# Patient Record
Sex: Female | Born: 1950 | Race: White | Hispanic: No | Smoking: Never smoker
Health system: Southern US, Community
[De-identification: ages and names within clinical notes are randomized; demographics above are authoritative.]

## PROBLEM LIST (undated history)

## (undated) DIAGNOSIS — E119 Type 2 diabetes mellitus without complications: Secondary | ICD-10-CM

## (undated) DIAGNOSIS — Z8669 Personal history of other diseases of the nervous system and sense organs: Secondary | ICD-10-CM

## (undated) DIAGNOSIS — K219 Gastro-esophageal reflux disease without esophagitis: Secondary | ICD-10-CM

## (undated) DIAGNOSIS — I10 Essential (primary) hypertension: Secondary | ICD-10-CM

## (undated) DIAGNOSIS — Z8709 Personal history of other diseases of the respiratory system: Secondary | ICD-10-CM

## (undated) DIAGNOSIS — F419 Anxiety disorder, unspecified: Secondary | ICD-10-CM

## (undated) DIAGNOSIS — E039 Hypothyroidism, unspecified: Secondary | ICD-10-CM

## (undated) DIAGNOSIS — G4730 Sleep apnea, unspecified: Secondary | ICD-10-CM

## (undated) DIAGNOSIS — M2540 Effusion, unspecified joint: Secondary | ICD-10-CM

## (undated) DIAGNOSIS — G629 Polyneuropathy, unspecified: Secondary | ICD-10-CM

## (undated) DIAGNOSIS — J302 Other seasonal allergic rhinitis: Secondary | ICD-10-CM

## (undated) DIAGNOSIS — R251 Tremor, unspecified: Secondary | ICD-10-CM

## (undated) DIAGNOSIS — R0600 Dyspnea, unspecified: Secondary | ICD-10-CM

## (undated) DIAGNOSIS — Z8719 Personal history of other diseases of the digestive system: Secondary | ICD-10-CM

## (undated) DIAGNOSIS — R32 Unspecified urinary incontinence: Secondary | ICD-10-CM

## (undated) DIAGNOSIS — R112 Nausea with vomiting, unspecified: Secondary | ICD-10-CM

## (undated) DIAGNOSIS — Z950 Presence of cardiac pacemaker: Secondary | ICD-10-CM

## (undated) DIAGNOSIS — G2581 Restless legs syndrome: Secondary | ICD-10-CM

## (undated) DIAGNOSIS — E785 Hyperlipidemia, unspecified: Secondary | ICD-10-CM

## (undated) DIAGNOSIS — C4491 Basal cell carcinoma of skin, unspecified: Secondary | ICD-10-CM

## (undated) DIAGNOSIS — Z9889 Other specified postprocedural states: Secondary | ICD-10-CM

## (undated) DIAGNOSIS — Z87442 Personal history of urinary calculi: Secondary | ICD-10-CM

## (undated) DIAGNOSIS — M1990 Unspecified osteoarthritis, unspecified site: Secondary | ICD-10-CM

## (undated) DIAGNOSIS — I5033 Acute on chronic diastolic (congestive) heart failure: Secondary | ICD-10-CM

## (undated) DIAGNOSIS — J45909 Unspecified asthma, uncomplicated: Secondary | ICD-10-CM

## (undated) HISTORY — DX: Basal cell carcinoma of skin, unspecified: C44.91

## (undated) HISTORY — DX: Polyneuropathy, unspecified: G62.9

## (undated) HISTORY — PX: BASAL CELL CARCINOMA EXCISION: SHX1214

## (undated) HISTORY — PX: CARPAL TUNNEL RELEASE: SHX101

## (undated) HISTORY — DX: Restless legs syndrome: G25.81

## (undated) HISTORY — DX: Hyperlipidemia, unspecified: E78.5

## (undated) HISTORY — DX: Anxiety disorder, unspecified: F41.9

## (undated) HISTORY — PX: ACHILLES TENDON REPAIR: SUR1153

## (undated) HISTORY — DX: Hypothyroidism, unspecified: E03.9

## (undated) HISTORY — DX: Type 2 diabetes mellitus without complications: E11.9

## (undated) HISTORY — PX: KNEE ARTHROSCOPY: SUR90

## (undated) HISTORY — DX: Tremor, unspecified: R25.1

## (undated) HISTORY — PX: DILATION AND CURETTAGE OF UTERUS: SHX78

## (undated) HISTORY — DX: Sleep apnea, unspecified: G47.30

## (undated) HISTORY — PX: CARDIAC CATHETERIZATION: SHX172

## (undated) HISTORY — DX: Unspecified urinary incontinence: R32

## (undated) HISTORY — PX: APPENDECTOMY: SHX54

## (undated) HISTORY — PX: BREAST LUMPECTOMY: SHX2

## (undated) HISTORY — DX: Essential (primary) hypertension: I10

## (undated) HISTORY — PX: EYE SURGERY: SHX253

## (undated) HISTORY — PX: TOTAL ABDOMINAL HYSTERECTOMY: SHX209

---

## 1998-02-14 ENCOUNTER — Ambulatory Visit (HOSPITAL_COMMUNITY): Admission: RE | Admit: 1998-02-14 | Discharge: 1998-02-14 | Payer: Self-pay | Admitting: Cardiology

## 1999-03-21 ENCOUNTER — Other Ambulatory Visit: Admission: RE | Admit: 1999-03-21 | Discharge: 1999-03-21 | Payer: Self-pay | Admitting: Obstetrics and Gynecology

## 1999-03-24 ENCOUNTER — Other Ambulatory Visit: Admission: RE | Admit: 1999-03-24 | Discharge: 1999-03-24 | Payer: Self-pay | Admitting: Obstetrics and Gynecology

## 1999-05-14 ENCOUNTER — Inpatient Hospital Stay (HOSPITAL_COMMUNITY): Admission: RE | Admit: 1999-05-14 | Discharge: 1999-05-17 | Payer: Self-pay | Admitting: Obstetrics and Gynecology

## 1999-09-25 ENCOUNTER — Encounter: Payer: Self-pay | Admitting: *Deleted

## 1999-09-25 ENCOUNTER — Ambulatory Visit (HOSPITAL_COMMUNITY): Admission: RE | Admit: 1999-09-25 | Discharge: 1999-09-25 | Payer: Self-pay | Admitting: *Deleted

## 2001-10-03 ENCOUNTER — Ambulatory Visit: Admission: RE | Admit: 2001-10-03 | Discharge: 2001-10-03 | Payer: Self-pay | Admitting: Orthopedic Surgery

## 2011-10-20 HISTORY — PX: LITHOTRIPSY: SUR834

## 2015-01-16 ENCOUNTER — Encounter: Payer: Self-pay | Admitting: Diagnostic Neuroimaging

## 2015-01-16 ENCOUNTER — Ambulatory Visit (INDEPENDENT_AMBULATORY_CARE_PROVIDER_SITE_OTHER): Payer: PPO | Admitting: Diagnostic Neuroimaging

## 2015-01-16 VITALS — BP 120/61 | HR 69 | Temp 98.0°F | Resp 18

## 2015-01-16 DIAGNOSIS — G250 Essential tremor: Secondary | ICD-10-CM | POA: Diagnosis not present

## 2015-01-16 MED ORDER — GABAPENTIN 400 MG PO CAPS: 400.0000 mg | ORAL_CAPSULE | Freq: Three times a day (TID) | ORAL | Status: DC

## 2015-01-16 NOTE — Patient Instructions (Signed)
Increase gabapentin tup to 800mg  three times per day.

## 2015-01-16 NOTE — Progress Notes (Signed)
[facility]  PATIENT: Linda Washington DOB: 1950/11/26  REFERRING CLINICIAN: Wilhemena Durie, PA HISTORY FROM: patient and roommate REASON FOR VISIT: new consult    HISTORICAL  CHIEF COMPLAINT:  Chief Complaint  Patient presents with  . Tremors    Rm 6 New patient     HISTORY OF PRESENT ILLNESS:   64 year old right-handed female here for valuation of tremor. He has history of hypertension, diabetes, hypercholesterolemia, anxiety. Patient referred for consultation by Wilhemena Durie, PA.  Patient reports onset of gradually progressive hand tremor, symmetric, since 2009. Symptoms mainly affect her when she is holding her arms out or doing fine movements with her fingers. No resting tremor. No trouble with cups, spoons or other utensils. Her handwriting has deteriorated. No speech or swallowing difficulties, balance or gait difficulties, constipation or REM behavior sleep problems. She has history of sleep apnea diagnosed several years ago, is not able to tolerate CPAP.   No family history of tremor. No specific triggering or aggravating factors. Wine may slightly improve tremor.   REVIEW OF SYSTEMS: Full 14 system review of systems performed and notable only foranxiety not asleep decreased energy tremor snoring easy bruising urination problems moles cramps joint pain.  ALLERGIES: Allergies  Allergen Reactions  . Latex Rash    HOME MEDICATIONS: Outpatient Prescriptions Prior to Visit  Medication Sig Dispense Refill  . aspirin EC 81 MG tablet Take 81 mg by mouth daily.    . citalopram (CELEXA) 10 MG tablet Take 10 mg by mouth daily.    . enalapril (VASOTEC) 10 MG tablet Take 10 mg by mouth daily.    Marland Kitchen glimepiride (AMARYL) 1 MG tablet Take 1 mg by mouth daily with breakfast.    . loratadine (CLARITIN) 10 MG tablet Take 10 mg by mouth daily.    . metformin (FORTAMET) 1000 MG (OSM) 24 hr tablet Take 1,000 mg by mouth 2 (two) times daily.    . naproxen sodium (ANAPROX) 220  MG tablet Take 220 mg by mouth 2 (two) times daily as needed.    Marland Kitchen omeprazole (PRILOSEC) 20 MG capsule Take 20 mg by mouth daily.    Marland Kitchen oxybutynin (DITROPAN-XL) 10 MG 24 hr tablet Take 10 mg by mouth at bedtime.    . simvastatin (ZOCOR) 40 MG tablet Take 40 mg by mouth daily.    Marland Kitchen gabapentin (NEURONTIN) 400 MG capsule Take 400 mg by mouth 3 (three) times daily.     No facility-administered medications prior to visit.    PAST MEDICAL HISTORY: Past Medical History  Diagnosis Date  . Tremor   . Incontinence   . Type 2 diabetes mellitus   . Hypertension   . Anxiety   . Neuropathy   . RLS (restless legs syndrome)   . Sleep apnea   . Basal cell carcinoma   . Hypothyroidism   . Hyperlipemia     PAST SURGICAL HISTORY: Past Surgical History  Procedure Laterality Date  . Basal cell carcinoma excision      Nose  . Carpal tunnel release    . Breast lumpectomy    . Knee arthroscopy      Right  . Total abdominal hysterectomy      FAMILY HISTORY: Family History  Problem Relation Age of Onset  . Heart disease Mother   . Heart disease Father     SOCIAL HISTORY:  History   Social History  . Marital Status: Unknown    Spouse Name: N/A  . Number of Children:  N/A  . Years of Education: Some colle   Occupational History  . Retired    Social History Main Topics  . Smoking status: Never Smoker   . Smokeless tobacco: Not on file  . Alcohol Use: 0.0 oz/week    0 Standard drinks or equivalent per week     Comment: Rare-wine  . Drug Use: No  . Sexual Activity: Not on file   Other Topics Concern  . Not on file   Social History Narrative   Coffee, tea, soda use     PHYSICAL EXAM  Filed Vitals:   01/16/15 1432  BP: 120/61  Pulse: 69  Temp: 98 F (36.7 C)  TempSrc: Oral  Resp: 18    There is no height or weight on file to calculate BMI.  No exam data present  No flowsheet data found.   GENERAL EXAM: Patient is in no distress; well developed, nourished and  groomed; neck is supple  CARDIOVASCULAR: Regular rate and rhythm, no murmurs, no carotid bruits  NEUROLOGIC: MENTAL STATUS: awake, alert, oriented to person, place and time, recent and remote memory intact, normal attention and concentration, language fluent, comprehension intact, naming intact, fund of knowledge appropriate CRANIAL NERVE: no papilledema on fundoscopic exam, pupils equal and reactive to light, visual fields full to confrontation, extraocular muscles intact, no nystagmus, facial sensation and strength symmetric, hearing intact, palate elevates symmetrically, uvula midline, shoulder shrug symmetric, tongue midline. MOTOR: normal bulk and tone, MILD POSTURAL AND ACTION TREMOR IN BUE; full strength in the BUE, BLE SENSORY: normal and symmetric to light touch; DECR PP AND VIB IN FEET COORDINATION: finger-nose-finger, fine finger movements normal REFLEXES: deep tendon reflexes present and symmetric; ABSENT AT ANKLES GAIT/STATION: narrow based gait; able to walk tandem; romberg is negative    DIAGNOSTIC DATA (LABS, IMAGING, TESTING) - I reviewed patient records, labs, notes, testing and imaging myself where available.  No results found for: WBC, HGB, HCT, MCV, PLT No results found for: NA, K, CL, CO2, GLUCOSE, BUN, CREATININE, CALCIUM, PROT, ALBUMIN, AST, ALT, ALKPHOS, BILITOT, GFRNONAA, GFRAA  No results found for: CHOL, HDL, LDLCALC, LDLDIRECT, TRIG, CHOLHDL No results found for: HGBA1C No results found for: VITAMINB12 No results found for: TSH  Patient reports normal TSH testing in March 2016.     ASSESSMENT AND PLAN  64 y.o. year old female here with gradual onset progressive tremor, postural and action, since 2009. Most likely represents essential tremor.  PLAN: - Increase gabapentin up to 800 mg 3 times per day. If this does not work, we may consider primidone or propranolol  Meds ordered this encounter  Medications  . gabapentin (NEURONTIN) 400 MG capsule     Sig: Take 1-2 capsules (400-800 mg total) by mouth 3 (three) times daily.    Dispense:  180 capsule    Refill:  6    Return in about 2 months (around 03/18/2015).    Penni Bombard, MD 4/66/5993, 5:70 PM Certified in Neurology, Neurophysiology and Neuroimaging  [facility] [address] [place], [place] [address] [phone]

## 2015-02-26 ENCOUNTER — Encounter: Payer: Self-pay | Admitting: Diagnostic Neuroimaging

## 2015-02-26 ENCOUNTER — Ambulatory Visit (INDEPENDENT_AMBULATORY_CARE_PROVIDER_SITE_OTHER): Payer: PPO | Admitting: Diagnostic Neuroimaging

## 2015-02-26 VITALS — BP 143/70 | HR 67 | Ht 68.0 in | Wt 267.2 lb

## 2015-02-26 DIAGNOSIS — G250 Essential tremor: Secondary | ICD-10-CM

## 2015-02-26 NOTE — Progress Notes (Signed)
[facility]  PATIENT: Linda Washington DOB: [birth year]  REFERRING CLINICIAN: Wilhemena Durie, PA HISTORY FROM: patient   REASON FOR VISIT: follow up   HISTORICAL  CHIEF COMPLAINT:  Chief Complaint  Patient presents with  . Follow-up    essential tremor    HISTORY OF PRESENT ILLNESS:   UPDATE 02/26/15: Since last visit, doing much better. Tremor resolved with slightly higher gabapentin (400/400/800). Separately, will need left knee replacement surgery soon due to pain and arthritis.  PRIOR HPI (01/16/15, VRP): 64 year old right-handed female here for valuation of tremor. He has history of hypertension, diabetes, hypercholesterolemia, anxiety. Patient referred for consultation by Wilhemena Durie, PA. Patient reports onset of gradually progressive hand tremor, symmetric, since 2009. Symptoms mainly affect her when she is holding her arms out or doing fine movements with her fingers. No resting tremor. No trouble with cups, spoons or other utensils. Her handwriting has deteriorated. No speech or swallowing difficulties, balance or gait difficulties, constipation or REM behavior sleep problems. She has history of sleep apnea diagnosed several years ago, is not able to tolerate CPAP. No family history of tremor. No specific triggering or aggravating factors. Wine may slightly improve tremor.   REVIEW OF SYSTEMS: Full 14 system review of systems performed and notable only for activity change exces sweating joint pain leg swelling apnea restless legs snoring.    ALLERGIES: Allergies  Allergen Reactions  . Latex Rash    HOME MEDICATIONS: Outpatient Prescriptions Prior to Visit  Medication Sig Dispense Refill  . aspirin EC 81 MG tablet Take 81 mg by mouth daily.    . citalopram (CELEXA) 10 MG tablet Take 10 mg by mouth daily.    . enalapril (VASOTEC) 10 MG tablet Take 10 mg by mouth daily.    Marland Kitchen gabapentin (NEURONTIN) 400 MG capsule Take 1-2 capsules (400-800 mg total) by mouth 3  (three) times daily. 180 capsule 6  . glimepiride (AMARYL) 1 MG tablet Take 1 mg by mouth daily with breakfast.    . loratadine (CLARITIN) 10 MG tablet Take 10 mg by mouth daily.    . metformin (FORTAMET) 1000 MG (OSM) 24 hr tablet Take 1,000 mg by mouth 2 (two) times daily.    . naproxen sodium (ANAPROX) 220 MG tablet Take 220 mg by mouth 2 (two) times daily as needed.    Marland Kitchen omeprazole (PRILOSEC) 20 MG capsule Take 20 mg by mouth daily.    Marland Kitchen oxybutynin (DITROPAN-XL) 10 MG 24 hr tablet Take 10 mg by mouth at bedtime.    . simvastatin (ZOCOR) 40 MG tablet Take 40 mg by mouth daily.     No facility-administered medications prior to visit.    PAST MEDICAL HISTORY: Past Medical History  Diagnosis Date  . Tremor   . Incontinence   . Type 2 diabetes mellitus   . Hypertension   . Anxiety   . Neuropathy   . RLS (restless legs syndrome)   . Sleep apnea   . Basal cell carcinoma   . Hypothyroidism   . Hyperlipemia     PAST SURGICAL HISTORY: Past Surgical History  Procedure Laterality Date  . Basal cell carcinoma excision      Nose  . Carpal tunnel release    . Breast lumpectomy    . Knee arthroscopy      Right  . Total abdominal hysterectomy      FAMILY HISTORY: Family History  Problem Relation Age of Onset  . Heart disease Mother   . Heart  disease Father     SOCIAL HISTORY:  History   Social History  . Marital Status: Unknown    Spouse Name: N/A  . Number of Children: N/A  . Years of Education: Some colle   Occupational History  . Retired    Social History Main Topics  . Smoking status: Never Smoker   . Smokeless tobacco: Not on file  . Alcohol Use: 0.0 oz/week    0 Standard drinks or equivalent per week     Comment: Rare-wine  . Drug Use: No  . Sexual Activity: Not on file   Other Topics Concern  . Not on file   Social History Narrative   Lives at home with Lonn Georgia, roommate   Caffeine use: 1 Coffee day, 1-2 a day of tea, 1-2 sodas a day       PHYSICAL EXAM  Filed Vitals:   02/26/15 1459  BP: 143/70  Pulse: 67  Height: 5\' 8"  (1.727 m)  Weight: 267 lb 3.2 oz (121.201 kg)    Body mass index is 40.64 kg/(m^2).  No exam data present  No flowsheet data found.   GENERAL EXAM: Patient is in no distress; well developed, nourished and groomed; neck is supple  CARDIOVASCULAR: Regular rate and rhythm, no murmurs, no carotid bruits  NEUROLOGIC: MENTAL STATUS: awake, alert, language fluent, comprehension intact, naming intact, fund of knowledge appropriate CRANIAL NERVE: pupils equal and reactive to light, visual fields full to confrontation, extraocular muscles intact, no nystagmus, facial sensation and strength symmetric, hearing intact, palate elevates symmetrically, uvula midline, shoulder shrug symmetric, tongue midline. MOTOR: normal bulk and tone, NO TREMOR; full strength in the BUE, BLE SENSORY: normal and symmetric to light touch; DECR PP AND VIB IN FEET COORDINATION: finger-nose-finger, fine finger movements normal REFLEXES: deep tendon reflexes present and symmetric; ABSENT AT ANKLES GAIT/STATION: narrow based gait; LIMPING IN LEFT KNEE    DIAGNOSTIC DATA (LABS, IMAGING, TESTING) - I reviewed patient records, labs, notes, testing and imaging myself where available.  No results found for: WBC, HGB, HCT, MCV, PLT No results found for: NA, K, CL, CO2, GLUCOSE, BUN, CREATININE, CALCIUM, PROT, ALBUMIN, AST, ALT, ALKPHOS, BILITOT, GFRNONAA, GFRAA  No results found for: CHOL, HDL, LDLCALC, LDLDIRECT, TRIG, CHOLHDL No results found for: HGBA1C No results found for: VITAMINB12 No results found for: TSH  Patient reports normal TSH testing in March 2016.     ASSESSMENT AND PLAN  64 y.o. year old female here with gradual onset progressive tremor, postural and action, since 2009. Most likely represents essential tremor. Doing well on slightly higher gabapentin.   PLAN: - continue gabapentin 400-800 mg 3 times  per day. If this does not work, we may consider primidone or propranolol - follow up as needed   Return if symptoms worsen or fail to improve, for return to PCP.    Penni Bombard, MD 0/53/9767, 3:41 PM Certified in Neurology, Neurophysiology and Neuroimaging  [facility] [address] [place], [place] [address] [phone]

## 2015-02-26 NOTE — Patient Instructions (Signed)
Continue gabapentin.

## 2015-03-11 ENCOUNTER — Other Ambulatory Visit: Payer: Self-pay | Admitting: Orthopedic Surgery

## 2015-03-28 ENCOUNTER — Ambulatory Visit (HOSPITAL_COMMUNITY)
Admission: RE | Admit: 2015-03-28 | Discharge: 2015-03-28 | Disposition: A | Discharge status: Home / Self Care | Payer: PPO | Source: Ambulatory Visit | Attending: Orthopedic Surgery | Admitting: Orthopedic Surgery

## 2015-03-28 ENCOUNTER — Encounter (HOSPITAL_COMMUNITY)
Admission: RE | Admit: 2015-03-28 | Discharge: 2015-03-28 | Disposition: A | Discharge status: Home / Self Care | Payer: PPO | Source: Ambulatory Visit | Attending: Orthopedic Surgery | Admitting: Orthopedic Surgery

## 2015-03-28 ENCOUNTER — Encounter (HOSPITAL_COMMUNITY): Payer: Self-pay

## 2015-03-28 DIAGNOSIS — E785 Hyperlipidemia, unspecified: Secondary | ICD-10-CM | POA: Diagnosis not present

## 2015-03-28 DIAGNOSIS — I4892 Unspecified atrial flutter: Secondary | ICD-10-CM | POA: Diagnosis not present

## 2015-03-28 DIAGNOSIS — M179 Osteoarthritis of knee, unspecified: Secondary | ICD-10-CM | POA: Diagnosis not present

## 2015-03-28 DIAGNOSIS — E039 Hypothyroidism, unspecified: Secondary | ICD-10-CM | POA: Diagnosis not present

## 2015-03-28 DIAGNOSIS — Z01818 Encounter for other preprocedural examination: Secondary | ICD-10-CM | POA: Insufficient documentation

## 2015-03-28 DIAGNOSIS — I4430 Unspecified atrioventricular block: Secondary | ICD-10-CM | POA: Insufficient documentation

## 2015-03-28 DIAGNOSIS — K219 Gastro-esophageal reflux disease without esophagitis: Secondary | ICD-10-CM | POA: Diagnosis not present

## 2015-03-28 DIAGNOSIS — Z0183 Encounter for blood typing: Secondary | ICD-10-CM | POA: Diagnosis not present

## 2015-03-28 DIAGNOSIS — G4733 Obstructive sleep apnea (adult) (pediatric): Secondary | ICD-10-CM | POA: Diagnosis not present

## 2015-03-28 DIAGNOSIS — I10 Essential (primary) hypertension: Secondary | ICD-10-CM | POA: Insufficient documentation

## 2015-03-28 DIAGNOSIS — E119 Type 2 diabetes mellitus without complications: Secondary | ICD-10-CM | POA: Insufficient documentation

## 2015-03-28 DIAGNOSIS — Z01812 Encounter for preprocedural laboratory examination: Secondary | ICD-10-CM | POA: Diagnosis not present

## 2015-03-28 HISTORY — DX: Nausea with vomiting, unspecified: Z98.890

## 2015-03-28 HISTORY — DX: Effusion, unspecified joint: M25.40

## 2015-03-28 HISTORY — DX: Personal history of other diseases of the digestive system: Z87.19

## 2015-03-28 HISTORY — DX: Gastro-esophageal reflux disease without esophagitis: K21.9

## 2015-03-28 HISTORY — DX: Unspecified osteoarthritis, unspecified site: M19.90

## 2015-03-28 HISTORY — DX: Personal history of other diseases of the nervous system and sense organs: Z86.69

## 2015-03-28 HISTORY — DX: Nausea with vomiting, unspecified: R11.2

## 2015-03-28 HISTORY — DX: Personal history of urinary calculi: Z87.442

## 2015-03-28 HISTORY — DX: Other specified postprocedural states: Z98.890

## 2015-03-28 HISTORY — DX: Other seasonal allergic rhinitis: J30.2

## 2015-03-28 HISTORY — DX: Personal history of other diseases of the respiratory system: Z87.09

## 2015-03-28 LAB — URINE MICROSCOPIC-ADD ON

## 2015-03-28 LAB — CBC WITH DIFFERENTIAL/PLATELET
BASOS ABS: 0 10*3/uL (ref 0.0–0.1)
Basophils Relative: 0 % (ref 0–1)
Eosinophils Absolute: 0.3 10*3/uL (ref 0.0–0.7)
Eosinophils Relative: 3 % (ref 0–5)
HEMATOCRIT: 43.8 % (ref 36.0–46.0)
Hemoglobin: 14.2 g/dL (ref 12.0–15.0)
LYMPHS ABS: 2.8 10*3/uL (ref 0.7–4.0)
Lymphocytes Relative: 27 % (ref 12–46)
MCH: 29.5 pg (ref 26.0–34.0)
MCHC: 32.4 g/dL (ref 30.0–36.0)
MCV: 91.1 fL (ref 78.0–100.0)
MONO ABS: 0.7 10*3/uL (ref 0.1–1.0)
MONOS PCT: 7 % (ref 3–12)
NEUTROS PCT: 63 % (ref 43–77)
Neutro Abs: 6.6 10*3/uL (ref 1.7–7.7)
Platelets: 423 10*3/uL — ABNORMAL HIGH (ref 150–400)
RBC: 4.81 MIL/uL (ref 3.87–5.11)
RDW: 13.7 % (ref 11.5–15.5)
WBC: 10.4 10*3/uL (ref 4.0–10.5)

## 2015-03-28 LAB — BASIC METABOLIC PANEL
Anion gap: 11 (ref 5–15)
BUN: 19 mg/dL (ref 6–20)
CALCIUM: 9.3 mg/dL (ref 8.9–10.3)
CHLORIDE: 104 mmol/L (ref 101–111)
CO2: 23 mmol/L (ref 22–32)
Creatinine, Ser: 0.9 mg/dL (ref 0.44–1.00)
GFR calc Af Amer: >60 mL/min (ref >60)
GFR calc non Af Amer: >60 mL/min (ref >60)
Glucose, Bld: 87 mg/dL (ref 65–99)
Potassium: 4.3 mmol/L (ref 3.5–5.1)
Sodium: 138 mmol/L (ref 135–145)

## 2015-03-28 LAB — SURGICAL PCR SCREEN
MRSA, PCR: NEGATIVE
Staphylococcus aureus: NEGATIVE

## 2015-03-28 LAB — URINALYSIS, ROUTINE W REFLEX MICROSCOPIC
BILIRUBIN URINE: NEGATIVE
Glucose, UA: NEGATIVE mg/dL
Ketones, ur: NEGATIVE mg/dL
Leukocytes, UA: NEGATIVE
Nitrite: NEGATIVE
PH: 5 (ref 5.0–8.0)
PROTEIN: NEGATIVE mg/dL
Specific Gravity, Urine: 1.024 (ref 1.005–1.030)
UROBILINOGEN UA: 0.2 mg/dL (ref 0.0–1.0)

## 2015-03-28 LAB — TYPE AND SCREEN
ABO/RH(D): A POS
ANTIBODY SCREEN: NEGATIVE

## 2015-03-28 LAB — GLUCOSE, CAPILLARY: GLUCOSE-CAPILLARY: 80 mg/dL (ref 65–99)

## 2015-03-28 LAB — PROTIME-INR
INR: 1.03 (ref 0.00–1.49)
Prothrombin Time: 13.7 seconds (ref 11.6–15.2)

## 2015-03-28 LAB — APTT: APTT: 31 s (ref 24–37)

## 2015-03-28 NOTE — Pre-Procedure Instructions (Signed)
Linda Washington  03/28/2015      [facility] [address], [place] - [address] [place] [address] Phone: [phone] Fax: [phone]    Your procedure is scheduled on Mon, June 20 @ 9:30 AM  Report to [facility] Admitting at 7:30 AM  Call this number if you have problems the morning of surgery:  [phone]   Remember:  Do not eat food or drink liquids after midnight.  Take these medicines the morning of surgery with A SIP OF WATER:Citalopram(Celexa),Gabapentin(Neurontin),Claritin(Loratadine),Omeprazole(Prilosec),Ditropan(Oxybutynin),and Tramadol(Ultram)               Stop taking your Aleve and Aspirin a week before surgery. No Goody's,BC's,Ibuprofen,Fish Oil,or any Herbal Medications.    Do not wear jewelry, make-up or nail polish.  Do not wear lotions, powders, or perfumes.  You may wear deodorant.  Do not shave 48 hours prior to surgery.    Do not bring valuables to the hospital.  [facility] is not responsible for any belongings or valuables.  Contacts, dentures or bridgework may not be worn into surgery.  Leave your suitcase in the car.  After surgery it may be brought to your room.  For patients admitted to the hospital, discharge time will be determined by your treatment team.  Patients discharged the day of surgery will not be allowed to drive home.    Special instructions:  [facility] - Preparing for Surgery  Before surgery, you can play an important role.  Because skin is not sterile, your skin needs to be as free of germs as possible.  You can reduce the number of germs on you skin by washing with CHG (chlorahexidine gluconate) soap before surgery.  CHG is an antiseptic cleaner which kills germs and bonds with the skin to continue killing germs even after washing.  Please DO NOT use if you have an allergy to CHG or antibacterial soaps.  If your skin becomes reddened/irritated stop using the CHG and inform  your nurse when you arrive at Short Stay.  Do not shave (including legs and underarms) for at least 48 hours prior to the first CHG shower.  You may shave your face.  Please follow these instructions carefully:   1.  Shower with CHG Soap the night before surgery and the                                morning of Surgery.  2.  If you choose to wash your hair, wash your hair first as usual with your       normal shampoo.  3.  After you shampoo, rinse your hair and body thoroughly to remove the                      Shampoo.  4.  Use CHG as you would any other liquid soap.  You can apply chg directly       to the skin and wash gently with scrungie or a clean washcloth.  5.  Apply the CHG Soap to your body ONLY FROM THE NECK DOWN.        Do not use on open wounds or open sores.  Avoid contact with your eyes,       ears, mouth and genitals (private parts).  Wash genitals (private parts)       with your normal soap.  6.  Wash thoroughly, paying special attention to the area where your surgery        will be performed.  7.  Thoroughly rinse your body with warm water from the neck down.  8.  DO NOT shower/wash with your normal soap after using and rinsing off       the CHG Soap.  9.  Pat yourself dry with a clean towel.            10.  Wear clean pajamas.            11.  Place clean sheets on your bed the night of your first shower and do not        sleep with pets.  Day of Surgery  Do not apply any lotions/deoderants the morning of surgery.  Please wear clean clothes to the hospital/surgery center.    Please read over the following fact sheets that you were given. Pain Booklet, Coughing and Deep Breathing, Blood Transfusion Information, MRSA Information and Surgical Site Infection Prevention

## 2015-03-28 NOTE — Progress Notes (Addendum)
PCP- [facility] [address]  Cardiologist- Pt. Denies having a cardiologist in the past 5 years  Echo/Stress Test/Card cath- denies within the past 5 years  EKG- denies EKG in past year; abnormal EKG on 03/28/2015 - sent chart to [facility] for review.   CXR- denies CXR in past year  A1C and previous EKG- requested from [facility] on [address]

## 2015-03-28 NOTE — Progress Notes (Addendum)
Anesthesia Chart Review:  Pt is 64 year old female scheduled for L total knee arthroplasty on 04/08/2015 with Dr. Mayer Camel.   PMH includes: HTN, DM, OSA (does not use CPAP), hyperlipidemia, hypothyroidism, GERD, hx Bell's palsy. Never smoker. BMI 41.   Medications include: ASA, enalapril, glimepriride, metformin, prilosec, zocor  Preoperative labs reviewed.    Chest x-ray 03/28/2015 reviewed. No acute cardiopulmonary process.   EKG 03/28/2015: Atrial flutter with variable A-V block.  Notified Kathy in Dr. Damita Dunnings office that pt will need cardiac evaluation for new onset atrial flutter.   Willeen Cass, FNP-BC [facility]/Anesthesiology Phone: [phone] 03/28/2015 4:24 PM  Addendum: I received a phone call from Roderic Palau, NP with [facility].  She has seen patient twice. Patient is back in SR. Echo was done showing a normal LVEF.  A PFO could not be excluded.  She discussed with cardiologist Dr. Rayann Heman who felt this could be evaluated at a later date if felt necessary. Patient was cleared with low risk. If she has aflutter on the day of surgery, cardiology did not think this should cause concern to cancel surgery as long as patient was rate controlled. Cardiology is recommending post-operative anticoagulation due to a CHADVASC score of at least 4.  Patient will be seen in the afib clinic next month with plans to get her established with primary cardiology in 05/2015 (Dr. Marlou Porch).  04/02/15 Echo: - Left ventricle: The cavity size was normal. Systolic function was normal. The estimated ejection fraction was in the range of 55% to 60%. Wall motion was normal; there were no regional wall motion abnormalities. - Aortic valve: There was mild regurgitation. - Mitral valve: There was trivial regurgitation. - Left atrium: The atrium was moderately dilated. - Atrial septum: A patent foramen ovale cannot be excluded. - Tricuspid valve: There was trivial  regurgitation. - Recommendations: Consider agitated saline contrast study to rule out PFO.  [facility]/Anesthesiology Phone [phone] 04/04/2015 3:22 PM

## 2015-03-29 NOTE — H&P (Signed)
TOTAL KNEE ADMISSION H&P  Patient is being admitted for left total knee arthroplasty.  Subjective:  Chief Complaint:left knee pain.  HPI: Linda Washington, 64 y.o. female, has a history of pain and functional disability in the left knee due to arthritis and has failed non-surgical conservative treatments for greater than 12 weeks to includeNSAID's and/or analgesics, corticosteriod injections, viscosupplementation injections, flexibility and strengthening excercises, use of assistive devices, weight reduction as appropriate and activity modification.  Onset of symptoms was gradual, starting 2 years ago with gradually worsening course since that time. The patient noted no past surgery on the left knee(s).  Patient currently rates pain in the left knee(s) at 10 out of 10 with activity. Patient has night pain, worsening of pain with activity and weight bearing, pain that interferes with activities of daily living, pain with passive range of motion, crepitus and joint swelling.  Patient has evidence of subchondral sclerosis, periarticular osteophytes and joint space narrowing by imaging studies.  There is no active infection.  There are no active problems to display for this patient.  Past Medical History  Diagnosis Date  . Tremor   . Incontinence     takes Ditropan daily  . Neuropathy     takes Gabapentin daily  . RLS (restless legs syndrome)   . Basal cell carcinoma   . Hypothyroidism   . Hyperlipemia     takes Simvastatin daily  . Anxiety     takes Citalopram daily  . GERD (gastroesophageal reflux disease)     takes Omeprazole daily  . Hypertension     takes Enalapril daily  . Type 2 diabetes mellitus     takes Metformin and Amaryl daily  . Seasonal allergies     takes Claritin daioy as needed  . PONV (postoperative nausea and vomiting)   . History of Bell's palsy     left side  . History of bronchitis   . History of hiatal hernia   . Arthritis     generalized  . Joint swelling     left knee  . History of kidney stones   . Sleep apnea     pt. states that she does not have a CPAP    Past Surgical History  Procedure Laterality Date  . Basal cell carcinoma excision      Nose  . Carpal tunnel release Bilateral   . Breast lumpectomy    . Knee arthroscopy      Right  . Total abdominal hysterectomy    . Cardiac catheterization      pt. states approximately 15 years ago  . Eye surgery Bilateral     cataract surgery  . Appendectomy    . Dilation and curettage of uterus    . Achilles tendon repair Right   . Lithotripsy  2013    No prescriptions prior to admission   Allergies  Allergen Reactions  . Hydrocodone     Weird sensation  . Oxycodone     Weird sensation  . Latex Rash    History  Substance Use Topics  . Smoking status: Never Smoker   . Smokeless tobacco: Not on file  . Alcohol Use: 0.0 oz/week    0 Standard drinks or equivalent per week     Comment: Rare-wine    Family History  Problem Relation Age of Onset  . Heart disease Mother   . Heart disease Father      Review of Systems  Constitutional: Positive for malaise/fatigue and diaphoresis.  HENT: Positive for congestion.   Eyes: Negative.   Cardiovascular: Negative.   Gastrointestinal: Positive for heartburn.  Genitourinary: Positive for frequency.  Musculoskeletal: Positive for myalgias and joint pain.  Skin: Negative.   Neurological: Positive for weakness.  Endo/Heme/Allergies: Bruises/bleeds easily.  Psychiatric/Behavioral: Positive for memory loss.    Objective:  Physical Exam  Constitutional: She is oriented to person, place, and time. She appears well-developed and well-nourished.  HENT:  Head: Normocephalic and atraumatic.  Eyes: Pupils are equal, round, and reactive to light.  Neck: Normal range of motion. Neck supple.  Cardiovascular: Intact distal pulses.   Respiratory: Effort normal.  Musculoskeletal: She exhibits tenderness.  Left knee lacks 10 of full extension  quite tender along the medial joint line, 1+ effusion, crepitus as you take her through a range of motion.    Neurological: She is alert and oriented to person, place, and time.  Skin: Skin is warm and dry.  Psychiatric: She has a normal mood and affect. Her behavior is normal. Judgment and thought content normal.    Vital signs in last 24 hours: Temp:  [97.7 F (36.5 C)] 97.7 F (36.5 C) (06/09 1318) Pulse Rate:  [75] 75 (06/09 1318) Resp:  [18] 18 (06/09 1318) BP: (112)/(52) 112/52 mmHg (06/09 1318) SpO2:  [97 %] 97 % (06/09 1318)  Labs:   Estimated body mass index is 40.64 kg/(m^2) as calculated from the following:   Height as of 02/26/15: 5\' 8"  (1.727 m).   Weight as of 02/26/15: 121.201 kg (267 lb 3.2 oz).   Imaging Review Plain radiographs demonstrate bilateral AP weightbearing, bilateral Rosenberg, lateral and sunrise views of bilateral knees are taken and reviewed in office today.  Patient does have near compartment arthritis of bilateral knees.  Patient's left knee has near bone-on-bone arthritis.  Moderate patellofemoral arthritis bilaterally.  Assessment/Plan:  End stage arthritis, left knee   The patient history, physical examination, clinical judgment of the provider and imaging studies are consistent with end stage degenerative joint disease of the left knee(s) and total knee arthroplasty is deemed medically necessary. The treatment options including medical management, injection therapy arthroscopy and arthroplasty were discussed at length. The risks and benefits of total knee arthroplasty were presented and reviewed. The risks due to aseptic loosening, infection, stiffness, patella tracking problems, thromboembolic complications and other imponderables were discussed. The patient acknowledged the explanation, agreed to proceed with the plan and consent was signed. Patient is being admitted for inpatient treatment for surgery, pain control, PT, OT, prophylactic antibiotics,  VTE prophylaxis, progressive ambulation and ADL's and discharge planning. The patient is planning to be discharged home with home health services

## 2015-04-01 ENCOUNTER — Encounter (HOSPITAL_COMMUNITY): Payer: Self-pay | Admitting: Nurse Practitioner

## 2015-04-01 ENCOUNTER — Ambulatory Visit (HOSPITAL_COMMUNITY)
Admission: RE | Admit: 2015-04-01 | Discharge: 2015-04-01 | Disposition: A | Discharge status: Home / Self Care | Payer: PPO | Source: Ambulatory Visit | Attending: Nurse Practitioner | Admitting: Nurse Practitioner

## 2015-04-01 VITALS — BP 150/78 | HR 74 | Ht 68.0 in | Wt 262.6 lb

## 2015-04-01 DIAGNOSIS — R32 Unspecified urinary incontinence: Secondary | ICD-10-CM | POA: Insufficient documentation

## 2015-04-01 DIAGNOSIS — E039 Hypothyroidism, unspecified: Secondary | ICD-10-CM | POA: Insufficient documentation

## 2015-04-01 DIAGNOSIS — G2581 Restless legs syndrome: Secondary | ICD-10-CM | POA: Diagnosis not present

## 2015-04-01 DIAGNOSIS — E669 Obesity, unspecified: Secondary | ICD-10-CM | POA: Insufficient documentation

## 2015-04-01 DIAGNOSIS — Z7982 Long term (current) use of aspirin: Secondary | ICD-10-CM | POA: Insufficient documentation

## 2015-04-01 DIAGNOSIS — E1140 Type 2 diabetes mellitus with diabetic neuropathy, unspecified: Secondary | ICD-10-CM | POA: Insufficient documentation

## 2015-04-01 DIAGNOSIS — I4892 Unspecified atrial flutter: Secondary | ICD-10-CM | POA: Insufficient documentation

## 2015-04-01 DIAGNOSIS — E785 Hyperlipidemia, unspecified: Secondary | ICD-10-CM | POA: Insufficient documentation

## 2015-04-01 DIAGNOSIS — Z79899 Other long term (current) drug therapy: Secondary | ICD-10-CM | POA: Insufficient documentation

## 2015-04-01 DIAGNOSIS — G4730 Sleep apnea, unspecified: Secondary | ICD-10-CM | POA: Diagnosis not present

## 2015-04-01 DIAGNOSIS — Z8249 Family history of ischemic heart disease and other diseases of the circulatory system: Secondary | ICD-10-CM | POA: Diagnosis not present

## 2015-04-01 DIAGNOSIS — Z85828 Personal history of other malignant neoplasm of skin: Secondary | ICD-10-CM | POA: Diagnosis not present

## 2015-04-01 DIAGNOSIS — K219 Gastro-esophageal reflux disease without esophagitis: Secondary | ICD-10-CM | POA: Diagnosis not present

## 2015-04-01 DIAGNOSIS — I480 Paroxysmal atrial fibrillation: Secondary | ICD-10-CM

## 2015-04-01 DIAGNOSIS — I10 Essential (primary) hypertension: Secondary | ICD-10-CM | POA: Insufficient documentation

## 2015-04-01 NOTE — Progress Notes (Signed)
Patient ID: Linda Washington, female   DOB: 05/19/1951, 64 y.o.   MRN: [id number]      Primary Care Physician: Marda Stalker, PA-C Referring Physician: [facility] pre op   Linda Washington is a 64 y.o. female with a h/o  DM , HTN, sleep apnea untreated, obesity that presented to [facility] for preop for pending left knee replacement and was found to be in typical aflutter with variable av block,at 68 bpm. Pt was asymptomatic and referred to afib clinic for further evaluation. She was told 15 years ago that she had mitral valve prolapse and was on BB/antibiotics  for years. She had a cath several years ago and was told she had one small lesion (20%) and the mitral valve was normal. She also has sleep apnea but is not treated. She couldn't tolerate the mask and used nasal prongs at night but her equipment eventually got old and was not replaced.She has not been reevaluated with a new sleep study. Even though she can not exercise due to knee issues, she has been modifying her diet and has lost 40 lbs. She does not use alcohol nor smoke. She has a chads vasc score of 4(female, HTN, DM, age) and has stopped her asa for surgery that is pending next Monday. No recent echo. Today, she denies symptoms of palpitations, chest pain, shortness of breath, orthopnea, PND, lower extremity edema, dizziness, presyncope, syncope, or neurologic sequela. The patient is tolerating medications without difficulties and is otherwise without complaint today.   Past Medical History  Diagnosis Date  . Tremor   . Incontinence     takes Ditropan daily  . Neuropathy     takes Gabapentin daily  . RLS (restless legs syndrome)   . Basal cell carcinoma   . Hypothyroidism   . Hyperlipemia     takes Simvastatin daily  . Anxiety     takes Citalopram daily  . GERD (gastroesophageal reflux disease)     takes Omeprazole daily  . Hypertension     takes Enalapril daily  . Type 2 diabetes mellitus     takes Metformin and Amaryl daily  . Seasonal  allergies     takes Claritin daioy as needed  . PONV (postoperative nausea and vomiting)   . History of Bell's palsy     left side  . History of bronchitis   . History of hiatal hernia   . Arthritis     generalized  . Joint swelling     left knee  . History of kidney stones   . Sleep apnea     pt. states that she does not have a CPAP   Past Surgical History  Procedure Laterality Date  . Basal cell carcinoma excision      Nose  . Carpal tunnel release Bilateral   . Breast lumpectomy    . Knee arthroscopy      Right  . Total abdominal hysterectomy    . Cardiac catheterization      pt. states approximately 15 years ago  . Eye surgery Bilateral     cataract surgery  . Appendectomy    . Dilation and curettage of uterus    . Achilles tendon repair Right   . Lithotripsy  2013    Current Outpatient Prescriptions  Medication Sig Dispense Refill  . aspirin EC 81 MG tablet Take 81 mg by mouth daily.    . citalopram (CELEXA) 20 MG tablet Take 20 mg by mouth daily.    Marland Kitchen  enalapril (VASOTEC) 10 MG tablet Take 10 mg by mouth at bedtime.     . gabapentin (NEURONTIN) 400 MG capsule Take 1-2 capsules (400-800 mg total) by mouth 3 (three) times daily. (Patient taking differently: Take 400-800 mg by mouth 3 (three) times daily. Take 400 mg in the morning and afternoon and take 800 mg in the evening) 180 capsule 6  . glimepiride (AMARYL) 1 MG tablet Take 1 mg by mouth daily with breakfast.    . loratadine (CLARITIN) 10 MG tablet Take 10 mg by mouth daily as needed for allergies or rhinitis.     . metFORMIN (GLUCOPHAGE) 1000 MG tablet Take 1,000 mg by mouth 2 (two) times daily with a meal.    . naproxen sodium (ANAPROX) 220 MG tablet Take 220 mg by mouth 2 (two) times daily as needed (pain).     Marland Kitchen omeprazole (PRILOSEC) 40 MG capsule Take 40 mg by mouth daily.    Marland Kitchen oxybutynin (DITROPAN-XL) 10 MG 24 hr tablet Take 10 mg by mouth daily.     . simvastatin (ZOCOR) 20 MG tablet Take 20 mg by mouth  daily at 6 PM.    . Tetrahydrozoline-PEG (EYE MOISTURIZING RELIEF OP) Apply 1-2 drops to eye daily as needed (eye pain, dry eyes).    . traMADol (ULTRAM) 50 MG tablet Take 50 mg by mouth every 6 (six) hours as needed for moderate pain or severe pain.    . Vitamin D, Ergocalciferol, (DRISDOL) 50000 UNITS CAPS capsule Take 50,000 Units by mouth every 7 (seven) days. Takes on Mondays     No current facility-administered medications for this encounter.    Allergies  Allergen Reactions  . Hydrocodone     Weird sensation  . Oxycodone     Weird sensation  . Latex Rash    History   Social History  . Marital Status: Divorced    Spouse Name: N/A  . Number of Children: N/A  . Years of Education: Some colle   Occupational History  . Retired    Social History Main Topics  . Smoking status: Never Smoker   . Smokeless tobacco: Not on file  . Alcohol Use: 0.0 oz/week    0 Standard drinks or equivalent per week     Comment: Rare-wine  . Drug Use: No  . Sexual Activity: Not on file   Other Topics Concern  . Not on file   Social History Narrative   Lives at home with Lonn Georgia, roommate   Caffeine use: 1 Coffee day, 1-2 a day of tea, 1-2 sodas a day     Family History  Problem Relation Age of Onset  . Heart disease Mother   . Heart disease Father     ROS- All systems are reviewed and negative except as per the HPI above  Physical Exam: Filed Vitals:   04/01/15 1423  BP: 150/78  Pulse: 74  Height: 5\' 8"  (1.727 m)  Weight: 262 lb 9.6 oz (119.115 kg)    GEN- The patient is well appearing, alert and oriented x 3 today.   Head- normocephalic, atraumatic Eyes-  Sclera clear, conjunctiva pink Ears- hearing intact Oropharynx- clear Neck- supple, no JVP Lymph- no cervical lymphadenopathy Lungs- Clear to ausculation bilaterally, normal work of breathing Heart- Regular rate and rhythm, no murmurs, rubs or gallops, PMI not laterally displaced GI- soft, NT, ND, +  BS Extremities- no clubbing, cyanosis, or edema MS- no significant deformity or atrophy Skin- no rash or lesion Psych- euthymic mood,  full affect Neuro- strength and sensation are intact  EKG- Sinus rhythm at 74 bpm with aberrant conduction NS ST/T wave abnormality. Pr int 158 ms, QRS 74 ms, Qtc 472 ms. Epic records reviewed.   Assessment and Plan:  1. Asymptomatic aflutter found on pre op EKG SR by EKG today Echo pending tomorrow  2. Chadsvasc score of at least 4 Pt would benefit from a [place] but is pending surgery and is currently off asa.  Would anticipate starting blood thinner right after surgery when bleeding risk is low. Denies a bleeding history.  3. Sleep apnea Currently untreated Asked pt to speak to PCP re new sleep study due to the contribution of sleep apnea to atrial arrhythmias.  4.Obesity Encouraged to continue with weight loss.  5. HTN Mildly elevated today, pt states not usually the case. Avoid salt  F/u Thursday for review of echo and ability to proceed with surgery as planned.

## 2015-04-02 ENCOUNTER — Ambulatory Visit (HOSPITAL_COMMUNITY)
Admission: RE | Admit: 2015-04-02 | Discharge: 2015-04-02 | Disposition: A | Discharge status: Home / Self Care | Payer: PPO | Source: Ambulatory Visit | Attending: Nurse Practitioner | Admitting: Nurse Practitioner

## 2015-04-02 DIAGNOSIS — I517 Cardiomegaly: Secondary | ICD-10-CM | POA: Insufficient documentation

## 2015-04-02 DIAGNOSIS — I071 Rheumatic tricuspid insufficiency: Secondary | ICD-10-CM | POA: Diagnosis not present

## 2015-04-02 DIAGNOSIS — I480 Paroxysmal atrial fibrillation: Secondary | ICD-10-CM

## 2015-04-02 DIAGNOSIS — I340 Nonrheumatic mitral (valve) insufficiency: Secondary | ICD-10-CM | POA: Insufficient documentation

## 2015-04-02 DIAGNOSIS — I351 Nonrheumatic aortic (valve) insufficiency: Secondary | ICD-10-CM | POA: Insufficient documentation

## 2015-04-02 DIAGNOSIS — I4891 Unspecified atrial fibrillation: Secondary | ICD-10-CM | POA: Diagnosis present

## 2015-04-02 NOTE — Progress Notes (Signed)
  Echocardiogram 2D Echocardiogram has been performed.  Johny Chess 04/02/2015, 3:58 PM

## 2015-04-04 ENCOUNTER — Other Ambulatory Visit (HOSPITAL_COMMUNITY): Payer: Self-pay | Admitting: *Deleted

## 2015-04-04 ENCOUNTER — Encounter (HOSPITAL_COMMUNITY): Payer: Self-pay | Admitting: Nurse Practitioner

## 2015-04-04 ENCOUNTER — Ambulatory Visit (HOSPITAL_COMMUNITY)
Admission: RE | Admit: 2015-04-04 | Discharge: 2015-04-04 | Disposition: A | Discharge status: Home / Self Care | Payer: PPO | Source: Ambulatory Visit | Attending: Nurse Practitioner | Admitting: Nurse Practitioner

## 2015-04-04 VITALS — BP 132/78 | HR 63 | Wt 262.0 lb

## 2015-04-04 DIAGNOSIS — R9431 Abnormal electrocardiogram [ECG] [EKG]: Secondary | ICD-10-CM | POA: Diagnosis not present

## 2015-04-04 DIAGNOSIS — I4892 Unspecified atrial flutter: Secondary | ICD-10-CM | POA: Insufficient documentation

## 2015-04-04 MED ORDER — RIVAROXABAN 20 MG PO TABS: 20.0000 mg | ORAL_TABLET | Freq: Every day | ORAL | Status: DC

## 2015-04-04 NOTE — Patient Instructions (Signed)
Your physician has recommended you make the following change in your medication:  1)Stop aspirin and naproxen 2)Start Xarelto 20mg  once a day at supper  -- once Dr. Mayer Camel deems appropriate after surgery.  Parking code for July is [id number]

## 2015-04-04 NOTE — Progress Notes (Addendum)
Patient ID: Linda Washington, female   DOB: [birth year], 64 y.o.   MRN: [id number]            Primary Care Physician: Marda Stalker, PA-C Referring Physician: [facility] pre op Orthopedist:Dr. Patton Salles   Linda Washington is a 64 y.o. female with a h/o  DM , HTN, sleep apnea untreated, obesity that presented to [facility] for preop for pending left knee replacement and was found to be in typical aflutter with variable av block,at 68 bpm. Pt was asymptomatic and referred to afib clinic for further evaluation. She was told 15 years ago that she had mitral valve prolapse and was on BB/antibiotics  for years. She had a cath several years ago and was told she had one small lesion (20%) and the mitral valve was normal. She also has sleep apnea but is not treated. She couldn't tolerate the mask and used nasal prongs at night but her equipment eventually got old and was not replaced.She has not been reevaluated with a new sleep study. Even though she can not exercise due to knee issues, she has been modifying her diet and has lost 40 lbs. She does not use alcohol nor smoke. She has a chads vasc score of 4(female, HTN, DM, age) and has stopped her asa for surgery that is pending next Monday. No recent echo.  She returns today, 6/16, to the afib clinic having had an echo with normal EF, no significant valvular disease and left atrial moderate enlargement. Cannot r/o PFO.She is in SR today and feels well. She does have a chadsvasc score of 4 and should be on anticoagulation for prevention of stroke. She does not have a bleeding history, has normal kidney function and will start on Xarelto when surgeon says it is safe from a bleeding standpoint after surgery.   Today, she denies symptoms of palpitations, chest pain, shortness of breath, orthopnea, PND, lower extremity edema, dizziness, presyncope, syncope, or neurologic sequela. The patient is tolerating medications without difficulties and is otherwise without complaint today.    Past Medical History  Diagnosis Date  . Tremor   . Incontinence     takes Ditropan daily  . Neuropathy     takes Gabapentin daily  . RLS (restless legs syndrome)   . Basal cell carcinoma   . Hypothyroidism   . Hyperlipemia     takes Simvastatin daily  . Anxiety     takes Citalopram daily  . GERD (gastroesophageal reflux disease)     takes Omeprazole daily  . Hypertension     takes Enalapril daily  . Type 2 diabetes mellitus     takes Metformin and Amaryl daily  . Seasonal allergies     takes Claritin daioy as needed  . PONV (postoperative nausea and vomiting)   . History of Bell's palsy     left side  . History of bronchitis   . History of hiatal hernia   . Arthritis     generalized  . Joint swelling     left knee  . History of kidney stones   . Sleep apnea     pt. states that she does not have a CPAP   Past Surgical History  Procedure Laterality Date  . Basal cell carcinoma excision      Nose  . Carpal tunnel release Bilateral   . Breast lumpectomy    . Knee arthroscopy      Right  . Total abdominal hysterectomy    . Cardiac catheterization  pt. states approximately 15 years ago  . Eye surgery Bilateral     cataract surgery  . Appendectomy    . Dilation and curettage of uterus    . Achilles tendon repair Right   . Lithotripsy  2013    Current Outpatient Prescriptions  Medication Sig Dispense Refill  . citalopram (CELEXA) 20 MG tablet Take 20 mg by mouth daily.    . enalapril (VASOTEC) 10 MG tablet Take 10 mg by mouth at bedtime.     . gabapentin (NEURONTIN) 400 MG capsule Take 1-2 capsules (400-800 mg total) by mouth 3 (three) times daily. (Patient taking differently: Take 400-800 mg by mouth 3 (three) times daily. Take 400 mg in the morning and afternoon and take 800 mg in the evening) 180 capsule 6  . glimepiride (AMARYL) 1 MG tablet Take 1 mg by mouth daily with breakfast.    . loratadine (CLARITIN) 10 MG tablet Take 10 mg by mouth daily as  needed for allergies or rhinitis.     . metFORMIN (GLUCOPHAGE) 1000 MG tablet Take 1,000 mg by mouth 2 (two) times daily with a meal.    . omeprazole (PRILOSEC) 40 MG capsule Take 40 mg by mouth daily.    Marland Kitchen oxybutynin (DITROPAN-XL) 10 MG 24 hr tablet Take 10 mg by mouth daily.     . simvastatin (ZOCOR) 20 MG tablet Take 20 mg by mouth daily at 6 PM.    . Tetrahydrozoline-PEG (EYE MOISTURIZING RELIEF OP) Apply 1-2 drops to eye daily as needed (eye pain, dry eyes).    . traMADol (ULTRAM) 50 MG tablet Take 50 mg by mouth every 6 (six) hours as needed for moderate pain or severe pain.    . Vitamin D, Ergocalciferol, (DRISDOL) 50000 UNITS CAPS capsule Take 50,000 Units by mouth every 7 (seven) days. Takes on Mondays    . rivaroxaban (XARELTO) 20 MG TABS tablet Take 1 tablet (20 mg total) by mouth daily with supper. 30 tablet 0   No current facility-administered medications for this encounter.    Allergies  Allergen Reactions  . Hydrocodone     Weird sensation  . Oxycodone     Weird sensation  . Latex Rash    History   Social History  . Marital Status: Divorced    Spouse Name: N/A  . Number of Children: N/A  . Years of Education: Some colle   Occupational History  . Retired    Social History Main Topics  . Smoking status: Never Smoker   . Smokeless tobacco: Not on file  . Alcohol Use: 0.0 oz/week    0 Standard drinks or equivalent per week     Comment: Rare-wine  . Drug Use: No  . Sexual Activity: Not on file   Other Topics Concern  . Not on file   Social History Narrative   Lives at home with Lonn Georgia, roommate   Caffeine use: 1 Coffee day, 1-2 a day of tea, 1-2 sodas a day     Family History  Problem Relation Age of Onset  . Heart disease Mother   . Heart disease Father     ROS- All systems are reviewed and negative except as per the HPI above  Physical Exam: Filed Vitals:   04/04/15 1405  BP: 132/78  Pulse: 63  Weight: 262 lb (118.842 kg)    GEN-  The patient is well appearing, alert and oriented x 3 today.   Head- normocephalic, atraumatic Eyes-  Sclera clear, conjunctiva pink  Ears- hearing intact Oropharynx- clear Neck- supple, no JVP Lymph- no cervical lymphadenopathy Lungs- Clear to ausculation bilaterally, normal work of breathing Heart- Regular rate and rhythm, no murmurs, rubs or gallops, PMI not laterally displaced GI- soft, NT, ND, + BS Extremities- no clubbing, cyanosis, or edema MS- no significant deformity or atrophy Skin- no rash or lesion Psych- euthymic mood, full affect Neuro- strength and sensation are intact  EKG- Sinus rhythm at 63 bpm with  NS ST/T wave abnormality. Pr int 158 ms, QRS 82 ms, Qtc 427 ms. Epic records reviewed.   Assessment and Plan:  1. Asymptomatic aflutter found on pre op EKG SR by EKG today Aflutter was with slow v rate so would not recommend daily rate control especially with pt being asymptomatic. If she has aflutter day of surgery as long as v rate is controlled should not cause concern for  cancellation of surgery. Echo was without any significant abnormality. There was a mention of possibility of  PFO but this should not cause any issues with pending surgery and can be worked up later if deemed necessary. Proceed to surgery at low risk  2. Chadsvasc score of at least 4 Pt would benefit from a [employer] but is pending surgery and is currently off asa.  She has been given 30 day free sample card for xarelto 20 mg to take po at supper daily. She will start this when surgeon decides it is safe from a bleeding standpoint post surgery. Denies a bleeding history. She will stop daily 81 mg asa. Advised not to take with antiinflammatories.  3. Sleep apnea Currently untreated Asked pt to speak to PCP re new sleep study due to the contribution of sleep apnea to atrial arrhythmias.  4.Obesity Encouraged to continue with weight loss.  5. HTN Stable Avoid salt  F/u in one month and I will  set up for f/u with cardiology in 3 months.  The patient /echo/EKG was discussed with Dr. Thompson Grayer who feels that pt is at low risk to proceed to surgery.

## 2015-04-07 DIAGNOSIS — M1712 Unilateral primary osteoarthritis, left knee: Secondary | ICD-10-CM | POA: Diagnosis present

## 2015-04-07 MED ORDER — DEXTROSE 5 % IV SOLN
3.0000 g | INTRAVENOUS | Status: AC
  Administered 2015-04-08: 3 g via INTRAVENOUS
  Filled 2015-04-07: qty 3000

## 2015-04-08 ENCOUNTER — Inpatient Hospital Stay (HOSPITAL_COMMUNITY): Payer: PPO | Admitting: Anesthesiology

## 2015-04-08 ENCOUNTER — Encounter (HOSPITAL_COMMUNITY): Payer: Self-pay | Admitting: *Deleted

## 2015-04-08 ENCOUNTER — Inpatient Hospital Stay (HOSPITAL_COMMUNITY): Payer: PPO | Admitting: Emergency Medicine

## 2015-04-08 ENCOUNTER — Inpatient Hospital Stay (HOSPITAL_COMMUNITY)
Admission: RE | Admit: 2015-04-08 | Discharge: 2015-04-10 | DRG: 470 | Disposition: A | Discharge status: Skilled Nursing Facility | Payer: PPO | Source: Ambulatory Visit | Attending: Orthopedic Surgery | Admitting: Orthopedic Surgery

## 2015-04-08 ENCOUNTER — Encounter (HOSPITAL_COMMUNITY)
Admission: RE | Disposition: A | Discharge status: Skilled Nursing Facility | Payer: Self-pay | Source: Ambulatory Visit | Attending: Orthopedic Surgery

## 2015-04-08 DIAGNOSIS — K219 Gastro-esophageal reflux disease without esophagitis: Secondary | ICD-10-CM | POA: Diagnosis present

## 2015-04-08 DIAGNOSIS — E785 Hyperlipidemia, unspecified: Secondary | ICD-10-CM | POA: Diagnosis present

## 2015-04-08 DIAGNOSIS — M1710 Unilateral primary osteoarthritis, unspecified knee: Secondary | ICD-10-CM | POA: Diagnosis present

## 2015-04-08 DIAGNOSIS — R32 Unspecified urinary incontinence: Secondary | ICD-10-CM | POA: Diagnosis present

## 2015-04-08 DIAGNOSIS — J302 Other seasonal allergic rhinitis: Secondary | ICD-10-CM | POA: Diagnosis present

## 2015-04-08 DIAGNOSIS — E119 Type 2 diabetes mellitus without complications: Secondary | ICD-10-CM | POA: Diagnosis present

## 2015-04-08 DIAGNOSIS — G629 Polyneuropathy, unspecified: Secondary | ICD-10-CM | POA: Diagnosis present

## 2015-04-08 DIAGNOSIS — Z85828 Personal history of other malignant neoplasm of skin: Secondary | ICD-10-CM | POA: Diagnosis not present

## 2015-04-08 DIAGNOSIS — F419 Anxiety disorder, unspecified: Secondary | ICD-10-CM | POA: Diagnosis present

## 2015-04-08 DIAGNOSIS — I4892 Unspecified atrial flutter: Secondary | ICD-10-CM | POA: Diagnosis not present

## 2015-04-08 DIAGNOSIS — Z6841 Body Mass Index (BMI) 40.0 and over, adult: Secondary | ICD-10-CM | POA: Diagnosis not present

## 2015-04-08 DIAGNOSIS — M1712 Unilateral primary osteoarthritis, left knee: Principal | ICD-10-CM | POA: Diagnosis present

## 2015-04-08 DIAGNOSIS — E039 Hypothyroidism, unspecified: Secondary | ICD-10-CM | POA: Diagnosis present

## 2015-04-08 DIAGNOSIS — I10 Essential (primary) hypertension: Secondary | ICD-10-CM | POA: Diagnosis present

## 2015-04-08 DIAGNOSIS — G2581 Restless legs syndrome: Secondary | ICD-10-CM | POA: Diagnosis present

## 2015-04-08 DIAGNOSIS — M25562 Pain in left knee: Secondary | ICD-10-CM | POA: Diagnosis present

## 2015-04-08 DIAGNOSIS — Z885 Allergy status to narcotic agent status: Secondary | ICD-10-CM | POA: Diagnosis not present

## 2015-04-08 DIAGNOSIS — Z91040 Latex allergy status: Secondary | ICD-10-CM

## 2015-04-08 HISTORY — PX: TOTAL KNEE ARTHROPLASTY: SHX125

## 2015-04-08 LAB — GLUCOSE, CAPILLARY
GLUCOSE-CAPILLARY: 96 mg/dL (ref 65–99)
Glucose-Capillary: 117 mg/dL — ABNORMAL HIGH (ref 65–99)
Glucose-Capillary: 174 mg/dL — ABNORMAL HIGH (ref 65–99)
Glucose-Capillary: 180 mg/dL — ABNORMAL HIGH (ref 65–99)

## 2015-04-08 SURGERY — ARTHROPLASTY, KNEE, TOTAL
Anesthesia: Spinal | Site: Knee | Laterality: Left

## 2015-04-08 MED ORDER — TRANEXAMIC ACID 1000 MG/10ML IV SOLN
2000.0000 mg | Freq: Once | INTRAVENOUS | Status: AC
  Administered 2015-04-08: 2000 mg via TOPICAL
  Filled 2015-04-08: qty 20

## 2015-04-08 MED ORDER — SUCCINYLCHOLINE CHLORIDE 20 MG/ML IJ SOLN
INTRAMUSCULAR | Status: AC
  Filled 2015-04-08: qty 1

## 2015-04-08 MED ORDER — METFORMIN HCL 500 MG PO TABS
1000.0000 mg | ORAL_TABLET | Freq: Two times a day (BID) | ORAL | Status: DC
  Administered 2015-04-08 – 2015-04-10 (×4): 1000 mg via ORAL
  Filled 2015-04-08 (×4): qty 2

## 2015-04-08 MED ORDER — SIMVASTATIN 20 MG PO TABS
20.0000 mg | ORAL_TABLET | Freq: Every day | ORAL | Status: DC
  Administered 2015-04-08 – 2015-04-09 (×2): 20 mg via ORAL
  Filled 2015-04-08 (×2): qty 1

## 2015-04-08 MED ORDER — METHOCARBAMOL 500 MG PO TABS
ORAL_TABLET | ORAL | Status: AC
  Administered 2015-04-08: 500 mg via ORAL
  Filled 2015-04-08: qty 1

## 2015-04-08 MED ORDER — GABAPENTIN 400 MG PO CAPS
400.0000 mg | ORAL_CAPSULE | Freq: Two times a day (BID) | ORAL | Status: DC
  Administered 2015-04-08 – 2015-04-10 (×4): 400 mg via ORAL
  Filled 2015-04-08 (×4): qty 1

## 2015-04-08 MED ORDER — GABAPENTIN 400 MG PO CAPS
800.0000 mg | ORAL_CAPSULE | Freq: Every day | ORAL | Status: DC
  Administered 2015-04-08 – 2015-04-09 (×2): 800 mg via ORAL
  Filled 2015-04-08 (×2): qty 2

## 2015-04-08 MED ORDER — LIDOCAINE HCL (CARDIAC) 20 MG/ML IV SOLN
INTRAVENOUS | Status: AC
  Filled 2015-04-08: qty 5

## 2015-04-08 MED ORDER — EPHEDRINE SULFATE 50 MG/ML IJ SOLN
INTRAMUSCULAR | Status: AC
  Filled 2015-04-08: qty 1

## 2015-04-08 MED ORDER — FLEET ENEMA 7-19 GM/118ML RE ENEM: 1.0000 | ENEMA | Freq: Once | RECTAL | Status: AC | PRN

## 2015-04-08 MED ORDER — METHOCARBAMOL 1000 MG/10ML IJ SOLN
500.0000 mg | Freq: Four times a day (QID) | INTRAVENOUS | Status: DC | PRN
  Filled 2015-04-08: qty 5

## 2015-04-08 MED ORDER — HYDROMORPHONE HCL 1 MG/ML IJ SOLN
INTRAMUSCULAR | Status: AC
  Administered 2015-04-08: 1 mg via INTRAVENOUS
  Filled 2015-04-08: qty 1

## 2015-04-08 MED ORDER — BUPIVACAINE LIPOSOME 1.3 % IJ SUSP
20.0000 mL | Freq: Once | INTRAMUSCULAR | Status: AC
  Administered 2015-04-08: 20 mL
  Filled 2015-04-08: qty 20

## 2015-04-08 MED ORDER — METOCLOPRAMIDE HCL 5 MG PO TABS: 5.0000 mg | ORAL_TABLET | Freq: Three times a day (TID) | ORAL | Status: DC | PRN

## 2015-04-08 MED ORDER — ONDANSETRON HCL 4 MG/2ML IJ SOLN
INTRAMUSCULAR | Status: AC
  Filled 2015-04-08: qty 2

## 2015-04-08 MED ORDER — SENNOSIDES-DOCUSATE SODIUM 8.6-50 MG PO TABS: 1.0000 | ORAL_TABLET | Freq: Every evening | ORAL | Status: DC | PRN

## 2015-04-08 MED ORDER — HYDROMORPHONE HCL 1 MG/ML IJ SOLN
0.2500 mg | INTRAMUSCULAR | Status: DC | PRN
  Administered 2015-04-08 (×4): 0.5 mg via INTRAVENOUS

## 2015-04-08 MED ORDER — SODIUM CHLORIDE 0.9 % IJ SOLN
INTRAMUSCULAR | Status: DC | PRN
  Administered 2015-04-08: 40 mL

## 2015-04-08 MED ORDER — HYDROMORPHONE HCL 1 MG/ML IJ SOLN
INTRAMUSCULAR | Status: AC
  Administered 2015-04-08: 0.5 mg via INTRAVENOUS
  Filled 2015-04-08: qty 1

## 2015-04-08 MED ORDER — ALUM & MAG HYDROXIDE-SIMETH 200-200-20 MG/5ML PO SUSP
30.0000 mL | ORAL | Status: DC | PRN
Start: 2015-04-08 — End: 2015-04-10

## 2015-04-08 MED ORDER — HYDROMORPHONE HCL 1 MG/ML IJ SOLN
0.5000 mg | INTRAMUSCULAR | Status: DC | PRN
  Administered 2015-04-08 – 2015-04-09 (×5): 1 mg via INTRAVENOUS
  Administered 2015-04-09: 0.5 mg via INTRAVENOUS
  Administered 2015-04-09: 1 mg via INTRAVENOUS
  Filled 2015-04-08 (×6): qty 1

## 2015-04-08 MED ORDER — METOCLOPRAMIDE HCL 5 MG/ML IJ SOLN: 5.0000 mg | Freq: Three times a day (TID) | INTRAMUSCULAR | Status: DC | PRN

## 2015-04-08 MED ORDER — INSULIN ASPART 100 UNIT/ML ~~LOC~~ SOLN
0.0000 [IU] | Freq: Three times a day (TID) | SUBCUTANEOUS | Status: DC
  Administered 2015-04-08 – 2015-04-09 (×2): 3 [IU] via SUBCUTANEOUS
  Administered 2015-04-09: 2 [IU] via SUBCUTANEOUS
  Administered 2015-04-09: 3 [IU] via SUBCUTANEOUS
  Administered 2015-04-10: 2 [IU] via SUBCUTANEOUS

## 2015-04-08 MED ORDER — PHENOL 1.4 % MT LIQD: 1.0000 | OROMUCOSAL | Status: DC | PRN

## 2015-04-08 MED ORDER — PROPOFOL 10 MG/ML IV BOLUS
INTRAVENOUS | Status: AC
  Filled 2015-04-08: qty 20

## 2015-04-08 MED ORDER — LORATADINE 10 MG PO TABS: 10.0000 mg | ORAL_TABLET | Freq: Every day | ORAL | Status: DC | PRN

## 2015-04-08 MED ORDER — ACETAMINOPHEN 650 MG RE SUPP: 650.0000 mg | Freq: Four times a day (QID) | RECTAL | Status: DC | PRN

## 2015-04-08 MED ORDER — ROCURONIUM BROMIDE 50 MG/5ML IV SOLN
INTRAVENOUS | Status: AC
  Filled 2015-04-08: qty 1

## 2015-04-08 MED ORDER — CEFUROXIME SODIUM 1.5 G IJ SOLR
INTRAMUSCULAR | Status: DC | PRN
  Administered 2015-04-08: 1.5 g

## 2015-04-08 MED ORDER — ONDANSETRON HCL 4 MG PO TABS: 4.0000 mg | ORAL_TABLET | Freq: Four times a day (QID) | ORAL | Status: DC | PRN

## 2015-04-08 MED ORDER — CITALOPRAM HYDROBROMIDE 20 MG PO TABS
20.0000 mg | ORAL_TABLET | Freq: Every day | ORAL | Status: DC
  Administered 2015-04-09 – 2015-04-10 (×2): 20 mg via ORAL
  Filled 2015-04-08 (×2): qty 1

## 2015-04-08 MED ORDER — PROPOFOL INFUSION 10 MG/ML OPTIME
INTRAVENOUS | Status: DC | PRN
  Administered 2015-04-08: 75 ug/kg/min via INTRAVENOUS

## 2015-04-08 MED ORDER — DIPHENHYDRAMINE HCL 12.5 MG/5ML PO ELIX: 12.5000 mg | ORAL_SOLUTION | ORAL | Status: DC | PRN

## 2015-04-08 MED ORDER — FENTANYL CITRATE (PF) 250 MCG/5ML IJ SOLN
INTRAMUSCULAR | Status: AC
  Filled 2015-04-08: qty 5

## 2015-04-08 MED ORDER — ONDANSETRON HCL 4 MG/2ML IJ SOLN: 4.0000 mg | Freq: Once | INTRAMUSCULAR | Status: DC | PRN

## 2015-04-08 MED ORDER — GLIMEPIRIDE 1 MG PO TABS
1.0000 mg | ORAL_TABLET | Freq: Every day | ORAL | Status: DC
  Administered 2015-04-09 – 2015-04-10 (×2): 1 mg via ORAL
  Filled 2015-04-08 (×4): qty 1

## 2015-04-08 MED ORDER — ONDANSETRON HCL 4 MG/2ML IJ SOLN
4.0000 mg | Freq: Four times a day (QID) | INTRAMUSCULAR | Status: DC | PRN
  Administered 2015-04-09: 4 mg via INTRAVENOUS
  Filled 2015-04-08: qty 2

## 2015-04-08 MED ORDER — LIDOCAINE HCL (CARDIAC) 20 MG/ML IV SOLN
INTRAVENOUS | Status: DC | PRN
  Administered 2015-04-08: 40 mg via INTRAVENOUS

## 2015-04-08 MED ORDER — ENALAPRIL MALEATE 10 MG PO TABS
10.0000 mg | ORAL_TABLET | Freq: Every day | ORAL | Status: DC
  Administered 2015-04-08 – 2015-04-09 (×2): 10 mg via ORAL
  Filled 2015-04-08 (×4): qty 1

## 2015-04-08 MED ORDER — DEXTROSE-NACL 5-0.45 % IV SOLN: INTRAVENOUS | Status: DC

## 2015-04-08 MED ORDER — DOCUSATE SODIUM 100 MG PO CAPS
100.0000 mg | ORAL_CAPSULE | Freq: Two times a day (BID) | ORAL | Status: DC
  Administered 2015-04-08 – 2015-04-10 (×4): 100 mg via ORAL
  Filled 2015-04-08 (×4): qty 1

## 2015-04-08 MED ORDER — LACTATED RINGERS IV SOLN: INTRAVENOUS | Status: DC

## 2015-04-08 MED ORDER — BISACODYL 5 MG PO TBEC: 5.0000 mg | DELAYED_RELEASE_TABLET | Freq: Every day | ORAL | Status: DC | PRN

## 2015-04-08 MED ORDER — MENTHOL 3 MG MT LOZG: 1.0000 | LOZENGE | OROMUCOSAL | Status: DC | PRN

## 2015-04-08 MED ORDER — RIVAROXABAN 10 MG PO TABS
10.0000 mg | ORAL_TABLET | Freq: Every day | ORAL | Status: DC
  Administered 2015-04-09 – 2015-04-10 (×2): 10 mg via ORAL
  Filled 2015-04-08 (×2): qty 1

## 2015-04-08 MED ORDER — OXYBUTYNIN CHLORIDE ER 10 MG PO TB24
10.0000 mg | ORAL_TABLET | Freq: Every day | ORAL | Status: DC
  Administered 2015-04-09 – 2015-04-10 (×2): 10 mg via ORAL
  Filled 2015-04-08 (×2): qty 1

## 2015-04-08 MED ORDER — SODIUM CHLORIDE 0.9 % IR SOLN
Status: DC | PRN
  Administered 2015-04-08: 1000 mL

## 2015-04-08 MED ORDER — FENTANYL CITRATE (PF) 100 MCG/2ML IJ SOLN
INTRAMUSCULAR | Status: DC | PRN
  Administered 2015-04-08 (×3): 50 ug via INTRAVENOUS

## 2015-04-08 MED ORDER — CEFUROXIME SODIUM 1.5 G IJ SOLR
INTRAMUSCULAR | Status: AC
  Filled 2015-04-08: qty 1.5

## 2015-04-08 MED ORDER — METHOCARBAMOL 500 MG PO TABS
500.0000 mg | ORAL_TABLET | Freq: Four times a day (QID) | ORAL | Status: DC | PRN
  Administered 2015-04-08 – 2015-04-09 (×3): 500 mg via ORAL
  Filled 2015-04-08 (×2): qty 1

## 2015-04-08 MED ORDER — LACTATED RINGERS IV SOLN
INTRAVENOUS | Status: DC | PRN
  Administered 2015-04-08 (×2): via INTRAVENOUS

## 2015-04-08 MED ORDER — HYDROMORPHONE HCL 1 MG/ML IJ SOLN
INTRAMUSCULAR | Status: AC
  Filled 2015-04-08: qty 1

## 2015-04-08 MED ORDER — KCL IN DEXTROSE-NACL 20-5-0.45 MEQ/L-%-% IV SOLN
INTRAVENOUS | Status: DC
  Administered 2015-04-08: 22:00:00 via INTRAVENOUS
  Filled 2015-04-08: qty 1000

## 2015-04-08 MED ORDER — ACETAMINOPHEN 325 MG PO TABS: 650.0000 mg | ORAL_TABLET | Freq: Four times a day (QID) | ORAL | Status: DC | PRN

## 2015-04-08 MED ORDER — ONDANSETRON HCL 4 MG/2ML IJ SOLN
INTRAMUSCULAR | Status: DC | PRN
  Administered 2015-04-08 (×2): 4 mg via INTRAVENOUS

## 2015-04-08 MED ORDER — PANTOPRAZOLE SODIUM 40 MG PO TBEC
40.0000 mg | DELAYED_RELEASE_TABLET | Freq: Every day | ORAL | Status: DC
  Administered 2015-04-09 – 2015-04-10 (×2): 40 mg via ORAL
  Filled 2015-04-08 (×2): qty 1

## 2015-04-08 MED ORDER — HYDROMORPHONE HCL 2 MG PO TABS
4.0000 mg | ORAL_TABLET | ORAL | Status: DC | PRN
  Administered 2015-04-08 – 2015-04-10 (×10): 4 mg via ORAL
  Filled 2015-04-08 (×11): qty 2

## 2015-04-08 MED ORDER — SODIUM CHLORIDE 0.9 % IJ SOLN
INTRAMUSCULAR | Status: AC
  Filled 2015-04-08: qty 10

## 2015-04-08 MED ORDER — CHLORHEXIDINE GLUCONATE 4 % EX LIQD: 60.0000 mL | Freq: Once | CUTANEOUS | Status: DC

## 2015-04-08 SURGICAL SUPPLY — 58 items
BANDAGE ESMARK 6X9 LF (GAUZE/BANDAGES/DRESSINGS) ×1 IMPLANT
BLADE SAG 18X100X1.27 (BLADE) ×2 IMPLANT
BLADE SAW SGTL 13X75X1.27 (BLADE) ×2 IMPLANT
BLADE SURG ROTATE 9660 (MISCELLANEOUS) IMPLANT
BNDG ELASTIC 6X10 VLCR STRL LF (GAUZE/BANDAGES/DRESSINGS) ×2 IMPLANT
BNDG ESMARK 6X9 LF (GAUZE/BANDAGES/DRESSINGS) ×2
BOWL SMART MIX CTS (DISPOSABLE) ×2 IMPLANT
CAPT KNEE TOTAL 3 ATTUNE ×2 IMPLANT
CEMENT HV SMART SET (Cement) ×4 IMPLANT
COVER SURGICAL LIGHT HANDLE (MISCELLANEOUS) ×2 IMPLANT
CUFF TOURNIQUET SINGLE 34IN LL (TOURNIQUET CUFF) ×2 IMPLANT
DRAPE EXTREMITY T 121X128X90 (DRAPE) ×2 IMPLANT
DRAPE IMP U-DRAPE 54X76 (DRAPES) ×2 IMPLANT
DRAPE U-SHAPE 47X51 STRL (DRAPES) ×2 IMPLANT
DURAPREP 26ML APPLICATOR (WOUND CARE) ×4 IMPLANT
ELECT REM PT RETURN 9FT ADLT (ELECTROSURGICAL) ×2
ELECTRODE REM PT RTRN 9FT ADLT (ELECTROSURGICAL) ×1 IMPLANT
EVACUATOR 1/8 PVC DRAIN (DRAIN) IMPLANT
GAUZE SPONGE 4X4 12PLY STRL (GAUZE/BANDAGES/DRESSINGS) ×2 IMPLANT
GAUZE XEROFORM 1X8 LF (GAUZE/BANDAGES/DRESSINGS) ×2 IMPLANT
GLOVE BIOGEL PI IND STRL 8 (GLOVE) ×1 IMPLANT
GLOVE BIOGEL PI IND STRL 9 (GLOVE) ×1 IMPLANT
GLOVE BIOGEL PI INDICATOR 8 (GLOVE) ×1
GLOVE BIOGEL PI INDICATOR 9 (GLOVE) ×1
GOWN STRL REUS W/ TWL LRG LVL3 (GOWN DISPOSABLE) ×1 IMPLANT
GOWN STRL REUS W/ TWL XL LVL3 (GOWN DISPOSABLE) ×2 IMPLANT
GOWN STRL REUS W/TWL LRG LVL3 (GOWN DISPOSABLE) ×1
GOWN STRL REUS W/TWL XL LVL3 (GOWN DISPOSABLE) ×2
HANDPIECE INTERPULSE COAX TIP (DISPOSABLE) ×1
HOOD PEEL AWAY FACE SHEILD DIS (HOOD) ×4 IMPLANT
KIT BASIN OR (CUSTOM PROCEDURE TRAY) ×2 IMPLANT
KIT ROOM TURNOVER OR (KITS) ×2 IMPLANT
MANIFOLD NEPTUNE II (INSTRUMENTS) ×2 IMPLANT
NDL SAFETY ECLIPSE 18X1.5 (NEEDLE) IMPLANT
NEEDLE 22X1 1/2 (OR ONLY) (NEEDLE) ×2 IMPLANT
NEEDLE HYPO 18GX1.5 SHARP (NEEDLE)
NEEDLE SPNL 18GX3.5 QUINCKE PK (NEEDLE) ×2 IMPLANT
NS IRRIG 1000ML POUR BTL (IV SOLUTION) ×2 IMPLANT
PACK TOTAL JOINT (CUSTOM PROCEDURE TRAY) ×2 IMPLANT
PACK UNIVERSAL I (CUSTOM PROCEDURE TRAY) ×2 IMPLANT
PAD ARMBOARD 7.5X6 YLW CONV (MISCELLANEOUS) ×4 IMPLANT
PADDING CAST COTTON 6X4 STRL (CAST SUPPLIES) ×2 IMPLANT
SET HNDPC FAN SPRY TIP SCT (DISPOSABLE) ×1 IMPLANT
SUCTION FRAZIER TIP 10 FR DISP (SUCTIONS) ×2 IMPLANT
SUT VIC AB 0 CT1 27 (SUTURE) ×1
SUT VIC AB 0 CT1 27XBRD ANBCTR (SUTURE) ×1 IMPLANT
SUT VIC AB 1 CTX 36 (SUTURE) ×1
SUT VIC AB 1 CTX36XBRD ANBCTR (SUTURE) ×1 IMPLANT
SUT VIC AB 2-0 CT1 27 (SUTURE) ×1
SUT VIC AB 2-0 CT1 TAPERPNT 27 (SUTURE) ×1 IMPLANT
SUT VIC AB 3-0 CT1 27 (SUTURE) ×1
SUT VIC AB 3-0 CT1 TAPERPNT 27 (SUTURE) ×1 IMPLANT
SUT VIC AB 3-0 FS2 27 (SUTURE) ×2 IMPLANT
SYR 30ML LL (SYRINGE) ×2 IMPLANT
SYR 50ML LL SCALE MARK (SYRINGE) ×2 IMPLANT
TOWEL OR 17X24 6PK STRL BLUE (TOWEL DISPOSABLE) ×2 IMPLANT
TOWEL OR 17X26 10 PK STRL BLUE (TOWEL DISPOSABLE) ×2 IMPLANT
WATER STERILE IRR 1000ML POUR (IV SOLUTION) ×6 IMPLANT

## 2015-04-08 NOTE — Anesthesia Procedure Notes (Signed)
Spinal Patient location during procedure: OR Start time: 04/08/2015 8:40 AM End time: 04/08/2015 8:42 AM Staffing Anesthesiologist: Kate Sable Preanesthetic Checklist Completed: patient identified, site marked, surgical consent, pre-op evaluation, timeout performed, IV checked, risks and benefits discussed and monitors and equipment checked Spinal Block Patient position: sitting Prep: ChloraPrep Patient monitoring: heart rate, cardiac monitor, continuous pulse ox and blood pressure Approach: midline Location: L3-4 Injection technique: single-shot Needle Needle type: Quincke  Needle gauge: 22 G Needle length: 9 cm Needle insertion depth: 6 cm Assessment Sensory level: T10 Additional Notes Pt accepts procedure w/ risks. 22ga w/o difficulty. CSF clear free flow w/o difficulty. GES

## 2015-04-08 NOTE — Discharge Instructions (Addendum)

## 2015-04-08 NOTE — Interval H&P Note (Signed)
History and Physical Interval Note:  04/08/2015 7:19 AM  Linda Washington  has presented today for surgery, with the diagnosis of OSTEOARTHRITIS LEFT KNEE  The various methods of treatment have been discussed with the patient and family. After consideration of risks, benefits and other options for treatment, the patient has consented to  Procedure(s): TOTAL KNEE ARTHROPLASTY (Left) as a surgical intervention .  The patient's history has been reviewed, patient examined, no change in status, stable for surgery.  I have reviewed the patient's chart and labs.  Questions were answered to the patient's satisfaction.     Kerin Salen

## 2015-04-08 NOTE — Plan of Care (Signed)
Problem: Consults Goal: Diagnosis- Total Joint Replacement Primary Total Knee left     

## 2015-04-08 NOTE — Anesthesia Preprocedure Evaluation (Addendum)
Anesthesia Evaluation  Patient identified by MRN, date of birth, ID band Patient awake    Reviewed: Allergy & Precautions, NPO status , Patient's Chart, lab work & pertinent test results  History of Anesthesia Complications (+) PONV  Airway Mallampati: II       Dental  (+) Missing   Pulmonary sleep apnea ,  Patient intolerant to CPAP   Pulmonary exam normal       Cardiovascular hypertension, Pt. on medications Rhythm:Regular     Neuro/Psych Anxiety Rest.ess leg syndrome    GI/Hepatic hiatal hernia, GERD-  Medicated,  Endo/Other  diabetes, Type 2, Oral Hypoglycemic AgentsHypothyroidism Glucose 96  Renal/GU      Musculoskeletal  (+) Arthritis -,   Abdominal (+)  Abdomen: soft.    Peds  Hematology   Anesthesia Other Findings   Reproductive/Obstetrics                            Anesthesia Physical Anesthesia Plan  ASA: III  Anesthesia Plan: Spinal   Post-op Pain Management:    Induction: Intravenous  Airway Management Planned: Mask  Additional Equipment:   Intra-op Plan:   Post-operative Plan:   Informed Consent: I have reviewed the patients History and Physical, chart, labs and discussed the procedure including the risks, benefits and alternatives for the proposed anesthesia with the patient or authorized representative who has indicated his/her understanding and acceptance.   Dental advisory given  Plan Discussed with: CRNA, Anesthesiologist and Surgeon  Anesthesia Plan Comments:        Anesthesia Quick Evaluation

## 2015-04-08 NOTE — Care Management (Signed)
Utilization review completed by Julyanna N. Seniyah Esker, RN BSN 

## 2015-04-08 NOTE — Transfer of Care (Signed)
Immediate Anesthesia Transfer of Care Note  Patient: Linda Washington  Procedure(s) Performed: Procedure(s): LEFT TOTAL KNEE ARTHROPLASTY (Left)  Patient Location: PACU  Anesthesia Type:Spinal  Level of Consciousness: awake, alert , oriented and patient cooperative  Airway & Oxygen Therapy: Patient Spontanous Breathing and Patient connected to face mask oxygen  Post-op Assessment: Report given to RN, Post -op Vital signs reviewed and stable, Patient moving all extremities and Patient moving all extremities X 4  Post vital signs: Reviewed and stable  Last Vitals:  Filed Vitals:   04/08/15 0655  BP: 139/52  Pulse: 67  Temp: 36.6 C  Resp: 18    Complications: No apparent anesthesia complications

## 2015-04-08 NOTE — Evaluation (Signed)
Physical Therapy Evaluation Patient Details Name: Linda Washington MRN: [id number] DOB: [birth year] Today's Date: 04/08/2015   History of Present Illness  Pt is a 64 y/o F s/p L TKA.  Pt's PMH includes tremor, incontinence, neuropathy, restless leg syndrome, anxiety, HTN, B carpal tunnel release.  Clinical Impression  Pt is s/p L TKA resulting in the deficits listed below (see PT Problem List). Pt ambulated 35 ft in room and completed therapeutic exercises supine and sitting EOB. Pt will benefit from skilled PT to increase their independence and safety with mobility to allow discharge to the venue listed below.      Follow Up Recommendations SNF    Equipment Recommendations  Other (comment) (TBD by next venue of care)    Recommendations for Other Services       Precautions / Restrictions Precautions Precautions: Fall;Knee Precaution Booklet Issued: Yes (comment) Precaution Comments: Reviewed no pillow under knee Restrictions Weight Bearing Restrictions: Yes LLE Weight Bearing: Weight bearing as tolerated      Mobility  Bed Mobility Overal bed mobility: Modified Independent             General bed mobility comments: Heavy use of bed rails, VCs for technique.  Transfers Overall transfer level: Needs assistance Equipment used: Rolling walker (2 wheeled) Transfers: Sit to/from Stand Sit to Stand: Min guard         General transfer comment: Cues for proper hand placement  Ambulation/Gait Ambulation/Gait assistance: Min guard Ambulation Distance (Feet): 35 Feet Assistive device: Rolling walker (2 wheeled) Gait Pattern/deviations: Step-to pattern;Decreased weight shift to left;Decreased stride length;Antalgic;Trunk flexed   Gait velocity interpretation: Below normal speed for age/gender General Gait Details: Min trunk flexion, cues to stand upright.  Dec weight shift to [employer]LE[employer] WB through [place] to offload LLE.  Stairs            Wheelchair Mobility     Modified Rankin (Stroke Patients Only)       Balance Overall balance assessment: Needs assistance Sitting-balance support: Bilateral upper extremity supported;Feet supported Sitting balance-Leahy Scale: Good     Standing balance support: Bilateral upper extremity supported;During functional activity Standing balance-Leahy Scale: Fair                               Pertinent Vitals/Pain Pain Assessment: 0-10 Pain Score: 4  Pain Location: L knee Pain Descriptors / Indicators: Aching Pain Intervention(s): Limited activity within patient's tolerance;Monitored during session;Repositioned;Premedicated before session    Home Living Family/patient expects to be discharged to:: Skilled nursing facility Living Arrangements: Non-relatives/Friends (has rommate who just had shoulder surgery)                    Prior Function Level of Independence: Independent with assistive device(s)         Comments: Cane when L knee was hurting     Hand Dominance        Extremity/Trunk Assessment   Upper Extremity Assessment: Defer to OT evaluation           Lower Extremity Assessment: LLE deficits/detail   LLE Deficits / Details: weakness and limited ROM as expected s/p L TKA     Communication   Communication: No difficulties  Cognition Arousal/Alertness: Awake/alert Behavior During Therapy: WFL for tasks assessed/performed Overall Cognitive Status: Within Functional Limits for tasks assessed  General Comments      Exercises Total Joint Exercises Ankle Circles/Pumps: AROM;Both;15 reps;Supine Quad Sets: AROM;Both;5 reps;Supine Heel Slides: AROM;Left;5 reps;Supine Knee Flexion: AROM;Left;5 reps;Seated Goniometric ROM: 9-95      Assessment/Plan    PT Assessment Patient needs continued PT services  PT Diagnosis Difficulty walking;Abnormality of gait;Generalized weakness;Acute pain   PT Problem List Decreased  strength;Decreased range of motion;Decreased activity tolerance;Decreased balance;Decreased mobility;Decreased coordination;Decreased knowledge of use of DME;Decreased knowledge of precautions;Decreased safety awareness;Decreased skin integrity;Pain  PT Treatment Interventions DME instruction;Gait training;Stair training;Functional mobility training;Therapeutic activities;Therapeutic exercise;Balance training;Neuromuscular re-education;Patient/family education;Modalities   PT Goals (Current goals can be found in the Care Plan section) Acute Rehab PT Goals Patient Stated Goal: to go to rehab then home PT Goal Formulation: With patient Time For Goal Achievement: 04/15/15 Potential to Achieve Goals: Good    Frequency 7X/week   Barriers to discharge Decreased caregiver support no assist at home    Co-evaluation               End of Session Equipment Utilized During Treatment: Gait belt Activity Tolerance: Patient tolerated treatment well Patient left: in chair;with call bell/phone within reach;with family/visitor present Nurse Communication: Mobility status;Precautions;Weight bearing status         Time: 8182-9937 PT Time Calculation (min) (ACUTE ONLY): 17 min   Charges:   PT Evaluation $Initial PT Evaluation Tier I: 1 Procedure     PT G CodesJoslyn Hy PT, DPT [phone] Pager: [phone]  04/08/2015, 4:02 PM

## 2015-04-08 NOTE — Anesthesia Postprocedure Evaluation (Signed)
  Anesthesia Post-op Note  Patient: Linda Washington  Procedure(s) Performed: Procedure(s): LEFT TOTAL KNEE ARTHROPLASTY (Left)  Patient Location: PACU  Anesthesia Type:Spinal  Level of Consciousness: awake, alert , oriented and patient cooperative  Airway and Oxygen Therapy: Patient Spontanous Breathing  Post-op Pain: none  Post-op Assessment: Post-op Vital signs reviewed, Patient's Cardiovascular Status Stable, Respiratory Function Stable, Patent Airway, No signs of Nausea or vomiting and Pain level controlled LLE Motor Response: Purposeful movement, Responds to commands LLE Sensation: Full sensation, Pain, No numbness, No tingling RLE Motor Response: Purposeful movement, Responds to commands RLE Sensation: Full sensation, No numbness, No pain, No tingling      Post-op Vital Signs: stable  Last Vitals:  Filed Vitals:   04/08/15 1217  BP: 140/44  Pulse: 64  Temp: 36.7 C  Resp: 18    Complications: No apparent anesthesia complications

## 2015-04-08 NOTE — Op Note (Signed)
PATIENT ID:      Linda Washington  MRN:     [id number] DOB/AGE:    Dec 16, 1950 / 64 y.o.       OPERATIVE REPORT    DATE OF PROCEDURE:  04/08/2015       PREOPERATIVE DIAGNOSIS:   OSTEOARTHRITIS LEFT KNEE      Estimated body mass index is 39.85 kg/(m^2) as calculated from the following:   Height as of 04/01/15: 5\' 8"  (1.727 m).   Weight as of this encounter: 118.842 kg (262 lb).                                                        POSTOPERATIVE DIAGNOSIS:   OSTEOARTHRITIS LEFT KNEE                                                                      PROCEDURE:  Procedure(s): LEFT TOTAL KNEE ARTHROPLASTY Using DepuyAttune RP implants #6L Femur, #7Tibia, 5 mm Attune RP bearing, 41 Patella     SURGEON: Krystyne Tewksbury J    ASSISTANT:   [employer]   (Present and scrubbed throughout the case, critical for assistance with exposure, retraction, instrumentation, and closure.)         ANESTHESIA: Spinal, Exparel  EBL: 400  FLUID REPLACEMENT: 1600 crystalloid  TOURNIQUET TIME: 31min  Drains: None  Tranexamic Acid: 2gm topical   COMPLICATIONS:  None         INDICATIONS FOR PROCEDURE: The patient has  OSTEOARTHRITIS LEFT KNEE, varus deformities, XR shows bone on bone arthritis. Patient has failed all conservative measures including anti-inflammatory medicines, narcotics, attempts at  exercise and weight loss, cortisone injections and viscosupplementation.  Risks and benefits of surgery have been discussed, questions answered.   DESCRIPTION OF PROCEDURE: The patient identified by armband, received  IV antibiotics, in the holding area at [facility]. Patient taken to the operating room, appropriate anesthetic  monitors were attached, and general endotracheal anesthesia induced with  the patient in supine position. Tourniquet  applied high to the operative thigh. Lateral post and foot positioner  applied to the table, the lower extremity was then prepped and draped  in usual sterile  fashion from the ankle to the tourniquet. Time-out procedure was performed. We began the operation, with the knee flexed 100 degrees, by making the anterior midline incision starting at handbreadth above the patella going over the patella 1 cm medial to and 4 cm distal to the tibial tubercle. Small bleeders in the skin and the  subcutaneous tissue identified and cauterized. Transverse retinaculum was incised and reflected medially and a medial parapatellar arthrotomy was accomplished. the patella was everted and theprepatellar fat pad resected. The superficial medial collateral  ligament was then elevated from anterior to posterior along the proximal  flare of the tibia and anterior half of the menisci resected. The knee was hyperflexed exposing bone on bone arthritis. Peripheral and notch osteophytes as well as the cruciate ligaments were then resected. We continued to  work our way around posteriorly along the proximal tibia, and externally  rotated the tibia subluxing it out from underneath the femur. A McHale  retractor was placed through the notch and a lateral Hohmann retractor  placed, and we then drilled through the proximal tibia in line with the  axis of the tibia followed by an intramedullary guide rod and 2-degree  posterior slope cutting guide. The tibial cutting guide, 3 degree posterior sloped, was pinned into place allowing resection of 2 mm of bone medially and about 10 mm of bone laterally. Satisfied with the tibial resection, we then  entered the distal femur 2 mm anterior to the PCL origin with the  intramedullary guide rod and applied the distal femoral cutting guide  set at 8mm, with 5 degrees of valgus. This was pinned along the  epicondylar axis. At this point, the distal femoral cut was accomplished without difficulty. We then sized for a #6L femoral component and pinned the guide in 3 degrees of external rotation.The chamfer cutting guide was pinned into place. The anterior,  posterior, and chamfer cuts were accomplished without difficulty followed by  the Attune RP box cutting guide and the box cut. We also removed posterior osteophytes from the posterior femoral condyles. At this  time, the knee was brought into full extension. We checked our  extension and flexion gaps and found them symmetric for a 6 mm bearing. Distracting in extension with a lamina spreader, the posterior horns of the menisci were removed, and Exparel, diluted to 60 cc, was injected into the capsule of the knee. The  posterior patella cut was accomplished with the 9.5 mm Attune cutting guide, sized at 48mm dome, and the fixation pegs drilled.The knee  was then once again hyperflexed exposing the proximal tibia. We sized for a #7 tibial base plate, applied the smokestack and the conical reamer followed by the the Delta fin keel punch. We then hammered into place the Attune RP trial femoral component, inserted a  6 mm trial bearing, trial patellar button, and took the knee through range of motion from 0-130 degrees. No thumb pressure was required for patellar  Tracking. At this point, the limb was wrapped with an Esmarch bandage and the tourniquet inflated to 350 mmHg. All trial components were removed, mating surfaces irrigated with pulse lavage, and dried with suction and sponges. A double batch of DePuy HV cement with 1500 mg of Zinacef was mixed and applied to all bony metallic mating surfaces except for the posterior condyles of the femur itself. In order, we  hammered into place the tibial tray and removed excess cement, the femoral component and removed excess cement,  The 44mm  Attune RP bearing  was inserted, and the knee brought to full extension with compression.  The patellar button was clamped into place, and excess cement  removed. While the cement cured the wound was irrigated out with normal saline solution pulse lavage. Ligament stability and patellar tracking were checked and found to be  excellent. The parapatellar arthrotomy was closed with  running #1 Vicryl suture. The subcutaneous tissue with 0 and 2-0 undyed  Vicryl suture, and the skin with running 3-0 SQ vicryl. A dressing of Xeroform,  4 x 4, dressing sponges, Webril, and Ace wrap applied. The patient  awakened, extubated, and taken to recovery room without difficulty.   Arron Mcnaught J 04/08/2015, 10:31 AM

## 2015-04-08 NOTE — Progress Notes (Signed)
Orthopedic Tech Progress Note Patient Details:  Linda Washington 10-06-1951 [id number]  CPM Left Knee CPM Left Knee: On Left Knee Flexion (Degrees): 40 Left Knee Extension (Degrees): 0 Additional Comments: trapeze bar patient helper Viewed order from doctor's order list  Hildred Priest 04/08/2015, 11:33 AM

## 2015-04-08 NOTE — Progress Notes (Signed)
CBG 180 

## 2015-04-08 NOTE — Plan of Care (Signed)
Problem: Consults Goal: Diagnosis- Total Joint Replacement Outcome: Completed/Met Date Met:  04/08/15 Primary Total Knee left     

## 2015-04-09 ENCOUNTER — Encounter (HOSPITAL_COMMUNITY): Payer: Self-pay | Admitting: Orthopedic Surgery

## 2015-04-09 LAB — CBC
HCT: 34.9 % — ABNORMAL LOW (ref 36.0–46.0)
Hemoglobin: 11.1 g/dL — ABNORMAL LOW (ref 12.0–15.0)
MCH: 29.3 pg (ref 26.0–34.0)
MCHC: 31.8 g/dL (ref 30.0–36.0)
MCV: 92.1 fL (ref 78.0–100.0)
Platelets: 306 10*3/uL (ref 150–400)
RBC: 3.79 MIL/uL — ABNORMAL LOW (ref 3.87–5.11)
RDW: 13.9 % (ref 11.5–15.5)
WBC: 12.5 10*3/uL — AB (ref 4.0–10.5)

## 2015-04-09 LAB — HEMOGLOBIN A1C
Hgb A1c MFr Bld: 6.4 % — ABNORMAL HIGH (ref 4.8–5.6)
Mean Plasma Glucose: 137 mg/dL

## 2015-04-09 LAB — BASIC METABOLIC PANEL
Anion gap: 7 (ref 5–15)
BUN: 12 mg/dL (ref 6–20)
CALCIUM: 8.6 mg/dL — AB (ref 8.9–10.3)
CO2: 27 mmol/L (ref 22–32)
Chloride: 101 mmol/L (ref 101–111)
Creatinine, Ser: 0.72 mg/dL (ref 0.44–1.00)
GFR calc Af Amer: >60 mL/min (ref >60)
Glucose, Bld: 175 mg/dL — ABNORMAL HIGH (ref 65–99)
Potassium: 4.7 mmol/L (ref 3.5–5.1)
SODIUM: 135 mmol/L (ref 135–145)

## 2015-04-09 LAB — GLUCOSE, CAPILLARY
GLUCOSE-CAPILLARY: 177 mg/dL — AB (ref 65–99)
GLUCOSE-CAPILLARY: 165 mg/dL — AB (ref 65–99)
Glucose-Capillary: 172 mg/dL — ABNORMAL HIGH (ref 65–99)
Glucose-Capillary: 125 mg/dL — ABNORMAL HIGH (ref 65–99)

## 2015-04-09 NOTE — Progress Notes (Signed)
OT Cancellation Note  Patient Details Name: Linda Washington MRN: [id number] DOB: [birth year]   Cancelled Treatment:    Reason Eval/Treat Not Completed: Pain limiting ability to participate. Pt reports she completed PT session and UB/LB bathing this morning and had just returned to bed. Requests OT to reattempt evaluation. OT to reattempt as schedule permits.   Hortencia Pilar 04/09/2015, 11:44 AM

## 2015-04-09 NOTE — Progress Notes (Signed)
Physical Therapy Treatment Patient Details Name: Linda Washington MRN: [id number] DOB: [birth year] Today's Date: 04/09/2015    History of Present Illness Pt is a 64 y/o F s/p L TKA.  Pt's PMH includes tremor, incontinence, neuropathy, restless leg syndrome, anxiety, HTN, B carpal tunnel release.    PT Comments    Patient is making good progress with PT.  Ambulated to bathroom and back to recliner and performed therapeutic exercises.  Pt will benefit from continued skilled PT services to increase functional independence and safety.     Follow Up Recommendations  SNF     Equipment Recommendations  Other (comment) (TBD by next venue of care)    Recommendations for Other Services       Precautions / Restrictions Precautions Precautions: Fall;Knee Precaution Comments: reviewed Restrictions Weight Bearing Restrictions: Yes LLE Weight Bearing: Weight bearing as tolerated    Mobility  Bed Mobility Overal bed mobility: Needs Assistance Bed Mobility: Supine to Sit     Supine to sit: Min assist     General bed mobility comments: Min assist managing LLE OOB  Transfers Overall transfer level: Needs assistance Equipment used: Rolling walker (2 wheeled) Transfers: Sit to/from Stand Sit to Stand: Min assist         General transfer comment: Min assist to maintain balance upon standing.  Ambulation/Gait Ambulation/Gait assistance: Min guard Ambulation Distance (Feet): 35 Feet Assistive device: Rolling walker (2 wheeled) Gait Pattern/deviations: Step-to pattern;Antalgic;Decreased stride length;Decreased stance time - left   Gait velocity interpretation: Below normal speed for age/gender General Gait Details: Good carryover from previous session.  Inc WB through [place] to offload LLE   Stairs            Wheelchair Mobility    Modified Rankin (Stroke Patients Only)       Balance Overall balance assessment: Needs assistance Sitting-balance support: No upper  extremity supported;Feet supported Sitting balance-Leahy Scale: Good     Standing balance support: Bilateral upper extremity supported;During functional activity Standing balance-Leahy Scale: Poor Standing balance comment: rw/external support for balance                    Cognition Arousal/Alertness: Awake/alert Behavior During Therapy: WFL for tasks assessed/performed Overall Cognitive Status: Within Functional Limits for tasks assessed                      Exercises Total Joint Exercises Ankle Circles/Pumps: AROM;Both;15 reps;Supine Quad Sets: AROM;Both;5 reps;Supine Long Arc Quad: AROM;Left;5 reps;Seated    General Comments        Pertinent Vitals/Pain Pain Assessment: 0-10 Pain Score: 6  Pain Location: L knee Pain Descriptors / Indicators: Aching Pain Intervention(s): Limited activity within patient's tolerance;Monitored during session;Repositioned    Home Living Family/patient expects to be discharged to:: Skilled nursing facility Living Arrangements: Non-relatives/Friends                  Prior Function Level of Independence: Independent with assistive device(s)      Comments: Cane when L knee was hurting   PT Goals (current goals can now be found in the care plan section) Acute Rehab PT Goals Patient Stated Goal: to go to rehab then home PT Goal Formulation: With patient Time For Goal Achievement: 04/15/15 Potential to Achieve Goals: Good Progress towards PT goals: Progressing toward goals    Frequency  7X/week    PT Plan Current plan remains appropriate    Co-evaluation  End of Session Equipment Utilized During Treatment: Gait belt Activity Tolerance: Patient tolerated treatment well Patient left: in chair;with call bell/phone within reach;with nursing/sitter in room     Time: 2341-4436 PT Time Calculation (min) (ACUTE ONLY): 13 min  Charges:  $Gait Training: 8-22 mins                    G Codes:       Joslyn Hy PT, [place] [phone] Pager: [phone] 04/09/2015, 4:50 PM

## 2015-04-09 NOTE — Progress Notes (Signed)
CBG: 177. RN notified.

## 2015-04-09 NOTE — Progress Notes (Signed)
Occupational Therapy Evaluation Patient Details Name: Linda Washington MRN: [id number] DOB: 10/03/1951 Today's Date: 04/09/2015    History of Present Illness Pt is a 64 y/o F s/p L TKA.  Pt's PMH includes tremor, incontinence, neuropathy, restless leg syndrome, anxiety, HTN, B carpal tunnel release.   Clinical Impression   Pt admitted with the above diagnoses and presents with below problem list. Pt will benefit from continued OT to address the below listed deficits and maximize independence with BADLs prior to d/c to venue below. PTA pt was mod I with ADLs. Pt is currently min A with LB ADLs and transfers. ADLs completed and education provided as detailed below.      Follow Up Recommendations  SNF    Equipment Recommendations  Other (comment) (defer to next venue)    Recommendations for Other Services       Precautions / Restrictions Precautions Precautions: Fall;Knee Precaution Comments: reviewed Restrictions Weight Bearing Restrictions: Yes LLE Weight Bearing: Weight bearing as tolerated      Mobility Bed Mobility Overal bed mobility: Needs Assistance Bed Mobility: Supine to Sit     Supine to sit: Min assist     General bed mobility comments: not assessed  Transfers Overall transfer level: Needs assistance Equipment used: Rolling walker (2 wheeled) Transfers: Sit to/from Stand Sit to Stand: Min assist         General transfer comment: From EOB and 3n1. Cues for technique with rw.    Balance Overall balance assessment: Needs assistance Sitting-balance support: No upper extremity supported;Feet supported Sitting balance-Leahy Scale: Good     Standing balance support: Bilateral upper extremity supported;During functional activity Standing balance-Leahy Scale: Poor Standing balance comment: rw/external support for balance                            ADL Overall ADL's : Needs assistance/impaired Eating/Feeding: Set up;Sitting   Grooming: Min  guard;Standing;Wash/dry hands   Upper Body Bathing: Set up;Sitting   Lower Body Bathing: Minimal assistance;Sit to/from stand   Upper Body Dressing : Set up;Sitting   Lower Body Dressing: Minimal assistance;Sit to/from stand   Toilet Transfer: Minimal assistance;Ambulation;RW (3n1 over toilet) Toilet Transfer Details (indicate cue type and reason): Min A for balance Toileting- Clothing Manipulation and Hygiene: Minimal assistance;Sit to/from stand Toileting - Clothing Manipulation Details (indicate cue type and reason): min A for balance Tub/ Shower Transfer: Minimal assistance;Ambulation;Rolling walker;3 in 1   Functional mobility during ADLs: Minimal assistance;Rolling walker General ADL Comments: Pt ambulated to bathroom and completed toilet ransfer as detailed above. Pt then completed hand washing standing at sink min guard. Min A for LB ADLs and transfers due to balance. ADL edcucation including AE and compensatory techniques provided.      Vision     Perception     Praxis      Pertinent Vitals/Pain Pain Assessment: 0-10 Pain Score: 4  Pain Location: L knee Pain Descriptors / Indicators: Aching Pain Intervention(s): Limited activity within patient's tolerance;Monitored during session;Premedicated before session;Repositioned     Hand Dominance     Extremity/Trunk Assessment Upper Extremity Assessment Upper Extremity Assessment: Overall WFL for tasks assessed   Lower Extremity Assessment Lower Extremity Assessment: Defer to PT evaluation       Communication Communication Communication: No difficulties   Cognition Arousal/Alertness: Awake/alert Behavior During Therapy: WFL for tasks assessed/performed Overall Cognitive Status: Within Functional Limits for tasks assessed  General Comments          Shoulder Instructions      Home Living Family/patient expects to be discharged to:: Skilled nursing facility Living Arrangements:  Non-relatives/Friends                                      Prior Functioning/Environment Level of Independence: Independent with assistive device(s)        Comments: Cane when L knee was hurting    OT Diagnosis: Acute pain   OT Problem List: Impaired balance (sitting and/or standing);Decreased knowledge of use of DME or AE;Decreased knowledge of precautions;Pain   OT Treatment/Interventions: Self-care/ADL training;DME and/or AE instruction;Therapeutic activities;Patient/family education;Balance training    OT Goals(Current goals can be found in the care plan section) Acute Rehab OT Goals Patient Stated Goal: to go to rehab then home OT Goal Formulation: With patient Time For Goal Achievement: 04/16/15 Potential to Achieve Goals: Good ADL Goals Pt Will Perform Lower Body Dressing: with min guard assist;with adaptive equipment;sit to/from stand Pt Will Transfer to Toilet: ambulating;with supervision (3n1 over toilet) Pt Will Perform Toileting - Clothing Manipulation and hygiene: with supervision;sit to/from stand Pt Will Perform Tub/Shower Transfer: Shower transfer;3 in 1;ambulating;with modified independence  OT Frequency: Min 2X/week   Barriers to D/C:            Co-evaluation              End of Session Equipment Utilized During Treatment: Gait belt;Rolling walker CPM Left Knee Additional Comments: yellow foam under L heel  Activity Tolerance: Patient tolerated treatment well Patient left: in chair;with call bell/phone within reach   Time: 1257-1310 OT Time Calculation (min): 13 min Charges:  OT General Charges $OT Visit: 1 Procedure OT Evaluation $Initial OT Evaluation Tier I: 1 Procedure G-Codes:    Linda Washington 05-01-2015, 1:28 PM

## 2015-04-09 NOTE — Progress Notes (Signed)
Physical Therapy Treatment Patient Details Name: Linda Washington MRN: [id number] DOB: 1950-10-23 Today's Date: 04/09/2015    History of Present Illness Pt is a 64 y/o F s/p L TKA.  Pt's PMH includes tremor, incontinence, neuropathy, restless leg syndrome, anxiety, HTN, B carpal tunnel release.    PT Comments    Pt made good progress w/ PT this session; however, demonstrated L knee buckle upon standing EOB and x3 walking to bathroom.  Pt able to stabilize each time using RW and w/ min assist for support.  VCs were provided to activate L quad prior to stance phase and no further knee buckling was noted.  Pt will benefit from continued skilled PT services to increase functional independence and safety.   Follow Up Recommendations  SNF     Equipment Recommendations  Other (comment) (TBD by next venue of care)    Recommendations for Other Services       Precautions / Restrictions Precautions Precautions: Fall;Knee Precaution Comments: Reviewed no pillow under knee Restrictions Weight Bearing Restrictions: Yes LLE Weight Bearing: Weight bearing as tolerated    Mobility  Bed Mobility Overal bed mobility: Needs Assistance Bed Mobility: Supine to Sit     Supine to sit: Min assist     General bed mobility comments: Min assist managing LLE OOB.  Pt performs supine>sit quickly w/ use of bed rail  Transfers Overall transfer level: Needs assistance Equipment used: Rolling walker (2 wheeled) Transfers: Sit to/from Stand Sit to Stand: Min assist         General transfer comment: Pt is not impulsive but stands very quickly causing her L knee to buckle, pt able to maintain balance w/ min assist and use of RW.    Ambulation/Gait Ambulation/Gait assistance: Min assist Ambulation Distance (Feet): 60 Feet Assistive device: Rolling walker (2 wheeled) Gait Pattern/deviations: Step-to pattern;Antalgic;Trunk flexed;Decreased weight shift to left   Gait velocity interpretation: Below  normal speed for age/gender General Gait Details: Pt w/ L knee buckle x3 walking to bathroom but able to stabilize using RW and min assist.  VCs to relax shoulder, pt relying heavily on RW to offload LLE.  Cues to activate quad and straighten LLE prior to stance phase which prevented further L knee buckle.    Stairs            Wheelchair Mobility    Modified Rankin (Stroke Patients Only)       Balance Overall balance assessment: Needs assistance Sitting-balance support: No upper extremity supported;Feet supported Sitting balance-Leahy Scale: Good     Standing balance support: Bilateral upper extremity supported;During functional activity Standing balance-Leahy Scale: Poor                      Cognition Arousal/Alertness: Awake/alert Behavior During Therapy: WFL for tasks assessed/performed Overall Cognitive Status: Within Functional Limits for tasks assessed                      Exercises Total Joint Exercises Ankle Circles/Pumps: AROM;Both;15 reps;Supine Quad Sets: AROM;Both;5 reps;Supine Long Arc Quad: Left;AAROM;5 reps;Seated Knee Flexion: AROM;Left;5 reps;Seated Goniometric ROM: 92 deg L knee F    General Comments        Pertinent Vitals/Pain Pain Assessment: 0-10 Pain Score: 4  Pain Location: L Knee Pain Descriptors / Indicators: Aching Pain Intervention(s): Limited activity within patient's tolerance;Monitored during session;Repositioned    Home Living  Prior Function            PT Goals (current goals can now be found in the care plan section) Acute Rehab PT Goals Patient Stated Goal: to go to rehab then home PT Goal Formulation: With patient Time For Goal Achievement: 04/15/15 Potential to Achieve Goals: Good Progress towards PT goals: Progressing toward goals    Frequency  7X/week    PT Plan Current plan remains appropriate    Co-evaluation             End of Session Equipment  Utilized During Treatment: Gait belt Activity Tolerance: Patient tolerated treatment well;Patient limited by fatigue Patient left: in chair;with call bell/phone within reach     Time: 1000-1023 PT Time Calculation (min) (ACUTE ONLY): 23 min  Charges:  $Gait Training: 8-22 mins $Therapeutic Exercise: 8-22 mins                    G Codes:      Joslyn Hy PT, [place] [phone] Pager: [phone] 04/09/2015, 11:55 AM

## 2015-04-09 NOTE — Clinical Social Work Note (Signed)
Clinical Social Work Assessment  Patient Details  Name: Linda Washington MRN: [id number] Date of Birth: 01/10/1951  Date of referral:  04/09/15               Reason for consult:  Facility Placement, Discharge Planning                Permission sought to share information with:  Chartered certified accountant granted to share information::  Yes, Verbal Permission Granted  Name::     n/a  Agency::  [facility]  Relationship::  n/a  Contact Information:  n/a  Housing/Transportation Living arrangements for the past 2 months:  Single Family Home Source of Information:  Patient Patient Interpreter Needed:  None Criminal Activity/Legal Involvement Pertinent to Current Situation/Hospitalization:  No - Comment as needed Significant Relationships:  Siblings Lives with:  Self Do you feel safe going back to the place where you live?  No (High fall risk.) Need for family participation in patient care:  No (Coment) (Patient able to make own decisions.)  Care giving concerns:  Patient expressed no concerns at this time.   Social Worker assessment / plan:  CSW received referral for possible SNF placement at time of discharge. CSW met with patient at bedside to discuss discharge disposition. Patient confirmed patient to discharge to [facility] once medically stable for discharge.   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  Patient/Family's Understanding of and Emotional Response to Diagnosis, Current Treatment, and Prognosis:  Patient understanding and agreeable to CSW plan of care.  Emotional Assessment Appearance:  Appears stated age Attitude/Demeanor/Rapport:  Other (Pleasant.) Affect (typically observed):   Pleasant, Appropriate, Accepting Orientation:  Oriented to Self, Oriented to Place, Oriented to  Time, Oriented to Situation Alcohol / Substance use:  Not Applicable Psych involvement (Current and /or in the community):  No (Comment) (Not appropriate on this admission.)  Discharge Needs  Concerns to be addressed:  No discharge needs identified Readmission within the last 30 days:  No Current discharge risk:  None Barriers to Discharge:  No Barriers Identified   Caroline Sauger, LCSW 04/09/2015, 1:23 PM [phone]

## 2015-04-09 NOTE — Progress Notes (Signed)
Patient ID: Linda Washington, female   DOB: Jan 22, 1951, 64 y.o.   MRN: [id number] PATIENT ID: Linda Washington  MRN: [id number]  DOB/AGE:  [birth year] / 64 y.o.  1 Day Post-Op Procedure(s) (LRB): LEFT TOTAL KNEE ARTHROPLASTY (Left)    PROGRESS NOTE Subjective: Patient is alert, oriented, x2 Nausea, x1 Vomiting, yes passing gas, no Bowel Movement. Taking PO sips. Denies SOB, Chest or Calf Pain. Using Incentive Spirometer, PAS in place. Ambulate 35 ft, CPM 0-40 Patient reports pain as 4 on 0-10 scale  .    Objective: Vital signs in last 24 hours: Filed Vitals:   04/08/15 1150 04/08/15 1217 04/08/15 1953 04/09/15 0555  BP: 122/82 140/44 149/63 122/46  Pulse: 75 64 106 90  Temp: 98 F (36.7 C) 98 F (36.7 C) 98.2 F (36.8 C) 99.3 F (37.4 C)  TempSrc:  Oral Oral Oral  Resp: 16 18    Weight:      SpO2: 99% 100% 96% 97%      Intake/Output from previous day: I/O last 3 completed shifts: In: 1240 [P.O.:240; I.V.:1000] Out: 200 [Blood:200]   Intake/Output this shift: Total I/O In: 920.8 [I.V.:920.8] Out: 300 [Urine:300]   LABORATORY DATA:  Recent Labs  04/08/15 1650 04/08/15 2133 04/09/15 0601  GLUCAP 174* 180* 177*    Examination: Neurologically intact ABD soft Neurovascular intact Sensation intact distally Intact pulses distally Dorsiflexion/Plantar flexion intact Incision: dressing C/D/I No cellulitis present Compartment soft}  Assessment:   1 Day Post-Op Procedure(s) (LRB): LEFT TOTAL KNEE ARTHROPLASTY (Left) ADDITIONAL DIAGNOSIS: Expected Acute Blood Loss Anemia, A Flutter, Diabetes, morbid obesity  Plan: PT/OT WBAT, CPM 5/hrs day until ROM 0-90 degrees, then D/C CPM DVT Prophylaxis:  SCDx72hrs, ASA 325 mg BID x 2 weeks DISCHARGE PLAN: Skilled Nursing Facility/Rehab, [facility] DISCHARGE NEEDS: HHPT, CPM, Walker and 3-in-1 comode seat     Jahmal Dunavant J 04/09/2015, 6:56 AM

## 2015-04-10 LAB — CBC
HCT: 32.6 % — ABNORMAL LOW (ref 36.0–46.0)
Hemoglobin: 10.3 g/dL — ABNORMAL LOW (ref 12.0–15.0)
MCH: 28.9 pg (ref 26.0–34.0)
MCHC: 31.6 g/dL (ref 30.0–36.0)
MCV: 91.3 fL (ref 78.0–100.0)
Platelets: 278 10*3/uL (ref 150–400)
RBC: 3.57 MIL/uL — ABNORMAL LOW (ref 3.87–5.11)
RDW: 13.9 % (ref 11.5–15.5)
WBC: 12.5 10*3/uL — ABNORMAL HIGH (ref 4.0–10.5)

## 2015-04-10 LAB — GLUCOSE, CAPILLARY
GLUCOSE-CAPILLARY: 132 mg/dL — AB (ref 65–99)
Glucose-Capillary: 119 mg/dL — ABNORMAL HIGH (ref 65–99)

## 2015-04-10 MED ORDER — METHOCARBAMOL 500 MG PO TABS: 500.0000 mg | ORAL_TABLET | Freq: Two times a day (BID) | ORAL | Status: DC

## 2015-04-10 MED ORDER — ASPIRIN EC 325 MG PO TBEC: 325.0000 mg | DELAYED_RELEASE_TABLET | Freq: Two times a day (BID) | ORAL | Status: DC

## 2015-04-10 MED ORDER — RIVAROXABAN 10 MG PO TABS: 10.0000 mg | ORAL_TABLET | Freq: Every day | ORAL | Status: DC

## 2015-04-10 MED ORDER — HYDROMORPHONE HCL 4 MG PO TABS: 4.0000 mg | ORAL_TABLET | ORAL | Status: DC | PRN

## 2015-04-10 NOTE — Clinical Social Work Placement (Signed)
   CLINICAL SOCIAL WORK PLACEMENT  NOTE  Date:  04/10/2015  Patient Details  Name: Linda Washington MRN: [id number] Date of Birth: 1950-10-25  Clinical Social Work is seeking post-discharge placement for this patient at the [place] level of care (*CSW will initial, date and re-position this form in  chart as items are completed):  Yes   Patient/family provided with [place] Work Department's list of facilities offering this level of care within the geographic area requested by the patient (or if unable, by the patient's family).  Yes   Patient/family informed of their freedom to choose among providers that offer the needed level of care, that participate in Medicare, Medicaid or managed care program needed by the patient, have an available bed and are willing to accept the patient.  Yes   Patient/family informed of [facility]'s ownership interest in [facility] and [facility], as well as of the fact that they are under no obligation to receive care at these facilities.  PASRR submitted to EDS on 04/09/15     PASRR number received on 04/09/15     Existing PASRR number confirmed on  (n/a)     FL2 transmitted to all facilities in geographic area requested by pt/family on 04/09/15     FL2 transmitted to all facilities within larger geographic area on  (n/a)     Patient informed that his/her managed care company has contracts with or will negotiate with certain facilities, including the following:   (yes, [facility])     Yes   Patient/family informed of bed offers received.  Patient chooses bed at [facility]     Physician recommends and patient chooses bed at  (n/a)    Patient to be transferred to [facility] on 04/10/15.  Patient to be transferred to facility by [facility]     Patient family notified on 04/10/15 of transfer.  Name of family member notified:  Patient updated at bedside.     PHYSICIAN       Additional Comment:     _______________________________________________ Caroline Sauger, LCSW 04/10/2015, 11:37 AM [phone]

## 2015-04-10 NOTE — Discharge Planning (Signed)
Patient to be discharged to [facility]. Patient updated at bedside.  [employer] auth: [id number]  Facility: [facility] RN report number: [id number] Transportation: EMS ([address]  Lubertha Sayres, [place] [phone]) and Surgical [phone])

## 2015-04-10 NOTE — Progress Notes (Signed)
Physical Therapy Treatment Patient Details Name: Linda Washington MRN: [id number] DOB: 14-Sep-1951 Today's Date: 04/10/2015    History of Present Illness Pt is a 64 y/o F s/p L TKA.  Pt's PMH includes tremor, incontinence, neuropathy, restless leg syndrome, anxiety, HTN, B carpal tunnel release.    PT Comments    Patient is making good progress with PT.  Pt completed therapeutic exercises and ambulated 40 ft in room w/ min guard assist.  Pt will benefit from continued skilled PT services to increase functional independence and safety.   Follow Up Recommendations  SNF     Equipment Recommendations  Other (comment) (TBD by next venue of care)    Recommendations for Other Services       Precautions / Restrictions Precautions Precautions: Fall;Knee Precaution Comments: reviewed Restrictions Weight Bearing Restrictions: Yes LLE Weight Bearing: Weight bearing as tolerated    Mobility  Bed Mobility Overal bed mobility: Needs Assistance Bed Mobility: Supine to Sit     Supine to sit: Min assist     General bed mobility comments: in recliner  Transfers Overall transfer level: Needs assistance Equipment used: Rolling walker (2 wheeled) Transfers: Sit to/from Stand Sit to Stand: Supervision         General transfer comment: Supervision for safety.  Good carryover from previous sessions.  Ambulation/Gait Ambulation/Gait assistance: Min guard Ambulation Distance (Feet): 40 Feet Assistive device: Rolling walker (2 wheeled) Gait Pattern/deviations: Step-through pattern;Antalgic;Decreased stance time - left;Decreased weight shift to left   Gait velocity interpretation: Below normal speed for age/gender General Gait Details: Good carryover w/ improved upright posture and step through gait pattern.   Stairs            Wheelchair Mobility    Modified Rankin (Stroke Patients Only)       Balance Overall balance assessment: Needs assistance Sitting-balance support:  Feet supported;No upper extremity supported Sitting balance-Leahy Scale: Good     Standing balance support: During functional activity;No upper extremity supported Standing balance-Leahy Scale: Fair Standing balance comment: Pt able to adjust gown in standing w/o either UE supported                    Cognition Arousal/Alertness: Awake/alert Behavior During Therapy: WFL for tasks assessed/performed Overall Cognitive Status: Within Functional Limits for tasks assessed                      Exercises Total Joint Exercises Ankle Circles/Pumps: AROM;Both;15 reps;Supine Quad Sets: AROM;Both;5 reps;Supine Heel Slides: AROM;Left;5 reps;Seated Hip ABduction/ADduction: AROM;Left;5 reps;Seated Long Arc Quad: AROM;Left;5 reps;Seated Knee Flexion: AROM;Left;5 reps;Seated Goniometric ROM: 92 deg L knee F    General Comments        Pertinent Vitals/Pain Pain Assessment: 0-10 Pain Score: 5  Pain Location: L knee Pain Descriptors / Indicators: Discomfort Pain Intervention(s): Limited activity within patient's tolerance;Monitored during session;Repositioned    Home Living                      Prior Function            PT Goals (current goals can now be found in the care plan section) Acute Rehab PT Goals Patient Stated Goal: to go to rehab then home PT Goal Formulation: With patient Time For Goal Achievement: 04/15/15 Potential to Achieve Goals: Good Progress towards PT goals: Progressing toward goals    Frequency  7X/week    PT Plan Current plan remains appropriate    Co-evaluation  End of Session Equipment Utilized During Treatment: Gait belt Activity Tolerance: Patient tolerated treatment well Patient left: in chair;with call bell/phone within reach;with family/visitor present     Time: 0301-4996 PT Time Calculation (min) (ACUTE ONLY): 12 min  Charges:  $Gait Training: 8-22 mins $Therapeutic Exercise: 8-22 mins                     G Codes:      Joslyn Hy PT, [place] [phone] Pager: [phone] 04/10/2015, 12:52 PM

## 2015-04-10 NOTE — Progress Notes (Signed)
PATIENT ID: Linda Washington  MRN: [id number]  DOB/AGE:  01-01-1951 / 64 y.o.  2 Days Post-Op Procedure(s) (LRB): LEFT TOTAL KNEE ARTHROPLASTY (Left)    PROGRESS NOTE Subjective: Patient is alert, oriented, no Nausea, no Vomiting, yes passing gas, no Bowel Movement. Taking PO well with pt eating breakfast. Denies SOB, Chest or Calf Pain. Using Incentive Spirometer, PAS in place. Ambulate WBAT, CPM 0-60 Patient reports pain as 5 on 0-10 scale  .    Objective: Vital signs in last 24 hours: Filed Vitals:   04/09/15 0555 04/09/15 1402 04/09/15 2142 04/10/15 0449  BP: 122/46 111/48 100/52 123/62  Pulse: 90 73 94 93  Temp: 99.3 F (37.4 C) 98.8 F (37.1 C) 98.2 F (36.8 C) 97.9 F (36.6 C)  TempSrc: Oral Oral Oral Oral  Resp:  18 18 18   Weight:      SpO2: 97% 94% 94% 97%      Intake/Output from previous day: I/O last 3 completed shifts: In: 1640.8 [P.O.:720; I.V.:920.8] Out: 300 [Urine:300]   Intake/Output this shift:     LABORATORY DATA:  Recent Labs  04/09/15 0705  04/09/15 1640 04/09/15 2141 04/10/15 0600 04/10/15 0700  WBC 12.5*  --   --   --  12.5*  --   HGB 11.1*  --   --   --  10.3*  --   HCT 34.9*  --   --   --  32.6*  --   PLT 306  --   --   --  278  --   NA 135  --   --   --   --   --   K 4.7  --   --   --   --   --   CL 101  --   --   --   --   --   CO2 27  --   --   --   --   --   BUN 12  --   --   --   --   --   CREATININE 0.72  --   --   --   --   --   GLUCOSE 175*  --   --   --   --   --   GLUCAP  --   < > 125* 165*  --  132*  CALCIUM 8.6*  --   --   --   --   --   < > = values in this interval not displayed.  Examination: Neurologically intact Neurovascular intact Sensation intact distally Intact pulses distally Dorsiflexion/Plantar flexion intact Incision: dressing C/D/I No cellulitis present Compartment soft}  Assessment:   2 Days Post-Op Procedure(s) (LRB): LEFT TOTAL KNEE ARTHROPLASTY (Left) ADDITIONAL DIAGNOSIS: Expected Acute Blood  Loss Anemia, A Flutter, Diabetes, morbid obesity  Plan: PT/OT WBAT, CPM 5/hrs day until ROM 0-90 degrees, then D/C CPM DVT Prophylaxis:  SCDx72hrs, ASA 325 mg BID x 2 weeks DISCHARGE PLAN: Skilled Nursing Facility/Rehab, [facility] when bed available DISCHARGE NEEDS: HHPT, HHRN, CPM, Walker and 3-in-1 comode seat     Azyriah Nevins R 04/10/2015, 7:49 AM

## 2015-04-10 NOTE — Discharge Summary (Signed)
Patient ID: Linda Washington MRN: [id number] DOB/AGE: 1951-01-21 64 y.o.  Admit date: 04/08/2015 Discharge date: 04/10/2015  Admission Diagnoses:  Principal Problem:   Primary osteoarthritis of left knee Active Problems:   Arthritis of knee   Discharge Diagnoses:  Same  Past Medical History  Diagnosis Date  . Tremor   . Incontinence     takes Ditropan daily  . Neuropathy     takes Gabapentin daily  . RLS (restless legs syndrome)   . Basal cell carcinoma   . Hypothyroidism   . Hyperlipemia     takes Simvastatin daily  . Anxiety     takes Citalopram daily  . GERD (gastroesophageal reflux disease)     takes Omeprazole daily  . Hypertension     takes Enalapril daily  . Type 2 diabetes mellitus     takes Metformin and Amaryl daily  . Seasonal allergies     takes Claritin daioy as needed  . PONV (postoperative nausea and vomiting)   . History of Bell's palsy     left side  . History of bronchitis   . History of hiatal hernia   . Arthritis     generalized  . Joint swelling     left knee  . History of kidney stones   . Sleep apnea     pt. states that she does not have a CPAP    Surgeries: Procedure(s): LEFT TOTAL KNEE ARTHROPLASTY on 04/08/2015   Consultants:    Discharged Condition: Improved  Hospital Course: Linda Washington is an 65 y.o. female who was admitted 04/08/2015 for operative treatment ofPrimary osteoarthritis of left knee. Patient has severe unremitting pain that affects sleep, daily activities, and work/hobbies. After pre-op clearance the patient was taken to the operating room on 04/08/2015 and underwent  Procedure(s): LEFT TOTAL KNEE ARTHROPLASTY.    Patient was given perioperative antibiotics: Anti-infectives    Start     Dose/Rate Route Frequency Ordered Stop   04/08/15 0900  cefUROXime (ZINACEF) injection  Status:  Discontinued       As needed 04/08/15 0932 04/08/15 1101   04/08/15 0845  ceFAZolin (ANCEF) 3 g in dextrose 5 % 50 mL IVPB     3 g 160  mL/hr over 30 Minutes Intravenous To Short Stay 04/07/15 1428 04/08/15 0845       Patient was given sequential compression devices, early ambulation, and chemoprophylaxis to prevent DVT.  Patient benefited maximally from hospital stay and there were no complications.    Recent vital signs: Patient Vitals for the past 24 hrs:  BP Temp Temp src Pulse Resp SpO2  04/10/15 0449 123/62 mmHg 97.9 F (36.6 C) Oral 93 18 97 %  04/09/15 2142 (!) 100/52 mmHg 98.2 F (36.8 C) Oral 94 18 94 %  04/09/15 1402 (!) 111/48 mmHg 98.8 F (37.1 C) Oral 73 18 94 %     Recent laboratory studies:  Recent Labs  04/09/15 0705 04/10/15 0600  WBC 12.5* 12.5*  HGB 11.1* 10.3*  HCT 34.9* 32.6*  PLT 306 278  NA 135  --   K 4.7  --   CL 101  --   CO2 27  --   BUN 12  --   CREATININE 0.72  --   GLUCOSE 175*  --   CALCIUM 8.6*  --      Discharge Medications:     Medication List    TAKE these medications        citalopram 20 MG tablet  Commonly known as:  CELEXA  Take 20 mg by mouth daily.     enalapril 10 MG tablet  Commonly known as:  VASOTEC  Take 10 mg by mouth at bedtime.     EYE MOISTURIZING RELIEF OP  Apply 1-2 drops to eye daily as needed (eye pain, dry eyes).     gabapentin 400 MG capsule  Commonly known as:  NEURONTIN  Take 1-2 capsules (400-800 mg total) by mouth 3 (three) times daily.     glimepiride 1 MG tablet  Commonly known as:  AMARYL  Take 1 mg by mouth daily with breakfast.     HYDROmorphone 4 MG tablet  Commonly known as:  DILAUDID  Take 1 tablet (4 mg total) by mouth every 3 (three) hours as needed for severe pain.     loratadine 10 MG tablet  Commonly known as:  CLARITIN  Take 10 mg by mouth daily as needed for allergies or rhinitis.     metFORMIN 1000 MG tablet  Commonly known as:  GLUCOPHAGE  Take 1,000 mg by mouth 2 (two) times daily with a meal.     methocarbamol 500 MG tablet  Commonly known as:  ROBAXIN  Take 1 tablet (500 mg total) by mouth 2  (two) times daily with a meal.     omeprazole 40 MG capsule  Commonly known as:  PRILOSEC  Take 40 mg by mouth daily.     oxybutynin 10 MG 24 hr tablet  Commonly known as:  DITROPAN-XL  Take 10 mg by mouth daily.     rivaroxaban 10 MG Tabs tablet  Commonly known as:  XARELTO  Take 1 tablet (10 mg total) by mouth daily with breakfast.     simvastatin 20 MG tablet  Commonly known as:  ZOCOR  Take 20 mg by mouth daily at 6 PM.     traMADol 50 MG tablet  Commonly known as:  ULTRAM  Take 50 mg by mouth every 6 (six) hours as needed for moderate pain or severe pain.     Vitamin D (Ergocalciferol) 50000 UNITS Caps capsule  Commonly known as:  DRISDOL  Take 50,000 Units by mouth every 7 (seven) days. Takes on Mondays        Diagnostic Studies: Dg Chest 2 View  03/28/2015   CLINICAL DATA:  Preop for left total knee arthroplasty.  EXAM: CHEST  2 VIEW  COMPARISON:  None.  FINDINGS: Normal mediastinum and cardiac silhouette. Normal pulmonary vasculature. No evidence of effusion, infiltrate, or pneumothorax. No acute bony abnormality. Degenerative osteophytosis of the thoracic spine.  IMPRESSION: No acute cardiopulmonary process.   Electronically Signed   By: Suzy Bouchard M.D.   On: 03/28/2015 15:54    Disposition:       Discharge Instructions    CPM    Complete by:  As directed   Continuous passive motion machine (CPM):      Use the CPM from 0 to 60  for 5 hours per day.      You may increase by 10 degrees per day.  You may break it up into 2 or 3 sessions per day.      Use CPM for 2 weeks or until you are told to stop.     Call MD / Call 911    Complete by:  As directed   If you experience chest pain or shortness of breath, CALL 911 and be transported to the hospital emergency room.  If you develope a fever  above 101 F, pus (white drainage) or increased drainage or redness at the wound, or calf pain, call your surgeon's office.     Change dressing    Complete by:  As directed    Change dressing on day 5, then change the dressing daily with sterile 4 x 4 inch gauze dressing and apply TED hose.  You may clean the incision with alcohol prior to redressing.     Constipation Prevention    Complete by:  As directed   Drink plenty of fluids.  Prune juice may be helpful.  You may use a stool softener, such as Colace (over the counter) 100 mg twice a day.  Use MiraLax (over the counter) for constipation as needed.     Diet - low sodium heart healthy    Complete by:  As directed      Discharge instructions    Complete by:  As directed   Follow up in office with Dr. Mayer Camel in 2 weeks.     Driving restrictions    Complete by:  As directed   No driving for 2 weeks     Increase activity slowly as tolerated    Complete by:  As directed      Patient may shower    Complete by:  As directed   You may shower without a dressing once there is no drainage.  Do not wash over the wound.  If drainage remains, cover wound with plastic wrap and then shower.           Follow-up Information    Follow up with Kerin Salen, MD In 2 weeks.   Specialty:  Orthopedic Surgery   Contact information:   [place] [address] [phone]        Signed: Theodosia Quay 04/10/2015, 7:58 AM

## 2015-04-10 NOTE — Progress Notes (Signed)
Physical Therapy Treatment Patient Details Name: Linda Washington MRN: [id number] DOB: 06/22/1951 Today's Date: 04/10/2015    History of Present Illness Pt is a 64 y/o F s/p L TKA.  Pt's PMH includes tremor, incontinence, neuropathy, restless leg syndrome, anxiety, HTN, B carpal tunnel release.    PT Comments    Pt making good progress w/ PT and ambulated 60 ft this session w/ improved mechanics (see notes below).  Pt will benefit from continued skilled PT services to increase functional independence and safety.  Follow Up Recommendations  SNF     Equipment Recommendations  Other (comment) (TBD by next venue of care)    Recommendations for Other Services       Precautions / Restrictions Precautions Precautions: Fall;Knee Precaution Comments: reviewed Restrictions Weight Bearing Restrictions: Yes LLE Weight Bearing: Weight bearing as tolerated    Mobility  Bed Mobility Overal bed mobility: Needs Assistance Bed Mobility: Supine to Sit     Supine to sit: Min assist     General bed mobility comments: Min assist managing LLE OOB  Transfers Overall transfer level: Needs assistance Equipment used: Rolling walker (2 wheeled) Transfers: Sit to/from Stand Sit to Stand: Supervision         General transfer comment: Supervision for safety.  Good carryover from previous sessions.  Ambulation/Gait Ambulation/Gait assistance: Min guard Ambulation Distance (Feet): 60 Feet Assistive device: Rolling walker (2 wheeled) Gait Pattern/deviations: Step-to pattern;Step-through pattern;Antalgic;Trunk flexed;Decreased stride length;Decreased stance time - left   Gait velocity interpretation: Below normal speed for age/gender General Gait Details: VCs to perform step through gait pattern which pt demonstrated successfully w/ a resultant dec in BUE WB and upright posture.   Stairs            Wheelchair Mobility    Modified Rankin (Stroke Patients Only)       Balance  Overall balance assessment: Needs assistance Sitting-balance support: No upper extremity supported;Feet supported Sitting balance-Leahy Scale: Good     Standing balance support: Bilateral upper extremity supported;During functional activity Standing balance-Leahy Scale: Fair                      Cognition Arousal/Alertness: Awake/alert Behavior During Therapy: WFL for tasks assessed/performed Overall Cognitive Status: Within Functional Limits for tasks assessed                      Exercises Total Joint Exercises Ankle Circles/Pumps: AROM;Both;15 reps;Supine Quad Sets: AROM;Both;5 reps;Supine Long Arc Quad: AROM;Left;5 reps;Seated Knee Flexion: AROM;Left;5 reps;Seated Goniometric ROM: 92 deg L knee F    General Comments        Pertinent Vitals/Pain Pain Assessment: 0-10 Pain Score: 4  Pain Location: L knee Pain Descriptors / Indicators: Aching;Discomfort Pain Intervention(s): Limited activity within patient's tolerance;Monitored during session;Repositioned    Home Living                      Prior Function            PT Goals (current goals can now be found in the care plan section) Acute Rehab PT Goals Patient Stated Goal: to go to rehab then home PT Goal Formulation: With patient Time For Goal Achievement: 04/15/15 Potential to Achieve Goals: Good Progress towards PT goals: Progressing toward goals    Frequency  7X/week    PT Plan Current plan remains appropriate    Co-evaluation             End of  Session Equipment Utilized During Treatment: Gait belt Activity Tolerance: Patient tolerated treatment well Patient left: in chair;with call bell/phone within reach     Time: 0854-0919 PT Time Calculation (min) (ACUTE ONLY): 25 min  Charges:  $Gait Training: 8-22 mins $Therapeutic Exercise: 8-22 mins                    G Codes:      Joslyn Hy PT, [place] [phone] Pager: [phone] 04/10/2015, 11:00 AM

## 2015-04-11 ENCOUNTER — Non-Acute Institutional Stay: Payer: PPO | Admitting: Adult Health

## 2015-04-11 ENCOUNTER — Encounter: Payer: Self-pay | Admitting: Adult Health

## 2015-04-11 DIAGNOSIS — J309 Allergic rhinitis, unspecified: Secondary | ICD-10-CM | POA: Diagnosis not present

## 2015-04-11 DIAGNOSIS — N3281 Overactive bladder: Secondary | ICD-10-CM

## 2015-04-11 DIAGNOSIS — M1712 Unilateral primary osteoarthritis, left knee: Secondary | ICD-10-CM

## 2015-04-11 DIAGNOSIS — F32A Depression, unspecified: Secondary | ICD-10-CM

## 2015-04-11 DIAGNOSIS — E785 Hyperlipidemia, unspecified: Secondary | ICD-10-CM

## 2015-04-11 DIAGNOSIS — D62 Acute posthemorrhagic anemia: Secondary | ICD-10-CM | POA: Diagnosis not present

## 2015-04-11 DIAGNOSIS — K219 Gastro-esophageal reflux disease without esophagitis: Secondary | ICD-10-CM

## 2015-04-11 DIAGNOSIS — E119 Type 2 diabetes mellitus without complications: Secondary | ICD-10-CM

## 2015-04-11 DIAGNOSIS — I10 Essential (primary) hypertension: Secondary | ICD-10-CM

## 2015-04-11 DIAGNOSIS — G629 Polyneuropathy, unspecified: Secondary | ICD-10-CM

## 2015-04-11 DIAGNOSIS — D72829 Elevated white blood cell count, unspecified: Secondary | ICD-10-CM | POA: Diagnosis not present

## 2015-04-11 DIAGNOSIS — K5900 Constipation, unspecified: Secondary | ICD-10-CM | POA: Diagnosis not present

## 2015-04-11 DIAGNOSIS — F329 Major depressive disorder, single episode, unspecified: Secondary | ICD-10-CM | POA: Diagnosis not present

## 2015-04-11 NOTE — Progress Notes (Signed)
Patient ID: Linda Washington, female   DOB: Jan 24, 1951, 64 y.o.   MRN: [id number]   04/11/2015  Facility:  Nursing Home Location:  [place] Room Number: 908-P LEVEL OF CARE:  SNF (31)   Chief Complaint  Patient presents with  . Hospitalization Follow-up    Osteoarthritis S/P Left total knee arthroplasty, hypertension, depression, neuropathy, GERD, hyperlipidemia, overactive bladder, diabetes mellitus type 2, constipation, allergic rhinitis, anemia and leukocytosis    HISTORY OF PRESENT ILLNESS:  This is a 64 year old female was being admitted to [facility] on 04/10/15 from [facility] with osteoarthritis S/P left total knee arthroplasty. She has PMH of tremor or, incontinence, neuropathy, basal cell at [facility], hypothyroidism, hyperlipidemia, anxiety, GERD, diabetes mellitus type 2 and hypertension.  She has been admitted for a short-term rehabilitation.  PAST MEDICAL HISTORY:  Past Medical History  Diagnosis Date  . Tremor   . Incontinence     takes Ditropan daily  . Neuropathy     takes Gabapentin daily  . RLS (restless legs syndrome)   . Basal cell carcinoma   . Hypothyroidism   . Hyperlipemia     takes Simvastatin daily  . Anxiety     takes Citalopram daily  . GERD (gastroesophageal reflux disease)     takes Omeprazole daily  . Hypertension     takes Enalapril daily  . Type 2 diabetes mellitus     takes Metformin and Amaryl daily  . Seasonal allergies     takes Claritin daioy as needed  . PONV (postoperative nausea and vomiting)   . History of Bell's palsy     left side  . History of bronchitis   . History of hiatal hernia   . Arthritis     generalized  . Joint swelling     left knee  . History of kidney stones   . Sleep apnea     pt. states that she does not have a CPAP    CURRENT MEDICATIONS: Reviewed per MAR/see medication list  Allergies  Allergen Reactions  . Hydrocodone     Weird sensation  . Oxycodone    Weird sensation  . Latex Rash     REVIEW OF SYSTEMS:  GENERAL: no change in appetite, no fatigue, no weight changes, no fever, chills or weakness RESPIRATORY: no cough, SOB, DOE, wheezing, hemoptysis CARDIAC: no chest pain, edema or palpitations GI: no abdominal pain, diarrhea, heart burn, nausea or vomiting, + constipation  PHYSICAL EXAMINATION  GENERAL: no acute distress, obese SKIN:  Left knee surgical incision is dry, no redness, covered with dry dressing and ACE wrap EYES: conjunctivae normal, sclerae normal, normal eye lids NECK: supple, trachea midline, no neck masses, no thyroid tenderness, no thyromegaly LYMPHATICS: no LAN in the neck, no supraclavicular LAN RESPIRATORY: breathing is even & unlabored, BS CTAB CARDIAC: RRR, no murmur,no extra heart sounds, no edema GI: abdomen soft, normal BS, no masses, no tenderness, no hepatomegaly, no splenomegaly EXTREMITIES: Able to move 4 extremities PSYCHIATRIC: the patient is alert & oriented to person, affect & behavior appropriate  LABS/RADIOLOGY: Labs reviewed: Basic Metabolic Panel:  Recent Labs  03/28/15 1404 04/09/15 0705  NA 138 135  K 4.3 4.7  CL 104 101  CO2 23 27  GLUCOSE 87 175*  BUN 19 12  CREATININE 0.90 0.72  CALCIUM 9.3 8.6*   CBC:  Recent Labs  03/28/15 1404 04/09/15 0705 04/10/15 0600  WBC 10.4 12.5* 12.5*  NEUTROABS 6.6  --   --  HGB 14.2 11.1* 10.3*  HCT 43.8 34.9* 32.6*  MCV 91.1 92.1 91.3  PLT 423* 306 278   CBG:  Recent Labs  04/09/15 2141 04/10/15 0700 04/10/15 1201  GLUCAP 165* 132* 119*    Dg Chest 2 View  03/28/2015   CLINICAL DATA:  Preop for left total knee arthroplasty.  EXAM: CHEST  2 VIEW  COMPARISON:  None.  FINDINGS: Normal mediastinum and cardiac silhouette. Normal pulmonary vasculature. No evidence of effusion, infiltrate, or pneumothorax. No acute bony abnormality. Degenerative osteophytosis of the thoracic spine.  IMPRESSION: No acute cardiopulmonary process.    Electronically Signed   By: Suzy Bouchard M.D.   On: 03/28/2015 15:54    ASSESSMENT/PLAN:  Osteoarthritis S/P left total knee arthroplasty - for rehabilitation; follow-up with Dr. Mayer Camel, orthopedic surgeon, in 2 weeks; continue Robaxin 500 mg 1 tab by mouth twice a day for muscle spasm; Xarelto 10 mg 1 tab by mouth daily for DVT prophylaxis; and Dilaudid 4 mg 1 tab by mouth every 3 hours when necessary and tramadol 50 mg 1 tab by mouth every 6 hours when necessary for pain  Hypertension - well controlled; continue Vasotec 10 mg by mouth daily at bedtime  Depression - mood is stable; continue Celexa 20 mg 1 tab by mouth daily  Neuropathy - continue gabapentin 400 mg by mouth 3 times a day  GERD - continue omeprazole 40 mg by mouth daily  Hyperlipidemia - continue simvastatin 20 mg 1 tab by mouth daily  Overactive bladder - continue oxybutynin 24 hour 10 mg 1 tab by mouth daily  Diabetes mellitus type 2 - hemoglobin A1c 6.4; continue Amaryl 1 mg by mouth daily and metformin 1000 mg by mouth twice a day  Constipation - discontinue Colace and MiraLAX when necessary; start senna S2 tabs by mouth twice a day and MiraLAX 17 g +4-6 ounces liquid by mouth twice a day  Allergic rhinitis - continue loratadine 10 mg 1 tab by mouth daily when necessary  Anemia, acute blood loss - hemoglobin 10.3; will monitor  Leukocytosis - wbc 12.5; will monitor   Goals of care:  Short-term rehabilitation   Labs/test ordered:  CBC, CMP and TSH   Spent 50 minutes in patient care.     [facility], NP [employer] [phone]

## 2015-04-12 ENCOUNTER — Encounter: Payer: Self-pay | Admitting: Internal Medicine

## 2015-04-12 ENCOUNTER — Non-Acute Institutional Stay (SKILLED_NURSING_FACILITY): Payer: PPO | Admitting: Internal Medicine

## 2015-04-12 DIAGNOSIS — F322 Major depressive disorder, single episode, severe without psychotic features: Secondary | ICD-10-CM | POA: Diagnosis not present

## 2015-04-12 DIAGNOSIS — K5900 Constipation, unspecified: Secondary | ICD-10-CM

## 2015-04-12 DIAGNOSIS — M1712 Unilateral primary osteoarthritis, left knee: Secondary | ICD-10-CM

## 2015-04-12 DIAGNOSIS — E1142 Type 2 diabetes mellitus with diabetic polyneuropathy: Secondary | ICD-10-CM | POA: Diagnosis not present

## 2015-04-12 DIAGNOSIS — I10 Essential (primary) hypertension: Secondary | ICD-10-CM

## 2015-04-12 DIAGNOSIS — D62 Acute posthemorrhagic anemia: Secondary | ICD-10-CM | POA: Diagnosis not present

## 2015-04-12 DIAGNOSIS — D72829 Elevated white blood cell count, unspecified: Secondary | ICD-10-CM

## 2015-04-12 DIAGNOSIS — K219 Gastro-esophageal reflux disease without esophagitis: Secondary | ICD-10-CM | POA: Diagnosis not present

## 2015-04-12 DIAGNOSIS — N3281 Overactive bladder: Secondary | ICD-10-CM

## 2015-04-12 DIAGNOSIS — F329 Major depressive disorder, single episode, unspecified: Secondary | ICD-10-CM

## 2015-04-12 NOTE — Progress Notes (Signed)
Patient ID: Linda Washington, female   DOB: 15-Jul-1951, 65 y.o.   MRN: [id number]     [facility]   PCP: Marda Stalker, PA-C  Code Status: full code  Allergies  Allergen Reactions  . Hydrocodone     Weird sensation  . Oxycodone     Weird sensation  . Latex Rash    Chief Complaint  Patient presents with  . New Admit To SNF     HPI:  64 year old patient is here for short term rehabilitation post hospital admission from 04/08/15-04/10/15 with primary OA of left knee. She underwent left total knee arthroplasty. She is seen in her room today. She denies any concerns. No concerns from staff. She has PMH of hypothyroidism, HLD, RLS, neuropathy, type 2 dm, gerd among others.  Review of Systems:  Constitutional: Negative for fever, chills, diaphoresis.  HENT: Negative for headache, congestion, nasal discharge Eyes: Negative for eye pain, blurred vision, double vision and discharge.  Respiratory: Negative for cough, shortness of breath and wheezing.   Cardiovascular: Negative for chest pain, palpitations Gastrointestinal: Negative for heartburn, nausea, vomiting, abdominal pain. Had bowel movement this am Genitourinary: Negative for dysuria.  Musculoskeletal: Negative for back pain, falls Skin: Negative for itching, rash.  Neurological: Negative for dizziness, tingling, focal weakness Psychiatric/Behavioral: Negative for depression   Past Medical History  Diagnosis Date  . Tremor   . Incontinence     takes Ditropan daily  . Neuropathy     takes Gabapentin daily  . RLS (restless legs syndrome)   . Basal cell carcinoma   . Hypothyroidism   . Hyperlipemia     takes Simvastatin daily  . Anxiety     takes Citalopram daily  . GERD (gastroesophageal reflux disease)     takes Omeprazole daily  . Hypertension     takes Enalapril daily  . Type 2 diabetes mellitus     takes Metformin and Amaryl daily  . Seasonal allergies     takes Claritin  daioy as needed  . PONV (postoperative nausea and vomiting)   . History of Bell's palsy     left side  . History of bronchitis   . History of hiatal hernia   . Arthritis     generalized  . Joint swelling     left knee  . History of kidney stones   . Sleep apnea     pt. states that she does not have a CPAP   Past Surgical History  Procedure Laterality Date  . Basal cell carcinoma excision      Nose  . Carpal tunnel release Bilateral   . Breast lumpectomy    . Knee arthroscopy      Right  . Total abdominal hysterectomy    . Cardiac catheterization      pt. states approximately 15 years ago  . Eye surgery Bilateral     cataract surgery  . Appendectomy    . Dilation and curettage of uterus    . Achilles tendon repair Right   . Lithotripsy  2013  . Total knee arthroplasty Left 04/08/2015    Procedure: LEFT TOTAL KNEE ARTHROPLASTY;  Surgeon: Frederik Pear, MD;  Location: [place];  Service: Orthopedics;  Laterality: Left;   Social History:   reports that she has never smoked. She does not have any smokeless tobacco history on file. She reports that she drinks alcohol. She reports that she does not use illicit drugs.  Family History  Problem Relation Age of Onset  . Heart disease Mother   . Heart disease Father     Medications: Patient's Medications  New Prescriptions   No medications on file  Previous Medications   CITALOPRAM (CELEXA) 20 MG TABLET    Take 20 mg by mouth daily.   ENALAPRIL (VASOTEC) 10 MG TABLET    Take 10 mg by mouth at bedtime.    GABAPENTIN (NEURONTIN) 400 MG CAPSULE    Take 1-2 capsules (400-800 mg total) by mouth 3 (three) times daily.   GLIMEPIRIDE (AMARYL) 1 MG TABLET    Take 1 mg by mouth daily with breakfast.   HYDROMORPHONE (DILAUDID) 4 MG TABLET    Take 1 tablet (4 mg total) by mouth every 3 (three) hours as needed for severe pain.   LORATADINE (CLARITIN) 10 MG TABLET    Take 10 mg by mouth daily as needed for allergies or rhinitis.    METFORMIN  (GLUCOPHAGE) 1000 MG TABLET    Take 1,000 mg by mouth 2 (two) times daily with a meal.   METHOCARBAMOL (ROBAXIN) 500 MG TABLET    Take 1 tablet (500 mg total) by mouth 2 (two) times daily with a meal.   OMEPRAZOLE (PRILOSEC) 40 MG CAPSULE    Take 40 mg by mouth daily.   OXYBUTYNIN (DITROPAN-XL) 10 MG 24 HR TABLET    Take 10 mg by mouth daily.    RIVAROXABAN (XARELTO) 10 MG TABS TABLET    Take 1 tablet (10 mg total) by mouth daily with breakfast.   SIMVASTATIN (ZOCOR) 20 MG TABLET    Take 20 mg by mouth daily at 6 PM.   TETRAHYDROZOLINE-PEG (EYE MOISTURIZING RELIEF OP)    Apply 1-2 drops to eye daily as needed (eye pain, dry eyes).   TRAMADOL (ULTRAM) 50 MG TABLET    Take 50 mg by mouth every 6 (six) hours as needed for moderate pain or severe pain.   VITAMIN D, ERGOCALCIFEROL, (DRISDOL) 50000 UNITS CAPS CAPSULE    Take 50,000 Units by mouth every 7 (seven) days. Takes on Mondays  Modified Medications   No medications on file  Discontinued Medications   No medications on file     Physical Exam: Filed Vitals:   04/12/15 1316  BP: 137/89  Pulse: 86  Temp: 98.3 F (36.8 C)  Resp: 19  Height: 5\' 8"  (1.727 m)  Weight: 265 lb 12.8 oz (120.566 kg)  SpO2: 97%   Body mass index is 40.42 kg/(m^2).   General- elderly female, obese, in no acute distress Head- normocephalic, atraumatic Throat- moist mucus membrane, normal oropharynx Eyes- PERRLA, EOMI, no pallor, no icterus, no discharge, normal conjunctiva, normal sclera Neck- no cervical lymphadenopathy, no jugular vein distension, no carotid bruit Cardiovascular- normal s1,s2, no murmurs, palpable dorsalis pedis, trace left leg edema Respiratory- bilateral clear to auscultation, no wheeze, no rhonchi, no crackles, no use of accessory muscles Abdomen- bowel sounds present, soft, non tender Musculoskeletal- able to move all 4 extremities, left knee limited range of motion  Neurological- no focal deficit Skin- warm and dry, left knee  surgical dressing clean and dry, internal sutures with mild erythema around the incision area, no drainage Psychiatry- alert and oriented to person, place and time, normal mood and affect    Labs reviewed: Basic Metabolic Panel:  Recent Labs  03/28/15 1404 04/09/15 0705  NA 138 135  K 4.3 4.7  CL 104 101  CO2 23 27  GLUCOSE 87 175*  BUN 19 12  CREATININE 0.90 0.72  CALCIUM 9.3 8.6*   CBC:  Recent Labs  03/28/15 1404 04/09/15 0705 04/10/15 0600  WBC 10.4 12.5* 12.5*  NEUTROABS 6.6  --   --   HGB 14.2 11.1* 10.3*  HCT 43.8 34.9* 32.6*  MCV 91.1 92.1 91.3  PLT 423* 306 278   CBG:  Recent Labs  04/09/15 2141 04/10/15 0700 04/10/15 1201  GLUCAP 165* 132* 119*     Assessment/Plan  Left knee Osteoarthritis  S/P left total knee arthroplasty. Will have her work with physical therapy and occupational therapy team to help with gait training and muscle strengthening exercises.fall precautions. Skin care. Encourage to be out of bed. Has follow up with Dr. Mayer Camel in 2 weeks. Continue dilaudid 4 mg every 3 h as needed for pain with tramadol 50 mg q6h prn. Continue robaxin 500 mg bid for muscle spasm and xarelto 10 mg daily for DVT prophylaxis.  Blood loss anemia Post op, acute, monitor h&h  Leukocytosis Incision site healing well, no signs of infection clinically. Afebrile. Monitor cbc  Hypertension  Stable. continue Vasotec 10 mg daily  Dm type 2 with neuropathy a1c 6.4. Monitor cbg. continue Amaryl 1 mg daily and metformin 1000 mg bid. Continue gabapentin 400 mg tid, denies worsening of neurological symptom. Continue simvastatin and enalapril  Depression  continue Celexa 20 mg daily. Mood stable  GERD Stable symptom. continue omeprazole 40 mg daily  Overactive bladder Stable, continue oxybutynin 10 mg daily  Constipation Continue senna S 2 tabs bid and miralax, hydration encouraged   Goals of care: short term rehabilitation   Labs/tests ordered: cbc,  bmp  Family/ staff Communication: reviewed care plan with patient and nursing supervisor    Blanchie Serve, MD  [facility] [phone] (Monday-Friday 8 am - 5 pm) [phone] (afterhours)

## 2015-04-17 ENCOUNTER — Non-Acute Institutional Stay: Payer: PPO | Admitting: Adult Health

## 2015-04-17 ENCOUNTER — Encounter: Payer: Self-pay | Admitting: Adult Health

## 2015-04-17 DIAGNOSIS — J309 Allergic rhinitis, unspecified: Secondary | ICD-10-CM

## 2015-04-17 DIAGNOSIS — E119 Type 2 diabetes mellitus without complications: Secondary | ICD-10-CM | POA: Diagnosis not present

## 2015-04-17 DIAGNOSIS — D62 Acute posthemorrhagic anemia: Secondary | ICD-10-CM

## 2015-04-17 DIAGNOSIS — E785 Hyperlipidemia, unspecified: Secondary | ICD-10-CM | POA: Diagnosis not present

## 2015-04-17 DIAGNOSIS — M1712 Unilateral primary osteoarthritis, left knee: Secondary | ICD-10-CM

## 2015-04-17 DIAGNOSIS — K219 Gastro-esophageal reflux disease without esophagitis: Secondary | ICD-10-CM

## 2015-04-17 DIAGNOSIS — I10 Essential (primary) hypertension: Secondary | ICD-10-CM | POA: Diagnosis not present

## 2015-04-17 DIAGNOSIS — G629 Polyneuropathy, unspecified: Secondary | ICD-10-CM

## 2015-04-17 DIAGNOSIS — F32A Depression, unspecified: Secondary | ICD-10-CM

## 2015-04-17 DIAGNOSIS — N3281 Overactive bladder: Secondary | ICD-10-CM | POA: Diagnosis not present

## 2015-04-17 DIAGNOSIS — K5900 Constipation, unspecified: Secondary | ICD-10-CM | POA: Diagnosis not present

## 2015-04-17 DIAGNOSIS — D72829 Elevated white blood cell count, unspecified: Secondary | ICD-10-CM | POA: Diagnosis not present

## 2015-04-17 DIAGNOSIS — F329 Major depressive disorder, single episode, unspecified: Secondary | ICD-10-CM

## 2015-04-17 NOTE — Progress Notes (Signed)
Patient ID: Linda Washington, female   DOB: Aug 24, 1951, 64 y.o.   MRN: [id number]   04/17/2015  Facility:  Nursing Home Location:  [place] Room Number: 908-P LEVEL OF CARE:  SNF (31)   Chief Complaint  Patient presents with  . Discharge Note    Osteoarthritis S/P Left total knee arthroplasty, hypertension, depression, neuropathy, GERD, hyperlipidemia, overactive bladder, diabetes mellitus type 2, constipation, allergic rhinitis, anemia and leukocytosis    HISTORY OF PRESENT ILLNESS:  This is a 64 year old female who is for discharge home and will have outpatient rehabilitation. DME:  Bedside commode. She has been admitted to [facility] on 04/10/15 from [facility] with osteoarthritis S/P left total knee arthroplasty. She has PMH of tremor or, incontinence, neuropathy, basal cell at [facility], hypothyroidism, hyperlipidemia, anxiety, GERD, diabetes mellitus type 2 and hypertension.  Patient was admitted to this facility for short-term rehabilitation after the patient's recent hospitalization.  Patient has completed SNF rehabilitation and therapy has cleared the patient for discharge.   PAST MEDICAL HISTORY:  Past Medical History  Diagnosis Date  . Tremor   . Incontinence     takes Ditropan daily  . Neuropathy     takes Gabapentin daily  . RLS (restless legs syndrome)   . Basal cell carcinoma   . Hypothyroidism   . Hyperlipemia     takes Simvastatin daily  . Anxiety     takes Citalopram daily  . GERD (gastroesophageal reflux disease)     takes Omeprazole daily  . Hypertension     takes Enalapril daily  . Type 2 diabetes mellitus     takes Metformin and Amaryl daily  . Seasonal allergies     takes Claritin daioy as needed  . PONV (postoperative nausea and vomiting)   . History of Bell's palsy     left side  . History of bronchitis   . History of hiatal hernia   . Arthritis     generalized  . Joint swelling     left knee  . History of  kidney stones   . Sleep apnea     pt. states that she does not have a CPAP    CURRENT MEDICATIONS: Reviewed per MAR/see medication list  Allergies  Allergen Reactions  . Hydrocodone     Weird sensation  . Oxycodone     Weird sensation  . Latex Rash     REVIEW OF SYSTEMS:  GENERAL: no change in appetite, no fatigue, no weight changes, no fever, chills or weakness RESPIRATORY: no cough, SOB, DOE, wheezing, hemoptysis CARDIAC: no chest pain, edema or palpitations GI: no abdominal pain, diarrhea, heart burn, nausea or vomiting  PHYSICAL EXAMINATION  GENERAL: no acute distress, obese SKIN:  Left knee surgical incision is dry, no redness, covered with dry dressing  NECK: supple, trachea midline, no neck masses, no thyroid tenderness, no thyromegaly LYMPHATICS: no LAN in the neck, no supraclavicular LAN RESPIRATORY: breathing is even & unlabored, BS CTAB CARDIAC: RRR, no murmur,no extra heart sounds, no edema GI: abdomen soft, normal BS, no masses, no tenderness, no hepatomegaly, no splenomegaly EXTREMITIES: Able to move 4 extremities PSYCHIATRIC: the patient is alert & oriented to person, affect & behavior appropriate  LABS/RADIOLOGY: Labs reviewed: Basic Metabolic Panel:  Recent Labs  03/28/15 1404 04/09/15 0705  NA 138 135  K 4.3 4.7  CL 104 101  CO2 23 27  GLUCOSE 87 175*  BUN 19 12  CREATININE 0.90 0.72  CALCIUM 9.3 8.6*   CBC:  Recent Labs  03/28/15 1404 04/09/15 0705 04/10/15 0600  WBC 10.4 12.5* 12.5*  NEUTROABS 6.6  --   --   HGB 14.2 11.1* 10.3*  HCT 43.8 34.9* 32.6*  MCV 91.1 92.1 91.3  PLT 423* 306 278   CBG:  Recent Labs  04/09/15 2141 04/10/15 0700 04/10/15 1201  GLUCAP 165* 132* 119*    Dg Chest 2 View  03/28/2015   CLINICAL DATA:  Preop for left total knee arthroplasty.  EXAM: CHEST  2 VIEW  COMPARISON:  None.  FINDINGS: Normal mediastinum and cardiac silhouette. Normal pulmonary vasculature. No evidence of effusion, infiltrate,  or pneumothorax. No acute bony abnormality. Degenerative osteophytosis of the thoracic spine.  IMPRESSION: No acute cardiopulmonary process.   Electronically Signed   By: Suzy Bouchard M.D.   On: 03/28/2015 15:54    ASSESSMENT/PLAN:  Osteoarthritis S/P left total knee arthroplasty - for outpatient rehabilitation; follow-up with Dr. Mayer Camel, orthopedic surgeon; continue Robaxin 500 mg 1 tab by mouth twice a day for muscle spasm; Xarelto 10 mg 1 tab by mouth daily for DVT prophylaxis; and Dilaudid 4 mg 1 tab by mouth every 3 hours when necessary and tramadol 50 mg 1 tab by mouth every 6 hours when necessary for pain  Hypertension - well controlled; continue Vasotec 10 mg by mouth daily at bedtime  Depression - mood is stable; continue Celexa 20 mg 1 tab by mouth daily  Neuropathy - continue gabapentin 400 mg by mouth 3 times a day  GERD - continue omeprazole 40 mg by mouth daily  Hyperlipidemia - continue simvastatin 20 mg 1 tab by mouth daily  Overactive bladder - continue oxybutynin 24 hour 10 mg 1 tab by mouth daily  Diabetes mellitus type 2 - hemoglobin A1c 6.4; continue Amaryl 1 mg by mouth daily and metformin 1000 mg by mouth twice a day  Constipation - continue senna S2 tabs by mouth twice a day and MiraLAX 17 g +4-6 ounces liquid by mouth twice a day  Allergic rhinitis - continue loratadine 10 mg 1 tab by mouth daily when necessary  Anemia, acute blood loss - hemoglobin 10.3; will monitor  Leukocytosis - wbc 12.5; follow-up with PCP; awaiting results of labs done    I have filled out patient's discharge paperwork and written prescriptions.  Patient will have outpatient rehabilitation.  DME provided:  Bedside commode  Total discharge time: Greater than 30 minutes  Discharge time involved coordination of the discharge process with social worker, nursing staff and therapy department. Medical justification for DME verified.    [facility], NP [employer] [phone]

## 2015-04-28 ENCOUNTER — Emergency Department (HOSPITAL_COMMUNITY)
Admission: EM | Admit: 2015-04-28 | Discharge: 2015-04-28 | Disposition: A | Discharge status: Home / Self Care | Payer: PPO | Attending: Emergency Medicine | Admitting: Emergency Medicine

## 2015-04-28 ENCOUNTER — Encounter (HOSPITAL_COMMUNITY): Payer: Self-pay | Admitting: Emergency Medicine

## 2015-04-28 DIAGNOSIS — E039 Hypothyroidism, unspecified: Secondary | ICD-10-CM | POA: Diagnosis not present

## 2015-04-28 DIAGNOSIS — R599 Enlarged lymph nodes, unspecified: Secondary | ICD-10-CM

## 2015-04-28 DIAGNOSIS — I10 Essential (primary) hypertension: Secondary | ICD-10-CM | POA: Diagnosis not present

## 2015-04-28 DIAGNOSIS — Z91040 Latex allergy status: Secondary | ICD-10-CM | POA: Diagnosis not present

## 2015-04-28 DIAGNOSIS — E785 Hyperlipidemia, unspecified: Secondary | ICD-10-CM | POA: Insufficient documentation

## 2015-04-28 DIAGNOSIS — M1990 Unspecified osteoarthritis, unspecified site: Secondary | ICD-10-CM | POA: Diagnosis not present

## 2015-04-28 DIAGNOSIS — F419 Anxiety disorder, unspecified: Secondary | ICD-10-CM | POA: Insufficient documentation

## 2015-04-28 DIAGNOSIS — R590 Localized enlarged lymph nodes: Secondary | ICD-10-CM | POA: Insufficient documentation

## 2015-04-28 DIAGNOSIS — Z9889 Other specified postprocedural states: Secondary | ICD-10-CM | POA: Diagnosis not present

## 2015-04-28 DIAGNOSIS — Z7901 Long term (current) use of anticoagulants: Secondary | ICD-10-CM | POA: Insufficient documentation

## 2015-04-28 DIAGNOSIS — Z87442 Personal history of urinary calculi: Secondary | ICD-10-CM | POA: Insufficient documentation

## 2015-04-28 DIAGNOSIS — G629 Polyneuropathy, unspecified: Secondary | ICD-10-CM | POA: Insufficient documentation

## 2015-04-28 DIAGNOSIS — Z8719 Personal history of other diseases of the digestive system: Secondary | ICD-10-CM | POA: Insufficient documentation

## 2015-04-28 DIAGNOSIS — E119 Type 2 diabetes mellitus without complications: Secondary | ICD-10-CM | POA: Insufficient documentation

## 2015-04-28 DIAGNOSIS — Z8709 Personal history of other diseases of the respiratory system: Secondary | ICD-10-CM | POA: Insufficient documentation

## 2015-04-28 DIAGNOSIS — Z79899 Other long term (current) drug therapy: Secondary | ICD-10-CM | POA: Diagnosis not present

## 2015-04-28 DIAGNOSIS — E669 Obesity, unspecified: Secondary | ICD-10-CM | POA: Diagnosis not present

## 2015-04-28 DIAGNOSIS — Z85828 Personal history of other malignant neoplasm of skin: Secondary | ICD-10-CM | POA: Insufficient documentation

## 2015-04-28 DIAGNOSIS — Z96652 Presence of left artificial knee joint: Secondary | ICD-10-CM | POA: Diagnosis not present

## 2015-04-28 DIAGNOSIS — R2241 Localized swelling, mass and lump, right lower limb: Secondary | ICD-10-CM | POA: Diagnosis present

## 2015-04-28 NOTE — Discharge Instructions (Signed)
Your evaluated for the spot on her leg. It appears to be a swollen lymph node. There is no evidence of infection or DVT/blood clot. It is important for you to follow-up with your PCP so they may watch the lymph node area return to ED for worsening symptoms.  Swollen Lymph Nodes The lymphatic system filters fluid from around cells. It is like a system of blood vessels. These channels carry lymph instead of blood. The lymphatic system is an important part of the immune (disease fighting) system. When people talk about "swollen glands in the neck," they are usually talking about swollen lymph nodes. The lymph nodes are like the little traps for infection. You and your caregiver may be able to feel lymph nodes, especially swollen nodes, in these common areas: the groin (inguinal area), armpits (axilla), and above the clavicle (supraclavicular). You may also feel them in the neck (cervical) and the back of the head just above the hairline (occipital). Swollen glands occur when there is any condition in which the body responds with an allergic type of reaction. For instance, the glands in the neck can become swollen from insect bites or any type of minor infection on the head. These are very noticeable in children with only minor problems. Lymph nodes may also become swollen when there is a tumor or problem with the lymphatic system, such as Hodgkin's disease. TREATMENT   Most swollen glands do not require treatment. They can be observed (watched) for a short period of time, if your caregiver feels it is necessary. Most of the time, observation is not necessary.  Antibiotics (medicines that kill germs) may be prescribed by your caregiver. Your caregiver may prescribe these if he or she feels the swollen glands are due to a bacterial (germ) infection. Antibiotics are not used if the swollen glands are caused by a virus. HOME CARE INSTRUCTIONS   Take medications as directed by your caregiver. Only take  over-the-counter or prescription medicines for pain, discomfort, or fever as directed by your caregiver. SEEK MEDICAL CARE IF:   If you begin to run a temperature greater than 102 F (38.9 C), or as your caregiver suggests. MAKE SURE YOU:   Understand these instructions.  Will watch your condition.  Will get help right away if you are not doing well or get worse. Document Released: 09/25/2002 Document Revised: 12/28/2011 Document Reviewed: 10/05/2005 [facility] Patient Information 2015 [place], [place]. This information is not intended to replace advice given to you by your health care provider. Make sure you discuss any questions you have with your health care provider.

## 2015-04-28 NOTE — ED Notes (Signed)
Pt alert and oriented x4. Respirations even and unlabored, bilateral symmetrical rise and fall of chest. Skin warm and dry. In no acute distress. Denies needs.   

## 2015-04-28 NOTE — ED Notes (Signed)
Pt c/o knot to left knee noted today, pt states she had left total knee replacement on April 08, 2015. Pt states pain and swelling to knee have been ongoing since surgery. Pt states she has been without xarelto for four days, which she takes for atrial flutter, due to forgetting to get a refill.

## 2015-04-28 NOTE — ED Provider Notes (Signed)
CSN: [id number]     Arrival date & time 04/28/15  1301 History   First MD Initiated Contact with Patient 04/28/15 1427     No chief complaint on file.    (Consider location/radiation/quality/duration/timing/severity/associated sxs/prior Treatment) HPI Linda Washington is a 64 y.o. female with a history of total knee replacement on 6/20 in for evaluation of not on the back of her leg. Patient states after exercising today she noticed a large bump that appeared suddenly on the inside of her left thigh/groin. She reports it is only mildly tender at 1/10 with palpation. She denies any redness, swelling, difficulties breathing, cough, chest pain, leg swelling. She does report forgetting to take her Xarelto 7/5-7/8, but did take it today and yesterday. She has not tried anything to improve her symptoms. Nothing seems to make it better or worse. No other aggravating or modifying factors.  Past Medical History  Diagnosis Date  . Tremor   . Incontinence     takes Ditropan daily  . Neuropathy     takes Gabapentin daily  . RLS (restless legs syndrome)   . Basal cell carcinoma   . Hypothyroidism   . Hyperlipemia     takes Simvastatin daily  . Anxiety     takes Citalopram daily  . GERD (gastroesophageal reflux disease)     takes Omeprazole daily  . Hypertension     takes Enalapril daily  . Type 2 diabetes mellitus     takes Metformin and Amaryl daily  . Seasonal allergies     takes Claritin daioy as needed  . PONV (postoperative nausea and vomiting)   . History of Bell's palsy     left side  . History of bronchitis   . History of hiatal hernia   . Arthritis     generalized  . Joint swelling     left knee  . History of kidney stones   . Sleep apnea     pt. states that she does not have a CPAP   Past Surgical History  Procedure Laterality Date  . Basal cell carcinoma excision      Nose  . Carpal tunnel release Bilateral   . Breast lumpectomy    . Knee arthroscopy      Right  . Total  abdominal hysterectomy    . Cardiac catheterization      pt. states approximately 15 years ago  . Eye surgery Bilateral     cataract surgery  . Appendectomy    . Dilation and curettage of uterus    . Achilles tendon repair Right   . Lithotripsy  2013  . Total knee arthroplasty Left 04/08/2015    Procedure: LEFT TOTAL KNEE ARTHROPLASTY;  Surgeon: Frederik Pear, MD;  Location: [facility];  Service: Orthopedics;  Laterality: Left;   Family History  Problem Relation Age of Onset  . Heart disease Mother   . Heart disease Father    History  Substance Use Topics  . Smoking status: Never Smoker   . Smokeless tobacco: Not on file  . Alcohol Use: 0.0 oz/week    0 Standard drinks or equivalent per week     Comment: Rare-wine   OB History    No data available     Review of Systems A 10 point review of systems was completed and was negative except for pertinent positives and negatives as mentioned in the history of present illness     Allergies  Hydrocodone; Methocarbamol; Oxycodone; and Latex  Home Medications  Prior to Admission medications   Medication Sig Start Date End Date Taking? Authorizing Provider  citalopram (CELEXA) 20 MG tablet Take 20 mg by mouth daily.   Yes Historical Provider, MD  diphenhydrAMINE (SOMINEX) 25 MG tablet Take 25 mg by mouth every 4 (four) hours as needed (hives).   Yes Historical Provider, MD  enalapril (VASOTEC) 10 MG tablet Take 10 mg by mouth at bedtime.    Yes Historical Provider, MD  gabapentin (NEURONTIN) 400 MG capsule Take 1-2 capsules (400-800 mg total) by mouth 3 (three) times daily. Patient taking differently: Take 400-800 mg by mouth 3 (three) times daily. Take 400 mg in the morning and afternoon and take 800 mg in the evening 01/16/15  Yes Vikram R Penumalli, MD  glimepiride (AMARYL) 1 MG tablet Take 1 mg by mouth daily with breakfast.   Yes Historical Provider, MD  HYDROmorphone (DILAUDID) 4 MG tablet Take 1 tablet (4 mg total) by mouth every 3  (three) hours as needed for severe pain. 04/10/15  Yes Leighton Parody, PA-C  loratadine (CLARITIN) 10 MG tablet Take 10 mg by mouth daily as needed for allergies or rhinitis.    Yes Historical Provider, MD  metFORMIN (GLUCOPHAGE) 1000 MG tablet Take 1,000 mg by mouth 2 (two) times daily with a meal.   Yes Historical Provider, MD  omeprazole (PRILOSEC) 40 MG capsule Take 40 mg by mouth daily.   Yes Historical Provider, MD  oxybutynin (DITROPAN-XL) 10 MG 24 hr tablet Take 10 mg by mouth daily.    Yes Historical Provider, MD  rivaroxaban (XARELTO) 10 MG TABS tablet Take 1 tablet (10 mg total) by mouth daily with breakfast. 04/10/15  Yes Leighton Parody, PA-C  simvastatin (ZOCOR) 20 MG tablet Take 20 mg by mouth daily at 6 PM.   Yes Historical Provider, MD  Tetrahydrozoline-PEG (EYE MOISTURIZING RELIEF OP) Apply 1-2 drops to eye daily as needed (eye pain, dry eyes).   Yes Historical Provider, MD  tiZANidine (ZANAFLEX) 4 MG tablet Take 4 mg by mouth 2 (two) times daily as needed for muscle spasms.   Yes Historical Provider, MD  traMADol (ULTRAM) 50 MG tablet Take 50 mg by mouth every 6 (six) hours as needed for moderate pain or severe pain.   Yes Historical Provider, MD  Vitamin D, Ergocalciferol, (DRISDOL) 50000 UNITS CAPS capsule Take 50,000 Units by mouth every 7 (seven) days. Takes on Wednesday.   Yes Historical Provider, MD  methocarbamol (ROBAXIN) 500 MG tablet Take 1 tablet (500 mg total) by mouth 2 (two) times daily with a meal. Patient not taking: Reported on 04/28/2015 04/10/15   Leighton Parody, PA-C   BP 101/62 mmHg  Pulse 60  Temp(Src) 97.7 F (36.5 C) (Oral)  Resp 17  SpO2 93% Physical Exam  Constitutional: She is oriented to person, place, and time. She appears well-developed and well-nourished.  Obese  HENT:  Head: Normocephalic and atraumatic.  Mouth/Throat: Oropharynx is clear and moist.  Eyes: Conjunctivae are normal. Pupils are equal, round, and reactive to light. Right eye  exhibits no discharge. Left eye exhibits no discharge. No scleral icterus.  Neck: Neck supple.  Cardiovascular: Normal rate, regular rhythm and normal heart sounds.   Pulmonary/Chest: Effort normal and breath sounds normal. No respiratory distress. She has no wheezes. She has no rales.  Abdominal: Soft. There is no tenderness.  Musculoskeletal: She exhibits no tenderness.  Small, 1.5 cm, round, firm cutaneous nodule noted to medial aspect of left groin/proximal thigh. No erythema  or tenderness. Surgical knee appears to be healing well. No erythema or drainage, swelling.  Neurological: She is alert and oriented to person, place, and time.  Cranial Nerves II-XII grossly intact  Skin: Skin is warm and dry. No rash noted.  Psychiatric: She has a normal mood and affect.  Nursing note and vitals reviewed.   ED Course  Procedures (including critical care time) Labs Review EMERGENCY DEPARTMENT US SOFT TISSUE INTERPRETATION "Study: Limited Ultrasound of the noted body part in comments below"  INDICATIONS: Other (refer to comments) Multiple views of the body part are obtained with a multi-frequency linear probe  PERFORMED BY:  Myself  IMAGES ARCHIVED?: Yes  SIDE:Left  BODY PART:Lower extremity  FINDINGS: No abcess noted  LIMITATIONS:  Body Habitus  INTERPRETATION:  No cellulitis noted  COMMENT:  Image consistent with lymph node versus cyst.   Labs Reviewed - No data to display  Imaging Review No results found.   EKG Interpretation None     Meds given in ED:  Medications - No data to display  New Prescriptions   No medications on file   Filed Vitals:   04/28/15 1318 04/28/15 1447 04/28/15 1500  BP: 133/59 102/50 101/62  Pulse: 68 61 60  Temp: 97.7 F (36.5 C)    TempSrc: Oral    Resp: 18 16 17   SpO2: 98% 91% 93%    MDM  Vitals stable - WNL -afebrile Pt resting comfortably in ED. PE--physical exam is not concerning. Benign lung exam and no respiratory  complaints. Not consistent with DVT or abscess. Area is firm and more consistent with lymph node. No evidence of infection evident.  Plan patient follow-up with PCP in order to further evaluate and follow likely enlarged lymph node on leg. No evidence of other acute or emergent pathology at this time. Doubt infection or vascular compromise. I discussed all relevant lab findings and imaging results with pt and they verbalized understanding. Discussed f/u with PCP within 48 hrs and return precautions, pt very amenable to plan. Prior to patient discharge, I discussed and reviewed this case with Dr.Zammit, who also saw and evaluated the pt.  Final diagnoses:  Enlarged lymph node       Comer Locket, PA-C 04/28/15 1556  Milton Ferguson, MD 04/30/15 [phone]

## 2015-04-28 NOTE — ED Notes (Signed)
see triage note. pt had left knee replacement on June 20th, was off blood thinner last Tues-Thurs 7/5-7/8. took blood thinner Saturday and today. pt showered yesterday and did not notice bump on inside of left thigh, today was checking knee and felt bump on left inside thigh area. Area size of walnut

## 2015-05-06 ENCOUNTER — Other Ambulatory Visit: Payer: Self-pay

## 2015-05-06 ENCOUNTER — Ambulatory Visit (HOSPITAL_COMMUNITY)
Admission: RE | Admit: 2015-05-06 | Discharge: 2015-05-06 | Disposition: A | Discharge status: Home / Self Care | Payer: PPO | Source: Ambulatory Visit | Attending: Nurse Practitioner | Admitting: Nurse Practitioner

## 2015-05-06 ENCOUNTER — Encounter (HOSPITAL_COMMUNITY): Payer: Self-pay | Admitting: Nurse Practitioner

## 2015-05-06 VITALS — BP 120/82 | HR 73 | Ht 68.0 in | Wt 253.4 lb

## 2015-05-06 DIAGNOSIS — K219 Gastro-esophageal reflux disease without esophagitis: Secondary | ICD-10-CM | POA: Diagnosis not present

## 2015-05-06 DIAGNOSIS — Z7902 Long term (current) use of antithrombotics/antiplatelets: Secondary | ICD-10-CM | POA: Insufficient documentation

## 2015-05-06 DIAGNOSIS — G4730 Sleep apnea, unspecified: Secondary | ICD-10-CM | POA: Insufficient documentation

## 2015-05-06 DIAGNOSIS — Z885 Allergy status to narcotic agent status: Secondary | ICD-10-CM | POA: Diagnosis not present

## 2015-05-06 DIAGNOSIS — I4892 Unspecified atrial flutter: Secondary | ICD-10-CM | POA: Insufficient documentation

## 2015-05-06 DIAGNOSIS — E1140 Type 2 diabetes mellitus with diabetic neuropathy, unspecified: Secondary | ICD-10-CM | POA: Insufficient documentation

## 2015-05-06 DIAGNOSIS — E039 Hypothyroidism, unspecified: Secondary | ICD-10-CM | POA: Diagnosis not present

## 2015-05-06 DIAGNOSIS — G2581 Restless legs syndrome: Secondary | ICD-10-CM | POA: Diagnosis not present

## 2015-05-06 DIAGNOSIS — Z8249 Family history of ischemic heart disease and other diseases of the circulatory system: Secondary | ICD-10-CM | POA: Diagnosis not present

## 2015-05-06 DIAGNOSIS — E785 Hyperlipidemia, unspecified: Secondary | ICD-10-CM | POA: Diagnosis not present

## 2015-05-06 DIAGNOSIS — I10 Essential (primary) hypertension: Secondary | ICD-10-CM | POA: Diagnosis not present

## 2015-05-06 DIAGNOSIS — Z85828 Personal history of other malignant neoplasm of skin: Secondary | ICD-10-CM | POA: Diagnosis not present

## 2015-05-06 DIAGNOSIS — E669 Obesity, unspecified: Secondary | ICD-10-CM | POA: Insufficient documentation

## 2015-05-06 DIAGNOSIS — Z79899 Other long term (current) drug therapy: Secondary | ICD-10-CM | POA: Insufficient documentation

## 2015-05-06 NOTE — Patient Instructions (Signed)
[phone] is number for patient assistance program.  If they cannot help with copays then call us [phone] and we will get you set up with the coumadin clinic  Afib clinic as needed only.

## 2015-05-06 NOTE — Progress Notes (Signed)
Patient ID: LYNELL Washington, female   DOB: 08-20-1951, 64 y.o.   MRN: [id number]      Primary Care Physician: Marda Stalker, PA-C Referring Physician: [facility] pre op   Linda Washington is a 64 y.o. female with a h/o  DM , HTN, sleep apnea untreated, obesity that presented to [facility] for preop for pending left knee replacement and was found to be in typical aflutter with variable av block,at 68 bpm. Pt was asymptomatic and referred to afib clinic for further evaluation. She was told 15 years ago that she had mitral valve prolapse and was on BB/antibiotics  for years. She had a cath several years ago and was told she had one small lesion (20%) and the mitral valve was normal. She also has sleep apnea but is not treated. She couldn't tolerate the mask and used nasal prongs at night but her equipment eventually got old and was not replaced.She has not been reevaluated with a new sleep study. Even though she can not exercise due to knee issues, she has been modifying her diet and has lost 40 lbs. She does not use alcohol nor smoke. She has a chads vasc score of 4(female, HTN, DM, age) and has stopped her asa for surgery that is pending next Monday. No recent echo. She was cleared for surgery without any further aflutter seen and did start xarelto after surgery .  Today, she returns feeling well. Is one month our from her knee surgery and not aware of any irregular heart beat. Is taking xarelto without bleeding issues but may want to switch to warfarin in the near future if she cannot afford. She is paying 33$ a month now. Assistance number with Xarelto was given to pt.   She denies symptoms of palpitations, chest pain, shortness of breath, orthopnea, PND, lower extremity edema, dizziness, presyncope, syncope, or neurologic sequela. The patient is tolerating medications without difficulties and is otherwise without complaint today.   Past Medical History  Diagnosis Date  . Tremor   . Incontinence     takes Ditropan  daily  . Neuropathy     takes Gabapentin daily  . RLS (restless legs syndrome)   . Basal cell carcinoma   . Hypothyroidism   . Hyperlipemia     takes Simvastatin daily  . Anxiety     takes Citalopram daily  . GERD (gastroesophageal reflux disease)     takes Omeprazole daily  . Hypertension     takes Enalapril daily  . Type 2 diabetes mellitus     takes Metformin and Amaryl daily  . Seasonal allergies     takes Claritin daioy as needed  . PONV (postoperative nausea and vomiting)   . History of Bell's palsy     left side  . History of bronchitis   . History of hiatal hernia   . Arthritis     generalized  . Joint swelling     left knee  . History of kidney stones   . Sleep apnea     pt. states that she does not have a CPAP   Past Surgical History  Procedure Laterality Date  . Basal cell carcinoma excision      Nose  . Carpal tunnel release Bilateral   . Breast lumpectomy    . Knee arthroscopy      Right  . Total abdominal hysterectomy    . Cardiac catheterization      pt. states approximately 15 years ago  . Eye surgery  Bilateral     cataract surgery  . Appendectomy    . Dilation and curettage of uterus    . Achilles tendon repair Right   . Lithotripsy  2013  . Total knee arthroplasty Left 04/08/2015    Procedure: LEFT TOTAL KNEE ARTHROPLASTY;  Surgeon: Frederik Pear, MD;  Location: [place];  Service: Orthopedics;  Laterality: Left;    Current Outpatient Prescriptions  Medication Sig Dispense Refill  . citalopram (CELEXA) 20 MG tablet Take 20 mg by mouth daily.    . cyclobenzaprine (FLEXERIL) 10 MG tablet Take 10 mg by mouth 3 (three) times daily as needed for muscle spasms.    . diphenhydrAMINE (SOMINEX) 25 MG tablet Take 25 mg by mouth every 4 (four) hours as needed (hives).    . enalapril (VASOTEC) 10 MG tablet Take 10 mg by mouth at bedtime.     . gabapentin (NEURONTIN) 400 MG capsule Take 1-2 capsules (400-800 mg total) by mouth 3 (three) times daily. (Patient  taking differently: Take 400-800 mg by mouth 3 (three) times daily. Take 400 mg in the morning and afternoon and take 800 mg in the evening) 180 capsule 6  . glimepiride (AMARYL) 1 MG tablet Take 1 mg by mouth daily with breakfast.    . HYDROmorphone (DILAUDID) 4 MG tablet Take 1 tablet (4 mg total) by mouth every 3 (three) hours as needed for severe pain. 60 tablet 0  . loratadine (CLARITIN) 10 MG tablet Take 10 mg by mouth daily as needed for allergies or rhinitis.     . metFORMIN (GLUCOPHAGE) 1000 MG tablet Take 1,000 mg by mouth 2 (two) times daily with a meal.    . omeprazole (PRILOSEC) 40 MG capsule Take 40 mg by mouth daily.    Marland Kitchen oxybutynin (DITROPAN-XL) 10 MG 24 hr tablet Take 10 mg by mouth daily.     . rivaroxaban (XARELTO) 10 MG TABS tablet Take 1 tablet (10 mg total) by mouth daily with breakfast. 12 tablet 0  . simvastatin (ZOCOR) 20 MG tablet Take 20 mg by mouth daily at 6 PM.    . Tetrahydrozoline-PEG (EYE MOISTURIZING RELIEF OP) Apply 1-2 drops to eye daily as needed (eye pain, dry eyes).    . traMADol (ULTRAM) 50 MG tablet Take 50 mg by mouth every 6 (six) hours as needed for moderate pain or severe pain.    . Vitamin D, Ergocalciferol, (DRISDOL) 50000 UNITS CAPS capsule Take 50,000 Units by mouth every 7 (seven) days. Takes on Wednesday.     No current facility-administered medications for this encounter.    Allergies  Allergen Reactions  . Hydrocodone     Weird sensation  . Methocarbamol Hives  . Oxycodone     Weird sensation  . Latex Rash    History   Social History  . Marital Status: Divorced    Spouse Name: N/A  . Number of Children: N/A  . Years of Education: Some colle   Occupational History  . Retired    Social History Main Topics  . Smoking status: Never Smoker   . Smokeless tobacco: Not on file  . Alcohol Use: 0.0 oz/week    0 Standard drinks or equivalent per week     Comment: Rare-wine  . Drug Use: No  . Sexual Activity: Not on file   Other  Topics Concern  . Not on file   Social History Narrative   Lives at home with Lonn Georgia, roommate   Caffeine use: 1 Coffee day, 1-2  a day of tea, 1-2 sodas a day     Family History  Problem Relation Age of Onset  . Heart disease Mother   . Heart disease Father     ROS- All systems are reviewed and negative except as per the HPI above  Physical Exam: Filed Vitals:   05/06/15 1434  BP: 120/82  Pulse: 73  Height: 5\' 8"  (1.727 m)  Weight: 253 lb 6.4 oz (114.941 kg)    GEN- The patient is well appearing, alert and oriented x 3 today.   Head- normocephalic, atraumatic Eyes-  Sclera clear, conjunctiva pink Ears- hearing intact Oropharynx- clear Neck- supple, no JVP Lymph- no cervical lymphadenopathy Lungs- Clear to ausculation bilaterally, normal work of breathing Heart- Regular rate and rhythm, no murmurs, rubs or gallops, PMI not laterally displaced GI- soft, NT, ND, + BS Extremities- no clubbing, cyanosis, or edema MS- no significant deformity or atrophy Skin- no rash or lesion Psych- euthymic mood, full affect Neuro- strength and sensation are intact  EKG- Sinus rhythm at 73 bpm. Pr int 148 ms, QRS 70 ms, Qtc 414 ms. Epic records reviewed. Echo- 04/02/15-Left ventricle: The cavity size was normal. Systolic function was normal. The estimated ejection fraction was in the range of 55% to 60%. Wall motion was normal; there were no regional wall motion abnormalities. - Aortic valve: There was mild regurgitation. - Mitral valve: There was trivial regurgitation. - Left atrium: The atrium was moderately dilated. - Atrial septum: A patent foramen ovale cannot be excluded. - Tricuspid valve: There was trivial regurgitation. - Recommendations: Consider agitated saline contrast study to rule out PFO.  Assessment and Plan:  1. Asymptomatic aflutter found on pre op EKG SR by EKG today  2. Chadsvasc score of at least 4 On xarelto, s/p surgery, doing well but may  want to switch to coumadin due to cost. Assistance number given to check with xarelto program..  3. Sleep apnea Currently untreated Asked pt to speak to PCP re new sleep study due to the contribution of sleep apnea to atrial arrhythmias.  4.Obesity Encouraged to continue with weight loss.  5. HTN Stable today Avoid salt  Has f/u with Dr. Marlou Porch to establish in August. He can decide if any f/u is needed for possible PFO on echo. Afib clinic as needed.

## 2015-05-12 ENCOUNTER — Other Ambulatory Visit: Payer: Self-pay | Admitting: Nurse Practitioner

## 2015-05-20 ENCOUNTER — Other Ambulatory Visit (HOSPITAL_COMMUNITY): Payer: Self-pay | Admitting: *Deleted

## 2015-05-20 MED ORDER — RIVAROXABAN 20 MG PO TABS: 20.0000 mg | ORAL_TABLET | Freq: Every day | ORAL | Status: DC

## 2015-05-22 LAB — CREATININE, SERUM
Creatinine, Ser: 0.8 mg/dL (ref 0.44–1.00)
GFR calc Af Amer: >60 mL/min (ref >60)

## 2015-05-25 ENCOUNTER — Other Ambulatory Visit: Payer: Self-pay | Admitting: Nurse Practitioner

## 2015-06-10 ENCOUNTER — Ambulatory Visit (INDEPENDENT_AMBULATORY_CARE_PROVIDER_SITE_OTHER): Payer: PPO | Admitting: Cardiology

## 2015-06-10 ENCOUNTER — Encounter: Payer: Self-pay | Admitting: Cardiology

## 2015-06-10 VITALS — BP 124/62 | HR 62 | Ht 68.0 in | Wt 253.0 lb

## 2015-06-10 DIAGNOSIS — I10 Essential (primary) hypertension: Secondary | ICD-10-CM | POA: Diagnosis not present

## 2015-06-10 NOTE — Patient Instructions (Signed)
Medication Instructions:  Your physician recommends that you continue on your current medications as directed. Please refer to the Current Medication list given to you today.  Follow-Up: Follow up in 6 months with Dr. Skains.  You will receive a letter in the mail 2 months before you are due.  Please call us when you receive this letter to schedule your follow up appointment.  Thank you for choosing [facility]!!     

## 2015-06-10 NOTE — Progress Notes (Signed)
[place]. [address], [address] [place], [place]  [address] Phone: [phone] Fax:  [phone]  Date:  06/10/2015   ID:  Linda, Washington 09-30-1951, MRN [id number]  PCP:  Marda Stalker, PA-C   History of Present Illness: Linda Washington is a 64 y.o. female with morbid obesity, diabetes, hypertension, untreated sleep apnea left knee replacement who was found to be in typical flutter with variable AV block at approximately 60 bpm (June 2016, asymptomatic) and was originally referred to atrial fibrillation clinic for further evaluation, asymptomatic. Several years ago she was told that she had mitral valve prolapse and had been on beta blockers/preoperative antibody x-ray years. She states that she had a catheterization several years ago as well, 20% lesion but mitral valve was normal. She could not tolerate CPAP mask. No alcohol, no smoking, chads score 4 , Xarelto.  She is losing weight, swimming exercises. Treadmill.     Wt Readings from Last 3 Encounters:  06/10/15 253 lb (114.76 kg)  05/06/15 253 lb 6.4 oz (114.941 kg)  04/17/15 265 lb 12.8 oz (120.566 kg)     Past Medical History  Diagnosis Date  . Tremor   . Incontinence     takes Ditropan daily  . Neuropathy     takes Gabapentin daily  . RLS (restless legs syndrome)   . Basal cell carcinoma   . Hypothyroidism   . Hyperlipemia     takes Simvastatin daily  . Anxiety     takes Citalopram daily  . GERD (gastroesophageal reflux disease)     takes Omeprazole daily  . Hypertension     takes Enalapril daily  . Type 2 diabetes mellitus     takes Metformin and Amaryl daily  . Seasonal allergies     takes Claritin daioy as needed  . PONV (postoperative nausea and vomiting)   . History of Bell's palsy     left side  . History of bronchitis   . History of hiatal hernia   . Arthritis     generalized  . Joint swelling     left knee  . History of kidney stones   . Sleep apnea     pt. states that she does not  have a CPAP    Past Surgical History  Procedure Laterality Date  . Basal cell carcinoma excision      Nose  . Carpal tunnel release Bilateral   . Breast lumpectomy    . Knee arthroscopy      Right  . Total abdominal hysterectomy    . Cardiac catheterization      pt. states approximately 15 years ago  . Eye surgery Bilateral     cataract surgery  . Appendectomy    . Dilation and curettage of uterus    . Achilles tendon repair Right   . Lithotripsy  2013  . Total knee arthroplasty Left 04/08/2015    Procedure: LEFT TOTAL KNEE ARTHROPLASTY;  Surgeon: Frederik Pear, MD;  Location: [place];  Service: Orthopedics;  Laterality: Left;    Current Outpatient Prescriptions  Medication Sig Dispense Refill  . citalopram (CELEXA) 20 MG tablet Take 20 mg by mouth daily.    . cyclobenzaprine (FLEXERIL) 10 MG tablet Take 10 mg by mouth 3 (three) times daily as needed for muscle spasms.    . enalapril (VASOTEC) 10 MG tablet Take 10 mg by mouth at bedtime.     . gabapentin (NEURONTIN) 400 MG capsule Take 1-2  capsules (400-800 mg total) by mouth 3 (three) times daily. (Patient taking differently: Take 400-800 mg by mouth 3 (three) times daily. Take 400 mg in the morning and afternoon and take 800 mg in the evening) 180 capsule 6  . HYDROmorphone (DILAUDID) 4 MG tablet Take 1 tablet (4 mg total) by mouth every 3 (three) hours as needed for severe pain. 60 tablet 0  . loratadine (CLARITIN) 10 MG tablet Take 10 mg by mouth daily as needed for allergies or rhinitis.     . metFORMIN (GLUCOPHAGE) 1000 MG tablet Take 1,000 mg by mouth 2 (two) times daily with a meal.    . omeprazole (PRILOSEC) 40 MG capsule Take 40 mg by mouth daily.    Marland Kitchen oxybutynin (DITROPAN-XL) 10 MG 24 hr tablet Take 10 mg by mouth daily.     . rivaroxaban (XARELTO) 20 MG TABS tablet Take 1 tablet (20 mg total) by mouth daily with supper. 30 tablet 6  . simvastatin (ZOCOR) 20 MG tablet Take 20 mg by mouth daily at 6 PM.    . Vitamin D,  Ergocalciferol, (DRISDOL) 50000 UNITS CAPS capsule Take 50,000 Units by mouth every 7 (seven) days. Takes on Wednesday.     No current facility-administered medications for this visit.    Allergies:    Allergies  Allergen Reactions  . Hydrocodone     Weird sensation  . Methocarbamol Hives  . Oxycodone     Weird sensation  . Latex Rash    Social History:  The patient  reports that she has never smoked. She does not have any smokeless tobacco history on file. She reports that she drinks alcohol. She reports that she does not use illicit drugs.   Family History  Problem Relation Age of Onset  . Heart disease Mother   . Heart disease Father     ROS:  Please see the history of present illness.   Denies any fevers, chills, orthopnea, PND, syncope   All other systems reviewed and negative.   PHYSICAL EXAM: VS:  BP 124/62 mmHg  Pulse 62  Ht 5\' 8"  (1.727 m)  Wt 253 lb (114.76 kg)  BMI 38.48 kg/m2 Well nourished, well developed, in no acute distress HEENT: normal, /AT, EOMI Neck: no JVD, normal carotid upstroke, no bruit Cardiac:  normal S1, S2; RRR; no murmur Lungs:  clear to auscultation bilaterally, no wheezing, rhonchi or rales Abd: soft, nontender, no hepatomegaly, no bruits Ext: no edema, 2+ distal pulses Skin: warm and dry GU: deferred Neuro: no focal abnormalities noted, AAO x 3  EKG- 03/28/15-atrial fibrillation/flutter rate controlled 05/06/15:Sinus rhythm at 73 bpm. Pr int 148 ms, QRS 70 ms, Qtc 414 ms. Today, 06/10/15-sinus rhythm, heart rate 62, no other abnormalities.  Epic records reviewed.  Echo- 04/02/15-Left ventricle: The cavity size was normal. Systolic function was normal. The estimated ejection fraction was in the range of 55% to 60%. Wall motion was normal; there were no regional wallmotion abnormalities. - Aortic valve: There was mild regurgitation. - Mitral valve: There was trivial regurgitation. - Left atrium: The atrium was moderately dilated. -  Atrial septum: A patent foramen ovale cannot be excluded. - Tricuspid valve: There was trivial regurgitation. - Recommendations: Consider agitated saline contrast study to ruleout PFO.     Labs: 04/08/15-hemoglobin A1c 6.4, hemoglobin 11.1, creatinine 0.72  ASSESSMENT AND PLAN:  1. Paroxysmal atrial fibrillation-asymptomatic. Continuing with anticoagulation. Chadsvasc score of at least 4. Last EKG with atrial fibrillation/flutter was on 03/28/15. 2. Chronic anticoagulation-Xarelto. She  thinks she may be having headaches from this medication. We'll continue to monitor. Certainly this could be musculoskeletal strain given her new exercises she is doing. Headaches would be an unusual side effect. There are no neurologic sequela. Nonfocal. She pays $40. 3. Essential hypertension-currently stable, medicines reviewed, salt restriction 4. Obesity-continue to encourage weight loss, exercise.  5. Knee arthritis-post knee replacement. Visited emergency room on 04/28/15 with "bump" on file thought to be a lymph node. No DVT. Might be hematoma she states. Dr. Mayer Camel 6. Hyperlipidemia-simvastatin. Continue 7. Diabetes mellitus-per primary physician. 8. Six-month follow-up.    Signed, Candee Furbish, MD [facility]  06/10/2015 8:23 AM

## 2015-07-04 ENCOUNTER — Other Ambulatory Visit: Payer: Self-pay | Admitting: Nurse Practitioner

## 2015-07-05 ENCOUNTER — Other Ambulatory Visit (HOSPITAL_COMMUNITY): Payer: Self-pay | Admitting: *Deleted

## 2015-07-05 MED ORDER — RIVAROXABAN 20 MG PO TABS: 20.0000 mg | ORAL_TABLET | Freq: Every day | ORAL | Status: DC

## 2015-07-13 ENCOUNTER — Other Ambulatory Visit: Payer: Self-pay | Admitting: Nurse Practitioner

## 2015-08-27 ENCOUNTER — Other Ambulatory Visit: Payer: Self-pay | Admitting: Nurse Practitioner

## 2015-09-05 ENCOUNTER — Other Ambulatory Visit: Payer: Self-pay | Admitting: Nurse Practitioner

## 2015-09-11 ENCOUNTER — Ambulatory Visit: Payer: PPO | Admitting: Podiatry

## 2015-10-09 ENCOUNTER — Ambulatory Visit: Payer: PPO | Admitting: Podiatry

## 2015-10-16 ENCOUNTER — Encounter: Payer: Self-pay | Admitting: Podiatry

## 2015-10-16 ENCOUNTER — Ambulatory Visit (INDEPENDENT_AMBULATORY_CARE_PROVIDER_SITE_OTHER): Payer: PPO | Admitting: Podiatry

## 2015-10-16 VITALS — BP 143/71 | HR 56 | Resp 14

## 2015-10-16 DIAGNOSIS — M79676 Pain in unspecified toe(s): Secondary | ICD-10-CM | POA: Diagnosis not present

## 2015-10-16 DIAGNOSIS — M79675 Pain in left toe(s): Secondary | ICD-10-CM | POA: Diagnosis not present

## 2015-10-16 DIAGNOSIS — E119 Type 2 diabetes mellitus without complications: Secondary | ICD-10-CM

## 2015-10-16 DIAGNOSIS — B351 Tinea unguium: Secondary | ICD-10-CM | POA: Diagnosis not present

## 2015-10-16 NOTE — Progress Notes (Signed)
Subjective:     Patient ID: Linda Washington, female   DOB: [birth year], 64 y.o.   MRN: [id number]  HPI this patient presents to the office for an evaluation of her toenails, both feet. She says her left big toenail actually fell off approximately a year ago and has grown back the nail has become thick and even unattached from the nailbed. She does not state having much pain or discomfort or drainage from the site. She presents for evaluation of her nail as well as a diabetic exam   Review of Systems     Objective:   Physical Exam GENERAL APPEARANCE: Alert, conversant. Appropriately groomed. No acute distress.  VASCULAR: Pedal pulses palpable at  [facility] and PT bilateral.  Capillary refill time is immediate to all digits,  Normal temperature gradient.  Digital hair growth is present bilateral  NEUROLOGIC: sensation is normal to 5.07 monofilament at 5/5 sites bilateral.  Light touch is intact bilateral, Muscle strength normal.  MUSCULOSKELETAL: acceptable muscle strength, tone and stability bilateral.  Intrinsic muscluature intact bilateral.  Rectus appearance of foot and digits noted bilateral.   DERMATOLOGIC: skin color, texture, and turgor are within normal limits.  No preulcerative lesions or ulcers  are seen, no interdigital maceration noted.  No open lesions present.  . No drainage noted.  NAILS  Thick disfigutred discolored left hallux toenail.  No redness or infection noted.      Assessment:     Onychomycosis  Left hallux     Plan:     IE  Debridement of Nails   RTC 4 months.  Gardiner Barefoot DPM

## 2015-12-09 DIAGNOSIS — E1142 Type 2 diabetes mellitus with diabetic polyneuropathy: Secondary | ICD-10-CM | POA: Diagnosis not present

## 2015-12-09 DIAGNOSIS — Z6839 Body mass index (BMI) 39.0-39.9, adult: Secondary | ICD-10-CM | POA: Diagnosis not present

## 2015-12-09 DIAGNOSIS — Z7984 Long term (current) use of oral hypoglycemic drugs: Secondary | ICD-10-CM | POA: Diagnosis not present

## 2015-12-09 DIAGNOSIS — M2042 Other hammer toe(s) (acquired), left foot: Secondary | ICD-10-CM | POA: Diagnosis not present

## 2015-12-18 DIAGNOSIS — J069 Acute upper respiratory infection, unspecified: Secondary | ICD-10-CM | POA: Diagnosis not present

## 2016-01-29 DIAGNOSIS — F418 Other specified anxiety disorders: Secondary | ICD-10-CM | POA: Diagnosis not present

## 2016-01-29 DIAGNOSIS — E1140 Type 2 diabetes mellitus with diabetic neuropathy, unspecified: Secondary | ICD-10-CM | POA: Diagnosis not present

## 2016-01-29 DIAGNOSIS — E039 Hypothyroidism, unspecified: Secondary | ICD-10-CM | POA: Diagnosis not present

## 2016-01-29 DIAGNOSIS — N3281 Overactive bladder: Secondary | ICD-10-CM | POA: Diagnosis not present

## 2016-01-29 DIAGNOSIS — K219 Gastro-esophageal reflux disease without esophagitis: Secondary | ICD-10-CM | POA: Diagnosis not present

## 2016-01-29 DIAGNOSIS — E7800 Pure hypercholesterolemia, unspecified: Secondary | ICD-10-CM | POA: Diagnosis not present

## 2016-01-29 DIAGNOSIS — I10 Essential (primary) hypertension: Secondary | ICD-10-CM | POA: Diagnosis not present

## 2016-01-29 DIAGNOSIS — E559 Vitamin D deficiency, unspecified: Secondary | ICD-10-CM | POA: Diagnosis not present

## 2016-02-05 ENCOUNTER — Ambulatory Visit: Payer: PPO | Admitting: Podiatry

## 2016-02-14 ENCOUNTER — Ambulatory Visit: Payer: PPO | Admitting: Cardiology

## 2016-02-28 ENCOUNTER — Encounter: Payer: Self-pay | Admitting: *Deleted

## 2016-03-04 ENCOUNTER — Ambulatory Visit: Payer: PPO | Admitting: Cardiology

## 2016-04-08 ENCOUNTER — Ambulatory Visit: Payer: PPO | Admitting: Podiatry

## 2016-04-16 ENCOUNTER — Ambulatory Visit (INDEPENDENT_AMBULATORY_CARE_PROVIDER_SITE_OTHER): Payer: Commercial Managed Care - HMO | Admitting: Cardiology

## 2016-04-16 ENCOUNTER — Encounter: Payer: Self-pay | Admitting: Cardiology

## 2016-04-16 VITALS — BP 130/76 | HR 56 | Ht 68.0 in | Wt 265.1 lb

## 2016-04-16 DIAGNOSIS — I4892 Unspecified atrial flutter: Secondary | ICD-10-CM | POA: Diagnosis not present

## 2016-04-16 DIAGNOSIS — I10 Essential (primary) hypertension: Secondary | ICD-10-CM

## 2016-04-16 DIAGNOSIS — I480 Paroxysmal atrial fibrillation: Secondary | ICD-10-CM | POA: Diagnosis not present

## 2016-04-16 MED ORDER — ASPIRIN EC 81 MG PO TBEC: 81.0000 mg | DELAYED_RELEASE_TABLET | Freq: Every day | ORAL | Status: DC

## 2016-04-16 NOTE — Patient Instructions (Signed)
Medication Instructions:  Please stop your Xarelto. Start ASA 81 mg a day. Continue all other medications as listed.  Follow-Up: Follow up in 6 months with Dr. Marlou Porch.  You will receive a letter in the mail 2 months before you are due.  Please call us when you receive this letter to schedule your follow up appointment.  If you need a refill on your cardiac medications before your next appointment, please call your pharmacy.  Thank you for choosing [facility]!!

## 2016-04-16 NOTE — Progress Notes (Signed)
[place]. [address] [place], [place]  [address] Phone: [phone] Fax:  [phone]  Date:  04/16/2016   ID:  NIYONNA MCFATE, DOB [birth year], MRN [id number]  PCP:  Marda Stalker, PA-C   History of Present Illness: Linda Washington is a 65 y.o. female with morbid obesity, diabetes, hypertension, untreated sleep apnea left knee replacement who was found to be in typical flutter with variable AV block at approximately 60 bpm (June 2016, asymptomatic) and was originally referred to atrial fibrillation clinic for further evaluation, asymptomatic. Several years ago she was told that she had mitral valve prolapse and had been on beta blockers/preoperative antibody x-ray years. She states that she had a catheterization several years ago as well, 20% lesion but mitral valve was normal. She could not tolerate CPAP mask. No alcohol, no smoking, chads score 4 , Xarelto.   She is trying to decrease weight, swimming exercises. Treadmill.  She has not had any further episodes of atrial fibrillation since her knee surgery. No chest pain, no shortness of breath.     Wt Readings from Last 3 Encounters:  04/16/16 265 lb 1.9 oz (120.258 kg)  06/10/15 253 lb (114.76 kg)  05/06/15 253 lb 6.4 oz (114.941 kg)     Past Medical History  Diagnosis Date  . Tremor   . Incontinence     takes Ditropan daily  . Neuropathy (HCC)     takes Gabapentin daily  . RLS (restless legs syndrome)   . Basal cell carcinoma   . Hypothyroidism   . Hyperlipemia     takes Simvastatin daily  . Anxiety     takes Citalopram daily  . GERD (gastroesophageal reflux disease)     takes Omeprazole daily  . Hypertension     takes Enalapril daily  . Type 2 diabetes mellitus (HCC)     takes Metformin and Amaryl daily  . Seasonal allergies     takes Claritin daioy as needed  . PONV (postoperative nausea and vomiting)   . History of Bell's palsy     left side  . History of bronchitis   . History of hiatal  hernia   . Arthritis     generalized  . Joint swelling     left knee  . History of kidney stones   . Sleep apnea     pt. states that she does not have a CPAP    Past Surgical History  Procedure Laterality Date  . Basal cell carcinoma excision      Nose  . Carpal tunnel release Bilateral   . Breast lumpectomy    . Knee arthroscopy      Right  . Total abdominal hysterectomy    . Cardiac catheterization      pt. states approximately 15 years ago  . Eye surgery Bilateral     cataract surgery  . Appendectomy    . Dilation and curettage of uterus    . Achilles tendon repair Right   . Lithotripsy  2013  . Total knee arthroplasty Left 04/08/2015    Procedure: LEFT TOTAL KNEE ARTHROPLASTY;  Surgeon: Frederik Pear, MD;  Location: [facility];  Service: Orthopedics;  Laterality: Left;    Current Outpatient Prescriptions  Medication Sig Dispense Refill  . citalopram (CELEXA) 20 MG tablet Take 20 mg by mouth daily.    . enalapril (VASOTEC) 10 MG tablet Take 10 mg by mouth at bedtime.     Marland Kitchen glimepiride (AMARYL) 1  MG tablet Take 1 mg by mouth daily with breakfast.    . metFORMIN (GLUCOPHAGE) 1000 MG tablet Take 1,000 mg by mouth 2 (two) times daily with a meal.    . omeprazole (PRILOSEC) 40 MG capsule Take 40 mg by mouth daily.    Marland Kitchen oxybutynin (DITROPAN-XL) 10 MG 24 hr tablet Take 10 mg by mouth daily.     . rivaroxaban (XARELTO) 20 MG TABS tablet Take 1 tablet (20 mg total) by mouth daily with supper. 30 tablet 11  . simvastatin (ZOCOR) 20 MG tablet Take 20 mg by mouth daily at 6 PM.     No current facility-administered medications for this visit.    Allergies:    Allergies  Allergen Reactions  . Hydrocodone     Weird sensation  . Methocarbamol Hives  . Oxycodone     Weird sensation  . Latex Rash    Social History:  The patient  reports that she has never smoked. She does not have any smokeless tobacco history on file. She reports that she drinks alcohol. She reports that she does  not use illicit drugs.   Family History  Problem Relation Age of Onset  . Heart disease Mother   . Heart disease Father     ROS:  Please see the history of present illness.   Denies any fevers, chills, orthopnea, PND, syncope   All other systems reviewed and negative.   PHYSICAL EXAM: VS:  BP 130/76 mmHg  Pulse 56  Ht 5\' 8"  (1.727 m)  Wt 265 lb 1.9 oz (120.258 kg)  BMI 40.32 kg/m2 Well nourished, well developed, in no acute distress HEENT: normal, [place]/AT, EOMI Neck: no JVD, normal carotid upstroke, no bruit Cardiac:  normal S1, S2; RRR; no murmur Lungs:  clear to auscultation bilaterally, no wheezing, rhonchi or rales Abd: soft, nontender, no hepatomegaly, no bruits Ext: no edema, 2+ distal pulses Skin: warm and dry GU: deferred Neuro: no focal abnormalities noted, AAO x 3  EKG-EKG was done today-04/16/16 sinus bradycardia There are P waves present in lead 2. nonspecific ST-T wave changes 03/28/15-atrial fibrillation/flutter rate controlled 05/06/15:Sinus rhythm at 73 bpm. Pr int 148 ms, QRS 70 ms, Qtc 414 ms.  06/10/15-sinus rhythm, heart rate 62, no other abnormalities.  Epic records reviewed.  Echo- 04/02/15-Left ventricle: The cavity size was normal. Systolic function was normal. The estimated ejection fraction was in the range of 55% to 60%. Wall motion was normal; there were no regional wallmotion abnormalities. - Aortic valve: There was mild regurgitation. - Mitral valve: There was trivial regurgitation. - Left atrium: The atrium was moderately dilated. - Atrial septum: A patent foramen ovale cannot be excluded. - Tricuspid valve: There was trivial regurgitation. - Recommendations: Consider agitated saline contrast study to ruleout PFO.     Labs: 04/08/15-hemoglobin A1c 6.4, hemoglobin 11.1, creatinine 0.72   ASSESSMENT AND PLAN:  1. Paroxysmal atrial fibrillation-asymptomatic. Continuing with anticoagulation. Chadsvasc score of at least 4. Last EKG with atrial  fibrillation/flutter was on 03/28/15. We will go ahead and stop her Xarelto now since it is been almost a year since she has demonstrated atrial fibrillation. Her atrial fibrillation was postoperative. She understands that if atrial fibrillation returns and she is not on Xarelto that she may end up with increased risk of stroke. 2. Chronic anticoagulation-Xarelto. Stopping, see above. OK to restart ASA. 3. Essential hypertension-currently stable, medicines reviewed, salt restriction 4. Obesity-continue to encourage weight loss, exercise.  5. Knee arthritis-post knee replacement Dr. Mayer Camel 6. Hyperlipidemia-simvastatin.  Continue 7. Diabetes mellitus-per primary physician. 8. Six-month follow-up. If NSR again will likely be PRN after this.     Signed, Candee Furbish, MD [facility]  04/16/2016 3:34 PM

## 2016-04-22 NOTE — Addendum Note (Signed)
Addended by: Freada Bergeron on: 04/22/2016 04:46 PM   Modules accepted: Orders

## 2016-06-18 DIAGNOSIS — M2042 Other hammer toe(s) (acquired), left foot: Secondary | ICD-10-CM | POA: Diagnosis not present

## 2016-06-18 DIAGNOSIS — E1142 Type 2 diabetes mellitus with diabetic polyneuropathy: Secondary | ICD-10-CM | POA: Diagnosis not present

## 2016-06-18 DIAGNOSIS — Z6841 Body Mass Index (BMI) 40.0 and over, adult: Secondary | ICD-10-CM | POA: Diagnosis not present

## 2016-06-25 ENCOUNTER — Ambulatory Visit: Payer: Self-pay | Admitting: Podiatry

## 2016-07-01 ENCOUNTER — Ambulatory Visit (INDEPENDENT_AMBULATORY_CARE_PROVIDER_SITE_OTHER): Payer: Commercial Managed Care - HMO | Admitting: Podiatry

## 2016-07-01 ENCOUNTER — Encounter: Payer: Self-pay | Admitting: Podiatry

## 2016-07-01 VITALS — Ht 68.0 in | Wt 265.0 lb

## 2016-07-01 DIAGNOSIS — M2040 Other hammer toe(s) (acquired), unspecified foot: Secondary | ICD-10-CM

## 2016-07-01 DIAGNOSIS — M79675 Pain in left toe(s): Secondary | ICD-10-CM | POA: Diagnosis not present

## 2016-07-01 DIAGNOSIS — B351 Tinea unguium: Secondary | ICD-10-CM

## 2016-07-01 DIAGNOSIS — E119 Type 2 diabetes mellitus without complications: Secondary | ICD-10-CM | POA: Diagnosis not present

## 2016-07-01 DIAGNOSIS — M79676 Pain in unspecified toe(s): Secondary | ICD-10-CM

## 2016-07-01 DIAGNOSIS — M2010 Hallux valgus (acquired), unspecified foot: Secondary | ICD-10-CM

## 2016-07-01 NOTE — Progress Notes (Signed)
This patient presents the office stating that she was at the vestibule this weekend and she is now experiencing pain in the big toenail, left foot. She states it was aggravated walking and wearing her shoes. She also says she is a diabetic with neuropathy. Her medical doctor recommended she obtain a pair of diabetic shoes through this office. She presents the office for evaluation and treatment of her feet   GENERAL APPEARANCE: Alert, conversant. Appropriately groomed. No acute distress.  VASCULAR: Pedal pulses are  palpable at  [facility] and PT bilateral.  Capillary refill time is immediate to all digits,  Normal temperature gradient.   NEUROLOGIC: sensation is normal to 5.07 monofilament at 5/5 sites bilateral.  Light touch is intact bilateral, Muscle strength normal.  MUSCULOSKELETAL: acceptable muscle strength, tone and stability bilateral.  Intrinsic muscluature intact bilateral.  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  DERMATOLOGIC: skin color, texture, and turgor are within normal limits.  No preulcerative lesions or ulcers  are seen, no interdigital maceration noted.  No open lesions present.  Digital nails are asymptomatic. No drainage noted.  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  Initiate paperwork for diabetic shoes for DPN and bunion and hammer toes  B/L  RTC prn   Gardiner Barefoot DPM

## 2016-08-04 DIAGNOSIS — K219 Gastro-esophageal reflux disease without esophagitis: Secondary | ICD-10-CM | POA: Diagnosis not present

## 2016-08-04 DIAGNOSIS — N3281 Overactive bladder: Secondary | ICD-10-CM | POA: Diagnosis not present

## 2016-08-04 DIAGNOSIS — I10 Essential (primary) hypertension: Secondary | ICD-10-CM | POA: Diagnosis not present

## 2016-08-04 DIAGNOSIS — E559 Vitamin D deficiency, unspecified: Secondary | ICD-10-CM | POA: Diagnosis not present

## 2016-08-04 DIAGNOSIS — Z7984 Long term (current) use of oral hypoglycemic drugs: Secondary | ICD-10-CM | POA: Diagnosis not present

## 2016-08-04 DIAGNOSIS — E1142 Type 2 diabetes mellitus with diabetic polyneuropathy: Secondary | ICD-10-CM | POA: Diagnosis not present

## 2016-08-04 DIAGNOSIS — E039 Hypothyroidism, unspecified: Secondary | ICD-10-CM | POA: Diagnosis not present

## 2016-08-04 DIAGNOSIS — E7800 Pure hypercholesterolemia, unspecified: Secondary | ICD-10-CM | POA: Diagnosis not present

## 2016-09-04 ENCOUNTER — Telehealth: Payer: Self-pay | Admitting: Podiatry

## 2016-09-04 NOTE — Telephone Encounter (Signed)
Called patient to let her know we received authorization to get her measured for her diabetic shoes. Told her to call us back and we can get her scheduled at the [place] office on a Wednesday or a Thursday.

## 2016-09-09 ENCOUNTER — Ambulatory Visit: Payer: Commercial Managed Care - HMO | Admitting: Podiatry

## 2016-09-29 ENCOUNTER — Ambulatory Visit (INDEPENDENT_AMBULATORY_CARE_PROVIDER_SITE_OTHER): Payer: Commercial Managed Care - HMO

## 2016-09-29 ENCOUNTER — Ambulatory Visit: Payer: Commercial Managed Care - HMO

## 2016-09-29 DIAGNOSIS — Z7984 Long term (current) use of oral hypoglycemic drugs: Secondary | ICD-10-CM | POA: Diagnosis not present

## 2016-09-29 DIAGNOSIS — E119 Type 2 diabetes mellitus without complications: Secondary | ICD-10-CM

## 2016-09-29 DIAGNOSIS — Z6841 Body Mass Index (BMI) 40.0 and over, adult: Secondary | ICD-10-CM | POA: Diagnosis not present

## 2016-09-29 DIAGNOSIS — Z5181 Encounter for therapeutic drug level monitoring: Secondary | ICD-10-CM | POA: Diagnosis not present

## 2016-09-29 DIAGNOSIS — E1142 Type 2 diabetes mellitus with diabetic polyneuropathy: Secondary | ICD-10-CM | POA: Diagnosis not present

## 2016-09-29 DIAGNOSIS — M2042 Other hammer toe(s) (acquired), left foot: Secondary | ICD-10-CM | POA: Diagnosis not present

## 2016-09-30 NOTE — Progress Notes (Signed)
Pt presents to be measured for diabetic shoe. Measured and molded with cast foam box

## 2016-10-05 DIAGNOSIS — H52203 Unspecified astigmatism, bilateral: Secondary | ICD-10-CM | POA: Diagnosis not present

## 2016-10-05 DIAGNOSIS — H43813 Vitreous degeneration, bilateral: Secondary | ICD-10-CM | POA: Diagnosis not present

## 2016-10-05 DIAGNOSIS — H26491 Other secondary cataract, right eye: Secondary | ICD-10-CM | POA: Diagnosis not present

## 2016-10-05 DIAGNOSIS — E119 Type 2 diabetes mellitus without complications: Secondary | ICD-10-CM | POA: Diagnosis not present

## 2016-10-29 DIAGNOSIS — H26491 Other secondary cataract, right eye: Secondary | ICD-10-CM | POA: Diagnosis not present

## 2016-11-03 DIAGNOSIS — K006 Disturbances in tooth eruption: Secondary | ICD-10-CM | POA: Diagnosis not present

## 2016-11-11 ENCOUNTER — Telehealth: Payer: Self-pay | Admitting: Podiatry

## 2016-11-11 NOTE — Telephone Encounter (Signed)
Pt called to check on status of diabetic shoes.

## 2016-11-13 ENCOUNTER — Encounter: Payer: Self-pay | Admitting: Podiatry

## 2016-11-13 ENCOUNTER — Ambulatory Visit (INDEPENDENT_AMBULATORY_CARE_PROVIDER_SITE_OTHER): Payer: Medicare HMO | Admitting: Podiatry

## 2016-11-13 DIAGNOSIS — M2041 Other hammer toe(s) (acquired), right foot: Secondary | ICD-10-CM | POA: Diagnosis not present

## 2016-11-13 DIAGNOSIS — M2010 Hallux valgus (acquired), unspecified foot: Secondary | ICD-10-CM

## 2016-11-13 DIAGNOSIS — M2012 Hallux valgus (acquired), left foot: Secondary | ICD-10-CM | POA: Diagnosis not present

## 2016-11-13 DIAGNOSIS — M2011 Hallux valgus (acquired), right foot: Secondary | ICD-10-CM | POA: Diagnosis not present

## 2016-11-13 DIAGNOSIS — M2040 Other hammer toe(s) (acquired), unspecified foot: Secondary | ICD-10-CM | POA: Diagnosis not present

## 2016-11-13 DIAGNOSIS — M2042 Other hammer toe(s) (acquired), left foot: Secondary | ICD-10-CM | POA: Diagnosis not present

## 2016-11-13 DIAGNOSIS — E119 Type 2 diabetes mellitus without complications: Secondary | ICD-10-CM

## 2016-11-13 NOTE — Progress Notes (Signed)
This patient presents the office stating that she is coming to the office for pick up of her diabetic shoes.     GENERAL APPEARANCE: Alert, conversant. Appropriately groomed. No acute distress.  VASCULAR: Pedal pulses are  palpable at  [facility] and PT bilateral.  Capillary refill time is immediate to all digits,  Normal temperature gradient.   NEUROLOGIC: sensation is normal to 5.07 monofilament at 5/5 sites bilateral.  Light touch is intact bilateral, Muscle strength normal.  MUSCULOSKELETAL: acceptable muscle strength, tone and stability bilateral.  Intrinsic muscluature intact bilateral.  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  DERMATOLOGIC: skin color, texture, and turgor are within normal limits.  No preulcerative lesions or ulcers  are seen, no interdigital maceration noted.  No open lesions present.  Digital nails are asymptomatic. No drainage noted.  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 The shoes and the inserts are fitted to the patients ' feet and are noted to fit well and are free of defect.  Length and width of the shoes are also acceptable.  Patient was given written and verbal  instructions for wearing.  If any concerns arrive with the shoes or inserts, the patient is to call the office.Patient is to follow up with doctor in six weeks. B/L  RTC prn   Gardiner Barefoot DPM

## 2016-11-25 DIAGNOSIS — R52 Pain, unspecified: Secondary | ICD-10-CM | POA: Diagnosis not present

## 2017-02-01 ENCOUNTER — Emergency Department (HOSPITAL_COMMUNITY): Payer: Medicare HMO

## 2017-02-01 ENCOUNTER — Observation Stay (HOSPITAL_COMMUNITY)
Admission: EM | Admit: 2017-02-01 | Discharge: 2017-02-01 | Disposition: A | Discharge status: Home / Self Care | Payer: Medicare HMO | Attending: Cardiology | Admitting: Cardiology

## 2017-02-01 ENCOUNTER — Encounter (HOSPITAL_COMMUNITY)
Admission: EM | Disposition: A | Discharge status: Home / Self Care | Payer: Self-pay | Source: Home / Self Care | Attending: Emergency Medicine

## 2017-02-01 ENCOUNTER — Encounter (HOSPITAL_COMMUNITY): Payer: Self-pay | Admitting: Radiology

## 2017-02-01 DIAGNOSIS — Z7984 Long term (current) use of oral hypoglycemic drugs: Secondary | ICD-10-CM | POA: Diagnosis not present

## 2017-02-01 DIAGNOSIS — K7689 Other specified diseases of liver: Secondary | ICD-10-CM | POA: Diagnosis not present

## 2017-02-01 DIAGNOSIS — M1990 Unspecified osteoarthritis, unspecified site: Secondary | ICD-10-CM | POA: Diagnosis not present

## 2017-02-01 DIAGNOSIS — I208 Other forms of angina pectoris: Secondary | ICD-10-CM | POA: Diagnosis present

## 2017-02-01 DIAGNOSIS — R7989 Other specified abnormal findings of blood chemistry: Secondary | ICD-10-CM | POA: Diagnosis not present

## 2017-02-01 DIAGNOSIS — K219 Gastro-esophageal reflux disease without esophagitis: Secondary | ICD-10-CM | POA: Insufficient documentation

## 2017-02-01 DIAGNOSIS — E785 Hyperlipidemia, unspecified: Secondary | ICD-10-CM | POA: Insufficient documentation

## 2017-02-01 DIAGNOSIS — Z7951 Long term (current) use of inhaled steroids: Secondary | ICD-10-CM | POA: Diagnosis not present

## 2017-02-01 DIAGNOSIS — E1140 Type 2 diabetes mellitus with diabetic neuropathy, unspecified: Secondary | ICD-10-CM | POA: Diagnosis not present

## 2017-02-01 DIAGNOSIS — I10 Essential (primary) hypertension: Secondary | ICD-10-CM | POA: Insufficient documentation

## 2017-02-01 DIAGNOSIS — I200 Unstable angina: Principal | ICD-10-CM

## 2017-02-01 DIAGNOSIS — E039 Hypothyroidism, unspecified: Secondary | ICD-10-CM | POA: Insufficient documentation

## 2017-02-01 DIAGNOSIS — Z885 Allergy status to narcotic agent status: Secondary | ICD-10-CM | POA: Insufficient documentation

## 2017-02-01 DIAGNOSIS — K429 Umbilical hernia without obstruction or gangrene: Secondary | ICD-10-CM | POA: Insufficient documentation

## 2017-02-01 DIAGNOSIS — G2581 Restless legs syndrome: Secondary | ICD-10-CM | POA: Insufficient documentation

## 2017-02-01 DIAGNOSIS — R079 Chest pain, unspecified: Secondary | ICD-10-CM | POA: Diagnosis not present

## 2017-02-01 DIAGNOSIS — R32 Unspecified urinary incontinence: Secondary | ICD-10-CM | POA: Insufficient documentation

## 2017-02-01 DIAGNOSIS — E119 Type 2 diabetes mellitus without complications: Secondary | ICD-10-CM

## 2017-02-01 DIAGNOSIS — F419 Anxiety disorder, unspecified: Secondary | ICD-10-CM | POA: Insufficient documentation

## 2017-02-01 DIAGNOSIS — G4733 Obstructive sleep apnea (adult) (pediatric): Secondary | ICD-10-CM | POA: Insufficient documentation

## 2017-02-01 DIAGNOSIS — Z7982 Long term (current) use of aspirin: Secondary | ICD-10-CM | POA: Insufficient documentation

## 2017-02-01 DIAGNOSIS — Z91040 Latex allergy status: Secondary | ICD-10-CM | POA: Insufficient documentation

## 2017-02-01 DIAGNOSIS — I209 Angina pectoris, unspecified: Secondary | ICD-10-CM | POA: Diagnosis not present

## 2017-02-01 DIAGNOSIS — I214 Non-ST elevation (NSTEMI) myocardial infarction: Secondary | ICD-10-CM | POA: Diagnosis not present

## 2017-02-01 DIAGNOSIS — Z8249 Family history of ischemic heart disease and other diseases of the circulatory system: Secondary | ICD-10-CM | POA: Diagnosis not present

## 2017-02-01 HISTORY — PX: LEFT HEART CATH AND CORONARY ANGIOGRAPHY: CATH118249

## 2017-02-01 LAB — I-STAT CHEM 8, ED
BUN: 11 mg/dL (ref 6–20)
Calcium, Ion: 1.06 mmol/L — ABNORMAL LOW (ref 1.15–1.40)
Chloride: 105 mmol/L (ref 101–111)
Creatinine, Ser: 0.8 mg/dL (ref 0.44–1.00)
GLUCOSE: 139 mg/dL — AB (ref 65–99)
HEMATOCRIT: 38 % (ref 36.0–46.0)
HEMOGLOBIN: 12.9 g/dL (ref 12.0–15.0)
POTASSIUM: 3.7 mmol/L (ref 3.5–5.1)
SODIUM: 138 mmol/L (ref 135–145)
TCO2: 24 mmol/L (ref 0–100)

## 2017-02-01 LAB — CBC
HCT: 37.9 % (ref 36.0–46.0)
Hemoglobin: 12.2 g/dL (ref 12.0–15.0)
MCH: 28 pg (ref 26.0–34.0)
MCHC: 32.2 g/dL (ref 30.0–36.0)
MCV: 87.1 fL (ref 78.0–100.0)
Platelets: 346 10*3/uL (ref 150–400)
RBC: 4.35 MIL/uL (ref 3.87–5.11)
RDW: 15.7 % — ABNORMAL HIGH (ref 11.5–15.5)
WBC: 9 10*3/uL (ref 4.0–10.5)

## 2017-02-01 LAB — BASIC METABOLIC PANEL
Anion gap: 13 (ref 5–15)
BUN: 10 mg/dL (ref 6–20)
CALCIUM: 9.1 mg/dL (ref 8.9–10.3)
CHLORIDE: 102 mmol/L (ref 101–111)
CO2: 23 mmol/L (ref 22–32)
CREATININE: 0.93 mg/dL (ref 0.44–1.00)
GFR calc non Af Amer: >60 mL/min (ref >60)
Glucose, Bld: 135 mg/dL — ABNORMAL HIGH (ref 65–99)
Potassium: 3.7 mmol/L (ref 3.5–5.1)
SODIUM: 138 mmol/L (ref 135–145)

## 2017-02-01 LAB — PROTIME-INR
INR: 0.92
Prothrombin Time: 12.4 seconds (ref 11.4–15.2)

## 2017-02-01 LAB — I-STAT TROPONIN, ED: Troponin i, poc: 0.12 ng/mL (ref 0.00–0.08)

## 2017-02-01 LAB — TROPONIN I
TROPONIN I: 0.1 ng/mL — AB (ref <0.03)
Troponin I: 0.11 ng/mL (ref <0.03)

## 2017-02-01 SURGERY — LEFT HEART CATH AND CORONARY ANGIOGRAPHY
Anesthesia: LOCAL

## 2017-02-01 MED ORDER — CITALOPRAM HYDROBROMIDE 20 MG PO TABS
20.0000 mg | ORAL_TABLET | Freq: Every day | ORAL | Status: DC
  Administered 2017-02-01: 20 mg via ORAL
  Filled 2017-02-01: qty 1

## 2017-02-01 MED ORDER — ASPIRIN 81 MG PO CHEW: 81.0000 mg | CHEWABLE_TABLET | ORAL | Status: DC

## 2017-02-01 MED ORDER — SODIUM CHLORIDE 0.9% FLUSH: 3.0000 mL | Freq: Two times a day (BID) | INTRAVENOUS | Status: DC

## 2017-02-01 MED ORDER — ASPIRIN 81 MG PO CHEW: 81.0000 mg | CHEWABLE_TABLET | Freq: Every day | ORAL | Status: DC

## 2017-02-01 MED ORDER — SODIUM CHLORIDE 0.9% FLUSH: 3.0000 mL | INTRAVENOUS | Status: DC | PRN

## 2017-02-01 MED ORDER — HEPARIN (PORCINE) IN NACL 100-0.45 UNIT/ML-% IJ SOLN
1200.0000 [IU]/h | INTRAMUSCULAR | Status: DC
  Administered 2017-02-01: 1200 [IU]/h via INTRAVENOUS
  Filled 2017-02-01: qty 250

## 2017-02-01 MED ORDER — NITROGLYCERIN 1 MG/10 ML FOR IR/CATH LAB
INTRA_ARTERIAL | Status: AC
  Filled 2017-02-01: qty 10

## 2017-02-01 MED ORDER — SODIUM CHLORIDE 0.9 % IV SOLN: 250.0000 mL | INTRAVENOUS | Status: DC | PRN

## 2017-02-01 MED ORDER — HEPARIN (PORCINE) IN NACL 2-0.9 UNIT/ML-% IJ SOLN
INTRAMUSCULAR | Status: DC | PRN
  Administered 2017-02-01: 1000 mL

## 2017-02-01 MED ORDER — ATORVASTATIN CALCIUM 40 MG PO TABS
40.0000 mg | ORAL_TABLET | Freq: Every day | ORAL | Status: DC
  Administered 2017-02-01: 40 mg via ORAL
  Filled 2017-02-01: qty 1

## 2017-02-01 MED ORDER — ACETAMINOPHEN 325 MG PO TABS: 650.0000 mg | ORAL_TABLET | ORAL | Status: DC | PRN

## 2017-02-01 MED ORDER — ASPIRIN 81 MG PO CHEW: 81.0000 mg | CHEWABLE_TABLET | Freq: Once | ORAL | Status: DC

## 2017-02-01 MED ORDER — SODIUM CHLORIDE 0.9 % IV SOLN: INTRAVENOUS | Status: AC

## 2017-02-01 MED ORDER — VERAPAMIL HCL 2.5 MG/ML IV SOLN
INTRAVENOUS | Status: AC
  Filled 2017-02-01: qty 2

## 2017-02-01 MED ORDER — PANTOPRAZOLE SODIUM 40 MG PO TBEC: 40.0000 mg | DELAYED_RELEASE_TABLET | Freq: Every day | ORAL | Status: DC

## 2017-02-01 MED ORDER — HEPARIN SODIUM (PORCINE) 1000 UNIT/ML IJ SOLN
INTRAMUSCULAR | Status: AC
  Filled 2017-02-01: qty 1

## 2017-02-01 MED ORDER — OXYBUTYNIN CHLORIDE ER 15 MG PO TB24: 15.0000 mg | ORAL_TABLET | Freq: Every day | ORAL | Status: DC

## 2017-02-01 MED ORDER — LIDOCAINE HCL (PF) 1 % IJ SOLN
INTRAMUSCULAR | Status: DC | PRN
  Administered 2017-02-01: 2 mL

## 2017-02-01 MED ORDER — ASPIRIN EC 81 MG PO TBEC: 81.0000 mg | DELAYED_RELEASE_TABLET | Freq: Every day | ORAL | Status: DC

## 2017-02-01 MED ORDER — LIDOCAINE HCL (PF) 1 % IJ SOLN
INTRAMUSCULAR | Status: AC
  Filled 2017-02-01: qty 30

## 2017-02-01 MED ORDER — HEPARIN BOLUS VIA INFUSION
4000.0000 [IU] | Freq: Once | INTRAVENOUS | Status: AC
  Administered 2017-02-01: 4000 [IU] via INTRAVENOUS
  Filled 2017-02-01: qty 4000

## 2017-02-01 MED ORDER — IOPAMIDOL (ISOVUE-370) INJECTION 76%
INTRAVENOUS | Status: AC
Start: 2017-02-01 — End: 2017-02-01
  Administered 2017-02-01: 100 mL
  Filled 2017-02-01: qty 100

## 2017-02-01 MED ORDER — HEPARIN SODIUM (PORCINE) 1000 UNIT/ML IJ SOLN
INTRAMUSCULAR | Status: DC | PRN
  Administered 2017-02-01: 6000 [IU] via INTRAVENOUS

## 2017-02-01 MED ORDER — PANTOPRAZOLE SODIUM 40 MG PO TBEC
40.0000 mg | DELAYED_RELEASE_TABLET | Freq: Every day | ORAL | Status: DC
  Administered 2017-02-01: 40 mg via ORAL
  Filled 2017-02-01: qty 1

## 2017-02-01 MED ORDER — NITROGLYCERIN 2 % TD OINT
0.5000 [in_us] | TOPICAL_OINTMENT | Freq: Once | TRANSDERMAL | Status: AC
  Administered 2017-02-01: 0.5 [in_us] via TOPICAL
  Filled 2017-02-01: qty 1

## 2017-02-01 MED ORDER — SODIUM CHLORIDE 0.9% FLUSH
3.0000 mL | Freq: Two times a day (BID) | INTRAVENOUS | Status: DC
  Administered 2017-02-01: 3 mL via INTRAVENOUS

## 2017-02-01 MED ORDER — HEPARIN (PORCINE) IN NACL 2-0.9 UNIT/ML-% IJ SOLN
INTRAMUSCULAR | Status: AC
  Filled 2017-02-01: qty 1000

## 2017-02-01 MED ORDER — SODIUM CHLORIDE 0.9 % IV SOLN
INTRAVENOUS | Status: DC
  Administered 2017-02-01: 09:00:00 via INTRAVENOUS

## 2017-02-01 MED ORDER — IOPAMIDOL (ISOVUE-370) INJECTION 76%
INTRAVENOUS | Status: DC | PRN
  Administered 2017-02-01: 70 mL via INTRA_ARTERIAL

## 2017-02-01 MED ORDER — NITROGLYCERIN 0.4 MG SL SUBL: 0.4000 mg | SUBLINGUAL_TABLET | SUBLINGUAL | Status: DC | PRN

## 2017-02-01 MED ORDER — ENALAPRIL MALEATE 10 MG PO TABS: 10.0000 mg | ORAL_TABLET | Freq: Every day | ORAL | Status: DC

## 2017-02-01 MED ORDER — ONDANSETRON HCL 4 MG/2ML IJ SOLN: 4.0000 mg | Freq: Four times a day (QID) | INTRAMUSCULAR | Status: DC | PRN

## 2017-02-01 MED ORDER — VERAPAMIL HCL 2.5 MG/ML IV SOLN
INTRA_ARTERIAL | Status: DC | PRN
  Administered 2017-02-01: 5 mL via INTRA_ARTERIAL

## 2017-02-01 MED ORDER — METFORMIN HCL 1000 MG PO TABS: 1000.0000 mg | ORAL_TABLET | Freq: Two times a day (BID) | ORAL | Status: DC

## 2017-02-01 SURGICAL SUPPLY — 13 items
CATH EXPO 5FR ANG PIGTAIL 145 (CATHETERS) ×2 IMPLANT
CATH INFINITI JR4 5F (CATHETERS) ×2 IMPLANT
CATH OPTITORQUE TIG 4.5 5F (CATHETERS) ×2 IMPLANT
DEVICE RAD COMP TR BAND LRG (VASCULAR PRODUCTS) ×2 IMPLANT
GLIDESHEATH SLEND A-KIT 6F 22G (SHEATH) ×2 IMPLANT
GUIDEWIRE INQWIRE 1.5J.035X260 (WIRE) ×1 IMPLANT
INQWIRE 1.5J .035X260CM (WIRE) ×2
KIT HEART LEFT (KITS) ×2 IMPLANT
PACK CARDIAC CATHETERIZATION (CUSTOM PROCEDURE TRAY) ×2 IMPLANT
SYR MEDRAD MARK V 150ML (SYRINGE) ×2 IMPLANT
TRANSDUCER W/STOPCOCK (MISCELLANEOUS) ×2 IMPLANT
TUBING CIL FLEX 10 FLL-RA (TUBING) ×2 IMPLANT
WIRE HI TORQ VERSACORE-J 145CM (WIRE) ×2 IMPLANT

## 2017-02-01 NOTE — Progress Notes (Signed)
Compression band has been fully deflated as ordered. Band removed and gauze and Tegaderm applied to the right radial cath site. Level zero.

## 2017-02-01 NOTE — ED Notes (Signed)
Critical Lab:  Troponin 0.10

## 2017-02-01 NOTE — Progress Notes (Signed)
ANTICOAGULATION CONSULT NOTE - Initial Consult  Pharmacy Consult for heparin Indication: chest pain/ACS  Allergies  Allergen Reactions  . Hydrocodone     Weird sensation  . Methocarbamol Hives  . Oxycodone     Weird sensation  . Latex Rash    Patient Measurements: Height: 5\' 8"  (172.7 cm) Weight: 282 lb (127.9 kg) IBW/kg (Calculated) : 63.9 Heparin Dosing Weight: 94kg  Vital Signs: BP: 109/72 (04/16 0815) Pulse Rate: 76 (04/16 0815)  Labs:  Recent Labs  02/01/17 0424 02/01/17 0430  HGB 12.2 12.9  HCT 37.9 38.0  PLT 346  --   CREATININE 0.93 0.80    Estimated Creatinine Clearance: 95.1 mL/min (by C-G formula based on SCr of 0.8 mg/dL).   Medical History: Past Medical History:  Diagnosis Date  . Anxiety    takes Citalopram daily  . Arthritis    generalized  . Basal cell carcinoma   . GERD (gastroesophageal reflux disease)    takes Omeprazole daily  . History of Bell's palsy    left side  . History of bronchitis   . History of hiatal hernia   . History of kidney stones   . Hyperlipemia    takes Simvastatin daily  . Hypertension    takes Enalapril daily  . Hypothyroidism   . Incontinence    takes Ditropan daily  . Joint swelling    left knee  . Neuropathy    takes Gabapentin daily  . PONV (postoperative nausea and vomiting)   . RLS (restless legs syndrome)   . Seasonal allergies    takes Claritin daioy as needed  . Sleep apnea    pt. states that she does not have a CPAP  . Tremor   . Type 2 diabetes mellitus (HCC)    takes Metformin and Amaryl daily    Medications:  Infusions:  . sodium chloride    . sodium chloride    . heparin      Assessment: 22 yof presented to the ED with CP. Troponin elevated at 0.12. To start IV heparin. Baseline CBC is WNL. Pt had been on xarelto in the past but this was stopped.   Goal of Therapy:  Heparin level 0.3-0.7 units/ml Monitor platelets by anticoagulation protocol: Yes   Plan:  Heparin bolus  4000 units IV x 1 Heparin gtt 1200 units/hr Check a 6 hr heparin level Daily heparin level and CBC  Nyxon Strupp, Rande Lawman 02/01/2017,8:53 AM

## 2017-02-01 NOTE — ED Provider Notes (Signed)
[facility] DEPT Provider Note   CSN: [id number] Arrival date & time: 02/01/17  0341  By signing my name below, I, Higinio Plan, attest that this documentation has been prepared under the direction and in the presence of Kemari Narez, MD . Electronically Signed: Higinio Plan, Scribe. 02/01/2017. 4:05 AM.  History   Chief Complaint Chief Complaint  Patient presents with  . Chest Pain  . Irregular Heart Beat   The history is provided by the patient. No language interpreter was used.  Chest Pain   This is a new problem. The current episode started less than 1 hour ago. The problem occurs constantly. The problem has been gradually worsening. The pain is at a severity of 10/10. The pain radiates to the upper back and left arm. Associated symptoms include diaphoresis, palpitations and weakness.   HPI Comments: LAM BJORKLUND is a 66 y.o. female with PMHx of DM2 and basal cell carcinoma, brought in by EMS to the Emergency Department complaining of gradually worsening, chest pain radiating into her back, left arm, and right leg that woke her up from sleep this morning. Pt describes her pain as an "elephant sitting on her chest." Per EMS, pt was found to be diaphoretic, weak, pale, and experiencing severe chest pain upon their arrival. EMS notes pt's heart rhythm was erratic and with large amounts of ectopy. EMS administered 2 SL 0.4 NTG and 324 mg ASA en route to the ED.  Pt reports her last stress test was ~4 years ago and her last cardiac catheterization was over 5 years ago which was normal. She denies any recent changes to her diet or other complaints.   Past Medical History:  Diagnosis Date  . Anxiety    takes Citalopram daily  . Arthritis    generalized  . Basal cell carcinoma   . GERD (gastroesophageal reflux disease)    takes Omeprazole daily  . History of Bell's palsy    left side  . History of bronchitis   . History of hiatal hernia   . History of kidney stones   . Hyperlipemia    takes Simvastatin daily  . Hypertension    takes Enalapril daily  . Hypothyroidism   . Incontinence    takes Ditropan daily  . Joint swelling    left knee  . Neuropathy (HCC)    takes Gabapentin daily  . PONV (postoperative nausea and vomiting)   . RLS (restless legs syndrome)   . Seasonal allergies    takes Claritin daioy as needed  . Sleep apnea    pt. states that she does not have a CPAP  . Tremor   . Type 2 diabetes mellitus (HCC)    takes Metformin and Amaryl daily    Patient Active Problem List   Diagnosis Date Noted  . Arthritis of knee 04/08/2015  . Primary osteoarthritis of left knee 04/07/2015    Past Surgical History:  Procedure Laterality Date  . ACHILLES TENDON REPAIR Right   . APPENDECTOMY    . BASAL CELL CARCINOMA EXCISION     Nose  . BREAST LUMPECTOMY    . CARDIAC CATHETERIZATION     pt. states approximately 15 years ago  . CARPAL TUNNEL RELEASE Bilateral   . DILATION AND CURETTAGE OF UTERUS    . EYE SURGERY Bilateral    cataract surgery  . KNEE ARTHROSCOPY     Right  . LITHOTRIPSY  2013  . TOTAL ABDOMINAL HYSTERECTOMY    . TOTAL KNEE ARTHROPLASTY Left  04/08/2015   Procedure: LEFT TOTAL KNEE ARTHROPLASTY;  Surgeon: Frederik Pear, MD;  Location: [facility];  Service: Orthopedics;  Laterality: Left;    OB History    No data available     Home Medications    Prior to Admission medications   Medication Sig Start Date End Date Taking? Authorizing Provider  aspirin EC 81 MG tablet Take 1 tablet (81 mg total) by mouth daily. 04/16/16   Jerline Pain, MD  citalopram (CELEXA) 20 MG tablet Take 20 mg by mouth daily.    Historical Provider, MD  enalapril (VASOTEC) 10 MG tablet Take 10 mg by mouth at bedtime.     Historical Provider, MD  glimepiride (AMARYL) 1 MG tablet Take 1 mg by mouth daily with breakfast.    Historical Provider, MD  metFORMIN (GLUCOPHAGE) 1000 MG tablet Take 1,000 mg by mouth 2 (two) times daily with a meal.    Historical Provider, MD    omeprazole (PRILOSEC) 40 MG capsule Take 40 mg by mouth daily.    Historical Provider, MD  oxybutynin (DITROPAN-XL) 10 MG 24 hr tablet Take 10 mg by mouth daily.     Historical Provider, MD  simvastatin (ZOCOR) 20 MG tablet Take 20 mg by mouth daily at 6 PM.    Historical Provider, MD    Family History Family History  Problem Relation Age of Onset  . Heart disease Mother   . Heart disease Father     Social History Social History  Substance Use Topics  . Smoking status: Never Smoker  . Smokeless tobacco: Never Used  . Alcohol use 0.0 oz/week     Comment: Rare-wine   Allergies   Hydrocodone; Methocarbamol; Oxycodone; and Latex  Review of Systems Review of Systems  Constitutional: Positive for diaphoresis. Negative for appetite change.  Cardiovascular: Positive for chest pain and palpitations.  Musculoskeletal: Positive for arthralgias.  Neurological: Positive for weakness.  All other systems reviewed and are negative.  Physical Exam Updated Vital Signs BP (!) 151/68   Pulse 77   Resp 13   Ht 5\' 8"  (1.727 m)   Wt 282 lb (127.9 kg)   SpO2 97%   BMI 42.88 kg/m   Physical Exam  Constitutional: She appears well-developed and well-nourished.  HENT:  Head: Normocephalic.  Mouth/Throat: Oropharynx is clear and moist. No oropharyngeal exudate.  Eyes: Conjunctivae and EOM are normal. Pupils are equal, round, and reactive to light. Right eye exhibits no discharge. Left eye exhibits no discharge. No scleral icterus.  Neck: Normal range of motion. Neck supple. No JVD present. No tracheal deviation present.  Trachea is midline. No stridor or carotid bruits. No JVD.   Cardiovascular: Normal rate, regular rhythm, normal heart sounds and intact distal pulses.   No murmur heard. Pulmonary/Chest: Effort normal and breath sounds normal. No stridor. No respiratory distress. She has no wheezes. She has no rales.  Lungs CTA bilaterally.  Abdominal: Soft. She exhibits no distension.  There is no tenderness. There is no rebound and no guarding.  Hyperactive bowel sounds   Musculoskeletal: Normal range of motion. She exhibits no edema or tenderness.  All compartments are soft. No palpable cords.   Lymphadenopathy:    She has no cervical adenopathy.  Neurological: She is alert. She has normal reflexes. She displays normal reflexes. She exhibits normal muscle tone.  Skin: Skin is warm and dry. Capillary refill takes less than 2 seconds.  Psychiatric: She has a normal mood and affect. Her behavior is normal.  Nursing note and vitals reviewed.  ED Treatments / Results  DIAGNOSTIC STUDIES:  Oxygen Saturation is 97% on RA, normal by my interpretation.    COORDINATION OF CARE: 3:58 AM Discussed treatment plan with pt at bedside and pt agreed to plan.  Labs (all labs ordered are listed, but only abnormal results are displayed)  Results for orders placed or performed during the hospital encounter of 88/50/27  Basic metabolic panel  Result Value Ref Range   Sodium 138 135 - 145 mmol/L   Potassium 3.7 3.5 - 5.1 mmol/L   Chloride 102 101 - 111 mmol/L   CO2 23 22 - 32 mmol/L   Glucose, Bld 135 (H) 65 - 99 mg/dL   BUN 10 6 - 20 mg/dL   Creatinine, Ser 0.93 0.44 - 1.00 mg/dL   Calcium 9.1 8.9 - 10.3 mg/dL   GFR calc non Af Amer >60 >60 mL/min   GFR calc Af Amer >60 >60 mL/min   Anion gap 13 5 - 15  CBC  Result Value Ref Range   WBC 9.0 4.0 - 10.5 K/uL   RBC 4.35 3.87 - 5.11 MIL/uL   Hemoglobin 12.2 12.0 - 15.0 g/dL   HCT 37.9 36.0 - 46.0 %   MCV 87.1 78.0 - 100.0 fL   MCH 28.0 26.0 - 34.0 pg   MCHC 32.2 30.0 - 36.0 g/dL   RDW 15.7 (H) 11.5 - 15.5 %   Platelets 346 150 - 400 K/uL  I-stat troponin, ED  Result Value Ref Range   Troponin i, poc 0.12 (HH) 0.00 - 0.08 ng/mL   Comment NOTIFIED PHYSICIAN    Comment 3          I-Stat Chem 8, ED  Result Value Ref Range   Sodium 138 135 - 145 mmol/L   Potassium 3.7 3.5 - 5.1 mmol/L   Chloride 105 101 - 111 mmol/L    BUN 11 6 - 20 mg/dL   Creatinine, Ser 0.80 0.44 - 1.00 mg/dL   Glucose, Bld 139 (H) 65 - 99 mg/dL   Calcium, Ion 1.06 (L) 1.15 - 1.40 mmol/L   TCO2 24 0 - 100 mmol/L   Hemoglobin 12.9 12.0 - 15.0 g/dL   HCT 38.0 36.0 - 46.0 %   Dg Chest 2 View  Result Date: 02/01/2017 CLINICAL DATA:  Woke up with left-sided chest pain tonight. History of atrial fibrillation. EXAM: CHEST  2 VIEW COMPARISON:  03/28/2015 FINDINGS: Normal heart size and pulmonary vascularity. No focal airspace disease or consolidation in the lungs. No blunting of costophrenic angles. No pneumothorax. Mediastinal contours appear intact. Calcified and tortuous aorta. Degenerative changes in the spine. IMPRESSION: No active cardiopulmonary disease. Electronically Signed   By: Lucienne Capers M.D.   On: 02/01/2017 04:16   Ct Angio Chest/abd/pel For Dissection W And/or Wo Contrast  Result Date: 02/01/2017 CLINICAL DATA:  Pt having left sided chest pain that radiates to her back, numbness left arm, right leg had charley horse. Symptoms started at 0230 this AM. EXAM: CT ANGIOGRAPHY CHEST, ABDOMEN AND PELVIS TECHNIQUE: Multidetector CT imaging through the chest, abdomen and pelvis was performed using the standard protocol during bolus administration of intravenous contrast. Multiplanar reconstructed images and MIPs were obtained and reviewed to evaluate the vascular anatomy. CONTRAST:  100 mL Isovue 370 COMPARISON:  None. FINDINGS: CTA CHEST FINDINGS Cardiovascular: Noncontrast CT images of the chest demonstrate scattered calcification in the aorta. No evidence of intramural hematoma. Central pulmonary arteries are well opacified without evidence of  significant pulmonary embolus. Contrast-enhanced images obtained during the arterial phase demonstrate normal caliber thoracic aorta. No evidence of aortic aneurysm or dissection. Great vessel origins are patent. Normal heart size. No pericardial effusion. Mediastinum/Nodes: Esophagus is decompressed.  No significant lymphadenopathy in the chest. Calcifications in the thyroid gland without significant enlargement. Lungs/Pleura: Evaluation is limited due to motion artifact. No focal airspace disease or consolidation is identified. Mild dependent changes in the lung bases. No pleural effusions. No pneumothorax. Airways are patent. Musculoskeletal: Normal alignment of the thoracic spine. Mild degenerative changes. Sternum and ribs appear intact. Review of the MIP images confirms the above findings. CTA ABDOMEN AND PELVIS FINDINGS VASCULAR Aorta: Normal caliber aorta without aneurysm, dissection, vasculitis or significant stenosis. Scattered calcifications. Celiac: Patent without evidence of aneurysm, dissection, vasculitis or significant stenosis. SMA: Patent without evidence of aneurysm, dissection, vasculitis or significant stenosis. Renals: Single right and duplicated left renal arteries are patent. Renal nephrograms are symmetrical. IMA: Diminutive but appears patent. Inflow: Patent without evidence of aneurysm, dissection, vasculitis or significant stenosis. Veins: No obvious venous abnormality within the limitations of this arterial phase study. Review of the MIP images confirms the above findings. NON-VASCULAR Hepatobiliary: Circumscribed low-attenuation lesion in the lateral segment left lobe of the liver measuring 3.6 cm diameter. Characterization is somewhat limited in the arterial phase but this appears to represent a simple cyst. Gallbladder and bile ducts are unremarkable. Pancreas: Unremarkable. No pancreatic ductal dilatation or surrounding inflammatory changes. Spleen: Normal in size without focal abnormality. Adrenals/Urinary Tract: Adrenal glands are unremarkable. Kidneys are normal, without renal calculi, focal lesion, or hydronephrosis. Bladder is unremarkable. Stomach/Bowel: Stomach is within normal limits. Appendix is not identified. No evidence of bowel wall thickening, distention, or  inflammatory changes. Lymphatic: No significant lymphadenopathy. Reproductive: Status post hysterectomy. No adnexal masses. Other: Periumbilical hernia containing fat. No free air or free fluid in the abdomen. Musculoskeletal: Degenerative changes in the lumbar spine. No destructive bone lesions. Review of the MIP images confirms the above findings. IMPRESSION: No evidence of dissection or aneurysm of the thoracic or abdominal aorta. Major vessels are patent. No acute process demonstrated in the chest abdomen or pelvis. Benign-appearing cyst in the liver. Small periumbilical hernia containing fat. Electronically Signed   By: Lucienne Capers M.D.   On: 02/01/2017 05:44      EKG Interpretation  Date/Time:  Monday Davidjames Blansett 16 2018 03:48:01 EDT Ventricular Rate:  79 PR Interval:    QRS Duration: 90 QT Interval:  399 QTC Calculation: 458 R Axis:   -11 Text Interpretation:  Sinus arrhythmia Nonspecific T abnormalities, lateral leads Confirmed by [facility]  MD, Moya Duan ([id number]) on 02/01/2017 3:52:29 AM       Procedures Procedures (including critical care time)  Medications Ordered in ED  Medications  aspirin chewable tablet 81 mg (not administered)  nitroGLYCERIN (NITROGLYN) 2 % ointment 0.5 inch (not administered)  iopamidol (ISOVUE-370) 76 % injection (100 mLs  Contrast Given 02/01/17 0504)       Final Clinical Impressions(s) / ED Diagnoses  Unstable angina:  Ruled out for dissection in the ED.  Case d/w Dr. Stanford Breed who will see the patient   I personally performed the services described in this documentation, which was scribed in my presence. The recorded information has been reviewed and is accurate.      Veatrice Kells, MD 02/01/17 [phone]

## 2017-02-01 NOTE — Discharge Summary (Signed)
Discharge Summary    Patient ID: Linda Washington,  MRN: [id number], DOB/AGE: [birth year] 66 y.o.  Admit date: 02/01/2017 Discharge date: 02/01/2017   Primary Care Provider: Marda Stalker Primary Cardiologist: Dr. Marlou Porch  Discharge Diagnoses    Principal Problem:   Unstable angina [facility]) Active Problems:   Diabetes mellitus ([place])   Allergies Allergies  Allergen Reactions  . Hydrocodone     Weird sensation  . Methocarbamol Hives  . Oxycodone     Weird sensation  . Latex Rash     History of Present Illness     Linda Washington is a 66 y.o. female with past medical history of post-operative atrial flutter (previously on Xarelto), Type 2 DM, HTN, HLD and OSA (unable to tolerate CPAP) who presents to [facility] ED on 02/01/2017 for evaluation of chest pain.   Was seen by Dr. Marlou Porch in 03/2016 and reported doing well from a cardiac perspective at that time. Due to her most recent EKG's showing sinus rhythm, Xarelto was stopped as her initial episode occurred in the post-operative setting.   In talking with the patient, she reports being in her usual state of health until 0230 this AM when a new-onset chest pressure awoke her from sleep. She says it felt like a car was sitting on her chest. The pain radiated to her back and down her left arm. She reports associated dyspnea. Denies any diaphoresis, nausea, or vomiting. Had been able to perform yard work the day prior without any anginal symptoms at that time.   Denies any recent orthopnea, PND, lower extremity edema, palpitations, or presyncope.    Initial BP upon EMS arrival was 264/108. At 151/68 upon arrival to the ED, now at 129/65. She was given 2 SL NTG en route with resolution of her chest pain. Still reports a mild pain along her shoulder blades bilaterally.   Initial labs show WBC of 9.0, Hgb 12.2, and platelets 346. K+ 3.7, creatinine 0.93. Initial troponin at 0.12. CXR showing no active cardiopulmonary disease. CTA with  no evidence of dissection or aneurysm. EKG with sinus rhythm, HR 79, PAC's, and no acute ST or T-wave changes when compared to prior tracings.   She reports having a cardiac catheterization 10+ years ago and says a 20% lesion was noted at that time but no ischemic evaluation in the interim.   Hospital Course     Consultants: None  Patient underwent left heart catheterization via right radial artery.Marland Kitchen She was found to have no non-obstructive disease; no intervention needed. She recovered well from the procedure and has ambulated in the halls.  CTA was negative for PE or other acute processes.   She was discharged on all home meds, no new medications. She was instructed to hold metformin for 48 hrs.   Patient seen and examined by Dr. Stanford Breed today and was stable for discharge. The office will call her with a follow up appt.  _____________  Discharge Vitals Blood pressure 106/84, pulse 65, temperature 98.9 F (37.2 C), temperature source Oral, resp. rate 14, height 5\' 8"  (1.727 m), weight 282 lb (127.9 kg), SpO2 98 %.  Filed Weights   02/01/17 0349  Weight: 282 lb (127.9 kg)    Labs & Radiologic Studies    CBC  Recent Labs  02/01/17 0424 02/01/17 0430  WBC 9.0  --   HGB 12.2 12.9  HCT 37.9 38.0  MCV 87.1  --   PLT 346  --    Basic  Metabolic Panel  Recent Labs  02/01/17 0424 02/01/17 0430  NA 138 138  K 3.7 3.7  CL 102 105  CO2 23  --   GLUCOSE 135* 139*  BUN 10 11  CREATININE 0.93 0.80  CALCIUM 9.1  --    Liver Function Tests No results for input(s): AST, ALT, ALKPHOS, BILITOT, PROT, ALBUMIN in the last 72 hours. No results for input(s): LIPASE, AMYLASE in the last 72 hours. Cardiac Enzymes  Recent Labs  02/01/17 0917 02/01/17 1626  TROPONINI 0.10* 0.11*   BNP Invalid input(s): POCBNP D-Dimer No results for input(s): DDIMER in the last 72 hours. Hemoglobin A1C No results for input(s): HGBA1C in the last 72 hours. Fasting Lipid Panel No results  for input(s): CHOL, HDL, LDLCALC, TRIG, CHOLHDL, LDLDIRECT in the last 72 hours. Thyroid Function Tests No results for input(s): TSH, T4TOTAL, T3FREE, THYROIDAB in the last 72 hours.  Invalid input(s): FREET3 _____________  Dg Chest 2 View  Result Date: 02/01/2017 CLINICAL DATA:  Woke up with left-sided chest pain tonight. History of atrial fibrillation. EXAM: CHEST  2 VIEW COMPARISON:  03/28/2015 FINDINGS: Normal heart size and pulmonary vascularity. No focal airspace disease or consolidation in the lungs. No blunting of costophrenic angles. No pneumothorax. Mediastinal contours appear intact. Calcified and tortuous aorta. Degenerative changes in the spine. IMPRESSION: No active cardiopulmonary disease. Electronically Signed   By: Lucienne Capers M.D.   On: 02/01/2017 04:16   Ct Angio Chest/abd/pel For Dissection W And/or Wo Contrast  Result Date: 02/01/2017 CLINICAL DATA:  Pt having left sided chest pain that radiates to her back, numbness left arm, right leg had charley horse. Symptoms started at 0230 this AM. EXAM: CT ANGIOGRAPHY CHEST, ABDOMEN AND PELVIS TECHNIQUE: Multidetector CT imaging through the chest, abdomen and pelvis was performed using the standard protocol during bolus administration of intravenous contrast. Multiplanar reconstructed images and MIPs were obtained and reviewed to evaluate the vascular anatomy. CONTRAST:  100 mL Isovue 370 COMPARISON:  None. FINDINGS: CTA CHEST FINDINGS Cardiovascular: Noncontrast CT images of the chest demonstrate scattered calcification in the aorta. No evidence of intramural hematoma. Central pulmonary arteries are well opacified without evidence of significant pulmonary embolus. Contrast-enhanced images obtained during the arterial phase demonstrate normal caliber thoracic aorta. No evidence of aortic aneurysm or dissection. Great vessel origins are patent. Normal heart size. No pericardial effusion. Mediastinum/Nodes: Esophagus is decompressed. No  significant lymphadenopathy in the chest. Calcifications in the thyroid gland without significant enlargement. Lungs/Pleura: Evaluation is limited due to motion artifact. No focal airspace disease or consolidation is identified. Mild dependent changes in the lung bases. No pleural effusions. No pneumothorax. Airways are patent. Musculoskeletal: Normal alignment of the thoracic spine. Mild degenerative changes. Sternum and ribs appear intact. Review of the MIP images confirms the above findings. CTA ABDOMEN AND PELVIS FINDINGS VASCULAR Aorta: Normal caliber aorta without aneurysm, dissection, vasculitis or significant stenosis. Scattered calcifications. Celiac: Patent without evidence of aneurysm, dissection, vasculitis or significant stenosis. SMA: Patent without evidence of aneurysm, dissection, vasculitis or significant stenosis. Renals: Single right and duplicated left renal arteries are patent. Renal nephrograms are symmetrical. IMA: Diminutive but appears patent. Inflow: Patent without evidence of aneurysm, dissection, vasculitis or significant stenosis. Veins: No obvious venous abnormality within the limitations of this arterial phase study. Review of the MIP images confirms the above findings. NON-VASCULAR Hepatobiliary: Circumscribed low-attenuation lesion in the lateral segment left lobe of the liver measuring 3.6 cm diameter. Characterization is somewhat limited in the arterial phase but this  appears to represent a simple cyst. Gallbladder and bile ducts are unremarkable. Pancreas: Unremarkable. No pancreatic ductal dilatation or surrounding inflammatory changes. Spleen: Normal in size without focal abnormality. Adrenals/Urinary Tract: Adrenal glands are unremarkable. Kidneys are normal, without renal calculi, focal lesion, or hydronephrosis. Bladder is unremarkable. Stomach/Bowel: Stomach is within normal limits. Appendix is not identified. No evidence of bowel wall thickening, distention, or inflammatory  changes. Lymphatic: No significant lymphadenopathy. Reproductive: Status post hysterectomy. No adnexal masses. Other: Periumbilical hernia containing fat. No free air or free fluid in the abdomen. Musculoskeletal: Degenerative changes in the lumbar spine. No destructive bone lesions. Review of the MIP images confirms the above findings. IMPRESSION: No evidence of dissection or aneurysm of the thoracic or abdominal aorta. Major vessels are patent. No acute process demonstrated in the chest abdomen or pelvis. Benign-appearing cyst in the liver. Small periumbilical hernia containing fat. Electronically Signed   By: Lucienne Capers M.D.   On: 02/01/2017 05:44     Diagnostic Studies/Procedures    Left Heart Catheterization 02/01/17: Ms. Foland has normal coronary arteries and normal LV function. I believe her chest pain is noncardiac. The sheath was removed and a TR band was placed on the right wrist to achieve patent hemostasis. The patient left the lab in stable condition. She'll be discharged home later today with outpatient follow-up.   EKG:  Sinus rhythm, HR 79, PAC's, and no acute ST or T-wave changes when compared to prior tracings.- Personally Reviewed   ECHOCARDIOGRAM: 03/2015 Study Conclusions  - Left ventricle: The cavity size was normal. Systolic function was normal. The estimated ejection fraction was in the range of 55% to 60%. Wall motion was normal; there were no regional wall motion abnormalities. - Aortic valve: There was mild regurgitation. - Mitral valve: There was trivial regurgitation. - Left atrium: The atrium was moderately dilated. - Atrial septum: A patent foramen ovale cannot be excluded. - Tricuspid valve: There was trivial regurgitation. - Recommendations: Consider agitated saline contrast study to rule out PFO.   Disposition   Pt is being discharged home today in good condition.  Follow-up Plans & Appointments       Discharge Medications    Current Discharge Medication List    CONTINUE these medications which have NOT CHANGED   Details  aspirin EC 81 MG tablet Take 1 tablet (81 mg total) by mouth daily. Qty: 90 tablet, Refills: 3    atorvastatin (LIPITOR) 40 MG tablet Take 40 mg by mouth daily.    citalopram (CELEXA) 20 MG tablet Take 20 mg by mouth daily.    empagliflozin (JARDIANCE) 25 MG TABS tablet Take 25 mg by mouth daily.    enalapril (VASOTEC) 10 MG tablet Take 10 mg by mouth at bedtime.     fluticasone (FLONASE) 50 MCG/ACT nasal spray Place 1-2 sprays into both nostrils daily as needed for allergies or rhinitis.    liraglutide (VICTOZA) 18 MG/3ML SOPN Inject 1.8 mg into the skin every morning.    metFORMIN (GLUCOPHAGE) 1000 MG tablet Take 1,000 mg by mouth 2 (two) times daily with a meal.    omeprazole (PRILOSEC) 40 MG capsule Take 20 mg by mouth daily.     oxybutynin (DITROPAN-XL) 10 MG 24 hr tablet Take 15 mg by mouth daily.     Polyethyl Glycol-Propyl Glycol (SYSTANE OP) Apply 1-2 drops to eye daily as needed (dry eyes).         Outstanding Labs/Studies   None  Office will call patient with follow up  appt.   Duration of Discharge Encounter   Greater than 30 minutes including physician time.  Signed, Tami Lin Massie Mees PA-C 02/01/2017, 6:40 PM

## 2017-02-01 NOTE — Interval H&P Note (Signed)
Cath Lab Visit (complete for each Cath Lab visit)  Clinical Evaluation Leading to the Procedure:   ACS: Yes.    Non-ACS:    Anginal Classification: CCS III  Anti-ischemic medical therapy: No Therapy  Non-Invasive Test Results: No non-invasive testing performed  Prior CABG: No previous CABG      History and Physical Interval Note:  02/01/2017 1:38 PM  Linda Washington  has presented today for surgery, with the diagnosis of cp  The various methods of treatment have been discussed with the patient and family. After consideration of risks, benefits and other options for treatment, the patient has consented to  Procedure(s): Left Heart Cath and Coronary Angiography (N/A) as a surgical intervention .  The patient's history has been reviewed, patient examined, no change in status, stable for surgery.  I have reviewed the patient's chart and labs.  Questions were answered to the patient's satisfaction.     Quay Burow

## 2017-02-01 NOTE — Progress Notes (Signed)
D/c instructions discussed with pt and she verbalized understanding. Pt d/c home via a personal vehicle.

## 2017-02-01 NOTE — ED Notes (Signed)
Attempted to call report

## 2017-02-01 NOTE — ED Triage Notes (Signed)
Patient arrives from home via EMS. Called EMS this AM for sudden onset chest pain which woke her from sleep. EMS found patient diaphoretic, weak, pale, and with severe chest pain. Initial BP was 264/108, CP was 10/10. Also complained of pain in back and lower extremities at that time. Patient's heart rhythm was erratic and with large amounts of ectopy. HRs variable between 85 and 130. EMS administered 2 SL 0.4 NTG. Patient took 2 ASA 81mg  herself. FD administered additional 324mg  ASA upon their arrival.

## 2017-02-01 NOTE — H&P (Signed)
History & Physical    Patient ID: Linda Washington MRN: [id number], DOB/AGE: Feb 18, 1951   Admit date: 02/01/2017   Primary Physician: Marda Stalker, PA-C Primary Cardiologist: Dr. Marlou Porch   History of Present Illness    Linda Washington is a 66 y.o. female with past medical history of post-operative atrial flutter (previously on Xarelto), Type 2 DM, HTN, HLD and OSA (unable to tolerate CPAP) who presents to [facility] ED on 02/01/2017 for evaluation of chest pain.   Was seen by Dr. Marlou Porch in 03/2016 and reported doing well from a cardiac perspective at that time. Due to her most recent EKG's showing sinus rhythm, Xarelto was stopped as her initial episode occurred in the post-operative setting.   In talking with the patient, she reports being in her usual state of health until 0230 this AM when a new-onset chest pressure awoke her from sleep. She says it felt like a car was sitting on her chest. The pain radiated to her back and down her left arm. She reports associated dyspnea. Denies any diaphoresis, nausea, or vomiting. Had been able to perform yard work the day prior without any anginal symptoms at that time.   Denies any recent orthopnea, PND, lower extremity edema, palpitations, or presyncope.    Initial BP upon EMS arrival was 264/108. At 151/68 upon arrival to the ED, now at 129/65. She was given 2 SL NTG en route with resolution of her chest pain. Still reports a mild pain along her shoulder blades bilaterally.   Initial labs show WBC of 9.0, Hgb 12.2, and platelets 346. K+ 3.7, creatinine 0.93. Initial troponin at 0.12. CXR showing no active cardiopulmonary disease. CTA with no evidence of dissection or aneurysm. EKG with sinus rhythm, HR 79, PAC's, and no acute ST or T-wave changes when compared to prior tracings.   She reports having a cardiac catheterization 10+ years ago and says a 20% lesion was noted at that time but no ischemic evaluation in the interim.   Past Medical  History    Past Medical History:  Diagnosis Date  . Anxiety    takes Citalopram daily  . Arthritis    generalized  . Basal cell carcinoma   . GERD (gastroesophageal reflux disease)    takes Omeprazole daily  . History of Bell's palsy    left side  . History of bronchitis   . History of hiatal hernia   . History of kidney stones   . Hyperlipemia    takes Simvastatin daily  . Hypertension    takes Enalapril daily  . Hypothyroidism   . Incontinence    takes Ditropan daily  . Joint swelling    left knee  . Neuropathy    takes Gabapentin daily  . PONV (postoperative nausea and vomiting)   . RLS (restless legs syndrome)   . Seasonal allergies    takes Claritin daioy as needed  . Sleep apnea    pt. states that she does not have a CPAP  . Tremor   . Type 2 diabetes mellitus (HCC)    takes Metformin and Amaryl daily    Past Surgical History:  Procedure Laterality Date  . ACHILLES TENDON REPAIR Right   . APPENDECTOMY    . BASAL CELL CARCINOMA EXCISION     Nose  . BREAST LUMPECTOMY    . CARDIAC CATHETERIZATION     pt. states approximately 15 years ago  . CARPAL TUNNEL RELEASE Bilateral   . DILATION AND CURETTAGE  OF UTERUS    . EYE SURGERY Bilateral    cataract surgery  . KNEE ARTHROSCOPY     Right  . LITHOTRIPSY  2013  . TOTAL ABDOMINAL HYSTERECTOMY    . TOTAL KNEE ARTHROPLASTY Left 04/08/2015   Procedure: LEFT TOTAL KNEE ARTHROPLASTY;  Surgeon: Frederik Pear, MD;  Location: [place];  Service: Orthopedics;  Laterality: Left;     Allergies  Allergies  Allergen Reactions  . Hydrocodone     Weird sensation  . Methocarbamol Hives  . Oxycodone     Weird sensation  . Latex Rash     Home Medications    Prior to Admission medications   Medication Sig Start Date End Date Taking? Authorizing Provider  aspirin EC 81 MG tablet Take 1 tablet (81 mg total) by mouth daily. 04/16/16  Yes Jerline Pain, MD  atorvastatin (LIPITOR) 40 MG tablet Take 40 mg by mouth daily.    Yes Historical Provider, MD  citalopram (CELEXA) 20 MG tablet Take 20 mg by mouth daily.   Yes Historical Provider, MD  empagliflozin (JARDIANCE) 25 MG TABS tablet Take 25 mg by mouth daily.   Yes Historical Provider, MD  enalapril (VASOTEC) 10 MG tablet Take 10 mg by mouth at bedtime.    Yes Historical Provider, MD  fluticasone (FLONASE) 50 MCG/ACT nasal spray Place 1-2 sprays into both nostrils daily as needed for allergies or rhinitis.   Yes Historical Provider, MD  liraglutide (VICTOZA) 18 MG/3ML SOPN Inject 1.8 mg into the skin every morning.   Yes Historical Provider, MD  metFORMIN (GLUCOPHAGE) 1000 MG tablet Take 1,000 mg by mouth 2 (two) times daily with a meal.   Yes Historical Provider, MD  omeprazole (PRILOSEC) 40 MG capsule Take 20 mg by mouth daily.    Yes Historical Provider, MD  oxybutynin (DITROPAN-XL) 10 MG 24 hr tablet Take 15 mg by mouth daily.    Yes Historical Provider, MD  Polyethyl Glycol-Propyl Glycol (SYSTANE OP) Apply 1-2 drops to eye daily as needed (dry eyes).   Yes Historical Provider, MD    Family History    Family History  Problem Relation Age of Onset  . Heart disease Mother   . Heart disease Father     Social History    Social History   Social History  . Marital status: Divorced    Spouse name: N/A  . Number of children: N/A  . Years of education: Some colle   Occupational History  . Retired    Social History Main Topics  . Smoking status: Never Smoker  . Smokeless tobacco: Never Used  . Alcohol use 0.0 oz/week     Comment: Rare-wine  . Drug use: No  . Sexual activity: Not on file   Other Topics Concern  . Not on file   Social History Narrative   Lives at home with Lonn Georgia, roommate   Caffeine use: 1 Coffee day, 1-2 a day of tea, 1-2 sodas a day      Review of Systems    General:  No chills, fever, night sweats or weight changes.  Cardiovascular:  No edema, orthopnea, palpitations, paroxysmal nocturnal dyspnea. Positive for  chest pain and dyspnea.  Dermatological: No rash, lesions/masses Respiratory: No cough, Positive for dyspnea Urologic: No hematuria, dysuria Abdominal:   No nausea, vomiting, diarrhea, bright red blood per rectum, melena, or hematemesis Neurologic:  No visual changes, wkns, changes in mental status. All other systems reviewed and are otherwise negative except as noted above.  Physical Exam    Blood pressure 129/65, pulse 75, resp. rate 19, height 5\' 8"  (1.727 m), weight 282 lb (127.9 kg), SpO2 100 %.  General: Well developed, obese Caucasian female appearing in no acute distress. Head: Normocephalic, atraumatic, sclera non-icteric, no xanthomas, nares are without discharge. Dentition:  Neck: No carotid bruits. JVD not elevated.  Lungs: Respirations regular and unlabored, without wheezes or rales.  Heart: Regular rate and rhythm. No S3 or S4.  No murmur, no rubs, or gallops appreciated. Abdomen: Soft, non-tender, non-distended with normoactive bowel sounds. No hepatomegaly. No rebound/guarding. No obvious abdominal masses. Msk:  Strength and tone appear normal for age. No joint deformities or effusions. Extremities: No clubbing or cyanosis. No lower extremity edema.  Distal pedal pulses are 2+ bilaterally. Neuro: Alert and oriented X 3. Moves all extremities spontaneously. No focal deficits noted. Psych:  Responds to questions appropriately with a normal affect. Skin: No rashes or lesions noted  Labs    Troponin (Point of Care Test)  Recent Labs  02/01/17 0428  TROPIPOC 0.12*   No results for input(s): CKTOTAL, CKMB, TROPONINI in the last 72 hours. Lab Results  Component Value Date   WBC 9.0 02/01/2017   HGB 12.9 02/01/2017   HCT 38.0 02/01/2017   MCV 87.1 02/01/2017   PLT 346 02/01/2017    Recent Labs Lab 02/01/17 0424 02/01/17 0430  NA 138 138  K 3.7 3.7  CL 102 105  CO2 23  --   BUN 10 11  CREATININE 0.93 0.80  CALCIUM 9.1  --   GLUCOSE 135* 139*   No results  found for: CHOL, HDL, LDLCALC, TRIG No results found for: DDIMER  No results found for: BNP No results found for: PROBNP No results for input(s): INR in the last 72 hours.   Radiology Studies    Dg Chest 2 View  Result Date: 02/01/2017 CLINICAL DATA:  Woke up with left-sided chest pain tonight. History of atrial fibrillation. EXAM: CHEST  2 VIEW COMPARISON:  03/28/2015 FINDINGS: Normal heart size and pulmonary vascularity. No focal airspace disease or consolidation in the lungs. No blunting of costophrenic angles. No pneumothorax. Mediastinal contours appear intact. Calcified and tortuous aorta. Degenerative changes in the spine. IMPRESSION: No active cardiopulmonary disease. Electronically Signed   By: Lucienne Capers M.D.   On: 02/01/2017 04:16   Ct Angio Chest/abd/pel For Dissection W And/or Wo Contrast  Result Date: 02/01/2017 CLINICAL DATA:  Pt having left sided chest pain that radiates to her back, numbness left arm, right leg had charley horse. Symptoms started at 0230 this AM. EXAM: CT ANGIOGRAPHY CHEST, ABDOMEN AND PELVIS TECHNIQUE: Multidetector CT imaging through the chest, abdomen and pelvis was performed using the standard protocol during bolus administration of intravenous contrast. Multiplanar reconstructed images and MIPs were obtained and reviewed to evaluate the vascular anatomy. CONTRAST:  100 mL Isovue 370 COMPARISON:  None. FINDINGS: CTA CHEST FINDINGS Cardiovascular: Noncontrast CT images of the chest demonstrate scattered calcification in the aorta. No evidence of intramural hematoma. Central pulmonary arteries are well opacified without evidence of significant pulmonary embolus. Contrast-enhanced images obtained during the arterial phase demonstrate normal caliber thoracic aorta. No evidence of aortic aneurysm or dissection. Great vessel origins are patent. Normal heart size. No pericardial effusion. Mediastinum/Nodes: Esophagus is decompressed. No significant lymphadenopathy  in the chest. Calcifications in the thyroid gland without significant enlargement. Lungs/Pleura: Evaluation is limited due to motion artifact. No focal airspace disease or consolidation is identified. Mild dependent changes in the  lung bases. No pleural effusions. No pneumothorax. Airways are patent. Musculoskeletal: Normal alignment of the thoracic spine. Mild degenerative changes. Sternum and ribs appear intact. Review of the MIP images confirms the above findings. CTA ABDOMEN AND PELVIS FINDINGS VASCULAR Aorta: Normal caliber aorta without aneurysm, dissection, vasculitis or significant stenosis. Scattered calcifications. Celiac: Patent without evidence of aneurysm, dissection, vasculitis or significant stenosis. SMA: Patent without evidence of aneurysm, dissection, vasculitis or significant stenosis. Renals: Single right and duplicated left renal arteries are patent. Renal nephrograms are symmetrical. IMA: Diminutive but appears patent. Inflow: Patent without evidence of aneurysm, dissection, vasculitis or significant stenosis. Veins: No obvious venous abnormality within the limitations of this arterial phase study. Review of the MIP images confirms the above findings. NON-VASCULAR Hepatobiliary: Circumscribed low-attenuation lesion in the lateral segment left lobe of the liver measuring 3.6 cm diameter. Characterization is somewhat limited in the arterial phase but this appears to represent a simple cyst. Gallbladder and bile ducts are unremarkable. Pancreas: Unremarkable. No pancreatic ductal dilatation or surrounding inflammatory changes. Spleen: Normal in size without focal abnormality. Adrenals/Urinary Tract: Adrenal glands are unremarkable. Kidneys are normal, without renal calculi, focal lesion, or hydronephrosis. Bladder is unremarkable. Stomach/Bowel: Stomach is within normal limits. Appendix is not identified. No evidence of bowel wall thickening, distention, or inflammatory changes. Lymphatic: No  significant lymphadenopathy. Reproductive: Status post hysterectomy. No adnexal masses. Other: Periumbilical hernia containing fat. No free air or free fluid in the abdomen. Musculoskeletal: Degenerative changes in the lumbar spine. No destructive bone lesions. Review of the MIP images confirms the above findings. IMPRESSION: No evidence of dissection or aneurysm of the thoracic or abdominal aorta. Major vessels are patent. No acute process demonstrated in the chest abdomen or pelvis. Benign-appearing cyst in the liver. Small periumbilical hernia containing fat. Electronically Signed   By: Lucienne Capers M.D.   On: 02/01/2017 05:44    EKG & Cardiac Imaging    EKG:  Sinus rhythm, HR 79, PAC's, and no acute ST or T-wave changes when compared to prior tracings.- Personally Reviewed  ECHOCARDIOGRAM: 03/2015 Study Conclusions  - Left ventricle: The cavity size was normal. Systolic function was   normal. The estimated ejection fraction was in the range of 55%   to 60%. Wall motion was normal; there were no regional wall   motion abnormalities. - Aortic valve: There was mild regurgitation. - Mitral valve: There was trivial regurgitation. - Left atrium: The atrium was moderately dilated. - Atrial septum: A patent foramen ovale cannot be excluded. - Tricuspid valve: There was trivial regurgitation. - Recommendations: Consider agitated saline contrast study to rule   out PFO.   Assessment & Plan    1. Chest Pain/ Elevated Troponin - developed new-onset chest pain at 0230 this morning which awoke her from sleep. Describes it as a car sitting on her chest, with the pain radiating into her back and down her left arm. Resolved with 2 SL NTG.  - Initial troponin at 0.12. EKG with no acute ST or T-wave changes when compared to prior tracings. CTA with no evidence of dissection or aneurysm. - she had a catheterization 10+ years ago in [place] which she reports showed a 20% lesion. - due to her  concerning symptoms and known risk factors of Type 2 DM, HTN, HLD, and family history of CAD (brother had CABG at age 62), will plan for a definitive evaluation with a cardiac catheterization.  - The patient understands that risks included but are not limited to stroke (1  in 1000), death (1 in 40), kidney failure [usually temporary] (1 in 500), bleeding (1 in 200), allergic reaction [possibly serious] (1 in 200). Will plan for cardiac catheterization later today if schedule allows. Start IVF @ 75 mL/hr as she just had a CTA this morning.   2. Type 2 DM - on Metformin, Jardiance, and Victoza as an outpatient.  - SSI while admitted. Hold Metformin for 48 hours following her catheterization.   3. HTN - BP initially at 264/108 by EMS report, at 151/68 upon arrival to the ED. Now well-controlled at 129/65. - resume Enalapril tomorrow.   4. HLD - on Atorvastatin 40mg  daily. Recheck FLP.   5. History of Post-operative Atrial Fibrillation - no known recurrence since 2016. No longer on anticoagulation. - continue to monitor on telemetry.   Signed, Erma Heritage, PA-C 02/01/2017, 7:33 AM Pager: [phone] As above, patient seen and examined. Briefly she is a 66 year old female with past medical history of diabetes mellitus, hypertension, hyperlipidemia, obstructive sleep apnea, postoperative atrial flutter with non-ST elevation myocardial infarction. Patient typically does not have significant dyspnea on exertion, orthopnea, PND, pedal edema, exertional chest pain or syncope. Patient awoke at 2:30 AM with pain under her left breast radiating to her back and left upper extremity. The pain was described as a sharp pain. No associated symptoms. Not pleuritic, positional or related to food. Her blood pressure was also high by her report. Her pain resolved after 30 minutes with nitroglycerin administered by EMS.  CTA in emergency room showed no dissection. Creatinine 0.8. Hemoglobin 12.9. Troponin  0.12. Electrocardiogram showed sinus rhythm, borderline LVH and nonspecific ST changes.  1 non-ST elevation myocardial infarction-patient presents with concerning chest pain and troponin 0.12. Plan aspirin, heparin, beta blockade and statin. Proceed with cardiac catheterization. The risks and benefits including myocardial infarction, CVA and death discussed and she agrees to proceed.  2 hyperlipidemia-continue statin.  3 hypertension-blood pressure improved since arrival. Continue preadmission medications and follow. Will hold ACE inhibitor until after catheterization.  4 diabetes mellitus-sliding scale insulin while in hospital. Resume preadmission regimen following procedure but hold glucophage for 48 hrs.  Kirk Ruths, MD

## 2017-02-02 ENCOUNTER — Encounter (HOSPITAL_COMMUNITY): Payer: Self-pay | Admitting: Cardiovascular Disease

## 2017-02-03 DIAGNOSIS — K219 Gastro-esophageal reflux disease without esophagitis: Secondary | ICD-10-CM | POA: Diagnosis not present

## 2017-02-03 DIAGNOSIS — F418 Other specified anxiety disorders: Secondary | ICD-10-CM | POA: Diagnosis not present

## 2017-02-03 DIAGNOSIS — N3281 Overactive bladder: Secondary | ICD-10-CM | POA: Diagnosis not present

## 2017-02-03 DIAGNOSIS — Z7984 Long term (current) use of oral hypoglycemic drugs: Secondary | ICD-10-CM | POA: Diagnosis not present

## 2017-02-03 DIAGNOSIS — I10 Essential (primary) hypertension: Secondary | ICD-10-CM | POA: Diagnosis not present

## 2017-02-03 DIAGNOSIS — R079 Chest pain, unspecified: Secondary | ICD-10-CM | POA: Diagnosis not present

## 2017-02-03 DIAGNOSIS — E1142 Type 2 diabetes mellitus with diabetic polyneuropathy: Secondary | ICD-10-CM | POA: Diagnosis not present

## 2017-02-03 DIAGNOSIS — E7800 Pure hypercholesterolemia, unspecified: Secondary | ICD-10-CM | POA: Diagnosis not present

## 2017-02-08 NOTE — Progress Notes (Signed)
Cardiology Office Note    Date:  02/11/2017   ID:  [name], DOB [birth year], MRN [id number]  PCP:  Linda Stalker, PA-C  Cardiologist: Dr. Marlou Washington  CC: post hospital follow up   History of Present Illness:  Linda Washington is a 66 y.o. female with a history of morbid obesity,  DMT2, HTN, untreated OS (intolerant to CPAP), post op atrial flutter (knee surgery) who presents to clinic for post hospital follow up.   Was seen by Dr. Marlou Washington in 03/2016 and reported doing well from a cardiac perspective at that time. Due to her most recent EKG's showing sinus rhythm, Xarelto was stopped as her initial episode occurred in the post-operative setting.   She was recently admitted 02/01/17 for chest pain, hypertensive emergency and elevated troponin. She was in her usual state of health until 2:30 am 4/16 when she was woken from sleep with chest pain. Initial BP upon EMS arrival was 264/108. At 151/68 upon arrival to the ED and then improved to 129/65. She was given 2 SL NTG en route with resolution of her chest pain. Initial troponin was 0.12. CTA with no evidence of dissection or aneurysm. EKG with sinus rhythm, HR 79, PAC's, and no acute ST or T-wave changes when compared to prior tracings. Patient underwent left heart catheterization via right radial artery. She was found to have no non-obstructive disease; no intervention needed. She was dischrged that same day.   Today she presents to clinic for follow up. She has been feeling a lot better but still having some GI complaints and working with PCP to rule out gallbladder disease. No more chest pain. Having belching. Increased omeprazole has helped a little.     ROS No SOB. No LE edema, orthopnea or PND. No dizziness or syncope. No blood in stool or urine. No palpitations. She could feel the afib that day perioperatively. She has not felt anything like that.      Past Medical History:  Diagnosis Date  . Anxiety    takes Citalopram daily  .  Arthritis    generalized  . Basal cell carcinoma   . GERD (gastroesophageal reflux disease)    takes Omeprazole daily  . History of Bell's palsy    left side  . History of bronchitis   . History of hiatal hernia   . History of kidney stones   . Hyperlipemia    takes Simvastatin daily  . Hypertension    takes Enalapril daily  . Hypothyroidism   . Incontinence    takes Ditropan daily  . Joint swelling    left knee  . Neuropathy    takes Gabapentin daily  . PONV (postoperative nausea and vomiting)   . RLS (restless legs syndrome)   . Seasonal allergies    takes Claritin daioy as needed  . Sleep apnea    pt. states that she does not have a CPAP  . Tremor   . Type 2 diabetes mellitus (HCC)    takes Metformin and Amaryl daily    Past Surgical History:  Procedure Laterality Date  . ACHILLES TENDON REPAIR Right   . APPENDECTOMY    . BASAL CELL CARCINOMA EXCISION     Nose  . BREAST LUMPECTOMY    . CARDIAC CATHETERIZATION     pt. states approximately 15 years ago  . CARPAL TUNNEL RELEASE Bilateral   . DILATION AND CURETTAGE OF UTERUS    . EYE SURGERY Bilateral    cataract surgery  .  KNEE ARTHROSCOPY     Right  . LEFT HEART CATH AND CORONARY ANGIOGRAPHY N/A 02/01/2017   Procedure: Left Heart Cath and Coronary Angiography;  Surgeon: Linda Harp, MD;  Location: [facility];  Service: Cardiovascular;  Laterality: N/A;  . LITHOTRIPSY  2013  . TOTAL ABDOMINAL HYSTERECTOMY    . TOTAL KNEE ARTHROPLASTY Left 04/08/2015   Procedure: LEFT TOTAL KNEE ARTHROPLASTY;  Surgeon: Linda Pear, MD;  Location: [facility];  Service: Orthopedics;  Laterality: Left;    Current Medications: Outpatient Medications Prior to Visit  Medication Sig Dispense Refill  . aspirin EC 81 MG tablet Take 1 tablet (81 mg total) by mouth daily. 90 tablet 3  . atorvastatin (LIPITOR) 40 MG tablet Take 40 mg by mouth daily.    . citalopram (CELEXA) 20 MG tablet Take 20 mg by mouth daily.    .  empagliflozin (JARDIANCE) 25 MG TABS tablet Take 25 mg by mouth daily.    . enalapril (VASOTEC) 10 MG tablet Take 10 mg by mouth at bedtime.     . fluticasone (FLONASE) 50 MCG/ACT nasal spray Place 1-2 sprays into both nostrils daily as needed for allergies or rhinitis.    Marland Kitchen liraglutide (VICTOZA) 18 MG/3ML SOPN Inject 1.8 mg into the skin every morning.    . metFORMIN (GLUCOPHAGE) 1000 MG tablet Take 1 tablet (1,000 mg total) by mouth 2 (two) times daily with a meal. Restart medication 48 hrs after discharge.    Marland Kitchen omeprazole (PRILOSEC) 40 MG capsule Take 20 mg by mouth daily.     Marland Kitchen oxybutynin (DITROPAN-XL) 10 MG 24 hr tablet Take 15 mg by mouth daily.     Linda Washington Glycol-Propyl Glycol (SYSTANE OP) Apply 1-2 drops to eye daily as needed (dry eyes).     No facility-administered medications prior to visit.      Allergies:   Hydrocodone; Methocarbamol; Oxycodone; and Latex   Social History   Social History  . Marital status: Divorced    Spouse name: N/A  . Number of children: N/A  . Years of education: Some colle   Occupational History  . Retired    Social History Main Topics  . Smoking status: Never Smoker  . Smokeless tobacco: Never Used  . Alcohol use 0.0 oz/week     Comment: Rare-wine  . Drug use: No  . Sexual activity: Not Asked   Other Topics Concern  . None   Social History Narrative   Lives at home with Linda Washington, roommate   Caffeine use: 1 Coffee day, 1-2 a day of tea, 1-2 sodas a day      Family History:  The patient's family history includes Heart disease in her father and mother; Heart disease (age of onset: 28) in her brother.      ROS:   Please see the history of present illness.    ROS All other systems reviewed and are negative.   PHYSICAL EXAM:   VS:  BP 114/80   Pulse 78   Ht 5' 8.5" (1.74 m)   Wt 270 lb 12.8 oz (122.8 kg)   SpO2 97%   BMI 40.58 kg/m    GEN: Well nourished, well developed, in no acute distress, obese HEENT: normal    Neck: no JVD, carotid bruits, or masses Cardiac: RRR; no murmurs, rubs, or gallops,no edema  Respiratory:  clear to auscultation bilaterally, normal work of breathing GI: soft, nontender, nondistended, + BS MS: no deformity or atrophy  Skin: warm and dry,  no rash Neuro:  Alert and Oriented x 3, Strength and sensation are intact Psych: euthymic mood, full affect    Wt Readings from Last 3 Encounters:  02/11/17 270 lb 12.8 oz (122.8 kg)  02/01/17 282 lb (127.9 kg)  07/01/16 265 lb (120.2 kg)      Studies/Labs Reviewed:   EKG:  EKG is NOT ordered today.    Recent Labs: 02/01/2017: BUN 11; Creatinine, Ser 0.80; Hemoglobin 12.9; Platelets 346; Potassium 3.7; Sodium 138   Lipid Panel No results found for: CHOL, TRIG, HDL, CHOLHDL, VLDL, LDLCALC, LDLDIRECT  Additional studies/ records that were reviewed today include:   ECHOCARDIOGRAM: 03/2015 Study Conclusions - Left ventricle: The cavity size was normal. Systolic function was normal. The estimated ejection fraction was in the range of 55% to 60%. Wall motion was normal; there were no regional wall motion abnormalities. - Aortic valve: There was mild regurgitation. - Mitral valve: There was trivial regurgitation. - Left atrium: The atrium was moderately dilated. - Atrial septum: A patent foramen ovale cannot be excluded. - Tricuspid valve: There was trivial regurgitation. - Recommendations: Consider agitated saline contrast study to rule out PFO.   02/01/17 Left Heart Cath and Coronary Angiography  Conclusion    The left ventricular systolic function is normal.  LV end diastolic pressure is normal.  The left ventricular ejection fraction is 55-65% by visual estimate.   Linda Washington has normal coronary arteries and normal LV function. I believe her chest pain is noncardiac. The sheath was removed and a TR band was placed on the right wrist to achieve patent hemostasis. The patient left the lab in stable  condition. She'll be discharged home later today with outpatient follow-up.     ASSESSMENT & PLAN:   Chest pain: resolved with no recurrence. Cath on 02/01/17 showed no CAD.  Post operative atrial flutter: sounds regular on exam. Felt to be post operative and Xarelto discontinued in 03/2016 given no recurrence. If this returns she will need to go back on anticoagulation for CHADSVASC of at least 4.   HTN: BP well controlled today.  Obesity: Body mass index is 40.58 kg/m. Diet and weight loss recommended.   HLD: continue statin   DMT2: most recent HgA1c at 6.4. Continue current reigmen   Medication Adjustments/Labs and Tests Ordered: Current medicines are reviewed at length with the patient today.  Concerns regarding medicines are outlined above.  Medication changes, Labs and Tests ordered today are listed in the Patient Instructions below. Patient Instructions  Medication Instructions:  None ordered  Labwork: Nine ordered  Testing/Procedures: None ordered  Follow-Up: Your physician wants you to follow-up in: [place] will receive a reminder letter in the mail two months in advance. If you don't receive a letter, please call our office to schedule the follow-up appointment.   Any Other Special Instructions Will Be Listed Below (If Applicable).     If you need a refill on your cardiac medications before your next appointment, please call your pharmacy.      Signed, Angelena Form, PA-C  02/11/2017 8:31 AM    [facility] [place], [place], [place]  [address] Phone: [phone]; Fax: [phone]

## 2017-02-11 ENCOUNTER — Ambulatory Visit (INDEPENDENT_AMBULATORY_CARE_PROVIDER_SITE_OTHER): Payer: Medicare HMO | Admitting: Physician Assistant

## 2017-02-11 ENCOUNTER — Encounter: Payer: Self-pay | Admitting: Physician Assistant

## 2017-02-11 VITALS — BP 114/80 | HR 78 | Ht 68.5 in | Wt 270.8 lb

## 2017-02-11 DIAGNOSIS — E118 Type 2 diabetes mellitus with unspecified complications: Secondary | ICD-10-CM | POA: Diagnosis not present

## 2017-02-11 DIAGNOSIS — E785 Hyperlipidemia, unspecified: Secondary | ICD-10-CM

## 2017-02-11 DIAGNOSIS — I208 Other forms of angina pectoris: Secondary | ICD-10-CM | POA: Diagnosis not present

## 2017-02-11 DIAGNOSIS — I10 Essential (primary) hypertension: Secondary | ICD-10-CM | POA: Diagnosis not present

## 2017-02-11 DIAGNOSIS — I480 Paroxysmal atrial fibrillation: Secondary | ICD-10-CM | POA: Diagnosis not present

## 2017-02-11 NOTE — Patient Instructions (Signed)
Medication Instructions:  None ordered  Labwork: Nine ordered  Testing/Procedures: None ordered  Follow-Up: Your physician wants you to follow-up in: [place] will receive a reminder letter in the mail two months in advance. If you don't receive a letter, please call our office to schedule the follow-up appointment.   Any Other Special Instructions Will Be Listed Below (If Applicable).     If you need a refill on your cardiac medications before your next appointment, please call your pharmacy.

## 2017-03-10 DIAGNOSIS — E1142 Type 2 diabetes mellitus with diabetic polyneuropathy: Secondary | ICD-10-CM | POA: Diagnosis not present

## 2017-03-10 DIAGNOSIS — Z794 Long term (current) use of insulin: Secondary | ICD-10-CM | POA: Diagnosis not present

## 2017-03-10 DIAGNOSIS — M2042 Other hammer toe(s) (acquired), left foot: Secondary | ICD-10-CM | POA: Diagnosis not present

## 2017-03-10 DIAGNOSIS — Z5181 Encounter for therapeutic drug level monitoring: Secondary | ICD-10-CM | POA: Diagnosis not present

## 2017-03-10 DIAGNOSIS — Z6841 Body Mass Index (BMI) 40.0 and over, adult: Secondary | ICD-10-CM | POA: Diagnosis not present

## 2017-06-11 DIAGNOSIS — M2042 Other hammer toe(s) (acquired), left foot: Secondary | ICD-10-CM | POA: Diagnosis not present

## 2017-06-11 DIAGNOSIS — Z5181 Encounter for therapeutic drug level monitoring: Secondary | ICD-10-CM | POA: Diagnosis not present

## 2017-06-11 DIAGNOSIS — Z6841 Body Mass Index (BMI) 40.0 and over, adult: Secondary | ICD-10-CM | POA: Diagnosis not present

## 2017-06-11 DIAGNOSIS — E1142 Type 2 diabetes mellitus with diabetic polyneuropathy: Secondary | ICD-10-CM | POA: Diagnosis not present

## 2017-07-24 DIAGNOSIS — R6883 Chills (without fever): Secondary | ICD-10-CM | POA: Diagnosis not present

## 2017-07-24 DIAGNOSIS — Z7984 Long term (current) use of oral hypoglycemic drugs: Secondary | ICD-10-CM | POA: Diagnosis not present

## 2017-07-24 DIAGNOSIS — Z794 Long term (current) use of insulin: Secondary | ICD-10-CM | POA: Diagnosis not present

## 2017-07-24 DIAGNOSIS — L508 Other urticaria: Secondary | ICD-10-CM | POA: Diagnosis not present

## 2017-07-24 DIAGNOSIS — E119 Type 2 diabetes mellitus without complications: Secondary | ICD-10-CM | POA: Diagnosis not present

## 2017-08-03 DIAGNOSIS — Z23 Encounter for immunization: Secondary | ICD-10-CM | POA: Diagnosis not present

## 2017-08-03 DIAGNOSIS — N390 Urinary tract infection, site not specified: Secondary | ICD-10-CM | POA: Diagnosis not present

## 2017-08-26 DIAGNOSIS — D649 Anemia, unspecified: Secondary | ICD-10-CM | POA: Diagnosis not present

## 2017-08-26 DIAGNOSIS — Z23 Encounter for immunization: Secondary | ICD-10-CM | POA: Diagnosis not present

## 2017-11-15 DIAGNOSIS — R69 Illness, unspecified: Secondary | ICD-10-CM | POA: Diagnosis not present

## 2017-12-01 DIAGNOSIS — J069 Acute upper respiratory infection, unspecified: Secondary | ICD-10-CM | POA: Diagnosis not present

## 2017-12-23 DIAGNOSIS — D1801 Hemangioma of skin and subcutaneous tissue: Secondary | ICD-10-CM | POA: Diagnosis not present

## 2017-12-23 DIAGNOSIS — L7211 Pilar cyst: Secondary | ICD-10-CM | POA: Diagnosis not present

## 2017-12-23 DIAGNOSIS — Z85828 Personal history of other malignant neoplasm of skin: Secondary | ICD-10-CM | POA: Diagnosis not present

## 2017-12-23 DIAGNOSIS — D485 Neoplasm of uncertain behavior of skin: Secondary | ICD-10-CM | POA: Diagnosis not present

## 2017-12-23 DIAGNOSIS — L821 Other seborrheic keratosis: Secondary | ICD-10-CM | POA: Diagnosis not present

## 2017-12-23 DIAGNOSIS — L570 Actinic keratosis: Secondary | ICD-10-CM | POA: Diagnosis not present

## 2017-12-23 DIAGNOSIS — D3611 Benign neoplasm of peripheral nerves and autonomic nervous system of face, head, and neck: Secondary | ICD-10-CM | POA: Diagnosis not present

## 2017-12-23 DIAGNOSIS — L739 Follicular disorder, unspecified: Secondary | ICD-10-CM | POA: Diagnosis not present

## 2018-01-12 DIAGNOSIS — I10 Essential (primary) hypertension: Secondary | ICD-10-CM | POA: Diagnosis not present

## 2018-01-12 DIAGNOSIS — E611 Iron deficiency: Secondary | ICD-10-CM | POA: Diagnosis not present

## 2018-01-12 DIAGNOSIS — M47816 Spondylosis without myelopathy or radiculopathy, lumbar region: Secondary | ICD-10-CM | POA: Diagnosis not present

## 2018-01-12 DIAGNOSIS — E7800 Pure hypercholesterolemia, unspecified: Secondary | ICD-10-CM | POA: Diagnosis not present

## 2018-01-12 DIAGNOSIS — N3281 Overactive bladder: Secondary | ICD-10-CM | POA: Diagnosis not present

## 2018-01-12 DIAGNOSIS — E559 Vitamin D deficiency, unspecified: Secondary | ICD-10-CM | POA: Diagnosis not present

## 2018-01-12 DIAGNOSIS — K219 Gastro-esophageal reflux disease without esophagitis: Secondary | ICD-10-CM | POA: Diagnosis not present

## 2018-01-12 DIAGNOSIS — E1142 Type 2 diabetes mellitus with diabetic polyneuropathy: Secondary | ICD-10-CM | POA: Diagnosis not present

## 2018-01-12 DIAGNOSIS — E039 Hypothyroidism, unspecified: Secondary | ICD-10-CM | POA: Diagnosis not present

## 2018-02-08 DIAGNOSIS — Z6841 Body Mass Index (BMI) 40.0 and over, adult: Secondary | ICD-10-CM | POA: Diagnosis not present

## 2018-02-08 DIAGNOSIS — M2042 Other hammer toe(s) (acquired), left foot: Secondary | ICD-10-CM | POA: Diagnosis not present

## 2018-02-08 DIAGNOSIS — E1142 Type 2 diabetes mellitus with diabetic polyneuropathy: Secondary | ICD-10-CM | POA: Diagnosis not present

## 2018-02-08 DIAGNOSIS — Z5181 Encounter for therapeutic drug level monitoring: Secondary | ICD-10-CM | POA: Diagnosis not present

## 2018-02-08 DIAGNOSIS — Z7984 Long term (current) use of oral hypoglycemic drugs: Secondary | ICD-10-CM | POA: Diagnosis not present

## 2018-03-24 DIAGNOSIS — L820 Inflamed seborrheic keratosis: Secondary | ICD-10-CM | POA: Diagnosis not present

## 2018-03-24 DIAGNOSIS — L244 Irritant contact dermatitis due to drugs in contact with skin: Secondary | ICD-10-CM | POA: Diagnosis not present

## 2018-03-24 DIAGNOSIS — L509 Urticaria, unspecified: Secondary | ICD-10-CM | POA: Diagnosis not present

## 2018-03-24 DIAGNOSIS — Z85828 Personal history of other malignant neoplasm of skin: Secondary | ICD-10-CM | POA: Diagnosis not present

## 2018-04-23 DIAGNOSIS — Z85828 Personal history of other malignant neoplasm of skin: Secondary | ICD-10-CM | POA: Diagnosis not present

## 2018-04-23 DIAGNOSIS — Z91040 Latex allergy status: Secondary | ICD-10-CM | POA: Insufficient documentation

## 2018-04-23 DIAGNOSIS — E1140 Type 2 diabetes mellitus with diabetic neuropathy, unspecified: Secondary | ICD-10-CM | POA: Diagnosis not present

## 2018-04-23 DIAGNOSIS — E039 Hypothyroidism, unspecified: Secondary | ICD-10-CM | POA: Insufficient documentation

## 2018-04-23 DIAGNOSIS — Z79899 Other long term (current) drug therapy: Secondary | ICD-10-CM | POA: Diagnosis not present

## 2018-04-23 DIAGNOSIS — Z7984 Long term (current) use of oral hypoglycemic drugs: Secondary | ICD-10-CM | POA: Diagnosis not present

## 2018-04-23 DIAGNOSIS — I10 Essential (primary) hypertension: Secondary | ICD-10-CM | POA: Diagnosis not present

## 2018-04-23 DIAGNOSIS — Z7982 Long term (current) use of aspirin: Secondary | ICD-10-CM | POA: Diagnosis not present

## 2018-04-23 DIAGNOSIS — R040 Epistaxis: Secondary | ICD-10-CM | POA: Insufficient documentation

## 2018-04-23 DIAGNOSIS — Z96652 Presence of left artificial knee joint: Secondary | ICD-10-CM | POA: Insufficient documentation

## 2018-04-24 ENCOUNTER — Emergency Department (HOSPITAL_COMMUNITY)
Admission: EM | Admit: 2018-04-24 | Discharge: 2018-04-24 | Disposition: A | Discharge status: Home / Self Care | Payer: Medicare HMO | Attending: Emergency Medicine | Admitting: Emergency Medicine

## 2018-04-24 ENCOUNTER — Other Ambulatory Visit: Payer: Self-pay

## 2018-04-24 ENCOUNTER — Encounter (HOSPITAL_COMMUNITY): Payer: Self-pay | Admitting: *Deleted

## 2018-04-24 DIAGNOSIS — Z96652 Presence of left artificial knee joint: Secondary | ICD-10-CM | POA: Diagnosis not present

## 2018-04-24 DIAGNOSIS — E1140 Type 2 diabetes mellitus with diabetic neuropathy, unspecified: Secondary | ICD-10-CM | POA: Diagnosis not present

## 2018-04-24 DIAGNOSIS — I10 Essential (primary) hypertension: Secondary | ICD-10-CM | POA: Diagnosis not present

## 2018-04-24 DIAGNOSIS — R040 Epistaxis: Secondary | ICD-10-CM

## 2018-04-24 DIAGNOSIS — Z79899 Other long term (current) drug therapy: Secondary | ICD-10-CM | POA: Diagnosis not present

## 2018-04-24 DIAGNOSIS — Z7984 Long term (current) use of oral hypoglycemic drugs: Secondary | ICD-10-CM | POA: Diagnosis not present

## 2018-04-24 DIAGNOSIS — Z91040 Latex allergy status: Secondary | ICD-10-CM | POA: Diagnosis not present

## 2018-04-24 DIAGNOSIS — E039 Hypothyroidism, unspecified: Secondary | ICD-10-CM | POA: Diagnosis not present

## 2018-04-24 DIAGNOSIS — Z7982 Long term (current) use of aspirin: Secondary | ICD-10-CM | POA: Diagnosis not present

## 2018-04-24 DIAGNOSIS — Z85828 Personal history of other malignant neoplasm of skin: Secondary | ICD-10-CM | POA: Diagnosis not present

## 2018-04-24 MED ORDER — LIDOCAINE-EPINEPHRINE 2 %-1:100000 IJ SOLN: 20.0000 mL | Freq: Once | INTRAMUSCULAR | Status: DC

## 2018-04-24 MED ORDER — LIDOCAINE-EPINEPHRINE (PF) 2 %-1:200000 IJ SOLN
10.0000 mL | Freq: Once | INTRAMUSCULAR | Status: AC
  Administered 2018-04-24: 10 mL

## 2018-04-24 MED ORDER — OXYMETAZOLINE HCL 0.05 % NA SOLN
1.0000 | Freq: Once | NASAL | Status: AC
  Administered 2018-04-24: 1 via NASAL
  Filled 2018-04-24: qty 15

## 2018-04-24 MED ORDER — LIDOCAINE-EPINEPHRINE (PF) 2 %-1:200000 IJ SOLN
INTRAMUSCULAR | Status: AC
  Administered 2018-04-24: 10 mL
  Filled 2018-04-24: qty 20

## 2018-04-24 NOTE — ED Provider Notes (Signed)
[facility] DEPT Provider Note   CSN: [id number] Arrival date & time: 04/23/18  2327     History   Chief Complaint Chief Complaint  Patient presents with  . Epistaxis    HPI Linda Washington is a 67 y.o. female.  67 yo F with a chief complaint of epistaxis.  This started today.  Has been going on throughout the day.  She has been applying pressure and had timed it that she had been doing it for 20 minutes without stopping however continued.  She is on a baby aspirin a day.  Denies any blood thinner use.  Denies trauma.  Denies fevers.  The history is provided by the patient.  Epistaxis   This is a new problem. The current episode started 6 to 12 hours ago. The problem occurs constantly. The problem has not changed since onset.The problem is associated with aspirin. The bleeding has been from the right nare. She has tried applying pressure for the symptoms. The treatment provided no relief. Her past medical history does not include bleeding disorder, nose-picking or frequent nosebleeds.    Past Medical History:  Diagnosis Date  . Anxiety    takes Citalopram daily  . Arthritis    generalized  . Basal cell carcinoma   . GERD (gastroesophageal reflux disease)    takes Omeprazole daily  . History of Bell's palsy    left side  . History of bronchitis   . History of hiatal hernia   . History of kidney stones   . Hyperlipemia    takes Simvastatin daily  . Hypertension    takes Enalapril daily  . Hypothyroidism   . Incontinence    takes Ditropan daily  . Joint swelling    left knee  . Neuropathy    takes Gabapentin daily  . PONV (postoperative nausea and vomiting)   . RLS (restless legs syndrome)   . Seasonal allergies    takes Claritin daioy as needed  . Sleep apnea    pt. states that she does not have a CPAP  . Tremor   . Type 2 diabetes mellitus (HCC)    takes Metformin and Amaryl daily    Patient Active Problem List   Diagnosis Date  Noted  . Unstable angina ([place]) 02/01/2017  . Diabetes mellitus ([place]) 02/01/2017  . Arthritis of knee 04/08/2015  . Primary osteoarthritis of left knee 04/07/2015    Past Surgical History:  Procedure Laterality Date  . ACHILLES TENDON REPAIR Right   . APPENDECTOMY    . BASAL CELL CARCINOMA EXCISION     Nose  . BREAST LUMPECTOMY    . CARDIAC CATHETERIZATION     pt. states approximately 15 years ago  . CARPAL TUNNEL RELEASE Bilateral   . DILATION AND CURETTAGE OF UTERUS    . EYE SURGERY Bilateral    cataract surgery  . KNEE ARTHROSCOPY     Right  . LEFT HEART CATH AND CORONARY ANGIOGRAPHY N/A 02/01/2017   Procedure: Left Heart Cath and Coronary Angiography;  Surgeon: Lorretta Harp, MD;  Location: [facility];  Service: Cardiovascular;  Laterality: N/A;  . LITHOTRIPSY  2013  . TOTAL ABDOMINAL HYSTERECTOMY    . TOTAL KNEE ARTHROPLASTY Left 04/08/2015   Procedure: LEFT TOTAL KNEE ARTHROPLASTY;  Surgeon: Frederik Pear, MD;  Location: [facility];  Service: Orthopedics;  Laterality: Left;     OB History   None      Home Medications    Prior  to Admission medications   Medication Sig Start Date End Date Taking? Authorizing Provider  aspirin EC 81 MG tablet Take 1 tablet (81 mg total) by mouth daily. 04/16/16   Jerline Pain, MD  atorvastatin (LIPITOR) 40 MG tablet Take 40 mg by mouth daily.    [provider]  citalopram (CELEXA) 20 MG tablet Take 20 mg by mouth daily.    [provider]  empagliflozin (JARDIANCE) 25 MG TABS tablet Take 25 mg by mouth daily.    [provider]  enalapril (VASOTEC) 10 MG tablet Take 10 mg by mouth at bedtime.     [provider]  fluticasone (FLONASE) 50 MCG/ACT nasal spray Place 1-2 sprays into both nostrils daily as needed for allergies or rhinitis.    [provider]  liraglutide (VICTOZA) 18 MG/3ML SOPN Inject 1.8 mg into the skin every morning.    [provider]  metFORMIN (GLUCOPHAGE)  1000 MG tablet Take 1 tablet (1,000 mg total) by mouth 2 (two) times daily with a meal. Restart medication 48 hrs after discharge. 02/01/17   [facility], Tami Lin, PA  omeprazole (PRILOSEC) 40 MG capsule Take 20 mg by mouth daily.     [provider]  oxybutynin (DITROPAN-XL) 10 MG 24 hr tablet Take 15 mg by mouth daily.     [provider]  Polyethyl Glycol-Propyl Glycol (SYSTANE OP) Apply 1-2 drops to eye daily as needed (dry eyes).    [provider]    Family History Family History  Problem Relation Age of Onset  . Heart disease Mother   . Heart disease Father   . Heart disease Brother 3    Social History Social History   Tobacco Use  . Smoking status: Never Smoker  . Smokeless tobacco: Never Used  Substance Use Topics  . Alcohol use: Yes    Alcohol/week: 0.0 oz    Comment: Rare-wine  . Drug use: No     Allergies   Hydrocodone; Methocarbamol; Oxycodone; and Latex   Review of Systems Review of Systems  Constitutional: Negative for chills and fever.  HENT: Positive for nosebleeds. Negative for congestion and rhinorrhea.   Eyes: Negative for redness and visual disturbance.  Respiratory: Negative for shortness of breath and wheezing.   Cardiovascular: Negative for chest pain and palpitations.  Gastrointestinal: Negative for nausea and vomiting.  Genitourinary: Negative for dysuria and urgency.  Musculoskeletal: Negative for arthralgias and myalgias.  Skin: Negative for pallor and wound.  Neurological: Negative for dizziness and headaches.     Physical Exam Updated Vital Signs BP (!) 159/81   Pulse 80   Temp 97.9 F (36.6 C) (Oral)   Resp 18   Ht 5\' 8"  (1.727 m)   Wt 122.5 kg (270 lb)   SpO2 98%   BMI 41.05 kg/m   Physical Exam  Constitutional: She is oriented to person, place, and time. She appears well-developed and well-nourished. No distress.  HENT:  Head: Normocephalic and atraumatic.  Some mild hypervascularity in the  Kiesselbach's plexus on the right naris  Eyes: Pupils are equal, round, and reactive to light. EOM are normal.  Neck: Normal range of motion. Neck supple.  Cardiovascular: Normal rate and regular rhythm. Exam reveals no gallop and no friction rub.  No murmur heard. Pulmonary/Chest: Effort normal. She has no wheezes. She has no rales.  Abdominal: Soft. She exhibits no distension. There is no tenderness.  Musculoskeletal: She exhibits no edema or tenderness.  Neurological: She is alert and  oriented to person, place, and time.  Skin: Skin is warm and dry. She is not diaphoretic.  Psychiatric: She has a normal mood and affect. Her behavior is normal.  Nursing note and vitals reviewed.    ED Treatments / Results  Labs (all labs ordered are listed, but only abnormal results are displayed) Labs Reviewed - No data to display  EKG None  Radiology No results found.  Procedures .Epistaxis Management Date/Time: 04/24/2018 3:18 AM Performed by: Deno Etienne, DO Authorized by: Deno Etienne, DO   Consent:    Consent obtained:  Verbal   Consent given by:  Patient   Risks discussed:  Bleeding, infection and nasal injury   Alternatives discussed:  No treatment, delayed treatment and alternative treatment Anesthesia (see MAR for exact dosages):    Anesthesia method:  Topical application   Topical anesthetic:  Lidocaine gel and epinephrine Procedure details:    Treatment site:  R anterior   Treatment complexity:  Limited   Treatment episode: initial   Post-procedure details:    Assessment:  Bleeding stopped   Patient tolerance of procedure:  Tolerated well, no immediate complications Comments:     Pressure with cotton roll, lido w/epi and afirin   (including critical care time)  Medications Ordered in ED Medications  oxymetazoline (AFRIN) 0.05 % nasal spray 1 spray (1 spray Each Nare Given 04/24/18 0104)  lidocaine-EPINEPHrine (XYLOCAINE W/EPI) 2 %-1:200000 (PF) injection 10 mL (10 mLs  Infiltration Given by Other 04/24/18 0249)     Initial Impression / Assessment and Plan / ED Course  I have reviewed the triage vital signs and the nursing notes.  Pertinent labs & imaging results that were available during my care of the patient were reviewed by me and considered in my medical decision making (see chart for details).     67 yo F with a chief complaint of epistaxis.  Going on throughout the course of today.  Denies injury.  Bleeding noted from the right naris on my exam.  No source was identified.  After direct pressure the bleeding had resolved.   Patient observed for a short time in the ED without recurrence.  Discharge home.  3:45 AM:  I have discussed the diagnosis/risks/treatment options with the patient and believe the pt to be eligible for discharge home to follow-up with PCP, ENT if needed. We also discussed returning to the ED immediately if new or worsening sx occur. We discussed the sx which are most concerning (e.g., bleeding not improved with direct pressure and Afrin) that necessitate immediate return. Medications administered to the patient during their visit and any new prescriptions provided to the patient are listed below.  Medications given during this visit Medications  oxymetazoline (AFRIN) 0.05 % nasal spray 1 spray (1 spray Each Nare Given 04/24/18 0104)  lidocaine-EPINEPHrine (XYLOCAINE W/EPI) 2 %-1:200000 (PF) injection 10 mL (10 mLs Infiltration Given by Other 04/24/18 0249)      The patient appears reasonably screen and/or stabilized for discharge and I doubt any other medical condition or other [facility] requiring further screening, evaluation, or treatment in the ED at this time prior to discharge.   Final Clinical Impressions(s) / ED Diagnoses   Final diagnoses:  Epistaxis    ED Discharge Orders    None       Deno Etienne, DO 04/24/18 0345

## 2018-04-24 NOTE — ED Notes (Signed)
Bed: WA18 Expected date:  Expected time:  Means of arrival:  Comments: Triage 1 

## 2018-04-24 NOTE — ED Notes (Signed)
Scant bleeding noted. Afrin applied in both nostrils

## 2018-04-24 NOTE — ED Triage Notes (Signed)
Pt stated "My nose started bleeding around 4pm, it starts and then stops."

## 2018-04-28 DIAGNOSIS — R040 Epistaxis: Secondary | ICD-10-CM | POA: Insufficient documentation

## 2018-06-09 DIAGNOSIS — Z6841 Body Mass Index (BMI) 40.0 and over, adult: Secondary | ICD-10-CM | POA: Diagnosis not present

## 2018-06-09 DIAGNOSIS — Z79899 Other long term (current) drug therapy: Secondary | ICD-10-CM | POA: Diagnosis not present

## 2018-06-09 DIAGNOSIS — E1142 Type 2 diabetes mellitus with diabetic polyneuropathy: Secondary | ICD-10-CM | POA: Diagnosis not present

## 2018-06-09 DIAGNOSIS — Z7984 Long term (current) use of oral hypoglycemic drugs: Secondary | ICD-10-CM | POA: Diagnosis not present

## 2018-06-09 DIAGNOSIS — M2042 Other hammer toe(s) (acquired), left foot: Secondary | ICD-10-CM | POA: Diagnosis not present

## 2018-06-14 ENCOUNTER — Ambulatory Visit: Payer: Medicare HMO | Admitting: Podiatry

## 2018-06-14 ENCOUNTER — Encounter: Payer: Self-pay | Admitting: Podiatry

## 2018-06-14 DIAGNOSIS — E119 Type 2 diabetes mellitus without complications: Secondary | ICD-10-CM

## 2018-06-14 DIAGNOSIS — M79676 Pain in unspecified toe(s): Secondary | ICD-10-CM | POA: Diagnosis not present

## 2018-06-14 DIAGNOSIS — M2010 Hallux valgus (acquired), unspecified foot: Secondary | ICD-10-CM

## 2018-06-14 DIAGNOSIS — M79675 Pain in left toe(s): Secondary | ICD-10-CM

## 2018-06-14 DIAGNOSIS — M2040 Other hammer toe(s) (acquired), unspecified foot: Secondary | ICD-10-CM

## 2018-06-14 DIAGNOSIS — B351 Tinea unguium: Secondary | ICD-10-CM

## 2018-06-14 NOTE — Progress Notes (Signed)
This patient presents the office stating that she is coming to the office for nail care.  She is also requesting a new pair of diabetic shoes.     GENERAL APPEARANCE: Alert, conversant. Appropriately groomed. No acute distress.  VASCULAR: Pedal pulses are  palpable at  [facility] and PT bilateral.  Capillary refill time is immediate to all digits,  Normal temperature gradient.   NEUROLOGIC: sensation is normal to 5.07 monofilament at 5/5 sites bilateral.  Light touch is intact bilateral, Muscle strength normal.  MUSCULOSKELETAL: acceptable muscle strength, tone and stability bilateral.  Intrinsic muscluature intact bilateral.  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  DERMATOLOGIC: skin color, texture, and turgor are within normal limits.  No preulcerative lesions or ulcers  are seen, no interdigital maceration noted.  No open lesions present.  Digital nails are asymptomatic. No drainage noted.  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  Patient is being seen by [facility] for diabetic shoes.   Gardiner Barefoot DPM

## 2018-06-15 DIAGNOSIS — M654 Radial styloid tenosynovitis [de Quervain]: Secondary | ICD-10-CM | POA: Diagnosis not present

## 2018-06-19 DIAGNOSIS — I5033 Acute on chronic diastolic (congestive) heart failure: Secondary | ICD-10-CM

## 2018-06-19 HISTORY — DX: Acute on chronic diastolic (congestive) heart failure: I50.33

## 2018-06-20 ENCOUNTER — Encounter (HOSPITAL_COMMUNITY): Payer: Self-pay

## 2018-06-20 ENCOUNTER — Other Ambulatory Visit: Payer: Self-pay

## 2018-06-20 ENCOUNTER — Emergency Department (HOSPITAL_COMMUNITY): Payer: Medicare HMO

## 2018-06-20 ENCOUNTER — Inpatient Hospital Stay (HOSPITAL_COMMUNITY)
Admission: EM | Admit: 2018-06-20 | Discharge: 2018-06-23 | DRG: 308 | Disposition: A | Discharge status: Home / Self Care | Payer: Medicare HMO | Attending: Internal Medicine | Admitting: Internal Medicine

## 2018-06-20 DIAGNOSIS — I509 Heart failure, unspecified: Secondary | ICD-10-CM

## 2018-06-20 DIAGNOSIS — I10 Essential (primary) hypertension: Secondary | ICD-10-CM | POA: Diagnosis not present

## 2018-06-20 DIAGNOSIS — Z7984 Long term (current) use of oral hypoglycemic drugs: Secondary | ICD-10-CM | POA: Diagnosis not present

## 2018-06-20 DIAGNOSIS — R0602 Shortness of breath: Secondary | ICD-10-CM | POA: Diagnosis not present

## 2018-06-20 DIAGNOSIS — Z6841 Body Mass Index (BMI) 40.0 and over, adult: Secondary | ICD-10-CM | POA: Diagnosis not present

## 2018-06-20 DIAGNOSIS — I110 Hypertensive heart disease with heart failure: Secondary | ICD-10-CM | POA: Diagnosis present

## 2018-06-20 DIAGNOSIS — E039 Hypothyroidism, unspecified: Secondary | ICD-10-CM | POA: Diagnosis present

## 2018-06-20 DIAGNOSIS — I5033 Acute on chronic diastolic (congestive) heart failure: Secondary | ICD-10-CM | POA: Diagnosis not present

## 2018-06-20 DIAGNOSIS — I480 Paroxysmal atrial fibrillation: Secondary | ICD-10-CM | POA: Diagnosis present

## 2018-06-20 DIAGNOSIS — E876 Hypokalemia: Secondary | ICD-10-CM | POA: Diagnosis present

## 2018-06-20 DIAGNOSIS — I483 Typical atrial flutter: Principal | ICD-10-CM | POA: Diagnosis present

## 2018-06-20 DIAGNOSIS — E1165 Type 2 diabetes mellitus with hyperglycemia: Secondary | ICD-10-CM | POA: Diagnosis present

## 2018-06-20 DIAGNOSIS — I351 Nonrheumatic aortic (valve) insufficiency: Secondary | ICD-10-CM | POA: Diagnosis not present

## 2018-06-20 DIAGNOSIS — Z7982 Long term (current) use of aspirin: Secondary | ICD-10-CM | POA: Diagnosis not present

## 2018-06-20 DIAGNOSIS — Z96652 Presence of left artificial knee joint: Secondary | ICD-10-CM | POA: Diagnosis present

## 2018-06-20 DIAGNOSIS — Z66 Do not resuscitate: Secondary | ICD-10-CM | POA: Diagnosis present

## 2018-06-20 DIAGNOSIS — E785 Hyperlipidemia, unspecified: Secondary | ICD-10-CM | POA: Diagnosis present

## 2018-06-20 DIAGNOSIS — G4733 Obstructive sleep apnea (adult) (pediatric): Secondary | ICD-10-CM | POA: Diagnosis not present

## 2018-06-20 DIAGNOSIS — D638 Anemia in other chronic diseases classified elsewhere: Secondary | ICD-10-CM | POA: Diagnosis not present

## 2018-06-20 DIAGNOSIS — I340 Nonrheumatic mitral (valve) insufficiency: Secondary | ICD-10-CM | POA: Diagnosis not present

## 2018-06-20 DIAGNOSIS — E119 Type 2 diabetes mellitus without complications: Secondary | ICD-10-CM

## 2018-06-20 DIAGNOSIS — I4892 Unspecified atrial flutter: Secondary | ICD-10-CM

## 2018-06-20 DIAGNOSIS — E8770 Fluid overload, unspecified: Secondary | ICD-10-CM | POA: Diagnosis not present

## 2018-06-20 HISTORY — DX: Acute on chronic diastolic (congestive) heart failure: I50.33

## 2018-06-20 LAB — BASIC METABOLIC PANEL
ANION GAP: 10 (ref 5–15)
BUN: 19 mg/dL (ref 8–23)
CO2: 24 mmol/L (ref 22–32)
Calcium: 9 mg/dL (ref 8.9–10.3)
Chloride: 106 mmol/L (ref 98–111)
Creatinine, Ser: 0.95 mg/dL (ref 0.44–1.00)
GFR calc Af Amer: >60 mL/min (ref >60)
GFR, EST NON AFRICAN AMERICAN: 60 mL/min — AB (ref >60)
GLUCOSE: 184 mg/dL — AB (ref 70–99)
POTASSIUM: 4.1 mmol/L (ref 3.5–5.1)
Sodium: 140 mmol/L (ref 135–145)

## 2018-06-20 LAB — CBC
HEMATOCRIT: 35.7 % — AB (ref 36.0–46.0)
HEMOGLOBIN: 10.9 g/dL — AB (ref 12.0–15.0)
MCH: 27.8 pg (ref 26.0–34.0)
MCHC: 30.5 g/dL (ref 30.0–36.0)
MCV: 91.1 fL (ref 78.0–100.0)
Platelets: 335 10*3/uL (ref 150–400)
RBC: 3.92 MIL/uL (ref 3.87–5.11)
RDW: 14.4 % (ref 11.5–15.5)
WBC: 8.1 10*3/uL (ref 4.0–10.5)

## 2018-06-20 LAB — MAGNESIUM: Magnesium: 1.6 mg/dL — ABNORMAL LOW (ref 1.7–2.4)

## 2018-06-20 LAB — BRAIN NATRIURETIC PEPTIDE: B Natriuretic Peptide: 111.9 pg/mL — ABNORMAL HIGH (ref 0.0–100.0)

## 2018-06-20 LAB — GLUCOSE, CAPILLARY
Glucose-Capillary: 113 mg/dL — ABNORMAL HIGH (ref 70–99)
Glucose-Capillary: 119 mg/dL — ABNORMAL HIGH (ref 70–99)

## 2018-06-20 LAB — HEMOGLOBIN A1C
HEMOGLOBIN A1C: 8 % — AB (ref 4.8–5.6)
Mean Plasma Glucose: 182.9 mg/dL

## 2018-06-20 LAB — I-STAT TROPONIN, ED: Troponin i, poc: 0 ng/mL (ref 0.00–0.08)

## 2018-06-20 LAB — TSH: TSH: 1.152 u[IU]/mL (ref 0.350–4.500)

## 2018-06-20 MED ORDER — SODIUM CHLORIDE 0.9% FLUSH
3.0000 mL | Freq: Two times a day (BID) | INTRAVENOUS | Status: DC
  Administered 2018-06-20 – 2018-06-23 (×6): 3 mL via INTRAVENOUS

## 2018-06-20 MED ORDER — SODIUM CHLORIDE 0.9% FLUSH: 3.0000 mL | INTRAVENOUS | Status: DC | PRN

## 2018-06-20 MED ORDER — ONDANSETRON HCL 4 MG/2ML IJ SOLN: 4.0000 mg | Freq: Four times a day (QID) | INTRAMUSCULAR | Status: DC | PRN

## 2018-06-20 MED ORDER — ATORVASTATIN CALCIUM 40 MG PO TABS
40.0000 mg | ORAL_TABLET | Freq: Every day | ORAL | Status: DC
  Administered 2018-06-20 – 2018-06-22 (×3): 40 mg via ORAL
  Filled 2018-06-20: qty 2
  Filled 2018-06-20: qty 1
  Filled 2018-06-20: qty 2

## 2018-06-20 MED ORDER — FUROSEMIDE 10 MG/ML IJ SOLN
40.0000 mg | Freq: Two times a day (BID) | INTRAMUSCULAR | Status: DC
  Administered 2018-06-20 – 2018-06-22 (×4): 40 mg via INTRAVENOUS
  Filled 2018-06-20 (×4): qty 4

## 2018-06-20 MED ORDER — OXYBUTYNIN CHLORIDE ER 15 MG PO TB24
15.0000 mg | ORAL_TABLET | Freq: Every day | ORAL | Status: DC
  Administered 2018-06-21 – 2018-06-23 (×3): 15 mg via ORAL
  Filled 2018-06-20 (×3): qty 1

## 2018-06-20 MED ORDER — ENALAPRIL MALEATE 10 MG PO TABS
10.0000 mg | ORAL_TABLET | Freq: Every day | ORAL | Status: DC
  Administered 2018-06-20 – 2018-06-22 (×3): 10 mg via ORAL
  Filled 2018-06-20 (×3): qty 1

## 2018-06-20 MED ORDER — POLYVINYL ALCOHOL 1.4 % OP SOLN
Freq: Every day | OPHTHALMIC | Status: DC
  Administered 2018-06-22: 13:00:00 via OPHTHALMIC
  Filled 2018-06-20: qty 15

## 2018-06-20 MED ORDER — FUROSEMIDE 10 MG/ML IJ SOLN
40.0000 mg | Freq: Once | INTRAMUSCULAR | Status: AC
  Administered 2018-06-20: 40 mg via INTRAVENOUS
  Filled 2018-06-20: qty 4

## 2018-06-20 MED ORDER — RIVAROXABAN 20 MG PO TABS
20.0000 mg | ORAL_TABLET | Freq: Every day | ORAL | Status: DC
  Administered 2018-06-20 – 2018-06-22 (×3): 20 mg via ORAL
  Filled 2018-06-20 (×3): qty 1

## 2018-06-20 MED ORDER — ACETAMINOPHEN 325 MG PO TABS: 650.0000 mg | ORAL_TABLET | ORAL | Status: DC | PRN

## 2018-06-20 MED ORDER — INSULIN ASPART 100 UNIT/ML ~~LOC~~ SOLN
0.0000 [IU] | Freq: Three times a day (TID) | SUBCUTANEOUS | Status: DC
  Administered 2018-06-21: 2 [IU] via SUBCUTANEOUS
  Administered 2018-06-21 – 2018-06-22 (×3): 3 [IU] via SUBCUTANEOUS
  Administered 2018-06-22: 2 [IU] via SUBCUTANEOUS
  Administered 2018-06-23 (×2): 3 [IU] via SUBCUTANEOUS

## 2018-06-20 MED ORDER — INSULIN ASPART 100 UNIT/ML ~~LOC~~ SOLN: 0.0000 [IU] | Freq: Every day | SUBCUTANEOUS | Status: DC

## 2018-06-20 MED ORDER — ASPIRIN EC 81 MG PO TBEC
81.0000 mg | DELAYED_RELEASE_TABLET | Freq: Every day | ORAL | Status: DC
  Administered 2018-06-21 – 2018-06-23 (×3): 81 mg via ORAL
  Filled 2018-06-20 (×3): qty 1

## 2018-06-20 MED ORDER — PANTOPRAZOLE SODIUM 40 MG PO TBEC
40.0000 mg | DELAYED_RELEASE_TABLET | Freq: Every day | ORAL | Status: DC
  Administered 2018-06-21 – 2018-06-23 (×3): 40 mg via ORAL
  Filled 2018-06-20 (×3): qty 1

## 2018-06-20 MED ORDER — CARVEDILOL 3.125 MG PO TABS
3.1250 mg | ORAL_TABLET | Freq: Two times a day (BID) | ORAL | Status: DC
  Administered 2018-06-20 – 2018-06-23 (×6): 3.125 mg via ORAL
  Filled 2018-06-20 (×6): qty 1

## 2018-06-20 MED ORDER — DABIGATRAN ETEXILATE MESYLATE 150 MG PO CAPS: 150.0000 mg | ORAL_CAPSULE | Freq: Two times a day (BID) | ORAL | Status: DC

## 2018-06-20 MED ORDER — SODIUM CHLORIDE 0.9 % IV SOLN
250.0000 mL | INTRAVENOUS | Status: DC | PRN
  Administered 2018-06-22: 11:00:00 via INTRAVENOUS

## 2018-06-20 MED ORDER — CITALOPRAM HYDROBROMIDE 20 MG PO TABS
20.0000 mg | ORAL_TABLET | Freq: Every day | ORAL | Status: DC
  Administered 2018-06-21 – 2018-06-23 (×3): 20 mg via ORAL
  Filled 2018-06-20 (×2): qty 1
  Filled 2018-06-20: qty 2

## 2018-06-20 NOTE — ED Notes (Signed)
ED Provider at bedside. 

## 2018-06-20 NOTE — Consult Note (Addendum)
Cardiology Consultation:   Patient ID: Linda Washington; [id number]; Mar 15, 1951   Admit date: 06/20/2018 Date of Consult: 06/20/2018  Primary Care Provider: Marda Stalker, PA-C Primary Cardiologist:   Marlou Porch  Primary Electrophysiologist:     Patient Profile:   Linda Washington is a 67 y.o. female with a hx of morbid obesity, type II but diabetes mellitus, hypertension, untreated sleep apnea, paroxysmal atrial fibrillation who is being seen today for the evaluation of atrial fibrillation and congestive heart failure at the request of  [facility] .  History of Present Illness:   Linda Washington is a 67 year old female who is a patient of Dr. Marlou Porch.  She has a history of paroxysmal atrial fibrillation.  She is been more more short of breath for the past several months.  Her shortness of breath acutely worsened over this weekend and she presented to the hospital for further evaluation.  She now presents with symptoms consistent with congestive heart pack failure.  She has marked shortness of breath and has PND and orthopnea.  She gets short of breath with exertion.  There is no real chest discomfort.  She is had leg swelling for the past several weeks.  She denies any syncope or presyncope.  She has had more fatigue than usual. She denies any cold symptoms she has had a cough that is probably related to her congestive heart failure.  Denies any fever..  She admits to eating some salty foods on occasion.  She eats fast food 2-3 times a week.  She eats bacon occasionally and also eats ham occasionally.  Echocardiogram from 2016 revealed normal left ventricular systolic function.  There was no mention of diastolic function.  She had trivial mitral regurgitation and trivial tricuspid regurgitation.    Past Medical History:  Diagnosis Date  . Anxiety    takes Citalopram daily  . Arthritis    generalized  . Basal cell carcinoma   . GERD (gastroesophageal reflux disease)    takes Omeprazole daily  .  History of Bell's palsy    left side  . History of bronchitis   . History of hiatal hernia   . History of kidney stones   . Hyperlipemia    takes Simvastatin daily  . Hypertension    takes Enalapril daily  . Hypothyroidism   . Incontinence    takes Ditropan daily  . Joint swelling    left knee  . Neuropathy    takes Gabapentin daily  . PONV (postoperative nausea and vomiting)   . RLS (restless legs syndrome)   . Seasonal allergies    takes Claritin daioy as needed  . Sleep apnea    pt. states that she does not have a CPAP  . Tremor   . Type 2 diabetes mellitus (HCC)    takes Metformin and Amaryl daily    Past Surgical History:  Procedure Laterality Date  . ACHILLES TENDON REPAIR Right   . APPENDECTOMY    . BASAL CELL CARCINOMA EXCISION     Nose  . BREAST LUMPECTOMY    . CARDIAC CATHETERIZATION     pt. states approximately 15 years ago  . CARPAL TUNNEL RELEASE Bilateral   . DILATION AND CURETTAGE OF UTERUS    . EYE SURGERY Bilateral    cataract surgery  . KNEE ARTHROSCOPY     Right  . LEFT HEART CATH AND CORONARY ANGIOGRAPHY N/A 02/01/2017   Procedure: Left Heart Cath and Coronary Angiography;  Surgeon: Lorretta Harp, MD;  Location: [facility]  INVASIVE CV LAB;  Service: Cardiovascular;  Laterality: N/A;  . LITHOTRIPSY  2013  . TOTAL ABDOMINAL HYSTERECTOMY    . TOTAL KNEE ARTHROPLASTY Left 04/08/2015   Procedure: LEFT TOTAL KNEE ARTHROPLASTY;  Surgeon: Frederik Pear, MD;  Location: [place];  Service: Orthopedics;  Laterality: Left;     Home Medications:  Prior to Admission medications   Medication Sig Start Date End Date Taking? Authorizing Provider  acetaminophen (TYLENOL) 650 MG CR tablet Take 1,300 mg by mouth daily as needed (for pain).   Yes [provider]  aspirin EC 81 MG tablet Take 1 tablet (81 mg total) by mouth daily. 04/16/16  Yes Jerline Pain, MD  atorvastatin (LIPITOR) 40 MG tablet Take 40 mg by mouth at bedtime.    Yes [provider]    cetirizine (ZYRTEC) 10 MG tablet Take 10 mg by mouth daily as needed (for allergies or allergic reactions).    Yes [provider]  citalopram (CELEXA) 20 MG tablet Take 20 mg by mouth daily.   Yes [provider]  enalapril (VASOTEC) 10 MG tablet Take 10 mg by mouth at bedtime.    Yes [provider]  fluticasone (FLONASE) 50 MCG/ACT nasal spray Place 1-2 sprays into both nostrils daily as needed for allergies or rhinitis.   Yes [provider]  glimepiride (AMARYL) 2 MG tablet Take 2 mg by mouth daily before breakfast.  05/09/18  Yes [provider]  metFORMIN (GLUCOPHAGE) 1000 MG tablet Take 1 tablet (1,000 mg total) by mouth 2 (two) times daily with a meal. Restart medication 48 hrs after discharge. Patient taking differently: Take 1,000 mg by mouth 2 (two) times daily with a meal.  02/01/17  Yes Duke, Tami Lin, PA  naproxen (NAPROSYN) 500 MG tablet Take 500 mg by mouth 2 (two) times daily. 06/15/18  Yes [provider]  omeprazole (PRILOSEC) 40 MG capsule Take 40 mg by mouth daily.    Yes [provider]  oxybutynin (DITROPAN XL) 15 MG 24 hr tablet Take 15 mg by mouth daily.   Yes [provider]  Polyethyl Glycol-Propyl Glycol (SYSTANE OP) Place 1-2 drops into both eyes daily.    Yes [provider]    Inpatient Medications: Scheduled Meds:  Continuous Infusions:  PRN Meds:   Allergies:    Allergies  Allergen Reactions  . Hydrocodone     Weird sensation  . Methocarbamol Hives  . Oxycodone     Weird sensation  . Latex Rash    Social History:   Social History   Socioeconomic History  . Marital status: Divorced    Spouse name: Not on file  . Number of children: Not on file  . Years of education: Some colle  . Highest education level: Not on file  Occupational History  . Occupation: Retired  Scientific laboratory technician  . Financial resource strain: Not on file  . Food insecurity:    Worry: Not on  file    Inability: Not on file  . Transportation needs:    Medical: Not on file    Non-medical: Not on file  Tobacco Use  . Smoking status: Never Smoker  . Smokeless tobacco: Never Used  Substance and Sexual Activity  . Alcohol use: Yes    Alcohol/week: 0.0 standard drinks    Comment: Rare-wine  . Drug use: No  . Sexual activity: Not on file  Lifestyle  . Physical activity:    Days per week: Not on file  Minutes per session: Not on file  . Stress: Not on file  Relationships  . Social connections:    Talks on phone: Not on file    Gets together: Not on file    Attends religious service: Not on file    Active member of club or organization: Not on file    Attends meetings of clubs or organizations: Not on file    Relationship status: Not on file  . Intimate partner violence:    Fear of current or ex partner: Not on file    Emotionally abused: Not on file    Physically abused: Not on file    Forced sexual activity: Not on file  Other Topics Concern  . Not on file  Social History Narrative   Lives at home with Lonn Georgia, roommate   Caffeine use: 1 Coffee day, 1-2 a day of tea, 1-2 sodas a day     Family History:    Family History  Problem Relation Age of Onset  . Heart disease Mother   . Heart disease Father   . Heart disease Brother 15     ROS:  Please see the history of present illness.   All other ROS reviewed and negative.     Physical Exam/Data:   Vitals:   06/20/18 1230 06/20/18 1245 06/20/18 1300 06/20/18 1400  BP: (!) 149/62 (!) 146/61 (!) 146/52 (!) 157/52  Pulse: 70 61 71 67  Resp: 16 18 (!) 23 (!) 22  Temp:      TempSrc:      SpO2: 99% 96% 95% 95%    Intake/Output Summary (Last 24 hours) at 06/20/2018 1431 Last data filed at 06/20/2018 1410 Gross per 24 hour  Intake -  Output 1200 ml  Net -1200 ml   There were no vitals filed for this visit. There is no height or weight on file to calculate BMI.  General: Obese, middle-aged female,  mild respiratory distress HEENT: normal Lymph: no adenopathy Neck: Neck.  Could not assess her JVD. Endocrine:  No thryomegaly Vascular: No carotid bruits; FA pulses 2+ bilaterally without bruits  Cardiac: Regular rate S1-S2.  No murmur appreciated. Lungs:  clear to auscultation bilaterally, no wheezing, rhonchi or rales  Abd: soft, nontender, no hepatomegaly  Ext: Trace to 1+ bilateral pitting edema. Musculoskeletal:  No deformities, BUE and BLE strength normal and equal Skin: warm and dry  Neuro:  CNs 2-12 intact, no focal abnormalities noted Psych:  Normal affect   EKG:  The EKG was personally reviewed and demonstrates: Atrial flutter with a ventricular rate of 70. Telemetry:  Telemetry was personally reviewed and demonstrates: Flutter with a controlled ventricular response.  Relevant CV Studies:   Laboratory Data:  Chemistry Recent Labs  Lab 06/20/18 1148  NA 140  K 4.1  CL 106  CO2 24  GLUCOSE 184*  BUN 19  CREATININE 0.95  CALCIUM 9.0  GFRNONAA 60*  GFRAA >60  ANIONGAP 10    No results for input(s): PROT, ALBUMIN, AST, ALT, ALKPHOS, BILITOT in the last 168 hours. Hematology Recent Labs  Lab 06/20/18 1148  WBC 8.1  RBC 3.92  HGB 10.9*  HCT 35.7*  MCV 91.1  MCH 27.8  MCHC 30.5  RDW 14.4  PLT 335   Cardiac EnzymesNo results for input(s): TROPONINI in the last 168 hours.  Recent Labs  Lab 06/20/18 1152  TROPIPOC 0.00    BNP Recent Labs  Lab 06/20/18 1255  BNP 111.9*    DDimer No  results for input(s): DDIMER in the last 168 hours.  Radiology/Studies:  Dg Chest 2 View  Result Date: 06/20/2018 CLINICAL DATA:  Patient complains of increasing lower extremity edema and SOB x 4 days. Denies CP. Has noticed a 3lb weight gain over the past 2 days. Was sent by [facility] for further evaluation of new onset CHF, hx: DM, HTN, BREAST LUMPECTOMY-BENIGN EXAM: CHEST - 2 VIEW COMPARISON:  02/01/2017 FINDINGS: The heart is enlarged. No focal consolidations or pleural  effusions. Mildly prominent interstitial markings are suspicious for interstitial edema given the new cardiomegaly. IMPRESSION: Cardiomegaly and suspected interstitial pulmonary edema. Electronically Signed   By: Nolon Nations M.D.   On: 06/20/2018 12:20    Assessment and Plan:   1. 1.  Acute congestive heart failure: The patient likely has acute diastolic congestive heart failure.  Her systolic function was normal with by echo in 2016.  She now presents with several weeks of worsening shortness of breath which acutely worsened over the past day or so.  She admits to eating some extra salty food recently.  Troponin levels are negative so far.  Her B natruretic peptide is only minimally elevated.  She is had a very nice response to Lasix IV 40 mg.  She is diuresed 2 L so far and is feeling better.  Agree with repeating the echocardiogram.  She had a left heart catheterization in April, 2018.  I do not think that she needs another ischemic work-up.  I suspect that she has diastolic dysfunction.    It is possible that she developed atrial flutter and as a result has lost her diastolic kick which has worsened her heart failure symptoms.  We will consider doing a transesophageal echo guided cardioversion later in the week.  We will restart her Xarelto tonight.  Will consider transesophageal echocardiogram assisted cardioversion in the next several days.  Suspect that she will be tuned up well enough to have this on Wednesday.  2.  Atrial flutter: Her rate is very well controlled.  Would restart Xarelto.  We will try to set up a transesophageal echo assisted cardioversion on Wednesday.    For questions or updates, please contact [place] Please consult [email or web address] for contact info under Cardiology/STEMI.   Signed, Mertie Moores, MD  06/20/2018 2:31 PM

## 2018-06-20 NOTE — H&P (View-Only) (Signed)
Cardiology Consultation:   Patient ID: BRIELYN Washington; [id number]; 03/04/1951   Admit date: 06/20/2018 Date of Consult: 06/20/2018  Primary Care Provider: Marda Stalker, PA-C Primary Cardiologist:   Marlou Porch  Primary Electrophysiologist:     Patient Profile:   Linda Washington is a 67 y.o. female with a hx of morbid obesity, type II but diabetes mellitus, hypertension, untreated sleep apnea, paroxysmal atrial fibrillation who is being seen today for the evaluation of atrial fibrillation and congestive heart failure at the request of  [facility] .  History of Present Illness:   Ms. Linda Washington is a 67 year old female who is a patient of Dr. Marlou Porch.  She has a history of paroxysmal atrial fibrillation.  She is been more more short of breath for the past several months.  Her shortness of breath acutely worsened over this weekend and she presented to the hospital for further evaluation.  She now presents with symptoms consistent with congestive heart pack failure.  She has marked shortness of breath and has PND and orthopnea.  She gets short of breath with exertion.  There is no real chest discomfort.  She is had leg swelling for the past several weeks.  She denies any syncope or presyncope.  She has had more fatigue than usual. She denies any cold symptoms she has had a cough that is probably related to her congestive heart failure.  Denies any fever..  She admits to eating some salty foods on occasion.  She eats fast food 2-3 times a week.  She eats bacon occasionally and also eats ham occasionally.  Echocardiogram from 2016 revealed normal left ventricular systolic function.  There was no mention of diastolic function.  She had trivial mitral regurgitation and trivial tricuspid regurgitation.    Past Medical History:  Diagnosis Date  . Anxiety    takes Citalopram daily  . Arthritis    generalized  . Basal cell carcinoma   . GERD (gastroesophageal reflux disease)    takes Omeprazole daily  .  History of Bell's palsy    left side  . History of bronchitis   . History of hiatal hernia   . History of kidney stones   . Hyperlipemia    takes Simvastatin daily  . Hypertension    takes Enalapril daily  . Hypothyroidism   . Incontinence    takes Ditropan daily  . Joint swelling    left knee  . Neuropathy    takes Gabapentin daily  . PONV (postoperative nausea and vomiting)   . RLS (restless legs syndrome)   . Seasonal allergies    takes Claritin daioy as needed  . Sleep apnea    pt. states that she does not have a CPAP  . Tremor   . Type 2 diabetes mellitus (HCC)    takes Metformin and Amaryl daily    Past Surgical History:  Procedure Laterality Date  . ACHILLES TENDON REPAIR Right   . APPENDECTOMY    . BASAL CELL CARCINOMA EXCISION     Nose  . BREAST LUMPECTOMY    . CARDIAC CATHETERIZATION     pt. states approximately 15 years ago  . CARPAL TUNNEL RELEASE Bilateral   . DILATION AND CURETTAGE OF UTERUS    . EYE SURGERY Bilateral    cataract surgery  . KNEE ARTHROSCOPY     Right  . LEFT HEART CATH AND CORONARY ANGIOGRAPHY N/A 02/01/2017   Procedure: Left Heart Cath and Coronary Angiography;  Surgeon: Lorretta Harp, MD;  Location: [facility]  INVASIVE CV LAB;  Service: Cardiovascular;  Laterality: N/A;  . LITHOTRIPSY  2013  . TOTAL ABDOMINAL HYSTERECTOMY    . TOTAL KNEE ARTHROPLASTY Left 04/08/2015   Procedure: LEFT TOTAL KNEE ARTHROPLASTY;  Surgeon: Frederik Pear, MD;  Location: [place];  Service: Orthopedics;  Laterality: Left;     Home Medications:  Prior to Admission medications   Medication Sig Start Date End Date Taking? Authorizing Provider  acetaminophen (TYLENOL) 650 MG CR tablet Take 1,300 mg by mouth daily as needed (for pain).   Yes [provider]  aspirin EC 81 MG tablet Take 1 tablet (81 mg total) by mouth daily. 04/16/16  Yes Jerline Pain, MD  atorvastatin (LIPITOR) 40 MG tablet Take 40 mg by mouth at bedtime.    Yes [provider]    cetirizine (ZYRTEC) 10 MG tablet Take 10 mg by mouth daily as needed (for allergies or allergic reactions).    Yes [provider]  citalopram (CELEXA) 20 MG tablet Take 20 mg by mouth daily.   Yes [provider]  enalapril (VASOTEC) 10 MG tablet Take 10 mg by mouth at bedtime.    Yes [provider]  fluticasone (FLONASE) 50 MCG/ACT nasal spray Place 1-2 sprays into both nostrils daily as needed for allergies or rhinitis.   Yes [provider]  glimepiride (AMARYL) 2 MG tablet Take 2 mg by mouth daily before breakfast.  05/09/18  Yes [provider]  metFORMIN (GLUCOPHAGE) 1000 MG tablet Take 1 tablet (1,000 mg total) by mouth 2 (two) times daily with a meal. Restart medication 48 hrs after discharge. Patient taking differently: Take 1,000 mg by mouth 2 (two) times daily with a meal.  02/01/17  Yes Duke, Tami Lin, PA  naproxen (NAPROSYN) 500 MG tablet Take 500 mg by mouth 2 (two) times daily. 06/15/18  Yes [provider]  omeprazole (PRILOSEC) 40 MG capsule Take 40 mg by mouth daily.    Yes [provider]  oxybutynin (DITROPAN XL) 15 MG 24 hr tablet Take 15 mg by mouth daily.   Yes [provider]  Polyethyl Glycol-Propyl Glycol (SYSTANE OP) Place 1-2 drops into both eyes daily.    Yes [provider]    Inpatient Medications: Scheduled Meds:  Continuous Infusions:  PRN Meds:   Allergies:    Allergies  Allergen Reactions  . Hydrocodone     Weird sensation  . Methocarbamol Hives  . Oxycodone     Weird sensation  . Latex Rash    Social History:   Social History   Socioeconomic History  . Marital status: Divorced    Spouse name: Not on file  . Number of children: Not on file  . Years of education: Some colle  . Highest education level: Not on file  Occupational History  . Occupation: Retired  Scientific laboratory technician  . Financial resource strain: Not on file  . Food insecurity:    Worry: Not on  file    Inability: Not on file  . Transportation needs:    Medical: Not on file    Non-medical: Not on file  Tobacco Use  . Smoking status: Never Smoker  . Smokeless tobacco: Never Used  Substance and Sexual Activity  . Alcohol use: Yes    Alcohol/week: 0.0 standard drinks    Comment: Rare-wine  . Drug use: No  . Sexual activity: Not on file  Lifestyle  . Physical activity:    Days per week: Not on file  Minutes per session: Not on file  . Stress: Not on file  Relationships  . Social connections:    Talks on phone: Not on file    Gets together: Not on file    Attends religious service: Not on file    Active member of club or organization: Not on file    Attends meetings of clubs or organizations: Not on file    Relationship status: Not on file  . Intimate partner violence:    Fear of current or ex partner: Not on file    Emotionally abused: Not on file    Physically abused: Not on file    Forced sexual activity: Not on file  Other Topics Concern  . Not on file  Social History Narrative   Lives at home with Lonn Georgia, roommate   Caffeine use: 1 Coffee day, 1-2 a day of tea, 1-2 sodas a day     Family History:    Family History  Problem Relation Age of Onset  . Heart disease Mother   . Heart disease Father   . Heart disease Brother 61     ROS:  Please see the history of present illness.   All other ROS reviewed and negative.     Physical Exam/Data:   Vitals:   06/20/18 1230 06/20/18 1245 06/20/18 1300 06/20/18 1400  BP: (!) 149/62 (!) 146/61 (!) 146/52 (!) 157/52  Pulse: 70 61 71 67  Resp: 16 18 (!) 23 (!) 22  Temp:      TempSrc:      SpO2: 99% 96% 95% 95%    Intake/Output Summary (Last 24 hours) at 06/20/2018 1431 Last data filed at 06/20/2018 1410 Gross per 24 hour  Intake -  Output 1200 ml  Net -1200 ml   There were no vitals filed for this visit. There is no height or weight on file to calculate BMI.  General: Obese, middle-aged female,  mild respiratory distress HEENT: normal Lymph: no adenopathy Neck: Neck.  Could not assess her JVD. Endocrine:  No thryomegaly Vascular: No carotid bruits; FA pulses 2+ bilaterally without bruits  Cardiac: Regular rate S1-S2.  No murmur appreciated. Lungs:  clear to auscultation bilaterally, no wheezing, rhonchi or rales  Abd: soft, nontender, no hepatomegaly  Ext: Trace to 1+ bilateral pitting edema. Musculoskeletal:  No deformities, BUE and BLE strength normal and equal Skin: warm and dry  Neuro:  CNs 2-12 intact, no focal abnormalities noted Psych:  Normal affect   EKG:  The EKG was personally reviewed and demonstrates: Atrial flutter with a ventricular rate of 70. Telemetry:  Telemetry was personally reviewed and demonstrates: Flutter with a controlled ventricular response.  Relevant CV Studies:   Laboratory Data:  Chemistry Recent Labs  Lab 06/20/18 1148  NA 140  K 4.1  CL 106  CO2 24  GLUCOSE 184*  BUN 19  CREATININE 0.95  CALCIUM 9.0  GFRNONAA 60*  GFRAA >60  ANIONGAP 10    No results for input(s): PROT, ALBUMIN, AST, ALT, ALKPHOS, BILITOT in the last 168 hours. Hematology Recent Labs  Lab 06/20/18 1148  WBC 8.1  RBC 3.92  HGB 10.9*  HCT 35.7*  MCV 91.1  MCH 27.8  MCHC 30.5  RDW 14.4  PLT 335   Cardiac EnzymesNo results for input(s): TROPONINI in the last 168 hours.  Recent Labs  Lab 06/20/18 1152  TROPIPOC 0.00    BNP Recent Labs  Lab 06/20/18 1255  BNP 111.9*    DDimer No  results for input(s): DDIMER in the last 168 hours.  Radiology/Studies:  Dg Chest 2 View  Result Date: 06/20/2018 CLINICAL DATA:  Patient complains of increasing lower extremity edema and SOB x 4 days. Denies CP. Has noticed a 3lb weight gain over the past 2 days. Was sent by [facility] for further evaluation of new onset CHF, hx: DM, HTN, BREAST LUMPECTOMY-BENIGN EXAM: CHEST - 2 VIEW COMPARISON:  02/01/2017 FINDINGS: The heart is enlarged. No focal consolidations or pleural  effusions. Mildly prominent interstitial markings are suspicious for interstitial edema given the new cardiomegaly. IMPRESSION: Cardiomegaly and suspected interstitial pulmonary edema. Electronically Signed   By: Nolon Nations M.D.   On: 06/20/2018 12:20    Assessment and Plan:   1. 1.  Acute congestive heart failure: The patient likely has acute diastolic congestive heart failure.  Her systolic function was normal with by echo in 2016.  She now presents with several weeks of worsening shortness of breath which acutely worsened over the past day or so.  She admits to eating some extra salty food recently.  Troponin levels are negative so far.  Her B natruretic peptide is only minimally elevated.  She is had a very nice response to Lasix IV 40 mg.  She is diuresed 2 L so far and is feeling better.  Agree with repeating the echocardiogram.  She had a left heart catheterization in April, 2018.  I do not think that she needs another ischemic work-up.  I suspect that she has diastolic dysfunction.    It is possible that she developed atrial flutter and as a result has lost her diastolic kick which has worsened her heart failure symptoms.  We will consider doing a transesophageal echo guided cardioversion later in the week.  We will restart her Xarelto tonight.  Will consider transesophageal echocardiogram assisted cardioversion in the next several days.  Suspect that she will be tuned up well enough to have this on Wednesday.  2.  Atrial flutter: Her rate is very well controlled.  Would restart Xarelto.  We will try to set up a transesophageal echo assisted cardioversion on Wednesday.    For questions or updates, please contact [place] Please consult [email or web address] for contact info under Cardiology/STEMI.   Signed, Mertie Moores, MD  06/20/2018 2:31 PM

## 2018-06-20 NOTE — ED Provider Notes (Signed)
[place] EMERGENCY DEPARTMENT Provider Note   CSN: [id number] Arrival date & time: 06/20/18  1104   History   Chief Complaint Chief Complaint  Patient presents with  . Shortness of Breath  . Leg Swelling    HPI Linda Washington is a 67 y.o. female.  The history is provided by the patient.  Shortness of Breath  This is a new problem. Episode onset: about a month ago. Progression since onset: worse for the last 4 days. Associated symptoms include cough and leg swelling. Pertinent negatives include no fever, no chest pain and no leg pain. She has tried nothing for the symptoms. Associated medical issues do not include asthma, COPD, pneumonia, PE, CAD, heart failure or DVT.  She has noticed increasing shortness of breath with activity.  He has noted significant lower extremity swelling.  Her shoes feel very tight. Patient went to an urgent care today.  She was sent to the ED for further evaluation  Past Medical History:  Diagnosis Date  . Anxiety    takes Citalopram daily  . Arthritis    generalized  . Basal cell carcinoma   . GERD (gastroesophageal reflux disease)    takes Omeprazole daily  . History of Bell's palsy    left side  . History of bronchitis   . History of hiatal hernia   . History of kidney stones   . Hyperlipemia    takes Simvastatin daily  . Hypertension    takes Enalapril daily  . Hypothyroidism   . Incontinence    takes Ditropan daily  . Joint swelling    left knee  . Neuropathy    takes Gabapentin daily  . PONV (postoperative nausea and vomiting)   . RLS (restless legs syndrome)   . Seasonal allergies    takes Claritin daioy as needed  . Sleep apnea    pt. states that she does not have a CPAP  . Tremor   . Type 2 diabetes mellitus (HCC)    takes Metformin and Amaryl daily    Patient Active Problem List   Diagnosis Date Noted  . Unstable angina ([place]) 02/01/2017  . Diabetes mellitus ([place]) 02/01/2017  . Arthritis of knee  04/08/2015  . Primary osteoarthritis of left knee 04/07/2015    Past Surgical History:  Procedure Laterality Date  . ACHILLES TENDON REPAIR Right   . APPENDECTOMY    . BASAL CELL CARCINOMA EXCISION     Nose  . BREAST LUMPECTOMY    . CARDIAC CATHETERIZATION     pt. states approximately 15 years ago  . CARPAL TUNNEL RELEASE Bilateral   . DILATION AND CURETTAGE OF UTERUS    . EYE SURGERY Bilateral    cataract surgery  . KNEE ARTHROSCOPY     Right  . LEFT HEART CATH AND CORONARY ANGIOGRAPHY N/A 02/01/2017   Procedure: Left Heart Cath and Coronary Angiography;  Surgeon: Lorretta Harp, MD;  Location: [place] [facility];  Service: Cardiovascular;  Laterality: N/A;  . LITHOTRIPSY  2013  . TOTAL ABDOMINAL HYSTERECTOMY    . TOTAL KNEE ARTHROPLASTY Left 04/08/2015   Procedure: LEFT TOTAL KNEE ARTHROPLASTY;  Surgeon: Frederik Pear, MD;  Location: [place];  Service: Orthopedics;  Laterality: Left;     OB History   None      Home Medications    Prior to Admission medications   Medication Sig Start Date End Date Taking? Authorizing Provider  aspirin EC 81 MG tablet Take 1 tablet (81  mg total) by mouth daily. 04/16/16   Jerline Pain, MD  atorvastatin (LIPITOR) 40 MG tablet Take 40 mg by mouth daily.    [provider]  citalopram (CELEXA) 20 MG tablet Take 20 mg by mouth daily.    [provider]  empagliflozin (JARDIANCE) 25 MG TABS tablet Take 25 mg by mouth daily.    [provider]  enalapril (VASOTEC) 10 MG tablet Take 10 mg by mouth at bedtime.     [provider]  fluticasone (FLONASE) 50 MCG/ACT nasal spray Place 1-2 sprays into both nostrils daily as needed for allergies or rhinitis.    [provider]  glimepiride (AMARYL) 2 MG tablet  05/09/18   [provider]  liraglutide (VICTOZA) 18 MG/3ML SOPN Inject 1.8 mg into the skin every morning.    [provider]  metFORMIN (GLUCOPHAGE) 1000 MG tablet Take 1 tablet (1,000  mg total) by mouth 2 (two) times daily with a meal. Restart medication 48 hrs after discharge. 02/01/17   [facility], Tami Lin, PA  NON FORMULARY Apply to eye. CHEMOTHERAPY NON FORMULARY    [provider]  omeprazole (PRILOSEC) 40 MG capsule Take 20 mg by mouth daily.     [provider]  oxybutynin (DITROPAN-XL) 10 MG 24 hr tablet Take 15 mg by mouth daily.     [provider]  Polyethyl Glycol-Propyl Glycol (SYSTANE OP) Apply 1-2 drops to eye daily as needed (dry eyes).    [provider]    Family History Family History  Problem Relation Age of Onset  . Heart disease Mother   . Heart disease Father   . Heart disease Brother 61    Social History Social History   Tobacco Use  . Smoking status: Never Smoker  . Smokeless tobacco: Never Used  Substance Use Topics  . Alcohol use: Yes    Alcohol/week: 0.0 standard drinks    Comment: Rare-wine  . Drug use: No     Allergies   Hydrocodone; Methocarbamol; Oxycodone; and Latex   Review of Systems Review of Systems  Constitutional: Negative for fever.  Respiratory: Positive for cough and shortness of breath.   Cardiovascular: Positive for leg swelling. Negative for chest pain.  All other systems reviewed and are negative.    Physical Exam Updated Vital Signs BP (!) 158/70 (BP Location: Right Arm)   Pulse 73   Temp 98.2 F (36.8 C) (Oral)   Resp 16   SpO2 93%   Physical Exam  Constitutional: She appears well-developed and well-nourished. No distress.  HENT:  Head: Normocephalic and atraumatic.  Right Ear: External ear normal.  Left Ear: External ear normal.  Eyes: Conjunctivae are normal. Right eye exhibits no discharge. Left eye exhibits no discharge. No scleral icterus.  Neck: Neck supple. No tracheal deviation present.  Cardiovascular: Normal rate, regular rhythm and intact distal pulses.  Pulmonary/Chest: Effort normal. No stridor. No respiratory distress. She has no wheezes. She  has rales.  Abdominal: Soft. Bowel sounds are normal. She exhibits no distension. There is no tenderness. There is no rebound and no guarding.  Musculoskeletal: She exhibits no tenderness.       Right lower leg: She exhibits edema.       Left lower leg: She exhibits edema.  Neurological: She is alert. She has normal strength. No cranial nerve deficit (no facial droop, extraocular movements intact, no slurred speech) or sensory deficit. She exhibits normal muscle tone. She displays no seizure activity. Coordination normal.  Skin: Skin is warm and dry. No rash noted.  Psychiatric: She has a normal mood and affect.  Nursing note and vitals reviewed.    ED Treatments / Results  Labs (all labs ordered are listed, but only abnormal results are displayed) Labs Reviewed  BASIC METABOLIC PANEL - Abnormal; Notable for the following components:      Result Value   Glucose, Bld 184 (*)    GFR calc non Af Amer 60 (*)    All other components within normal limits  CBC - Abnormal; Notable for the following components:   Hemoglobin 10.9 (*)    HCT 35.7 (*)    All other components within normal limits  BRAIN NATRIURETIC PEPTIDE  MAGNESIUM  TSH  I-STAT TROPONIN, ED    EKG EKG Interpretation  Date/Time:  Monday June 20 2018 11:31:17 EDT Ventricular Rate:  70 PR Interval:    QRS Duration: 90 QT Interval:  392 QTC Calculation: 423 R Axis:   10 Text Interpretation:  Atrial flutter with variable A-V block Low voltage QRS Nonspecific T wave abnormality Abnormal ECG atrial flutter is new since previous tracing Confirmed by Dorie Rank [phone]) on 06/20/2018 12:33:49 PM   Radiology Dg Chest 2 View  Result Date: 06/20/2018 CLINICAL DATA:  Patient complains of increasing lower extremity edema and SOB x 4 days. Denies CP. Has noticed a 3lb weight gain over the past 2 days. Was sent by [facility] for further evaluation of new onset CHF, hx: DM, HTN, BREAST LUMPECTOMY-BENIGN EXAM: CHEST - 2 VIEW COMPARISON:   02/01/2017 FINDINGS: The heart is enlarged. No focal consolidations or pleural effusions. Mildly prominent interstitial markings are suspicious for interstitial edema given the new cardiomegaly. IMPRESSION: Cardiomegaly and suspected interstitial pulmonary edema. Electronically Signed   By: Nolon Nations M.D.   On: 06/20/2018 12:20    Procedures Procedures (including critical care time)  Medications Ordered in ED Medications  furosemide (LASIX) injection 40 mg (has no administration in time range)     Initial Impression / Assessment and Plan / ED Course  I have reviewed the triage vital signs and the nursing notes.  Pertinent labs & imaging results that were available during my care of the patient were reviewed by me and considered in my medical decision making (see chart for details).  This patients CHA2DS2-VASc Score and unadjusted Ischemic Stroke Rate (% per year) is equal to 7.2 % stroke rate/year from a score of 5  Above score calculated as 1 point each if present [CHF, HTN, DM, Vascular=MI/PAD/Aortic Plaque, Age if 65-74, or Female] Above score calculated as 2 points each if present [Age > 75, or Stroke/TIA/TE]   Clinical Course as of Jun 20 1249  Mon Jun 20, 2018  1247 Patient's x-ray shows interstitial edema.  Her EKG is notable for atrial flutter which appears to be new.   [JK]  1248 We will add on BNP, thyroid studies.  Patient does not appear to be any significant distress however this is new onset congestive heart failure as well as new onset atrial flutter.  I will consult with cardiology to discuss admission for further work-up.   [JK]    Clinical Course User Index [JK] Dorie Rank, MD    Presents with symptoms suggestive of acute congestive heart failure.  Patient's EKG is also notable for atrial flutter which is a new diagnosis for her.  I have ordered a dose of Lasix.  I will consult with cardiology medical service for admission and further work-up.  Final  Clinical Impressions(s) / ED Diagnoses   Final diagnoses:  Acute congestive heart failure, unspecified heart failure type [facility])  Atrial flutter with controlled response [facility])    ED Discharge Orders    None       Dorie Rank, MD 06/20/18 1251

## 2018-06-20 NOTE — Progress Notes (Signed)
ANTICOAGULATION CONSULT NOTE - Initial Consult  Pharmacy Consult for Xarelto Indication: atrial fibrillation  Allergies  Allergen Reactions  . Hydrocodone Other (See Comments)    Weird sensations mentally; "makes me fuzzy"  . Methocarbamol Hives  . Oxycodone Other (See Comments)    Weird sensations mentally; "makes me fuzzy"  . Latex Hives and Rash    Patient Measurements:    Vital Signs: Temp: 98.2 F (36.8 C) (09/02 1128) Temp Source: Oral (09/02 1128) BP: 153/55 (09/02 1430) Pulse Rate: 69 (09/02 1430)  Labs: Recent Labs    06/20/18 1148  HGB 10.9*  HCT 35.7*  PLT 335  CREATININE 0.95    CrCl cannot be calculated (Unknown ideal weight.).   Medical History: Past Medical History:  Diagnosis Date  . Anxiety    takes Citalopram daily  . Arthritis    generalized  . Basal cell carcinoma   . GERD (gastroesophageal reflux disease)    takes Omeprazole daily  . History of Bell's palsy    left side  . History of bronchitis   . History of hiatal hernia   . History of kidney stones   . Hyperlipemia    takes Simvastatin daily  . Hypertension    takes Enalapril daily  . Hypothyroidism   . Incontinence    takes Ditropan daily  . Joint swelling    left knee  . Neuropathy    takes Gabapentin daily  . PONV (postoperative nausea and vomiting)   . RLS (restless legs syndrome)   . Seasonal allergies    takes Claritin daioy as needed  . Sleep apnea    pt. states that she does not have a CPAP  . Tremor   . Type 2 diabetes mellitus (HCC)    takes Metformin and Amaryl daily     Assessment: 30 yoF admitted with AHF and AFib. Pt on no OAC PTA but was previously on Xarelto, pharmacy consulted to resume.  Goal of Therapy:  Monitor platelets by anticoagulation protocol: Yes   Plan:  Xarelto 20mg  daily with supper Pharmacy will sign off, reconsult if needed  Arrie Senate, PharmD, BCPS Clinical Pharmacist [phone] Please check AMION for all [facility]  numbers 06/20/2018

## 2018-06-20 NOTE — H&P (Signed)
History and Physical    AXEL MEAS WGN:[id number] DOB: 18-Mar-1951 DOA: 06/20/2018  PCP: Marda Stalker, PA-C Consultants:  Marlou Porch - cardiology; Buddy Duty - endocrinology; Prudence Davidson - podiatry Patient coming from:  Home - lives alone; NOK: Sister or sister-in-law, [phone]  Chief Complaint: Edema and SOB  HPI: Linda Washington is a 67 y.o. female with medical history significant of DM; OSA not on CPAP; post-operative atrial flutter (previously on Xarelto); hypothyroidism; HTN; and HLD presenting with LE edema and SOB.  SOB, hard to walk, swelling, and just coughing every so often, just feeling miserable, feeling like I've been carrying a ton of water around with me.  Her symptoms stared about a month ago but they have progressed over the last 3 days.  +cough, productive of occasional clear or green phlegm.  No fevers.  +orthopnea - usually sleeps on 2 pillows but has been sleeping in a chair the last few days.  No PND.  She has gained 3 pounds since last Thursday, but possibly 40 pound total weight gain.  Atrial flutter was diagnosed during a remote pre-op visit but she has not had it since then.  She has been off Xarelto for several years.   ED Course:  New-onset CHF - cardiology consult pending.  Denies h/o heart disease or CHF.  LE edema x month, worse this week.  Cough, SOB.  Went to PCP today and sent to ER for more evaluation.  O2 sats ok.  BNP 111.  Normal troponin.  CHF with CM and edema.  Marked peripheral edema with crackles on PE.  Atrial flutter (new) on EKG.    Review of Systems: As per HPI; otherwise review of systems reviewed and negative.   Ambulatory Status:  Ambulates without assistance  Past Medical History:  Diagnosis Date  . Acute on chronic diastolic (congestive) heart failure ([place]) 06/2018  . Anxiety    takes Citalopram daily  . Arthritis    generalized  . Basal cell carcinoma   . GERD (gastroesophageal reflux disease)    takes Omeprazole daily  . History of Bell's palsy     left side  . History of bronchitis   . History of hiatal hernia   . History of kidney stones   . Hyperlipemia    takes Simvastatin daily  . Hypertension    takes Enalapril daily  . Hypothyroidism   . Incontinence    takes Ditropan daily  . Joint swelling    left knee  . Neuropathy    takes Gabapentin daily  . PONV (postoperative nausea and vomiting)   . RLS (restless legs syndrome)   . Seasonal allergies    takes Claritin daioy as needed  . Sleep apnea    pt. states that she does not have a CPAP  . Tremor   . Type 2 diabetes mellitus (HCC)    takes Metformin and Amaryl daily    Past Surgical History:  Procedure Laterality Date  . ACHILLES TENDON REPAIR Right   . APPENDECTOMY    . BASAL CELL CARCINOMA EXCISION     Nose  . BREAST LUMPECTOMY    . CARDIAC CATHETERIZATION     pt. states approximately 15 years ago  . CARPAL TUNNEL RELEASE Bilateral   . DILATION AND CURETTAGE OF UTERUS    . EYE SURGERY Bilateral    cataract surgery  . KNEE ARTHROSCOPY     Right  . LEFT HEART CATH AND CORONARY ANGIOGRAPHY N/A 02/01/2017   Procedure: Left Heart Cath  and Coronary Angiography;  Surgeon: Lorretta Harp, MD;  Location: [place] [facility];  Service: Cardiovascular;  Laterality: N/A;  . LITHOTRIPSY  2013  . TOTAL ABDOMINAL HYSTERECTOMY    . TOTAL KNEE ARTHROPLASTY Left 04/08/2015   Procedure: LEFT TOTAL KNEE ARTHROPLASTY;  Surgeon: Frederik Pear, MD;  Location: [place];  Service: Orthopedics;  Laterality: Left;    Social History   Socioeconomic History  . Marital status: Divorced    Spouse name: Not on file  . Number of children: Not on file  . Years of education: Some colle  . Highest education level: Not on file  Occupational History  . Occupation: part-time Research scientist (physical sciences) at [employer]  . Financial resource strain: Not on file  . Food insecurity:    Worry: Not on file    Inability: Not on file  . Transportation needs:    Medical: Not on file     Non-medical: Not on file  Tobacco Use  . Smoking status: Never Smoker  . Smokeless tobacco: Never Used  Substance and Sexual Activity  . Alcohol use: Yes    Alcohol/week: 0.0 standard drinks    Comment: Rare-wine  . Drug use: No  . Sexual activity: Not on file  Lifestyle  . Physical activity:    Days per week: Not on file    Minutes per session: Not on file  . Stress: Not on file  Relationships  . Social connections:    Talks on phone: Not on file    Gets together: Not on file    Attends religious service: Not on file    Active member of club or organization: Not on file    Attends meetings of clubs or organizations: Not on file    Relationship status: Not on file  . Intimate partner violence:    Fear of current or ex partner: Not on file    Emotionally abused: Not on file    Physically abused: Not on file    Forced sexual activity: Not on file  Other Topics Concern  . Not on file  Social History Narrative   Lives at home with Lonn Georgia, roommate   Caffeine use: 1 Coffee day, 1-2 a day of tea, 1-2 sodas a day     Allergies  Allergen Reactions  . Hydrocodone Other (See Comments)    Weird sensations mentally; "makes me fuzzy"  . Methocarbamol Hives  . Oxycodone Other (See Comments)    Weird sensations mentally; "makes me fuzzy"  . Latex Hives and Rash    Family History  Problem Relation Age of Onset  . Heart disease Mother   . Heart failure Mother 71  . Heart disease Father   . Heart failure Father   . Heart disease Brother 46  . CAD Brother 38    Prior to Admission medications   Medication Sig Start Date End Date Taking? Authorizing Provider  aspirin EC 81 MG tablet Take 1 tablet (81 mg total) by mouth daily. 04/16/16   Jerline Pain, MD  atorvastatin (LIPITOR) 40 MG tablet Take 40 mg by mouth daily.    [provider]  citalopram (CELEXA) 20 MG tablet Take 20 mg by mouth daily.    [provider]  empagliflozin (JARDIANCE) 25 MG TABS  tablet Take 25 mg by mouth daily.    [provider]  enalapril (VASOTEC) 10 MG tablet Take 10 mg by mouth at bedtime.     [provider]  fluticasone (FLONASE) 50 MCG/ACT nasal spray Place 1-2 sprays into both nostrils daily as needed for allergies or rhinitis.    [provider]  glimepiride (AMARYL) 2 MG tablet  05/09/18   [provider]  liraglutide (VICTOZA) 18 MG/3ML SOPN Inject 1.8 mg into the skin every morning.    [provider]  metFORMIN (GLUCOPHAGE) 1000 MG tablet Take 1 tablet (1,000 mg total) by mouth 2 (two) times daily with a meal. Restart medication 48 hrs after discharge. 02/01/17   [facility], Tami Lin, PA  NON FORMULARY Apply to eye. CHEMOTHERAPY NON FORMULARY    [provider]  omeprazole (PRILOSEC) 40 MG capsule Take 20 mg by mouth daily.     [provider]  oxybutynin (DITROPAN-XL) 10 MG 24 hr tablet Take 15 mg by mouth daily.     [provider]  Polyethyl Glycol-Propyl Glycol (SYSTANE OP) Apply 1-2 drops to eye daily as needed (dry eyes).    [provider]    Physical Exam: Vitals:   06/20/18 1400 06/20/18 1430 06/20/18 1600 06/20/18 1643  BP: (!) 157/52 (!) 153/55 (!) 143/60 (!) 177/96  Pulse: 67 69 68 70  Resp: (!) 22 13 16 18   Temp:    97.7 F (36.5 C)  TempSrc:    Oral  SpO2: 95% 95% 92% 98%     General:  Appears calm and comfortable and is NAD; she has already voided >1L and voided again while I was in the room Eyes:  PERRL, EOMI, normal lids, iris ENT:  grossly normal hearing, lips & tongue, mmm; moderately poor dentition Neck:  no LAD, masses or thyromegaly; no carotid bruits Cardiovascular:  Irregularly irregular, rate controlled, no m/r/g.  Respiratory:   CTA bilaterally with no wheezes/rales/rhonchi.  Normal to slightly increased respiratory effort. Abdomen:  soft, NT, ND, NABS Back:   normal alignment, no CVAT Skin:  no rash or induration seen on limited  exam Musculoskeletal:  grossly normal tone BUE/BLE, good ROM, no bony abnormality Lower extremity: 2-3+ No LE edema.  Limited foot exam with no ulcerations.  2+ distal pulses. Psychiatric:  grossly normal mood and affect, speech fluent and appropriate, AOx3 Neurologic:  CN 2-12 grossly intact, moves all extremities in coordinated fashion, sensation intact    Radiological Exams on Admission: Dg Chest 2 View  Result Date: 06/20/2018 CLINICAL DATA:  Patient complains of increasing lower extremity edema and SOB x 4 days. Denies CP. Has noticed a 3lb weight gain over the past 2 days. Was sent by [facility] for further evaluation of new onset CHF, hx: DM, HTN, BREAST LUMPECTOMY-BENIGN EXAM: CHEST - 2 VIEW COMPARISON:  02/01/2017 FINDINGS: The heart is enlarged. No focal consolidations or pleural effusions. Mildly prominent interstitial markings are suspicious for interstitial edema given the new cardiomegaly. IMPRESSION: Cardiomegaly and suspected interstitial pulmonary edema. Electronically Signed   By: Nolon Nations M.D.   On: 06/20/2018 12:20    EKG: Independently reviewed.  Atrial flutter with rate 70, new since last tracing  Labs on Admission: I have personally reviewed the available labs and imaging studies at the time of the admission.  Pertinent labs:   Glucose 184 BNP 111.9 Troponin 0.00 WBC 8.1 Hgb 10.9 TSH 1.152 Troponin 0.00 Negative cath in 4/18  Assessment/Plan Principal Problem:   Acute on chronic diastolic CHF (congestive heart failure) ([place]) Active Problems:   Diabetes mellitus ([place])   Essential hypertension   Dyslipidemia   OSA (obstructive sleep apnea)   Atrial flutter ([place])  Diastolic CHF -Patient without known CHF presenting with worsening SOB and marked LE edema -CXR consistent with cardiomegaly and pulmonary edema -Minimally elevated BNP -Patient appears to have rate-controlled atrial flutter on telemetry, which is new for her (she had one prior remote  episode) -With symptoms and abnl CXR, new-onset CHF probable as diagnosis; given her normal heart cath in 4/18, this appears likely to be diastolic CHF -Will admit with telemetry -Will repeat echocardiogram  -Will continue ASA -Will continue enalapril -Will start low-dose Coreg -CHF order set utilized -Cardiology consult -Was given Lasix 40 mg x 1 in ER and will repeat with 40 mg IV BID -Continue prn [place] O2 for now -Repeat EKG in AM  Atrial flutter -Also with new(ish) onset flutter -She is rate controlled -She is tentatively planned for electrical cardioversion on Wednesday -Will resume Xarelto (she took this remotely)  DM -A1c 8.0, indicating suboptimal but not poor control -hold Glucophage, Amaryl -Cover with moderate-scale SSI  HTN -Continue ACE -Add BB  HLD -Continue Lipitor  OSA -Does not wear CPAP    DVT prophylaxis: Xarelto Code Status:  DNR - confirmed with patient/family Family Communication: Sister-in-law present throughout evaluation  Disposition Plan:  Home once clinically improved Consults called: Cardiology  Admission status: Admit - It is my clinical opinion that admission to [place] is reasonable and necessary because of the expectation that this patient will require hospital care that crosses at least 2 midnights to treat this condition based on the medical complexity of the problems presented.  Given the aforementioned information, the predictability of an adverse outcome is felt to be significant.    Karmen Bongo MD [facility]  If note is complete, please contact covering daytime or nighttime physician. [email or web address] Password [facility]  06/20/2018, 5:57 PM

## 2018-06-20 NOTE — ED Notes (Signed)
Pt given [place] sandwich and diet coke, ok by EDP

## 2018-06-20 NOTE — ED Triage Notes (Signed)
Patient complains of increasing lower extremity edema and SOB x 4 days. Denies  CP. Has noticed a 3lb weight gain over the past 2 days. Was sent by [facility] for further evaluation of new onset CHF

## 2018-06-21 ENCOUNTER — Other Ambulatory Visit (HOSPITAL_COMMUNITY): Payer: Medicare HMO

## 2018-06-21 LAB — BASIC METABOLIC PANEL
ANION GAP: 9 (ref 5–15)
BUN: 18 mg/dL (ref 8–23)
CHLORIDE: 101 mmol/L (ref 98–111)
CO2: 31 mmol/L (ref 22–32)
CREATININE: 0.95 mg/dL (ref 0.44–1.00)
Calcium: 9.2 mg/dL (ref 8.9–10.3)
GFR calc non Af Amer: 60 mL/min — ABNORMAL LOW (ref >60)
Glucose, Bld: 141 mg/dL — ABNORMAL HIGH (ref 70–99)
POTASSIUM: 3.7 mmol/L (ref 3.5–5.1)
SODIUM: 141 mmol/L (ref 135–145)

## 2018-06-21 LAB — GLUCOSE, CAPILLARY
GLUCOSE-CAPILLARY: 158 mg/dL — AB (ref 70–99)
Glucose-Capillary: 141 mg/dL — ABNORMAL HIGH (ref 70–99)
Glucose-Capillary: 167 mg/dL — ABNORMAL HIGH (ref 70–99)

## 2018-06-21 LAB — CBC WITH DIFFERENTIAL/PLATELET
ABS IMMATURE GRANULOCYTES: 0 10*3/uL (ref 0.0–0.1)
Basophils Absolute: 0.1 10*3/uL (ref 0.0–0.1)
Basophils Relative: 1 %
EOS ABS: 0.3 10*3/uL (ref 0.0–0.7)
Eosinophils Relative: 4 %
HEMATOCRIT: 37.7 % (ref 36.0–46.0)
HEMOGLOBIN: 11.8 g/dL — AB (ref 12.0–15.0)
IMMATURE GRANULOCYTES: 0 %
LYMPHS ABS: 1.9 10*3/uL (ref 0.7–4.0)
LYMPHS PCT: 25 %
MCH: 28 pg (ref 26.0–34.0)
MCHC: 31.3 g/dL (ref 30.0–36.0)
MCV: 89.5 fL (ref 78.0–100.0)
MONOS PCT: 10 %
Monocytes Absolute: 0.7 10*3/uL (ref 0.1–1.0)
NEUTROS ABS: 4.4 10*3/uL (ref 1.7–7.7)
NEUTROS PCT: 60 %
Platelets: 343 10*3/uL (ref 150–400)
RBC: 4.21 MIL/uL (ref 3.87–5.11)
RDW: 14.3 % (ref 11.5–15.5)
WBC: 7.4 10*3/uL (ref 4.0–10.5)

## 2018-06-21 LAB — HIV ANTIBODY (ROUTINE TESTING W REFLEX): HIV Screen 4th Generation wRfx: NONREACTIVE

## 2018-06-21 MED ORDER — POTASSIUM CHLORIDE CRYS ER 20 MEQ PO TBCR
40.0000 meq | EXTENDED_RELEASE_TABLET | Freq: Once | ORAL | Status: AC
  Administered 2018-06-21: 40 meq via ORAL
  Filled 2018-06-21: qty 2

## 2018-06-21 NOTE — Evaluation (Signed)
Physical Therapy Evaluation Patient Details Name: Linda Washington MRN: [id number] DOB: [birth year] Today's Date: 06/21/2018   History of Present Illness  67yo female c/o of LE edema and SOB. Diagnosed with acute on chronic CHF, A-flutter. PMH anxiety, hx bells palsy, HTN, hypothyroidism, incontinence, neuropathy, DM, R achilles tendon repair, R knee arthroscopy, L TKA   Clinical Impression   Patient received in bed, very pleasant and willing to participate in PT session today. She is able to complete all functional bed mobility, functional transfers, and gait with no device and on a Mod(I) basis without difficulty. Note mild SOB with gait, but patient easily able to maintain conversation. Able to hold SLS for 3 seconds B, tandem stance for 30 seconds B. She was left up in the chair with all needs met this morning. She does not appear to be in need of skilled PT services during her hospital stay or moving forward as she is generally at her baseline level of mobility. PT signing off, thank you for the referral.     Follow Up Recommendations No PT follow up    Equipment Recommendations  None recommended by PT    Recommendations for Other Services Other (comment)(mobility tech )     Precautions / Restrictions Precautions Precautions: None Restrictions Weight Bearing Restrictions: No      Mobility  Bed Mobility Overal bed mobility: Modified Independent                Transfers Overall transfer level: Modified independent               General transfer comment: sit to stand, stand pivot transfers no device, Mod(I)   Ambulation/Gait Ambulation/Gait assistance: Modified independent (Device/Increase time) Gait Distance (Feet): 400 Feet Assistive device: None Gait Pattern/deviations: WFL(Within Functional Limits)     General Gait Details: no significant gait deviations noted, very mild SOB but patient able to [employer] conversation easily   Chief Strategy Officer    Modified Rankin (Stroke Patients Only)       Balance Overall balance assessment: Modified Independent                                           Pertinent Vitals/Pain Pain Assessment: No/denies pain    Home Living Family/patient expects to be discharged to:: Private residence Living Arrangements: Alone   Type of Home: House Home Access: Stairs to enter Entrance Stairs-Rails: Right Entrance Stairs-Number of Steps: 4 Home Layout: One level Home Equipment: None      Prior Function Level of Independence: Independent               Hand Dominance        Extremity/Trunk Assessment   Upper Extremity Assessment Upper Extremity Assessment: Overall WFL for tasks assessed    Lower Extremity Assessment Lower Extremity Assessment: Overall WFL for tasks assessed    Cervical / Trunk Assessment Cervical / Trunk Assessment: Normal  Communication   Communication: No difficulties  Cognition Arousal/Alertness: Awake/alert Behavior During Therapy: WFL for tasks assessed/performed Overall Cognitive Status: Within Functional Limits for tasks assessed                                        General Comments General comments (  skin integrity, edema, etc.): SLS 3 seconds B, tandem stance 30 seconds B     Exercises     Assessment/Plan    PT Assessment Patent does not need any further PT services  PT Problem List Decreased activity tolerance;Decreased balance       PT Treatment Interventions      PT Goals (Current goals can be found in the Care Plan section)  Acute Rehab PT Goals Patient Stated Goal: to go home  PT Goal Formulation: With patient Time For Goal Achievement: 07/05/18 Potential to Achieve Goals: Good    Frequency Other (Comment)(eval only )   Barriers to discharge        Co-evaluation               AM-PAC PT "6 Clicks" Daily Activity  Outcome Measure Difficulty turning over in  bed (including adjusting bedclothes, sheets and blankets)?: None Difficulty moving from lying on back to sitting on the side of the bed? : None Difficulty sitting down on and standing up from a chair with arms (e.g., wheelchair, bedside commode, etc,.)?: None Help needed moving to and from a bed to chair (including a wheelchair)?: None Help needed walking in hospital room?: None Help needed climbing 3-5 steps with a railing? : None 6 Click Score: 24    End of Session   Activity Tolerance: Patient tolerated treatment well Patient left: in chair   PT Visit Diagnosis: Muscle weakness (generalized) (M62.81)    Time: 1047-1100 PT Time Calculation (min) (ACUTE ONLY): 13 min   Charges:   PT Evaluation $PT Eval Low Complexity: 1 Low          Deniece Ree PT, DPT, CBIS  Supplemental Physical Therapist [place]   Pager [phone]

## 2018-06-21 NOTE — Progress Notes (Signed)
Progress Note  Patient Name: Linda Washington Date of Encounter: 06/21/2018  Primary Cardiologist:   Candee Furbish, MD   Subjective   She is breathing much better.  No pain.  Ambulating in the room.   Inpatient Medications    Scheduled Meds: . aspirin EC  81 mg Oral Daily  . atorvastatin  40 mg Oral QHS  . carvedilol  3.125 mg Oral BID WC  . citalopram  20 mg Oral Daily  . enalapril  10 mg Oral QHS  . furosemide  40 mg Intravenous Q12H  . insulin aspart  0-15 Units Subcutaneous TID WC  . insulin aspart  0-5 Units Subcutaneous QHS  . oxybutynin  15 mg Oral Daily  . pantoprazole  40 mg Oral Daily  . polyvinyl alcohol   Both Eyes Daily  . rivaroxaban  20 mg Oral Q supper  . sodium chloride flush  3 mL Intravenous Q12H   Continuous Infusions: . sodium chloride     PRN Meds: sodium chloride, acetaminophen, ondansetron (ZOFRAN) IV, sodium chloride flush   Vital Signs    Vitals:   06/20/18 2154 06/21/18 0003 06/21/18 0357 06/21/18 0920  BP: (!) 146/59 (!) 100/49 (!) 103/53 (!) 124/56  Pulse: 68 69 69 68  Resp:  18 18 20   Temp:  98 F (36.7 C) 97.9 F (36.6 C) 98.1 F (36.7 C)  TempSrc:  Oral Oral Oral  SpO2:  94% 95% 94%  Weight:   (!) 136.1 kg   Height:        Intake/Output Summary (Last 24 hours) at 06/21/2018 1019 Last data filed at 06/21/2018 0358 Gross per 24 hour  Intake -  Output 6101 ml  Net -6101 ml   Filed Weights   06/20/18 1643 06/21/18 0357  Weight: (!) 139.5 kg (!) 136.1 kg    Telemetry    Atrial flutter with variable conduction.  - Personally Reviewed  ECG    NA - Personally Reviewed  Physical Exam   GEN: No acute distress.   Neck: No  JVD Cardiac: Irregular RR, no murmurs, rubs, or gallops.  Respiratory: Clear  to auscultation bilaterally. GI: Soft, nontender, non-distended  MS:    Trace edema; No deformity. Neuro:  Nonfocal  Psych: Normal affect   Labs    Chemistry Recent Labs  Lab 06/20/18 1148 06/21/18 0627  NA 140 141  K  4.1 3.7  CL 106 101  CO2 24 31  GLUCOSE 184* 141*  BUN 19 18  CREATININE 0.95 0.95  CALCIUM 9.0 9.2  GFRNONAA 60* 60*  GFRAA >60 >60  ANIONGAP 10 9     Hematology Recent Labs  Lab 06/20/18 1148 06/21/18 0627  WBC 8.1 7.4  RBC 3.92 4.21  HGB 10.9* 11.8*  HCT 35.7* 37.7  MCV 91.1 89.5  MCH 27.8 28.0  MCHC 30.5 31.3  RDW 14.4 14.3  PLT 335 343    Cardiac EnzymesNo results for input(s): TROPONINI in the last 168 hours.  Recent Labs  Lab 06/20/18 1152  TROPIPOC 0.00     BNP Recent Labs  Lab 06/20/18 1255  BNP 111.9*     DDimer No results for input(s): DDIMER in the last 168 hours.   Radiology    Dg Chest 2 View  Result Date: 06/20/2018 CLINICAL DATA:  Patient complains of increasing lower extremity edema and SOB x 4 days. Denies CP. Has noticed a 3lb weight gain over the past 2 days. Was sent by [facility] for further evaluation of  new onset CHF, hx: DM, HTN, BREAST LUMPECTOMY-BENIGN EXAM: CHEST - 2 VIEW COMPARISON:  02/01/2017 FINDINGS: The heart is enlarged. No focal consolidations or pleural effusions. Mildly prominent interstitial markings are suspicious for interstitial edema given the new cardiomegaly. IMPRESSION: Cardiomegaly and suspected interstitial pulmonary edema. Electronically Signed   By: Nolon Nations M.D.   On: 06/20/2018 12:20    Cardiac Studies   Echo:  Pending   Patient Profile     67 y.o. female with a hx of morbid obesity, type II but diabetes mellitus, hypertension, untreated sleep apnea, paroxysmal atrial fibrillation who is being seen for the evaluation of atrial fibrillation and congestive heart failure at the request of  [facility] .  Assessment & Plan    ACUTE ON CHRONIC DIASTOLIC HF:  Down 6 liters since admission.  Weight down 3 kg.   She still has some crackles so I will continue her IV diuresis.    ATRIAL FLUTTER:  TEE/DCCV tomorrow.     For questions or updates, please contact [place] Please consult [email or web address] for contact  info under Cardiology/STEMI.   Signed, Minus Breeding, MD  06/21/2018, 10:19 AM

## 2018-06-21 NOTE — Plan of Care (Signed)
  Problem: Activity: Goal: Risk for activity intolerance will decrease Outcome: Progressing   Problem: Nutrition: Goal: Adequate nutrition will be maintained Outcome: Progressing   Problem: Coping: Goal: Level of anxiety will decrease Outcome: Progressing   Problem: Pain Managment: Goal: General experience of comfort will improve Outcome: Progressing   Problem: Education: Goal: Ability to demonstrate management of disease process will improve Outcome: Progressing   Problem: Activity: Goal: Capacity to carry out activities will improve Outcome: Progressing

## 2018-06-21 NOTE — Progress Notes (Signed)
PROGRESS NOTE    Linda Washington  XIH:[id number] DOB: 01-Feb-1951 DOA: 06/20/2018 PCP: Marda Stalker, PA-C    Brief Narrative:  67 year old female who presented with dyspnea and edema.  She does have the significant past medical history for type 2 diabetes mellitus, sleep apnea, atrial flutter, hypothyroidism, hypertension and dyslipidemia.  Reported 4 weeks of worsening symptoms, more severe over the last 3 days prior to hospitalization, severe lower extremity edema, unable to ambulate, positive orthopnea, weight gain, (40 pounds), no PND or chest pain.  On her initial physical examination blood pressure 157/52, heart rate 67, respiratory rate 22, temperature 97.7, oxygen saturation 92%.  Heart S1-S2 present, irregularly irregular, lungs with no wheezing, no rhonchi, but increase work of breathing, soft nontender, lower extremities with 3+ pitting edema.  Sodium 140, potassium 4.1, chloride 106, bicarb 24, glucose 184, BUN 19, creatinine 0.95, magnesium 1.6, BNP 111, white count 9.1, hemoglobin 10.9, hematocrit 35.7, platelets 335.  Chest x-ray with cardiomegaly, positive bilateral increased interstitial markings and vascular congestion.  EKG with typical atrial flutter, rate of 69 bpm, normal axis.  Patient was admitted to the hospital with working diagnosis of decompensated diastolic heart failure.  Assessment & Plan:   Principal Problem:   Acute on chronic diastolic CHF (congestive heart failure) (HCC) Active Problems:   Diabetes mellitus (HCC)   Essential hypertension   Dyslipidemia   OSA (obstructive sleep apnea)   Atrial flutter ([place])   1. Decompensated diastolic heart failure. Will continue aggressive diuresis with IV furosemide, urine output over last 24 hours 6,101 ml, with improvement of her symptoms but not yet back to baseline. Will continue carvedilol and enalapril. Last echocardiogram in 2016 had preserved LV systolic function with EF 55 to 60% (old records personally reviewed).  Corrext hypokalemia with kcl, check mg in am.    2. Typical atrial flutter. Suspected to be the culprit of heart failure decompensation. Telemetry patient has remained in atrial flutter, will continue rate control with carvedilol. HR has remained less than 100 bmp. Continue anticoagulation with rivaroxaban. Patient will have electrical cardioversion per cardiology to aim sinus rhythm. Follow on echocardiogram.   3. T2DM. Continue glucose cover and monitoring with insulin sliding scale, patient is tolerating po well. Capillary glucose 119, 158.   4. Dyslipidemia. Continue statin therapy.   5. Morbid obesity. Calculated BMI 45.6. Will need outpatient follow up and likely polysomnography.   6. HTN. Continue blood pressure control with enalapril.   DVT prophylaxis: rivaroxaban   Code Status:  full Family Communication: no family at the bedside  Disposition Plan/ discharge barriers: pending clinical improvement    Consultants:   Cardiology   Procedures:     Antimicrobials:       Subjective: Dyspnea has improved along with edema, but not yet back to baseline, no nausea or vomiting, no chest pain.   Objective: Vitals:   06/20/18 2154 06/21/18 0003 06/21/18 0357 06/21/18 0920  BP: (!) 146/59 (!) 100/49 (!) 103/53 (!) 124/56  Pulse: 68 69 69 68  Resp:  18 18 20   Temp:  98 F (36.7 C) 97.9 F (36.6 C) 98.1 F (36.7 C)  TempSrc:  Oral Oral Oral  SpO2:  94% 95% 94%  Weight:   (!) 136.1 kg   Height:        Intake/Output Summary (Last 24 hours) at 06/21/2018 1020 Last data filed at 06/21/2018 0358 Gross per 24 hour  Intake -  Output 6101 ml  Net -6101 ml   [employer]  06/20/18 1643 06/21/18 0357  Weight: (!) 139.5 kg (!) 136.1 kg    Examination:   General: Not in pain or dyspnea, deconditioned  Neurology: Awake and alert, non focal  E ENT: mild pallor, no icterus, oral mucosa moist Cardiovascular: No JVD. Distant S1-S2 present, rhythmic, no gallops, rubs, or  murmurs. +++ bilateral lower extremity edema. Pulmonary: decreased breath sounds bilaterally, adequate air movement, no wheezing, rhonchi or rales. Gastrointestinal. Abdomen protuberant no organomegaly, non tender, no rebound or guarding Skin. No rashes Musculoskeletal: no joint deformities     Data Reviewed: I have personally reviewed following labs and imaging studies  CBC: Recent Labs  Lab 06/20/18 1148 06/21/18 0627  WBC 8.1 7.4  NEUTROABS  --  4.4  HGB 10.9* 11.8*  HCT 35.7* 37.7  MCV 91.1 89.5  PLT 335 920   Basic Metabolic Panel: Recent Labs  Lab 06/20/18 1148 06/21/18 0627  NA 140 141  K 4.1 3.7  CL 106 101  CO2 24 31  GLUCOSE 184* 141*  BUN 19 18  CREATININE 0.95 0.95  CALCIUM 9.0 9.2  MG 1.6*  --    GFR: Estimated Creatinine Clearance: 81.9 mL/min (by C-G formula based on SCr of 0.95 mg/dL). Liver Function Tests: No results for input(s): AST, ALT, ALKPHOS, BILITOT, PROT, ALBUMIN in the last 168 hours. No results for input(s): LIPASE, AMYLASE in the last 168 hours. No results for input(s): AMMONIA in the last 168 hours. Coagulation Profile: No results for input(s): INR, PROTIME in the last 168 hours. Cardiac Enzymes: No results for input(s): CKTOTAL, CKMB, CKMBINDEX, TROPONINI in the last 168 hours. BNP (last 3 results) No results for input(s): PROBNP in the last 8760 hours. HbA1C: Recent Labs    06/20/18 1647  HGBA1C 8.0*   CBG: Recent Labs  Lab 06/20/18 1650 06/20/18 2125  GLUCAP 113* 119*   Lipid Profile: No results for input(s): CHOL, HDL, LDLCALC, TRIG, CHOLHDL, LDLDIRECT in the last 72 hours. Thyroid Function Tests: Recent Labs    06/20/18 1255  TSH 1.152   Anemia Panel: No results for input(s): VITAMINB12, FOLATE, FERRITIN, TIBC, IRON, RETICCTPCT in the last 72 hours.    Radiology Studies: I have reviewed all of the imaging during this hospital visit personally     Scheduled Meds: . aspirin EC  81 mg Oral Daily  .  atorvastatin  40 mg Oral QHS  . carvedilol  3.125 mg Oral BID WC  . citalopram  20 mg Oral Daily  . enalapril  10 mg Oral QHS  . furosemide  40 mg Intravenous Q12H  . insulin aspart  0-15 Units Subcutaneous TID WC  . insulin aspart  0-5 Units Subcutaneous QHS  . oxybutynin  15 mg Oral Daily  . pantoprazole  40 mg Oral Daily  . polyvinyl alcohol   Both Eyes Daily  . rivaroxaban  20 mg Oral Q supper  . sodium chloride flush  3 mL Intravenous Q12H   Continuous Infusions: . sodium chloride       LOS: 1 day        Avleen Bordwell Gerome Apley, MD [facility] Pager [phone]

## 2018-06-21 NOTE — Care Management Note (Signed)
Case Management Note  Patient Details  Name: NITHYA MERIWEATHER MRN: [id number] Date of Birth: 1950-12-15  Subjective/Objective:    CHF               Action/Plan: Patient lives at home; PCP: Marda Stalker, PA-C; has private insurance with [facility] with prescription drug coverage; CM will continue to follow for progression of care.  Expected Discharge Date:     Possibly 06/23/2018             Expected Discharge Plan:   Home  Status of Service:   In progress  Sherrilyn Rist [phone] 06/21/2018, 12:07 PM

## 2018-06-21 NOTE — Progress Notes (Signed)
    [facility] has been requested to perform a transesophageal echocardiogram on 06/22/18 for atrial fibrillation/pre-DCCV evaluation.  After careful review of history and examination, the risks and benefits of transesophageal echocardiogram have been explained including risks of esophageal damage, perforation (1:10,000 risk), bleeding, pharyngeal hematoma as well as other potential complications associated with conscious sedation including aspiration, arrhythmia, respiratory failure and death. Alternatives to treatment were discussed, questions were answered. Patient is willing to proceed.   TEE scheduled for 06/22/18 at 11am with Dr. Lelan Pons, PA-C 06/21/2018 11:04 AM

## 2018-06-22 ENCOUNTER — Encounter (HOSPITAL_COMMUNITY): Payer: Self-pay | Admitting: *Deleted

## 2018-06-22 ENCOUNTER — Encounter (HOSPITAL_COMMUNITY)
Admission: EM | Disposition: A | Discharge status: Home / Self Care | Payer: Self-pay | Source: Home / Self Care | Attending: Internal Medicine

## 2018-06-22 ENCOUNTER — Inpatient Hospital Stay (HOSPITAL_COMMUNITY): Payer: Medicare HMO | Admitting: Anesthesiology

## 2018-06-22 ENCOUNTER — Inpatient Hospital Stay (HOSPITAL_COMMUNITY): Payer: Medicare HMO

## 2018-06-22 DIAGNOSIS — I340 Nonrheumatic mitral (valve) insufficiency: Secondary | ICD-10-CM

## 2018-06-22 DIAGNOSIS — E785 Hyperlipidemia, unspecified: Secondary | ICD-10-CM

## 2018-06-22 DIAGNOSIS — I10 Essential (primary) hypertension: Secondary | ICD-10-CM

## 2018-06-22 DIAGNOSIS — I351 Nonrheumatic aortic (valve) insufficiency: Secondary | ICD-10-CM

## 2018-06-22 DIAGNOSIS — G4733 Obstructive sleep apnea (adult) (pediatric): Secondary | ICD-10-CM

## 2018-06-22 HISTORY — PX: TEE WITHOUT CARDIOVERSION: SHX5443

## 2018-06-22 HISTORY — PX: CARDIOVERSION: SHX1299

## 2018-06-22 LAB — GLUCOSE, CAPILLARY
GLUCOSE-CAPILLARY: 166 mg/dL — AB (ref 70–99)
GLUCOSE-CAPILLARY: 159 mg/dL — AB (ref 70–99)
Glucose-Capillary: 150 mg/dL — ABNORMAL HIGH (ref 70–99)
Glucose-Capillary: 155 mg/dL — ABNORMAL HIGH (ref 70–99)

## 2018-06-22 LAB — BASIC METABOLIC PANEL
Anion gap: 10 (ref 5–15)
BUN: 20 mg/dL (ref 8–23)
CHLORIDE: 102 mmol/L (ref 98–111)
CO2: 29 mmol/L (ref 22–32)
CREATININE: 1.08 mg/dL — AB (ref 0.44–1.00)
Calcium: 9.2 mg/dL (ref 8.9–10.3)
GFR calc non Af Amer: 51 mL/min — ABNORMAL LOW (ref >60)
GFR, EST AFRICAN AMERICAN: 59 mL/min — AB (ref >60)
Glucose, Bld: 161 mg/dL — ABNORMAL HIGH (ref 70–99)
Potassium: 3.8 mmol/L (ref 3.5–5.1)
Sodium: 141 mmol/L (ref 135–145)

## 2018-06-22 LAB — MAGNESIUM: MAGNESIUM: 1.8 mg/dL (ref 1.7–2.4)

## 2018-06-22 SURGERY — ECHOCARDIOGRAM, TRANSESOPHAGEAL
Anesthesia: General

## 2018-06-22 MED ORDER — FUROSEMIDE 40 MG PO TABS
40.0000 mg | ORAL_TABLET | Freq: Every day | ORAL | Status: DC
  Administered 2018-06-23: 40 mg via ORAL
  Filled 2018-06-22: qty 1

## 2018-06-22 MED ORDER — PROPOFOL 10 MG/ML IV BOLUS
INTRAVENOUS | Status: DC | PRN
  Administered 2018-06-22: 10 mg via INTRAVENOUS
  Administered 2018-06-22 (×2): 30 mg via INTRAVENOUS
  Administered 2018-06-22: 10 mg via INTRAVENOUS

## 2018-06-22 MED ORDER — SODIUM CHLORIDE 0.9 % IV SOLN
INTRAVENOUS | Status: DC
  Administered 2018-06-22: 09:00:00 via INTRAVENOUS

## 2018-06-22 MED ORDER — PROPOFOL 500 MG/50ML IV EMUL
INTRAVENOUS | Status: DC | PRN
  Administered 2018-06-22: 75 ug/kg/min via INTRAVENOUS

## 2018-06-22 MED ORDER — LIDOCAINE HCL (CARDIAC) PF 100 MG/5ML IV SOSY
PREFILLED_SYRINGE | INTRAVENOUS | Status: DC | PRN
  Administered 2018-06-22: 40 mg via INTRAVENOUS

## 2018-06-22 NOTE — Progress Notes (Signed)
Benefit check in progress for Eliquis and Xarelto; Aneta Mins [phone]

## 2018-06-22 NOTE — Plan of Care (Signed)
  Problem: Activity: Goal: Risk for activity intolerance will decrease Outcome: Progressing   Problem: Coping: Goal: Level of anxiety will decrease Outcome: Progressing   Problem: Safety: Goal: Ability to remain free from injury will improve Outcome: Progressing   Problem: Activity: Goal: Capacity to carry out activities will improve Outcome: Progressing   Problem: Activity: Goal: Capacity to carry out activities will improve Outcome: Progressing

## 2018-06-22 NOTE — Anesthesia Postprocedure Evaluation (Signed)
Anesthesia Post Note  Patient: Linda Washington  Procedure(s) Performed: TRANSESOPHAGEAL ECHOCARDIOGRAM (TEE) (N/A ) CARDIOVERSION (N/A )     Patient location during evaluation: Endoscopy Anesthesia Type: General Level of consciousness: awake and alert Pain management: pain level controlled Vital Signs Assessment: post-procedure vital signs reviewed and stable Respiratory status: spontaneous breathing, nonlabored ventilation and respiratory function stable Cardiovascular status: blood pressure returned to baseline and stable Postop Assessment: no apparent nausea or vomiting Anesthetic complications: no    Last Vitals:  Vitals:   06/22/18 1158 06/22/18 1216  BP: 128/65 118/62  Pulse: (!) 58 73  Resp: 12 16  Temp:  36.5 C  SpO2: 92% 91%    Last Pain:  Vitals:   06/22/18 1216  TempSrc: Oral  PainSc: 0-No pain                 Prudy Candy,W. EDMOND

## 2018-06-22 NOTE — Anesthesia Preprocedure Evaluation (Addendum)
Anesthesia Evaluation  Patient identified by MRN, date of birth, ID band Patient awake    Reviewed: Allergy & Precautions, H&P , NPO status , Patient's Chart, lab work & pertinent test results  History of Anesthesia Complications (+) PONV  Airway Mallampati: III  TM Distance: >3 FB Neck ROM: Full    Dental no notable dental hx. (+) Partial Upper, Partial Lower, Dental Advisory Given   Pulmonary sleep apnea ,    Pulmonary exam normal breath sounds clear to auscultation       Cardiovascular hypertension, +CHF  + dysrhythmias Atrial Fibrillation  Rhythm:Irregular Rate:Normal     Neuro/Psych Anxiety negative neurological ROS     GI/Hepatic Neg liver ROS, hiatal hernia, GERD  Medicated,  Endo/Other  diabetes, Type 2, Oral Hypoglycemic AgentsHypothyroidism   Renal/GU negative Renal ROS  negative genitourinary   Musculoskeletal  (+) Arthritis , Osteoarthritis,    Abdominal   Peds  Hematology negative hematology ROS (+)   Anesthesia Other Findings   Reproductive/Obstetrics negative OB ROS                            Anesthesia Physical Anesthesia Plan  ASA: III  Anesthesia Plan: General   Post-op Pain Management:    Induction: Intravenous  PONV Risk Score and Plan: 4 or greater and Propofol infusion and Treatment may vary due to age or medical condition  Airway Management Planned: Nasal Cannula  Additional Equipment:   Intra-op Plan:   Post-operative Plan:   Informed Consent: I have reviewed the patients History and Physical, chart, labs and discussed the procedure including the risks, benefits and alternatives for the proposed anesthesia with the patient or authorized representative who has indicated his/her understanding and acceptance.   Dental advisory given  Plan Discussed with: CRNA  Anesthesia Plan Comments:         Anesthesia Quick Evaluation

## 2018-06-22 NOTE — Progress Notes (Signed)
To the best of my knowledge, documetation by I Bburke,  [employer] nursing student, is correct.

## 2018-06-22 NOTE — Progress Notes (Signed)
PROGRESS NOTE   Linda Washington  YIR:[id number]    DOB: 1950-11-02    DOA: 06/20/2018  PCP: Marda Stalker, PA-C   I have briefly reviewed patients previous medical records in [facility].  Brief Narrative:  67 year old female with PMH of morbid obesity, type II DM, HTN, untreated sleep apnea, PAF, hypothyroid, HLD, presented to ED on 9/2 due to progressive lower extremity edema and dyspnea.  She was admitted for acute diastolic CHF and atrial flutter.  Cardiology was consulted.  She was diuresed appropriately went TEE cardioversion on 9/4.   Assessment & Plan:   Principal Problem:   Acute on chronic diastolic CHF (congestive heart failure) (HCC) Active Problems:   Diabetes mellitus (HCC)   Essential hypertension   Dyslipidemia   OSA (obstructive sleep apnea)   Atrial flutter with controlled response (HCC)   Acute on chronic diastolic CHF: Cardiology was consulted.  She was diuresed with IV Lasix.  -6.5 L since admission.  Also down 10 pounds since admission.  Volume status improved and plan to start Lasix 40 mg daily starting 9/5.  Continue carvedilol and enalapril.  TEE 9/4: LVEF 50-55%.  Atrial flutter/history of PAF: Likely contributing to decompensated CHF.  Cardiology was consulted.  She underwent TEE/DCCV with successful conversion to sinus rhythm on 9/4. CHA2DS2-VASc score of 5 (CHF, HTN, DM Age 36-74, and female).  Currently on Xarelto but patient reports that it is expensive.  Thereby case management consulted to assist with medication needs.  Essential hypertension: Controlled.  Hyperlipidemia: Continue statins.  Untreated OSA: Recommend outpatient follow-up with PCP/pulmonology to reassess and get new CPAP machine.  Previous one apparently not working well.  Uncontrolled type II DM: A1c 8.  Continue SSI for now.  DC home on prior oral hypoglycemics which will need adjustment as outpatient.  Morbid obesity/Body mass index is 45.22 kg/m.   Anemia: Probably chronic  disease.  Stable.  Outpatient evaluation and follow-up.  Hypomagnesemia: Replaced.  DVT prophylaxis: Xarelto Code Status: DNR Family Communication: Discussed in detail with patient's sister at bedside. Disposition: DC home possibly 9/5 after monitoring overnight post DCCV today.   Consultants:  Cardiology  Procedures:  TEE and DCCV 9/4  Antimicrobials:  None   Subjective: Patient seen this morning prior to procedure.  States that she feels much better.  Dyspnea significantly improved and breathing may be close to baseline.  Leg swelling significantly improved but not yet at baseline.  No chest pain or palpitations reported.  ROS: As above, otherwise negative.  Objective:  Vitals:   06/22/18 1145 06/22/18 1158 06/22/18 1216 06/22/18 1621  BP: 124/70 128/65 118/62 (!) 129/51  Pulse: 72 (!) 58 73 66  Resp: 17 12 16 16   Temp:   97.7 F (36.5 C) 98.1 F (36.7 C)  TempSrc:   Oral Oral  SpO2: 93% 92% 91% 98%  Weight:      Height:        Examination:  General exam: Pleasant middle-aged female, moderately built and morbidly obese lying comfortably supine in bed. Respiratory system: Occasional basal crackles but otherwise clear to auscultation. Respiratory effort normal. Cardiovascular system: S1 & S2 heard, RRR. No JVD, murmurs, rubs, gallops or clicks.  Trace bilateral ankle edema.  Telemetry personally reviewed: Atrial flutter with variable but controlled ventricular rate. Gastrointestinal system: Abdomen is nondistended, soft and nontender. No organomegaly or masses felt. Normal bowel sounds heard. Central nervous system: Alert and oriented. No focal neurological deficits. Extremities: Symmetric 5 x 5 power. Skin: No  rashes, lesions or ulcers Psychiatry: Judgement and insight appear normal. Mood & affect appropriate.     Data Reviewed: I have personally reviewed following labs and imaging studies  CBC: Recent Labs  Lab 06/20/18 1148 06/21/18 0627  WBC 8.1 7.4    NEUTROABS  --  4.4  HGB 10.9* 11.8*  HCT 35.7* 37.7  MCV 91.1 89.5  PLT 335 491   Basic Metabolic Panel: Recent Labs  Lab 06/20/18 1148 06/21/18 0627 06/22/18 0429  NA 140 141 141  K 4.1 3.7 3.8  CL 106 101 102  CO2 24 31 29   GLUCOSE 184* 141* 161*  BUN 19 18 20   CREATININE 0.95 0.95 1.08*  CALCIUM 9.0 9.2 9.2  MG 1.6*  --  1.8   HbA1C: Recent Labs    06/20/18 1647  HGBA1C 8.0*   CBG: Recent Labs  Lab 06/21/18 1607 06/21/18 2153 06/22/18 0759 06/22/18 1258 06/22/18 1642  GLUCAP 141* 167* 166* 150* 159*    No results found for this or any previous visit (from the past 240 hour(s)).       Radiology Studies: No results found.      Scheduled Meds: . aspirin EC  81 mg Oral Daily  . atorvastatin  40 mg Oral QHS  . carvedilol  3.125 mg Oral BID WC  . citalopram  20 mg Oral Daily  . enalapril  10 mg Oral QHS  . [START ON 06/23/2018] furosemide  40 mg Oral Daily  . insulin aspart  0-15 Units Subcutaneous TID WC  . insulin aspart  0-5 Units Subcutaneous QHS  . oxybutynin  15 mg Oral Daily  . pantoprazole  40 mg Oral Daily  . polyvinyl alcohol   Both Eyes Daily  . rivaroxaban  20 mg Oral Q supper  . sodium chloride flush  3 mL Intravenous Q12H   Continuous Infusions: . sodium chloride       LOS: 2 days     Vernell Leep, MD, FACP, [facility]. [facility] Pager [phone]  If 7PM-7AM, please contact night-coverage [email or web address] Password [email or web address] 06/22/2018, 5:03 PM

## 2018-06-22 NOTE — Transfer of Care (Signed)
Immediate Anesthesia Transfer of Care Note  Patient: Linda Washington  Procedure(s) Performed: TRANSESOPHAGEAL ECHOCARDIOGRAM (TEE) (N/A ) CARDIOVERSION (N/A )  Patient Location: PACU  Anesthesia Type:MAC  Level of Consciousness: awake, alert , oriented, drowsy and patient cooperative  Airway & Oxygen Therapy: Patient Spontanous Breathing and Patient connected to nasal cannula oxygen  Post-op Assessment: Report given to RN and Post -op Vital signs reviewed and stable  Post vital signs: Reviewed and stable  Last Vitals:  Vitals Value Taken Time  BP 113/68 06/22/2018 11:42 AM  Temp    Pulse 74 06/22/2018 11:43 AM  Resp 17 06/22/2018 11:43 AM  SpO2 93 % 06/22/2018 11:43 AM  Vitals shown include unvalidated device data.  Last Pain:  Vitals:   06/22/18 1138  TempSrc:   PainSc: 0-No pain         Complications: No apparent anesthesia complications

## 2018-06-22 NOTE — Progress Notes (Addendum)
Progress Note  Patient Name: Linda Washington Date of Encounter: 06/22/2018  Primary Cardiologist: Candee Furbish, MD   Subjective   She reports feeling improved. Happy to be back in NSR. States her breathing is almost back to baseline and her LE swelling has improved. Denies chest pain.   Patient reports difficulty with cost of xarelto (~$400 per month) and given need for long term use asked about alternatives.   Inpatient Medications    Scheduled Meds: . aspirin EC  81 mg Oral Daily  . atorvastatin  40 mg Oral QHS  . carvedilol  3.125 mg Oral BID WC  . citalopram  20 mg Oral Daily  . enalapril  10 mg Oral QHS  . furosemide  40 mg Intravenous Q12H  . insulin aspart  0-15 Units Subcutaneous TID WC  . insulin aspart  0-5 Units Subcutaneous QHS  . oxybutynin  15 mg Oral Daily  . pantoprazole  40 mg Oral Daily  . polyvinyl alcohol   Both Eyes Daily  . rivaroxaban  20 mg Oral Q supper  . sodium chloride flush  3 mL Intravenous Q12H   Continuous Infusions: . sodium chloride     PRN Meds: sodium chloride, acetaminophen, ondansetron (ZOFRAN) IV, sodium chloride flush   Vital Signs    Vitals:   06/22/18 1143 06/22/18 1145 06/22/18 1158 06/22/18 1216  BP: 113/68 124/70 128/65 118/62  Pulse: 74 72 (!) 58 73  Resp: 17 17 12 16   Temp: 98.5 F (36.9 C)   97.7 F (36.5 C)  TempSrc: Oral   Oral  SpO2: 93% 93% 92% 91%  Weight:      Height:        Intake/Output Summary (Last 24 hours) at 06/22/2018 1243 Last data filed at 06/22/2018 1127 Gross per 24 hour  Intake 880 ml  Output 1250 ml  Net -370 ml   Filed Weights   06/20/18 1643 06/21/18 0357 06/22/18 0349  Weight: (!) 139.5 kg (!) 136.1 kg 134.9 kg    Telemetry    Atrial flutter pre-DCCV; currently in NSR with intermittent sinus bradycardia - Personally Reviewed  ECG    Post-DCCV EKG: NSR with 1st degree AV block with TWI in lateral leads - Personally Reviewed  Physical Exam   GEN: Pleasant obese female sitting  upright in bed in no acute distress.   Neck: No JVD, no carotid bruits Cardiac: RRR, no murmurs, rubs, or gallops.  Respiratory: Clear to auscultation bilaterally, no wheezes/ rales/ rhonchi GI: NABS, Soft, obese, nontender, non-distended  MS: No edema; No deformity. Neuro:  Nonfocal, moving all extremities spontaneously Psych: Normal affect   Labs    Chemistry Recent Labs  Lab 06/20/18 1148 06/21/18 0627 06/22/18 0429  NA 140 141 141  K 4.1 3.7 3.8  CL 106 101 102  CO2 24 31 29   GLUCOSE 184* 141* 161*  BUN 19 18 20   CREATININE 0.95 0.95 1.08*  CALCIUM 9.0 9.2 9.2  GFRNONAA 60* 60* 51*  GFRAA >60 >60 59*  ANIONGAP 10 9 10      Hematology Recent Labs  Lab 06/20/18 1148 06/21/18 0627  WBC 8.1 7.4  RBC 3.92 4.21  HGB 10.9* 11.8*  HCT 35.7* 37.7  MCV 91.1 89.5  MCH 27.8 28.0  MCHC 30.5 31.3  RDW 14.4 14.3  PLT 335 343    Cardiac EnzymesNo results for input(s): TROPONINI in the last 168 hours.  Recent Labs  Lab 06/20/18 1152  TROPIPOC 0.00     BNP  Recent Labs  Lab 06/20/18 1255  BNP 111.9*     DDimer No results for input(s): DDIMER in the last 168 hours.   Radiology    No results found.  Cardiac Studies   TEE/DCCV 06/22/18: Procedure Note:  The patient signed informed consent.  She has had had therapeutic anticoagulation with rivaroxaban and had no visualized clot on TEE.  Anesthesia was administered by Dr. Ola Spurr.  Adequate airway was maintained throughout and vital followed per protocol.  She was cardioverted x 1 with 120 J of biphasic synchronized energy.  She converted to NSR.  There were no apparent complications.  The patient had normal neuro status and respiratory status post procedure with vitals stable as recorded elsewhere.    Follow up:  She will continue on current medical therapy.  She is currently hospitalized and will follow with Dr. Marlou Porch post discharge. Of note, rivaroxaban is prohibitively expensive for her. She understands  importance of maintaining anticoagulation uninterrupted for 4 weeks post cardioversion, but she will look into other options for anticoagulation after that period.  Patient Profile     67 y.o. female with PMH of morbid obesity, type II diabetes mellitus, hypertension, HLD, untreated sleep apnea, paroxysmal atrial fibrillationwho is being followed by cardiology for the evaluation of atrial fibrillation and congestive heart failure  Assessment & Plan    1. Acute on chronic diastolic CHF: patient presented with SOB/ LE edema and was found to be volume overload. She was started on IV lasix and has diuresed well with UOP net negative 1.0L in the past 24 hours and -7.1L this admission. Her weight is down from 307.6lbs on admission to 297.4lbs today. She was started on coreg this admission, in addition to her home enalapril.  - Plan to transition to po lasix 40mg  daily starting tomorrow - Continue coreg and enalapril  2. Atrial flutter in patient with a history of paroxysmal atrial fibrillation: likely contributing to #1. She underwent TEE/DCCV this admission with successful conversion to NSR. She was recommended to continue uninterrupted anticoagulation for 4 weeks post procedure. Patient reports xarelto is expensive (~$400 per month). Will need long term anticoagulation going forward given recurrence and CHA2DS2-VASc score of 5 (CHF, HTN, DM Age 72-74, and female)  - Continue xarelto for now for stroke ppx/post procedure - Discussed with case management who will assist with medication needs to determine if there is an assistance program or if alternative medications are more affordable.   3. HTN: BP stable - Continue current regimen  4. HLD: - Continue statin  5. Untreated OSA: had a CPAP machine many years ago but reported that the air was too forceful and kept her awake.  - Would benefit from obtaining a new CPAP machine.   For questions or updates, please contact [place] Please  consult [email or web address] for contact info under Cardiology/STEMI.      Signed, Abigail Butts, PA-C  06/22/2018, 12:43 PM   [phone]  History and all data above reviewed.  Patient examined.  I agree with the findings as above. Breathing much better.  No pain.   The patient exam reveals COR:RRR  ,  Lungs: Clear  ,  Abd: Positive bowel sounds, no rebound no guarding, Ext No edema  .  All available labs, radiology testing, previous records reviewed. Agree with documented assessment and plan. Atrial flutter:   DCCV completed.  Now NSR.  Continue current therapy.  We are going to need to work on making sure that she has Xarelto  for at least one month post procedure and then could transition to warfarin if necessary.  Acute diastolic HF:  Continue IV diuretic tonight.  Probably home in the AM.    Jeneen Rinks Nicolemarie Wooley  2:01 PM  06/22/2018

## 2018-06-22 NOTE — Care Management (Signed)
06-22-18 BENEFITS CHECK:  # 7   S/W SHAYNA  @ [employer] # [phone] OPT- 2   ELIQUIS  10 MG : NONE FORMULARY  1. ELIQUIS  2.5 MG BID COVER- YES CO-PAY- $ 47.00 TIER- 3 DRUG PRIOR APPROVAL- NO  2. ELIQUIS  5 MG BID COVER- YES CO-PAY-  $ 47.00 TIER- 3 DRUG PRIOR APPROVAL- NO  3. XARELTO 15 MG BID COVER- YES CO-PAY- $ 47.00 TIER- 3 DRUG PRIOR APPROVAL- NO  4.  XARELTO  20 MG DAILY COVER- YES CO-PAY- $ 47.00 TIER- 3 DRUG PRIOR APPROVAL- NO  PREFERRED PHARMACY : YES [facility] , [facility] AND [facility]

## 2018-06-22 NOTE — Interval H&P Note (Signed)
History and Physical Interval Note:  06/22/2018 10:33 AM  Linda Washington  has presented today for surgery, with the diagnosis of AFIB  The various methods of treatment have been discussed with the patient and family. After consideration of risks, benefits and other options for treatment, the patient has consented to  Procedure(s): TRANSESOPHAGEAL ECHOCARDIOGRAM (TEE) (N/A) CARDIOVERSION (N/A) as a surgical intervention .  The patient's history has been reviewed, patient examined, no change in status, stable for surgery.  I have reviewed the patient's chart and labs.  Questions were answered to the patient's satisfaction.     Niamya Vittitow Harrell Gave

## 2018-06-22 NOTE — CV Procedure (Signed)
Brief TEE/CV note  Patient underwent TEE for pre-cardioversion evaluation. Mild valvular regurgitation at aortic, mitral, and tricuspid position. Normal LV EF. Likely trivial PFO flow.   No thrombus in LA/LAA. Initial concern was for thrombus of RAA, but on multiple views and x-plane, this was actually artifact from the intra-atrial septum. No thrombus in RA/RAA.  Procedure:   DCCV  Indication:  Symptomatic atrial flutter  Procedure Note:  The patient signed informed consent.  She has had had therapeutic anticoagulation with rivaroxaban and had no visualized clot on TEE.  Anesthesia was administered by Dr. Ola Spurr.  Adequate airway was maintained throughout and vital followed per protocol.  She was cardioverted x 1 with 120 J of biphasic synchronized energy.  She converted to NSR.  There were no apparent complications.  The patient had normal neuro status and respiratory status post procedure with vitals stable as recorded elsewhere.    Follow up:  She will continue on current medical therapy.  She is currently hospitalized and will follow with Dr. Marlou Porch post discharge. Of note, rivaroxaban is prohibitively expensive for her. She understands importance of maintaining anticoagulation uninterrupted for 4 weeks post cardioversion, but she will look into other options for anticoagulation after that period.  Buford Dresser, MD PhD 06/22/2018 11:33 AM

## 2018-06-23 ENCOUNTER — Telehealth: Payer: Self-pay | Admitting: Cardiology

## 2018-06-23 DIAGNOSIS — E1165 Type 2 diabetes mellitus with hyperglycemia: Secondary | ICD-10-CM

## 2018-06-23 LAB — BASIC METABOLIC PANEL
Anion gap: 10 (ref 5–15)
BUN: 24 mg/dL — ABNORMAL HIGH (ref 8–23)
CHLORIDE: 102 mmol/L (ref 98–111)
CO2: 28 mmol/L (ref 22–32)
Calcium: 8.9 mg/dL (ref 8.9–10.3)
Creatinine, Ser: 1.06 mg/dL — ABNORMAL HIGH (ref 0.44–1.00)
GFR calc non Af Amer: 52 mL/min — ABNORMAL LOW (ref >60)
GLUCOSE: 163 mg/dL — AB (ref 70–99)
POTASSIUM: 4.2 mmol/L (ref 3.5–5.1)
SODIUM: 140 mmol/L (ref 135–145)

## 2018-06-23 LAB — GLUCOSE, CAPILLARY
GLUCOSE-CAPILLARY: 186 mg/dL — AB (ref 70–99)
Glucose-Capillary: 159 mg/dL — ABNORMAL HIGH (ref 70–99)

## 2018-06-23 MED ORDER — RIVAROXABAN 20 MG PO TABS: 20.0000 mg | ORAL_TABLET | Freq: Every day | ORAL | 3 refills | Status: DC

## 2018-06-23 MED ORDER — CARVEDILOL 3.125 MG PO TABS: 3.1250 mg | ORAL_TABLET | Freq: Two times a day (BID) | ORAL | 3 refills | Status: DC

## 2018-06-23 MED ORDER — FUROSEMIDE 40 MG PO TABS: 40.0000 mg | ORAL_TABLET | Freq: Every day | ORAL | 3 refills | Status: DC

## 2018-06-23 NOTE — Progress Notes (Signed)
Progress Note  Patient Name: Linda Washington Date of Encounter: 06/23/2018  Primary Cardiologist:   Candee Furbish, MD   Subjective   She is breathing back to baseline.  Inpatient Medications    Scheduled Meds: . aspirin EC  81 mg Oral Daily  . atorvastatin  40 mg Oral QHS  . carvedilol  3.125 mg Oral BID WC  . citalopram  20 mg Oral Daily  . enalapril  10 mg Oral QHS  . furosemide  40 mg Oral Daily  . insulin aspart  0-15 Units Subcutaneous TID WC  . insulin aspart  0-5 Units Subcutaneous QHS  . oxybutynin  15 mg Oral Daily  . pantoprazole  40 mg Oral Daily  . polyvinyl alcohol   Both Eyes Daily  . rivaroxaban  20 mg Oral Q supper  . sodium chloride flush  3 mL Intravenous Q12H   Continuous Infusions: . sodium chloride     PRN Meds: sodium chloride, acetaminophen, ondansetron (ZOFRAN) IV, sodium chloride flush   Vital Signs    Vitals:   06/22/18 2057 06/23/18 0046 06/23/18 0455 06/23/18 0904  BP: (!) 138/52 (!) 122/50 (!) 122/54 122/72  Pulse: 69 73 (!) 54 (!) 57  Resp: 19  19   Temp: 98.6 F (37 C) 98 F (36.7 C) 98 F (36.7 C)   TempSrc: Oral Oral Oral   SpO2: 94% 94% 93%   Weight:   134.5 kg   Height:        Intake/Output Summary (Last 24 hours) at 06/23/2018 1119 Last data filed at 06/23/2018 0945 Gross per 24 hour  Intake 1812.33 ml  Output 2400 ml  Net -587.67 ml   Filed Weights   06/21/18 0357 06/22/18 0349 06/23/18 0455  Weight: (!) 136.1 kg 134.9 kg 134.5 kg    Telemetry    Atrial flutter with controlled rate.   - Personally Reviewed  ECG    NA - Personally Reviewed  Physical Exam   GEN: No acute distress.   Neck: No  JVD Cardiac:  Irregular RR, no murmurs, rubs, or gallops.  Respiratory:    Bilateral diffuse fine crackles GI: Soft, nontender, non-distended  MS: No  edema; No deformity. Neuro:  Nonfocal  Psych: Normal affect   Labs    Chemistry Recent Labs  Lab 06/21/18 0627 06/22/18 0429 06/23/18 0404  NA 141 141 140  K  3.7 3.8 4.2  CL 101 102 102  CO2 31 29 28   GLUCOSE 141* 161* 163*  BUN 18 20 24*  CREATININE 0.95 1.08* 1.06*  CALCIUM 9.2 9.2 8.9  GFRNONAA 60* 51* 52*  GFRAA >60 59* >60  ANIONGAP 9 10 10      Hematology Recent Labs  Lab 06/20/18 1148 06/21/18 0627  WBC 8.1 7.4  RBC 3.92 4.21  HGB 10.9* 11.8*  HCT 35.7* 37.7  MCV 91.1 89.5  MCH 27.8 28.0  MCHC 30.5 31.3  RDW 14.4 14.3  PLT 335 343    Cardiac EnzymesNo results for input(s): TROPONINI in the last 168 hours.  Recent Labs  Lab 06/20/18 1152  TROPIPOC 0.00     BNP Recent Labs  Lab 06/20/18 1255  BNP 111.9*     DDimer No results for input(s): DDIMER in the last 168 hours.   Radiology    No results found.  Cardiac Studies   TEE  Study Conclusions  - Left ventricle: Systolic function was normal. The estimated   ejection fraction was in the range of 50% to  55%. Wall motion was   normal; there were no regional wall motion abnormalities. No   evidence of thrombus. 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 - Mitral valve: There was mild regurgitation directed centrally. - Left atrium: No evidence of thrombus in the atrial cavity or   appendage. - Right atrium: No evidence of thrombus in the atrial cavity or   appendage. - Atrial septum: A patent foramen ovale cannot be excluded. Trivial   color flow noted at FO/intra-atrial septum. - Tricuspid valve: There was mild-moderate regurgitation. - Pulmonic valve: There was trivial regurgitation.   Patient Profile     67 y.o. female with PMH of morbid obesity, type II diabetes mellitus, hypertension, HLD, untreated sleep apnea, paroxysmal atrial fibrillationwho is being followed by cardiology for the evaluation of atrial fibrillation and congestive heart failure  Assessment & Plan    ACUTE ON CHRONIC DIASTOLIC HF:  She feels like her breathing is baseline.   Agree with  current MAR meds  ATRIAL FLUTTER:  Status post DCCV.   Back in flutter.  I will send a note to Dr. Marlou Porch.  I don't see an advantage to ablation at this point.    HTN:   The blood pressure is at target. No change in medications is indicated. We will continue with therapeutic lifestyle changes (TLC).  SLEEP APNEA:  Needs referral to sleep clinic at discharge to follow up on this.  I will discuss with Dr. Marlou Porch.  He will likely refer her to Dr. Radford Pax.  I talked to the patient about this.       For questions or updates, please contact [place] Please consult [email or web address] for contact info under Cardiology/STEMI.   Signed, Minus Breeding, MD  06/23/2018, 11:19 AM

## 2018-06-23 NOTE — Telephone Encounter (Signed)
New Message    Patient has a TOC appt with [name] on 09/19.

## 2018-06-23 NOTE — Discharge Summary (Signed)
Physician Discharge Summary  Linda Washington ELF:[id number] DOB: May 07, 1951  PCP: Marda Stalker, PA-C  Admit date: 06/20/2018 Discharge date: 06/23/2018  Recommendations for Outpatient Follow-up:  1. [name], NP/Cardiology on 07/07/2018 at 2:30 PM 2. Marda Stalker, PA-C/PCP in 1 week with repeat labs (CBC & BMP). 3. Dr. Candee Furbish, Cardiology 4. Recommend outpatient reevaluation of OSA and need for new CPAP machine.  Home Health: None Equipment/Devices: None  Discharge Condition: Improved and stable CODE STATUS: DNR Diet recommendation: Heart healthy and diabetic diet.  Discharge Diagnoses:  Principal Problem:   Acute on chronic diastolic CHF (congestive heart failure) (HCC) Active Problems:   Diabetes mellitus (HCC)   Essential hypertension   Dyslipidemia   OSA (obstructive sleep apnea)   Atrial flutter with controlled response [facility])   Brief Summary: 67 year old female with PMH of morbid obesity, type II DM, HTN, untreated sleep apnea, PAF, hypothyroid, HLD, presented to ED on 9/2 due to progressive lower extremity edema and dyspnea.  She was admitted for acute diastolic CHF and atrial flutter.  Cardiology was consulted.  She was diuresed appropriately and underwent TEE cardioversion on 9/4.  She however reverted back to atrial flutter on 9/5.   Assessment & Plan:   Acute on chronic diastolic CHF: Cardiology was consulted.  She was diuresed with IV Lasix.    -7.7 L since admission.  Also down approximately 12 pounds since admission.  Volume status improved and plan transitioned to oral Lasix 40 mg daily on day of discharge.  Continue carvedilol and enalapril.  TEE 9/4: LVEF 50-55%.  Atrial flutter/history of PAF: Likely contributing to decompensated CHF.  Cardiology was consulted.  She underwent TEE/DCCV with successful conversion to sinus rhythm on 9/4. CHA2DS2-VAScscore of 5 (CHF, HTN, DM Age 42-74, and female).  Patient reverted back to atrial flutter on day of  discharge.  Cardiology has seen and cleared for discharge home.  They do not think that there is an advantage to ablation at this point.  Continue carvedilol for rate control.  Continue Xarelto for anticoagulation.  Case management has assisted patient with 1 month of discount card and it will apparently cost patient approximately $47 per month thereafter through her insurance which she cannot afford.  As discussed with cardiology, discontinued aspirin 81 mg daily.  Essential hypertension: Controlled.  Continue prior home dose of lisinopril and newly started carvedilol.  Hyperlipidemia: Continue statins.  Untreated OSA: Recommend outpatient follow-up with PCP/pulmonology to reassess and get new CPAP machine.  Previous one apparently not working well.  As discussed with Cardiology, they will likely refer her to Dr. Fransico Him for further evaluation of her sleep apnea.  Uncontrolled type II DM: A1c 8.    Treated in the hospital with SSI and resume prior home oral hypoglycemics at discharge which will need further titration during outpatient follow-up with PCP for better control.  Morbid obesity/Body mass index is 45.22 kg/m.   Patient counseled that she needs to diet, exercise and lose weight.  She verbalized understanding.  Anemia: Probably chronic disease.  Stable.  Outpatient evaluation and follow-up.  Hypomagnesemia: Replaced.    Consultants:  Cardiology  Procedures:  TEE and DCCV 9/4  Discharge Instructions  Discharge Instructions    (HEART FAILURE PATIENTS) Call MD:  Anytime you have any of the following symptoms: 1) 3 pound weight gain in 24 hours or 5 pounds in 1 week 2) shortness of breath, with or without a dry hacking cough 3) swelling in the hands, feet or stomach 4)  if you have to sleep on extra pillows at night in order to breathe.   Complete by:  As directed    Call MD for:  difficulty breathing, headache or visual disturbances   Complete by:  As directed     Call MD for:  extreme fatigue   Complete by:  As directed    Call MD for:  persistant dizziness or light-headedness   Complete by:  As directed    Diet - low sodium heart healthy   Complete by:  As directed    Diet Carb Modified   Complete by:  As directed    Increase activity slowly   Complete by:  As directed        Medication List    STOP taking these medications   aspirin EC 81 MG tablet   naproxen 500 MG tablet Commonly known as:  NAPROSYN     TAKE these medications   acetaminophen 650 MG CR tablet Commonly known as:  TYLENOL Take 1,300 mg by mouth daily as needed (for pain).   atorvastatin 40 MG tablet Commonly known as:  LIPITOR Take 40 mg by mouth at bedtime.   carvedilol 3.125 MG tablet Commonly known as:  COREG Take 1 tablet (3.125 mg total) by mouth 2 (two) times daily with a meal.   cetirizine 10 MG tablet Commonly known as:  ZYRTEC Take 10 mg by mouth daily as needed (for allergies or allergic reactions).   citalopram 20 MG tablet Commonly known as:  CELEXA Take 20 mg by mouth daily.   enalapril 10 MG tablet Commonly known as:  VASOTEC Take 10 mg by mouth at bedtime.   fluticasone 50 MCG/ACT nasal spray Commonly known as:  FLONASE Place 1-2 sprays into both nostrils daily as needed for allergies or rhinitis.   furosemide 40 MG tablet Commonly known as:  LASIX Take 1 tablet (40 mg total) by mouth daily. Start taking on:  06/24/2018   glimepiride 2 MG tablet Commonly known as:  AMARYL Take 2 mg by mouth daily before breakfast.   metFORMIN 1000 MG tablet Commonly known as:  GLUCOPHAGE Take 1 tablet (1,000 mg total) by mouth 2 (two) times daily with a meal. Restart medication 48 hrs after discharge. What changed:  additional instructions   omeprazole 40 MG capsule Commonly known as:  PRILOSEC Take 40 mg by mouth daily.   oxybutynin 15 MG 24 hr tablet Commonly known as:  DITROPAN XL Take 15 mg by mouth daily.   rivaroxaban 20 MG Tabs  tablet Commonly known as:  XARELTO Take 1 tablet (20 mg total) by mouth daily with supper.   SYSTANE OP Place 1-2 drops into both eyes daily.      Follow-up Information    Isaiah Serge, NP Follow up on 07/07/2018.   Specialties:  Cardiology, Radiology Why:  Please arrive 15 minutes early for your 2:30pm appointment for post-hospital follow-up Contact information: [address] [place] [address] [phone]        Marda Stalker, PA-C. Schedule an appointment as soon as possible for a visit on 06/30/2018.   Specialty:  Family Medicine Why:  To be seen with repeat labs (CBC & BMP).  Recommend outpatient evaluation for sleep apnea. Contact information: [place] [address] [phone]        Jerline Pain, MD.   Specialty:  Cardiology Contact information: [phone] [address] [place] [address] [phone]  Marda Stalker, PA-C. Go on 06/30/2018.   Specialty:  Family Medicine Why:  @2pm  Contact information: [place] [address] [phone]          Allergies  Allergen Reactions  . Hydrocodone Other (See Comments)    Weird sensations mentally; "makes me fuzzy"  . Methocarbamol Hives  . Oxycodone Other (See Comments)    Weird sensations mentally; "makes me fuzzy"  . Latex Hives and Rash      Procedures/Studies: Dg Chest 2 View  Result Date: 06/20/2018 CLINICAL DATA:  Patient complains of increasing lower extremity edema and SOB x 4 days. Denies CP. Has noticed a 3lb weight gain over the past 2 days. Was sent by [facility] for further evaluation of new onset CHF, hx: DM, HTN, BREAST LUMPECTOMY-BENIGN EXAM: CHEST - 2 VIEW COMPARISON:  02/01/2017 FINDINGS: The heart is enlarged. No focal consolidations or pleural effusions. Mildly prominent interstitial markings are suspicious for interstitial edema given the new cardiomegaly. IMPRESSION: Cardiomegaly and suspected interstitial  pulmonary edema. Electronically Signed   By: Nolon Nations M.D.   On: 06/20/2018 12:20      Subjective: Denies complaints.  Anxious to go home.  No dyspnea and reports breathing back to baseline.  Leg swelling is much improved.  No leg pain.  Reports ambulating comfortably in the room without distress.  As per RN, no acute issues noted.  Discharge Exam:  Vitals:   06/22/18 2057 06/23/18 0046 06/23/18 0455 06/23/18 0904  BP: (!) 138/52 (!) 122/50 (!) 122/54 122/72  Pulse: 69 73 (!) 54 (!) 57  Resp: 19  19   Temp: 98.6 F (37 C) 98 F (36.7 C) 98 F (36.7 C)   TempSrc: Oral Oral Oral   SpO2: 94% 94% 93%   Weight:   134.5 kg   Height:        General exam: Pleasant middle-aged female, moderately built and morbidly obese lying comfortably supine in bed. Respiratory system:  Very occasional basal crackles but otherwise clear to auscultation. Respiratory effort normal. Cardiovascular system: S1 & S2 heard, irregularly irregular. No JVD, murmurs, rubs, gallops or clicks.  Trace bilateral ankle edema.  Telemetry personally reviewed: Patient has reverted from sinus rhythm to atrial flutter with variable but controlled ventricular rate sometime today.  Ventricular rate in the 50s-60s. Gastrointestinal system: Abdomen is nondistended, soft and nontender. No organomegaly or masses felt. Normal bowel sounds heard. Central nervous system: Alert and oriented. No focal neurological deficits. Extremities: Symmetric 5 x 5 power. Skin: No rashes, lesions or ulcers Psychiatry: Judgement and insight appear normal. Mood & affect appropriate.     The results of significant diagnostics from this hospitalization (including imaging, microbiology, ancillary and laboratory) are listed below for reference.      Labs: CBC: Recent Labs  Lab 06/20/18 1148 06/21/18 0627  WBC 8.1 7.4  NEUTROABS  --  4.4  HGB 10.9* 11.8*  HCT 35.7* 37.7  MCV 91.1 89.5  PLT 335 638   Basic Metabolic Panel: Recent  Labs  Lab 06/20/18 1148 06/21/18 0627 06/22/18 0429 06/23/18 0404  NA 140 141 141 140  K 4.1 3.7 3.8 4.2  CL 106 101 102 102  CO2 24 31 29 28   GLUCOSE 184* 141* 161* 163*  BUN 19 18 20  24*  CREATININE 0.95 0.95 1.08* 1.06*  CALCIUM 9.0 9.2 9.2 8.9  MG 1.6*  --  1.8  --    BNP (last 3 results) Recent Labs    06/20/18 1255  BNP 111.9*    CBG: Recent Labs  Lab 06/22/18 1258 06/22/18 1642 06/22/18 2154 06/23/18 0803 06/23/18 1137  GLUCAP 150* 159* 155* 159* 186*   Hgb A1c Recent Labs    06/20/18 1647  HGBA1C 8.0*    Thyroid function studies Recent Labs    06/20/18 1255  TSH 1.152     Time coordinating discharge:35 minutes  SIGNED:  Vernell Leep, MD, FACP, [facility]. [facility] Pager [phone]  If 7PM-7AM, please contact night-coverage [email or web address] Password [email or web address] 06/23/2018, 12:50 PM

## 2018-06-28 NOTE — Telephone Encounter (Signed)
Placed TOC call to pt.  Left a message for pt to call back.

## 2018-06-28 NOTE — Telephone Encounter (Signed)
Patient contacted regarding discharge from [facility] on 06/23/18.  Patient understands to follow up with provider [name], NP on 07/07/18 at 2:30 at [facility]. Patient understands discharge instructions? yes Patient understands medications and regiment? yes Patient understands to bring all medications to this visit? yes

## 2018-06-30 DIAGNOSIS — I5030 Unspecified diastolic (congestive) heart failure: Secondary | ICD-10-CM | POA: Diagnosis not present

## 2018-06-30 DIAGNOSIS — E1165 Type 2 diabetes mellitus with hyperglycemia: Secondary | ICD-10-CM | POA: Diagnosis not present

## 2018-06-30 DIAGNOSIS — I4892 Unspecified atrial flutter: Secondary | ICD-10-CM | POA: Diagnosis not present

## 2018-06-30 DIAGNOSIS — Z6841 Body Mass Index (BMI) 40.0 and over, adult: Secondary | ICD-10-CM | POA: Diagnosis not present

## 2018-06-30 DIAGNOSIS — E1142 Type 2 diabetes mellitus with diabetic polyneuropathy: Secondary | ICD-10-CM | POA: Diagnosis not present

## 2018-06-30 DIAGNOSIS — Z79899 Other long term (current) drug therapy: Secondary | ICD-10-CM | POA: Diagnosis not present

## 2018-07-04 ENCOUNTER — Telehealth: Payer: Self-pay | Admitting: Cardiology

## 2018-07-04 NOTE — Telephone Encounter (Signed)
Spoke with Linda Washington and has noted 1 lb weight gain since yesterday .Has taken am dose of Furosemide and is urinating complaining of a little SOB Will continue to monitor.Linda Washington has appt this week with extender.Patient instructed to call if has any further weight gain and to keep upcoming appt.Will forward to Dr Marlou Porch for review /cy

## 2018-07-04 NOTE — Telephone Encounter (Signed)
.  Pt c/o swelling: STAT is pt has developed SOB within 24 hours  1) How much weight have you gained and in what time span? 1 pound in a day  2) If swelling, where is the swelling located? Stomach   3) Are you currently taking a fluid pill? yes  4) Are you currently SOB? alittle  5) Do you have a log of your daily weights (if so, list)? Yes do not have it with her  6) Have you gained 3 pounds in a day or 5 pounds in a week? no  7) Have you traveled recently? no

## 2018-07-05 DIAGNOSIS — I509 Heart failure, unspecified: Secondary | ICD-10-CM | POA: Diagnosis not present

## 2018-07-05 DIAGNOSIS — Z5181 Encounter for therapeutic drug level monitoring: Secondary | ICD-10-CM | POA: Diagnosis not present

## 2018-07-05 DIAGNOSIS — Z6841 Body Mass Index (BMI) 40.0 and over, adult: Secondary | ICD-10-CM | POA: Diagnosis not present

## 2018-07-05 DIAGNOSIS — E1142 Type 2 diabetes mellitus with diabetic polyneuropathy: Secondary | ICD-10-CM | POA: Diagnosis not present

## 2018-07-05 DIAGNOSIS — M2042 Other hammer toe(s) (acquired), left foot: Secondary | ICD-10-CM | POA: Diagnosis not present

## 2018-07-07 ENCOUNTER — Telehealth: Payer: Self-pay | Admitting: *Deleted

## 2018-07-07 ENCOUNTER — Ambulatory Visit: Payer: Medicare HMO | Admitting: Cardiology

## 2018-07-07 NOTE — Telephone Encounter (Signed)
Pt called back and has been scheduled to see Matthew Saras, NP, by scheduling, 07/12/18.

## 2018-07-07 NOTE — Telephone Encounter (Signed)
Called left detailed message with pt that her appt today, 07/07/18 with [name], NP has been cacncelled, due to provider getting sick and that her appt has been r/s for 07/08/18 with [name], PA-C arriving at 8:00. I asked to call back and confirm that she received this message.

## 2018-07-08 ENCOUNTER — Ambulatory Visit: Payer: Medicare HMO | Admitting: Physician Assistant

## 2018-07-08 ENCOUNTER — Telehealth: Payer: Self-pay | Admitting: Cardiology

## 2018-07-08 NOTE — Telephone Encounter (Signed)
New Message:     Patient states she  has gained 3 lbs in 1 day

## 2018-07-08 NOTE — Telephone Encounter (Signed)
Phone number listed to contact patient has been disconnected.   Reviewed with Dr Marlou Porch who gives verbal orders to double Furosemide 40 mg to twice daily for 3 days.  Called and left message for pt on vm for home # [phone] requesting she c/b to discuss.

## 2018-07-08 NOTE — Telephone Encounter (Signed)
Reviewed orders with patient who will increase Furosemide as instructed for 3 days.  She will continue to monitor weights and limit NA+ intake.   Of note - phone # [phone] is incorrect and should be [phone].  This will be corrected.  She will keep appt as scheduled on Tuesday.

## 2018-07-12 ENCOUNTER — Other Ambulatory Visit: Payer: Self-pay | Admitting: *Deleted

## 2018-07-12 ENCOUNTER — Ambulatory Visit: Payer: Medicare HMO | Admitting: Nurse Practitioner

## 2018-07-12 ENCOUNTER — Ambulatory Visit: Payer: Medicare HMO

## 2018-07-12 ENCOUNTER — Encounter: Payer: Self-pay | Admitting: Nurse Practitioner

## 2018-07-12 VITALS — BP 112/70 | HR 68 | Ht 68.0 in | Wt 290.1 lb

## 2018-07-12 DIAGNOSIS — I4892 Unspecified atrial flutter: Secondary | ICD-10-CM | POA: Diagnosis not present

## 2018-07-12 DIAGNOSIS — G4733 Obstructive sleep apnea (adult) (pediatric): Secondary | ICD-10-CM | POA: Diagnosis not present

## 2018-07-12 DIAGNOSIS — G4730 Sleep apnea, unspecified: Secondary | ICD-10-CM

## 2018-07-12 DIAGNOSIS — Z79899 Other long term (current) drug therapy: Secondary | ICD-10-CM | POA: Diagnosis not present

## 2018-07-12 DIAGNOSIS — I10 Essential (primary) hypertension: Secondary | ICD-10-CM | POA: Diagnosis not present

## 2018-07-12 NOTE — Progress Notes (Signed)
CARDIOLOGY OFFICE NOTE  Date:  07/12/2018    [name] Date of Birth: 11/29/1950 Medical Record #[id number]  PCP:  Marda Stalker, PA-C  Cardiologist:  [facility]  Chief Complaint  Patient presents with  . Atrial Flutter    Post hospital visit - seen for Dr. Marlou Porch    History of Present Illness: Linda Washington is a 67 y.o. female who presents today for a follow up visit. Seen for Dr. Marlou Porch.   She has a history of morbid obesity,  DMT2, HTN, untreated OS (intolerant to CPAP in the past, post op atrial flutter (following knee surgery). She has been on Xarelto in the past.   Admitted in April of 2018 with chest pain, hypertensive emergency and elevated troponin.  CTA with no evidence of dissection or aneurysm. EKG with sinus rhythm - she  underwent left heart catheterization via right radial artery. She was found to have no non-obstructive disease; no intervention needed. She was dischrged that same day.   Last seen by Bonney Leitz, PA back in April of 2018 - was doing well - had been to see her PCP and was placed on PPI and getting her gallbladder worked up.   She was back in the hospital earlier this month with lower extremity edema and shortness of breath. She was admitted for acute diastolic CHF and found to be in atrial flutter. She was diuresed and underwent a TEE cardioversion on 9/4 - but reverted back to atrial flutter the following day. She is now managed with rate control and anticoagulation.   Comes in today. Here with her sister. She feels like she is doing ok. Her weight is continuing to go down - about a pound a day. No bleeding. Breathing has improved. The cost of the medicine is pretty high - with the Xarelto and she was suppose to be on Jardiance (for the heart benefit). She has had several sleep studies in the remote past - has had documented OSA but her OSA is not currently treated. She has no equipment currently. She would like to be reassessed. She was told  she would see Dr. Radford Pax - this has not been arranged yet. No real awareness of her atrial flutter. Not dizzy. No bleeding. Needs labs today. Asking about using Tylenol for pain.   Past Medical History:  Diagnosis Date  . Acute on chronic diastolic (congestive) heart failure ([place]) 06/2018  . Anxiety    takes Citalopram daily  . Arthritis    generalized  . Basal cell carcinoma   . GERD (gastroesophageal reflux disease)    takes Omeprazole daily  . History of Bell's palsy    left side  . History of bronchitis   . History of hiatal hernia   . History of kidney stones   . Hyperlipemia    takes Simvastatin daily  . Hypertension    takes Enalapril daily  . Hypothyroidism   . Incontinence    takes Ditropan daily  . Joint swelling    left knee  . Neuropathy    takes Gabapentin daily  . PONV (postoperative nausea and vomiting)   . RLS (restless legs syndrome)   . Seasonal allergies    takes Claritin daioy as needed  . Sleep apnea    pt. states that she does not have a CPAP  . Tremor   . Type 2 diabetes mellitus (HCC)    takes Metformin and Amaryl daily    Past Surgical History:  Procedure  Laterality Date  . ACHILLES TENDON REPAIR Right   . APPENDECTOMY    . BASAL CELL CARCINOMA EXCISION     Nose  . BREAST LUMPECTOMY    . CARDIAC CATHETERIZATION     pt. states approximately 15 years ago  . CARDIOVERSION N/A 06/22/2018   Procedure: CARDIOVERSION;  Surgeon: Buford Dresser, MD;  Location: [facility] ENDOSCOPY;  Service: Cardiovascular;  Laterality: N/A;  . CARPAL TUNNEL RELEASE Bilateral   . DILATION AND CURETTAGE OF UTERUS    . EYE SURGERY Bilateral    cataract surgery  . KNEE ARTHROSCOPY     Right  . LEFT HEART CATH AND CORONARY ANGIOGRAPHY N/A 02/01/2017   Procedure: Left Heart Cath and Coronary Angiography;  Surgeon: Lorretta Harp, MD;  Location: [facility] CV LAB;  Service: Cardiovascular;  Laterality: N/A;  . LITHOTRIPSY  2013  . TEE WITHOUT CARDIOVERSION N/A  06/22/2018   Procedure: TRANSESOPHAGEAL ECHOCARDIOGRAM (TEE);  Surgeon: Buford Dresser, MD;  Location: [facility] ENDOSCOPY;  Service: Cardiovascular;  Laterality: N/A;  . TOTAL ABDOMINAL HYSTERECTOMY    . TOTAL KNEE ARTHROPLASTY Left 04/08/2015   Procedure: LEFT TOTAL KNEE ARTHROPLASTY;  Surgeon: Frederik Pear, MD;  Location: [facility];  Service: Orthopedics;  Laterality: Left;     Medications: Current Meds  Medication Sig  . atorvastatin (LIPITOR) 40 MG tablet Take 40 mg by mouth at bedtime.   . carvedilol (COREG) 3.125 MG tablet Take 1 tablet (3.125 mg total) by mouth 2 (two) times daily with a meal.  . cetirizine (ZYRTEC) 10 MG tablet Take 10 mg by mouth daily as needed (for allergies or allergic reactions).   . citalopram (CELEXA) 20 MG tablet Take 20 mg by mouth daily.  . enalapril (VASOTEC) 10 MG tablet Take 10 mg by mouth at bedtime.   . fluticasone (FLONASE) 50 MCG/ACT nasal spray Place 1-2 sprays into both nostrils daily as needed for allergies or rhinitis.  . furosemide (LASIX) 40 MG tablet Take 1 tablet (40 mg total) by mouth daily.  Marland Kitchen glimepiride (AMARYL) 2 MG tablet Take 2 mg by mouth daily before breakfast.   . metFORMIN (GLUCOPHAGE) 1000 MG tablet Take 1,000 mg by mouth 2 (two) times daily with a meal.  . omeprazole (PRILOSEC) 20 MG capsule Take 20 mg by mouth daily.  Marland Kitchen oxybutynin (DITROPAN XL) 15 MG 24 hr tablet Take 15 mg by mouth daily.  Vladimir Faster Glycol-Propyl Glycol (SYSTANE OP) Place 1-2 drops into both eyes daily.   . rivaroxaban (XARELTO) 20 MG TABS tablet Take 1 tablet (20 mg total) by mouth daily with supper.     Allergies: Allergies  Allergen Reactions  . Hydrocodone Other (See Comments)    Weird sensations mentally; "makes me fuzzy"  . Methocarbamol Hives  . Oxycodone Other (See Comments)    Weird sensations mentally; "makes me fuzzy"  . Latex Hives and Rash    Social History: The patient  reports that she has never smoked. She has never used smokeless  tobacco. She reports that she drinks alcohol. She reports that she does not use drugs.   Family History: The patient's family history includes CAD (age of onset: 65) in her brother; Heart disease in her father and mother; Heart disease (age of onset: 71) in her brother; Heart failure in her father; Heart failure (age of onset: 39) in her mother.   Review of Systems: Please see the history of present illness.   Otherwise, the review of systems is positive for none.   All other  systems are reviewed and negative.   Physical Exam: VS:  BP 112/70 (BP Location: Left Wrist, Patient Position: Sitting, Cuff Size: Normal)   Pulse 68   Ht 5\' 8"  (1.727 m)   Wt 290 lb 1.9 oz (131.6 kg)   BMI 44.11 kg/m  .  BMI Body mass index is 44.11 kg/m.  Wt Readings from Last 3 Encounters:  07/12/18 290 lb 1.9 oz (131.6 kg)  06/23/18 296 lb 8 oz (134.5 kg)  04/24/18 270 lb (122.5 kg)    General: Pleasant. She is morbidly obese. Alert and in no acute distress.  Weigh tis trending down.  HEENT: Normal.  Neck: Supple, no JVD, carotid bruits, or masses noted.  Cardiac: Irregular rhythm. HR is ok. Heart tones are distant. No significant edema.  Respiratory:  Decreased breath sounds but with normal work of breathing.  GI: Soft and nontender.  MS: No deformity or atrophy. Gait and ROM intact.  Skin: Warm and dry. Color is normal.  Neuro:  Strength and sensation are intact and no gross focal deficits noted.  Psych: Alert, appropriate and with normal affect.   LABORATORY DATA:  EKG:  EKG is ordered today. This demonstrates atrial flutter with controlled VR of 68  Lab Results  Component Value Date   WBC 7.4 06/21/2018   HGB 11.8 (L) 06/21/2018   HCT 37.7 06/21/2018   PLT 343 06/21/2018   GLUCOSE 163 (H) 06/23/2018   NA 140 06/23/2018   K 4.2 06/23/2018   CL 102 06/23/2018   CREATININE 1.06 (H) 06/23/2018   BUN 24 (H) 06/23/2018   CO2 28 06/23/2018   TSH 1.152 06/20/2018   INR 0.92 02/01/2017    HGBA1C 8.0 (H) 06/20/2018       BNP (last 3 results) Recent Labs    06/20/18 1255  BNP 111.9*    ProBNP (last 3 results) No results for input(s): PROBNP in the last 8760 hours.   Other Studies Reviewed Today:  TEE Study Conclusions 06/2018  - Left ventricle: Systolic function was normal. The estimated   ejection fraction was in the range of 50% to 55%. Wall motion was   normal; there were no regional wall motion abnormalities. No   evidence of thrombus. 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 - Mitral valve: There was mild regurgitation directed centrally. - Left atrium: No evidence of thrombus in the atrial cavity or   appendage. - Right atrium: No evidence of thrombus in the atrial cavity or   appendage. - Atrial septum: A patent foramen ovale cannot be excluded. Trivial   color flow noted at FO/intra-atrial septum. - Tricuspid valve: There was mild-moderate regurgitation. - Pulmonic valve: There was trivial regurgitation.  Impressions:  - Successful cardioversion. LAA and RA/RAA thoroughly interrogated,   due to muscular LAA and IAS shadowing into RA/RAA. No cardiac   source of emboli was indentified.   ECHOCARDIOGRAM: 03/2015 Study Conclusions - Left ventricle: The cavity size was normal. Systolic function was normal. The estimated ejection fraction was in the range of 55% to 60%. Wall motion was normal; there were no regional wall motion abnormalities. - Aortic valve: There was mild regurgitation. - Mitral valve: There was trivial regurgitation. - Left atrium: The atrium was moderately dilated. - Atrial septum: A patent foramen ovale cannot be excluded. - Tricuspid valve: There was trivial regurgitation. - Recommendations: Consider agitated saline contrast study to rule out PFO.   02/01/17  Left Heart Cath and Coronary Angiography  Conclusion    The  left ventricular systolic function is normal.  LV end diastolic pressure is normal.  The left ventricular ejection fraction is 55-65% by visual estimate.  Ms. Rennert has normal coronary arteries and normal LV function. I believe her chest pain is noncardiac. The sheath was removed and a TR band was placed on the right wrist to achieve patent hemostasis. The patient left the lab in stable condition. She'll be discharged home later today with outpatient follow-up.     Assessment/Plan:  1. Recent admission for acute on chronic diastolic HF - EF if 50 to 55%. She was diuresed over 7 liters - weight continues to go down. Suspected trigger is the atrial flutter. She is doing better clinically. Needs labs today. Needs OSA treated. Needs to continue with salt restriction, daily weights, etc.   2. Atrial flutter - short lived results after TEE CV - now managed with rate control and anticoagulation - CHA2DS2-VAScscore of 5 (CHF, HTN, DM Age 69-74, and female) - remains on anticoagulation with Xarelto - this may in fact turn out to be cost prohibitive - will see what assistance we can do.   3. Chronic anticoagulation - no problems noted - check lab today.   4. Prior normal cath.   5. HTN - BP is fine on her current regimen.   6. HLD - not discussed today.   7. Untreated OSA - discharge summary noted she would be referred to Dr. Radford Pax regarding her sleep apnea. Will place this today. She has had previously documented OSA - currently with no equipment being utilized.    8. Uncontrolled type II DM: will not be able to afford Jardiance - back on her original regimen despite the cardiovascular benefits - would favor better blood sugar control overall and CV risk factor modification. Weight loss will be key.   9. Morbid obesity/Body mass index is 45.22 kg/m - discussed at length - this is the crux of her issues - discussed at length.   10. Anemia:Probably due to chronic disease. Not discussed  today.   Current medicines are reviewed with the patient today.  The patient does not have concerns regarding medicines other than what has been noted above.  The following changes have been made:  See above.  Labs/ tests ordered today include:    Orders Placed This Encounter  Procedures  . Basic metabolic panel  . CBC  . EKG 12-Lead     Disposition:   FU with Dr. Marlou Porch in 3 months.    Patient is agreeable to this plan and will call if any problems develop in the interim.   SignedTruitt Merle, NP  07/12/2018 3:18 PM  [facility] [address] [place], [place]  [address] Phone: [phone] Fax: [phone]

## 2018-07-12 NOTE — Progress Notes (Signed)
Error

## 2018-07-12 NOTE — Patient Instructions (Addendum)
We will be checking the following labs today - BMET & CBC   Medication Instructions:    Continue with your current medicines.  Ok to use Tylenol for pain  Will see if we have samples of Xarelto     Testing/Procedures To Be Arranged:  N/A  Follow-Up:   See Dr. Marlou Porch in about 3 months   Referral to Dr. Radford Pax for sleep apnea/CPAP equipment    Other Special Instructions:   Keep watching the salt, weighing daily, etc.     If you need a refill on your cardiac medications before your next appointment, please call your pharmacy.   Call the [place] office at [phone] if you have any questions, problems or concerns.

## 2018-07-13 ENCOUNTER — Telehealth: Payer: Self-pay | Admitting: *Deleted

## 2018-07-13 DIAGNOSIS — G4733 Obstructive sleep apnea (adult) (pediatric): Secondary | ICD-10-CM

## 2018-07-13 LAB — BASIC METABOLIC PANEL
BUN/Creatinine Ratio: 22 (ref 12–28)
BUN: 23 mg/dL (ref 8–27)
CO2: 22 mmol/L (ref 20–29)
Calcium: 9.8 mg/dL (ref 8.7–10.3)
Chloride: 98 mmol/L (ref 96–106)
Creatinine, Ser: 1.06 mg/dL — ABNORMAL HIGH (ref 0.57–1.00)
GFR calc Af Amer: 62 mL/min/{1.73_m2} (ref >59)
GFR calc non Af Amer: 54 mL/min/{1.73_m2} — ABNORMAL LOW (ref >59)
Glucose: 159 mg/dL — ABNORMAL HIGH (ref 65–99)
Potassium: 4.4 mmol/L (ref 3.5–5.2)
Sodium: 139 mmol/L (ref 134–144)

## 2018-07-13 LAB — CBC
Hematocrit: 40 % (ref 34.0–46.6)
Hemoglobin: 13.1 g/dL (ref 11.1–15.9)
MCH: 28.4 pg (ref 26.6–33.0)
MCHC: 32.8 g/dL (ref 31.5–35.7)
MCV: 87 fL (ref 79–97)
Platelets: 395 10*3/uL (ref 150–450)
RBC: 4.62 x10E6/uL (ref 3.77–5.28)
RDW: 15.4 % (ref 12.3–15.4)
WBC: 8.8 10*3/uL (ref 3.4–10.8)

## 2018-07-13 NOTE — Telephone Encounter (Signed)
Split night sleep study has been sent to pre cert. Patient will be called when the approval or denial comes back from insurance.

## 2018-07-13 NOTE — Telephone Encounter (Addendum)
-----   Message from [name] sent at 07/12/2018  3:12 PM EDT ----- Linda Washington,  Pt got diagnosed with sleep apnea in [place].  Does not have Cpap so starting over.  Can you please call pt to schedule sleep study.   tylene quashie [phone]  Thanks danielle

## 2018-07-14 ENCOUNTER — Ambulatory Visit (INDEPENDENT_AMBULATORY_CARE_PROVIDER_SITE_OTHER): Payer: Medicare HMO | Admitting: Orthotics

## 2018-07-14 DIAGNOSIS — M2011 Hallux valgus (acquired), right foot: Secondary | ICD-10-CM | POA: Diagnosis not present

## 2018-07-14 DIAGNOSIS — E119 Type 2 diabetes mellitus without complications: Secondary | ICD-10-CM | POA: Diagnosis not present

## 2018-07-14 DIAGNOSIS — M2010 Hallux valgus (acquired), unspecified foot: Secondary | ICD-10-CM

## 2018-07-14 DIAGNOSIS — M2012 Hallux valgus (acquired), left foot: Secondary | ICD-10-CM | POA: Diagnosis not present

## 2018-07-14 NOTE — Progress Notes (Signed)

## 2018-07-15 ENCOUNTER — Telehealth: Payer: Self-pay | Admitting: *Deleted

## 2018-07-15 NOTE — Addendum Note (Signed)
Addended by: Freada Bergeron on: 07/15/2018 01:16 PM   Modules accepted: Orders

## 2018-07-15 NOTE — Telephone Encounter (Signed)
PA submitted to [employer] via web portal for sleep study. 

## 2018-07-15 NOTE — Telephone Encounter (Signed)
-----   Message from Freada Bergeron, [place] sent at 07/13/2018 12:18 PM EDT ----- Regarding: pre cert Split night

## 2018-07-19 ENCOUNTER — Telehealth: Payer: Self-pay | Admitting: *Deleted

## 2018-07-19 DIAGNOSIS — I509 Heart failure, unspecified: Secondary | ICD-10-CM | POA: Diagnosis not present

## 2018-07-19 DIAGNOSIS — N3281 Overactive bladder: Secondary | ICD-10-CM | POA: Diagnosis not present

## 2018-07-19 DIAGNOSIS — E7800 Pure hypercholesterolemia, unspecified: Secondary | ICD-10-CM | POA: Diagnosis not present

## 2018-07-19 DIAGNOSIS — E1142 Type 2 diabetes mellitus with diabetic polyneuropathy: Secondary | ICD-10-CM | POA: Diagnosis not present

## 2018-07-19 DIAGNOSIS — K219 Gastro-esophageal reflux disease without esophagitis: Secondary | ICD-10-CM | POA: Diagnosis not present

## 2018-07-19 DIAGNOSIS — R69 Illness, unspecified: Secondary | ICD-10-CM | POA: Diagnosis not present

## 2018-07-19 DIAGNOSIS — Z23 Encounter for immunization: Secondary | ICD-10-CM | POA: Diagnosis not present

## 2018-07-19 DIAGNOSIS — G4730 Sleep apnea, unspecified: Secondary | ICD-10-CM | POA: Diagnosis not present

## 2018-07-19 DIAGNOSIS — M25531 Pain in right wrist: Secondary | ICD-10-CM | POA: Diagnosis not present

## 2018-07-19 NOTE — Telephone Encounter (Signed)
-----   Message from Freada Bergeron, [place] sent at 07/13/2018 12:18 PM EDT ----- Regarding: pre cert Split night

## 2018-07-19 NOTE — Telephone Encounter (Signed)
Staff message sent to [facility] received from [place]. Ok to schedule sleep study. Josem Kaufmann #[id number] Valid 07/18/18 to 10/16/18.

## 2018-07-20 ENCOUNTER — Telehealth: Payer: Self-pay | Admitting: *Deleted

## 2018-07-20 NOTE — Telephone Encounter (Signed)
Patient is scheduled for lab study on 08/22/18. Patient understands her sleep study will be done at [facility] sleep lab. Patient understands she will receive a sleep packet in a week or so. Patient understands to call if she does not receive the sleep packet in a timely manner.  Left detailed message on voicemail with date and time of sleep study and informed patient to call back to confirm or reschedule.

## 2018-07-20 NOTE — Telephone Encounter (Signed)
-----   Message from Lauralee Evener, [place] sent at 07/19/2018  9:14 AM EDT ----- Regarding: RE: pre cert Authorization received from [facility]. Ok to schedule sleep study. Auth # [id number] VALID 07/18/18 to 10/16/18. ----- Message ----- From: Freada Bergeron, CMA Sent: 07/13/2018  12:18 PM EDT To: Cv Div Sleep Studies Subject: pre cert                                       Split night

## 2018-07-25 DIAGNOSIS — M1811 Unilateral primary osteoarthritis of first carpometacarpal joint, right hand: Secondary | ICD-10-CM | POA: Diagnosis not present

## 2018-07-26 NOTE — Telephone Encounter (Signed)
Return call: Patient called back to confirm her 11/4 appointment.

## 2018-08-08 ENCOUNTER — Other Ambulatory Visit: Payer: Self-pay | Admitting: Cardiology

## 2018-08-08 MED ORDER — CARVEDILOL 3.125 MG PO TABS: 3.1250 mg | ORAL_TABLET | Freq: Two times a day (BID) | ORAL | 3 refills | Status: DC

## 2018-08-08 MED ORDER — FUROSEMIDE 40 MG PO TABS: 40.0000 mg | ORAL_TABLET | Freq: Every day | ORAL | 3 refills | Status: DC

## 2018-08-22 ENCOUNTER — Other Ambulatory Visit: Payer: Self-pay | Admitting: Cardiology

## 2018-08-22 ENCOUNTER — Ambulatory Visit (HOSPITAL_BASED_OUTPATIENT_CLINIC_OR_DEPARTMENT_OTHER): Payer: Medicare HMO | Attending: Nurse Practitioner | Admitting: Cardiology

## 2018-08-22 DIAGNOSIS — G4733 Obstructive sleep apnea (adult) (pediatric): Secondary | ICD-10-CM

## 2018-08-22 DIAGNOSIS — Z79899 Other long term (current) drug therapy: Secondary | ICD-10-CM | POA: Diagnosis not present

## 2018-08-22 DIAGNOSIS — I4892 Unspecified atrial flutter: Secondary | ICD-10-CM | POA: Diagnosis not present

## 2018-08-22 MED ORDER — FUROSEMIDE 40 MG PO TABS: 40.0000 mg | ORAL_TABLET | Freq: Every day | ORAL | 3 refills | Status: DC

## 2018-08-22 NOTE — Telephone Encounter (Signed)
Pt's medication was sent to pt's pharmacy as requested. Confirmation received.  °

## 2018-08-22 NOTE — Telephone Encounter (Signed)
°*  STAT* If patient is at the pharmacy, call can be transferred to refill team.   1. Which medications need to be refilled? (please list name of each medication and dose if known) furosemide 40 mg  2. Which pharmacy/location (including street and city if local pharmacy) is medication to be sent to?[facility] [address] [place]   3. Do they need a 30 day or 90 day supply? [place]

## 2018-08-23 DIAGNOSIS — M654 Radial styloid tenosynovitis [de Quervain]: Secondary | ICD-10-CM | POA: Diagnosis not present

## 2018-08-23 DIAGNOSIS — M2042 Other hammer toe(s) (acquired), left foot: Secondary | ICD-10-CM | POA: Diagnosis not present

## 2018-08-23 DIAGNOSIS — I509 Heart failure, unspecified: Secondary | ICD-10-CM | POA: Diagnosis not present

## 2018-08-23 DIAGNOSIS — E1142 Type 2 diabetes mellitus with diabetic polyneuropathy: Secondary | ICD-10-CM | POA: Diagnosis not present

## 2018-08-23 DIAGNOSIS — Z5181 Encounter for therapeutic drug level monitoring: Secondary | ICD-10-CM | POA: Diagnosis not present

## 2018-08-24 ENCOUNTER — Telehealth: Payer: Self-pay | Admitting: Cardiology

## 2018-08-24 MED ORDER — FUROSEMIDE 40 MG PO TABS: 40.0000 mg | ORAL_TABLET | Freq: Every day | ORAL | 3 refills | Status: DC

## 2018-08-24 NOTE — Telephone Encounter (Signed)
New message  Pt c/o medication issue:  1. Name of Medication: furosemide (LASIX) 40 MG tablet  2. How are you currently taking this medication (dosage and times per day)? 1 time daily  3. Are you having a reaction (difficulty breathing--STAT)?no  4. What is your medication issue? Patient has ran out of her medication due to taking 2 on days when she had swelling. Patient states that it is not time for her to get a refill. Please advise.

## 2018-08-24 NOTE — Telephone Encounter (Signed)
Spoke with the patient, the insurance would not fill the prescription, due to the dose change not being in the system. Reentered the prescription explaining the dose change. Spoke with patient and she expressed understanding.

## 2018-08-24 NOTE — Procedures (Signed)
   Patient Name: Linda Washington, Linda Washington Date: 08/22/2018 Gender: Female D.O.B: 01-31-1951 Age (years): 67 Referring Provider: Truitt Merle Height (inches): 86 Interpreting Physician: Fransico Him MD, ABSM Weight (lbs): 284 RPSGT: Gerhard Perches BMI: 43 MRN: [id number] Neck Size: 15.50  CLINICAL INFORMATION  Sleep Study Type: Split Night CPAP  Indication for sleep study: OSA  Epworth Sleepiness Score: 9  SLEEP STUDY TECHNIQUE  As per the AASM Manual for the Scoring of Sleep and Associated Events v2.3 (April 2016) with a hypopnea requiring 4% desaturations. The channels recorded and monitored were frontal, central and occipital EEG, electrooculogram (EOG), submentalis EMG (chin), nasal and oral airflow, thoracic and abdominal wall motion, anterior tibialis EMG, snore microphone, electrocardiogram, and pulse oximetry. Continuous positive airway pressure (CPAP) was initiated when the patient met split night criteria and was titrated according to treat sleep-disordered breathing.  MEDICATIONS  Medications self-administered by patient taken the night of the study : ENALAPRIL, ATORVASTATIN  RESPIRATORY PARAMETERS  Diagnostic Total AHI (/hr):30.7 RDI (/hr):34.1 CA Index (/hr):0.0 REM AHI (/hr):32.2 NREM AHI (/hr):30.4 Supine AHI (/hr):44.4 Non-supine AHI (/hr):11.5 Min O2 Sat (%):78.0 Mean O2 (%):92.2 Time below 88% (min):7.1  Titration Optimal Pressure (cm):N/A AHI at Optimal Pressure (/hr):N/A Min O2 at Optimal Pressure (%):N/A Supine % at Optimal (%):N/A Sleep % at Optimal (%):N/A   SLEEP ARCHITECTURE  The recording time for the entire night was 399.3 minutes. During a baseline period of 207.5 minutes, the patient slept for 125.0 minutes in REM and nonREM, yielding a sleep efficiency of 60.2%%. Sleep onset after lights out was 14.9 minutes with a REM latency of 7.5 minutes. The patient spent 21.6%% of the night in stage N1 sleep, 46.4%% in stage N2 sleep, 15.6%% in stage  N3 and 16.4% in REM.  During the titration period of 0.0 minutes, the patient slept for 0.0 minutes in REM and nonREM, yielding a sleep efficiency of N/A%. Sleep onset after CPAP initiation was N/A minutes with a REM latency of N/A minutes. The patient spent N/A% of the night in stage N1 sleep, N/A% in stage N2 sleep, N/A% in stage N3 and 0% in REM.  CARDIAC DATA  The 2 lead EKG demonstrated sinus rhythm. The mean heart rate was 100.0 beats per minute. Other EKG findings include: Atrial Flutter.  LEG MOVEMENT DATA  The total Periodic Limb Movements of Sleep (PLMS) were 0. The PLMS index was 0.0 .  IMPRESSIONS  - Severe obstructive sleep apnea occurred during the diagnostic portion of the study (AHI = 30.7/hour). An optimal PAP pressure could not be selected for this patient based on the available study data. - No significant central sleep apnea occurred during the diagnostic portion of the study (CAI = 0.0/hour). - The patient snored with loud snoring volume during the diagnostic portion of the study. - EKG findings include Atrial Flutter. - Clinically significant periodic limb movements did not occur during sleep.  DIAGNOSIS  - Obstructive Sleep Apnea (327.23 [G47.33 ICD-10])  RECOMMENDATIONS  - Recommend BiPAP titration given sub-optimal CPAP titration. - Avoid alcohol, sedatives and other CNS depressants that may worsen sleep apnea and disrupt normal sleep architecture. - Sleep hygiene should be reviewed to assess factors that may improve sleep quality. - Weight management and regular exercise should be initiated or continued.  [Electronically signed] 08/24/2018 08:44 PM  Fransico Him MD, ABSM Diplomate, American Board of Sleep Medicine

## 2018-08-26 ENCOUNTER — Telehealth: Payer: Self-pay | Admitting: *Deleted

## 2018-08-26 DIAGNOSIS — G4733 Obstructive sleep apnea (adult) (pediatric): Secondary | ICD-10-CM

## 2018-08-26 NOTE — Telephone Encounter (Signed)
Informed patient of titration results and verbalized understanding was indicated. Patient understands her titration study showed they have sleep apnea but had an unsuccessful CPAP titration due to severity of OSA and recommend BiPAP titration. Pt is aware and agreeable to these results.  Bipap titration sent to precert

## 2018-08-26 NOTE — Telephone Encounter (Signed)
-----   Message from Sueanne Margarita, MD sent at 08/24/2018  8:47 PM EST ----- Please let patient know that they have sleep apnea but had an unsuccessful CPAP titration due to severity of OSA and recommend BiPAP titration. Please set up titration in the sleep lab.

## 2018-08-29 ENCOUNTER — Telehealth: Payer: Self-pay | Admitting: *Deleted

## 2018-08-29 NOTE — Telephone Encounter (Signed)
-----   Message from Freada Bergeron, [place] sent at 08/26/2018  1:01 PM EST ----- Regarding: pre cert  recommend BiPAP titration.

## 2018-08-29 NOTE — Telephone Encounter (Signed)
PA request for BIPAP submitted to [facility] via web portal.

## 2018-08-30 ENCOUNTER — Telehealth: Payer: Self-pay | Admitting: *Deleted

## 2018-08-30 ENCOUNTER — Encounter: Payer: Self-pay | Admitting: *Deleted

## 2018-08-30 NOTE — Telephone Encounter (Signed)
Patient is scheduled for BiPAP Titration on 10/13/18. Patient understands her titration study will be done at [facility] sleep lab. Patient understands she will receive a letter in a week or so detailing appointment, date, time, and location. Patient understands to call if she does not receive the letter  in a timely manner. Patient agrees with treatment and thanked me for call.

## 2018-08-30 NOTE — Telephone Encounter (Signed)
Staff message sent to [facility] authorization received. Ok to schedule BIPAP titration. Auth # [id number]. Valid 08/29/18 to 11/27/18.

## 2018-08-30 NOTE — Telephone Encounter (Signed)
  Lauralee Evener, CMA  Freada Bergeron, [place] approval received. Ok to schedule BIPAP titration. Authorization # [id number] Valid dates 08/29/18 to 11/27/18.     ----- Message -----  From: Freada Bergeron, CMA  Sent: 08/26/2018  1:01 PM EST  To: Cv Div Sleep Studies  Subject: pre cert                     recommend BiPAP titration.

## 2018-08-30 NOTE — Telephone Encounter (Signed)
-----   Message from Freada Bergeron, [place] sent at 08/26/2018  1:01 PM EST ----- Regarding: pre cert  recommend BiPAP titration.

## 2018-10-04 ENCOUNTER — Ambulatory Visit: Payer: Medicare HMO | Admitting: Cardiology

## 2018-10-04 ENCOUNTER — Encounter: Payer: Self-pay | Admitting: Cardiology

## 2018-10-04 VITALS — BP 112/68 | HR 71 | Ht 68.0 in | Wt 278.8 lb

## 2018-10-04 DIAGNOSIS — M1811 Unilateral primary osteoarthritis of first carpometacarpal joint, right hand: Secondary | ICD-10-CM | POA: Diagnosis not present

## 2018-10-04 DIAGNOSIS — G4733 Obstructive sleep apnea (adult) (pediatric): Secondary | ICD-10-CM

## 2018-10-04 DIAGNOSIS — I4892 Unspecified atrial flutter: Secondary | ICD-10-CM | POA: Diagnosis not present

## 2018-10-04 DIAGNOSIS — M654 Radial styloid tenosynovitis [de Quervain]: Secondary | ICD-10-CM | POA: Diagnosis not present

## 2018-10-04 NOTE — Progress Notes (Signed)
Cardiology Office Note:    Date:  10/04/2018   ID:  Linda Washington, DOB June 27, 1951, MRN [id number]  PCP:  Marda Stalker, PA-C  Cardiologist:  Candee Furbish, MD  Electrophysiologist:  None   Referring MD: Marda Stalker, PA-C     History of Present Illness:    Linda Washington is a 67 y.o. female here for follow-up of paroxysmal, postoperative atrial fibrillation following knee surgery.  Was admitted at one point for acute diastolic heart failure found to be in atrial flutter and diuresed and underwent TEE cardioversion but reverted back to atrial flutter the next day.  Rate control.  Anticoagulation.  OSA - needs Bibap  Atrial flutter ventricular rate 68  Overall been feeling quite well.  No chest pain, no shortness of breath.  Taking her medications.  Having some dental issues.  Past Medical History:  Diagnosis Date  . Acute on chronic diastolic (congestive) heart failure ([place]) 06/2018  . Anxiety    takes Citalopram daily  . Arthritis    generalized  . Basal cell carcinoma   . GERD (gastroesophageal reflux disease)    takes Omeprazole daily  . History of Bell's palsy    left side  . History of bronchitis   . History of hiatal hernia   . History of kidney stones   . Hyperlipemia    takes Simvastatin daily  . Hypertension    takes Enalapril daily  . Hypothyroidism   . Incontinence    takes Ditropan daily  . Joint swelling    left knee  . Neuropathy    takes Gabapentin daily  . PONV (postoperative nausea and vomiting)   . RLS (restless legs syndrome)   . Seasonal allergies    takes Claritin daioy as needed  . Sleep apnea    pt. states that she does not have a CPAP  . Tremor   . Type 2 diabetes mellitus (HCC)    takes Metformin and Amaryl daily    Past Surgical History:  Procedure Laterality Date  . ACHILLES TENDON REPAIR Right   . APPENDECTOMY    . BASAL CELL CARCINOMA EXCISION     Nose  . BREAST LUMPECTOMY    . CARDIAC CATHETERIZATION     pt. states  approximately 15 years ago  . CARDIOVERSION N/A 06/22/2018   Procedure: CARDIOVERSION;  Surgeon: Buford Dresser, MD;  Location: [facility] ENDOSCOPY;  Service: Cardiovascular;  Laterality: N/A;  . CARPAL TUNNEL RELEASE Bilateral   . DILATION AND CURETTAGE OF UTERUS    . EYE SURGERY Bilateral    cataract surgery  . KNEE ARTHROSCOPY     Right  . LEFT HEART CATH AND CORONARY ANGIOGRAPHY N/A 02/01/2017   Procedure: Left Heart Cath and Coronary Angiography;  Surgeon: Lorretta Harp, MD;  Location: [facility];  Service: Cardiovascular;  Laterality: N/A;  . LITHOTRIPSY  2013  . TEE WITHOUT CARDIOVERSION N/A 06/22/2018   Procedure: TRANSESOPHAGEAL ECHOCARDIOGRAM (TEE);  Surgeon: Buford Dresser, MD;  Location: [facility] ENDOSCOPY;  Service: Cardiovascular;  Laterality: N/A;  . TOTAL ABDOMINAL HYSTERECTOMY    . TOTAL KNEE ARTHROPLASTY Left 04/08/2015   Procedure: LEFT TOTAL KNEE ARTHROPLASTY;  Surgeon: Frederik Pear, MD;  Location: [place];  Service: Orthopedics;  Laterality: Left;    Current Medications: Current Meds  Medication Sig  . amoxicillin (AMOXIL) 500 MG capsule Take 500 mg by mouth 4 (four) times daily.  Marland Kitchen atorvastatin (LIPITOR) 40 MG tablet Take 40 mg by mouth at bedtime.   Marland Kitchen  carvedilol (COREG) 3.125 MG tablet Take 1 tablet (3.125 mg total) by mouth 2 (two) times daily with a meal.  . cetirizine (ZYRTEC) 10 MG tablet Take 10 mg by mouth daily as needed (for allergies or allergic reactions).   . citalopram (CELEXA) 20 MG tablet Take 20 mg by mouth daily.  . enalapril (VASOTEC) 10 MG tablet Take 10 mg by mouth at bedtime.   . fluticasone (FLONASE) 50 MCG/ACT nasal spray Place 1-2 sprays into both nostrils daily as needed for allergies or rhinitis.  . furosemide (LASIX) 40 MG tablet Take 1 tablet (40 mg total) by mouth daily.  Marland Kitchen glimepiride (AMARYL) 2 MG tablet Take 2 mg by mouth daily before breakfast.   . HYDROcodone-acetaminophen (NORCO/VICODIN) 5-325 MG tablet Take 1 tablet by  mouth every 6 (six) hours as needed for moderate pain.  . metFORMIN (GLUCOPHAGE) 1000 MG tablet Take 1,000 mg by mouth 2 (two) times daily with a meal.  . omeprazole (PRILOSEC) 20 MG capsule Take 20 mg by mouth daily.  Marland Kitchen oxybutynin (DITROPAN XL) 15 MG 24 hr tablet Take 15 mg by mouth daily.  Vladimir Faster Glycol-Propyl Glycol (SYSTANE OP) Place 1-2 drops into both eyes daily.   . rivaroxaban (XARELTO) 20 MG TABS tablet Take 1 tablet (20 mg total) by mouth daily with supper.     Allergies:   Hydrocodone; Methocarbamol; Oxycodone; and Latex   Social History   Socioeconomic History  . Marital status: Divorced    Spouse name: Not on file  . Number of children: Not on file  . Years of education: Some colle  . Highest education level: Not on file  Occupational History  . Occupation: part-time Research scientist (physical sciences) at [employer]  . Financial resource strain: Not on file  . Food insecurity:    Worry: Not on file    Inability: Not on file  . Transportation needs:    Medical: Not on file    Non-medical: Not on file  Tobacco Use  . Smoking status: Never Smoker  . Smokeless tobacco: Never Used  Substance and Sexual Activity  . Alcohol use: Yes    Alcohol/week: 0.0 standard drinks    Comment: Rare-wine  . Drug use: No  . Sexual activity: Not on file  Lifestyle  . Physical activity:    Days per week: Not on file    Minutes per session: Not on file  . Stress: Not on file  Relationships  . Social connections:    Talks on phone: Not on file    Gets together: Not on file    Attends religious service: Not on file    Active member of club or organization: Not on file    Attends meetings of clubs or organizations: Not on file    Relationship status: Not on file  Other Topics Concern  . Not on file  Social History Narrative   Lives at home with Lonn Georgia, roommate   Caffeine use: 1 Coffee day, 1-2 a day of tea, 1-2 sodas a day      Family History: The patient's family  history includes CAD (age of onset: 68) in her brother; Heart disease in her father and mother; Heart disease (age of onset: 13) in her brother; Heart failure in her father; Heart failure (age of onset: 92) in her mother.  ROS:   Please see the history of present illness.    Positive for snoring anxiety easy bruising difficulty urinating all other systems reviewed and are  negative.  EKGs/Labs/Other Studies Reviewed:    The following studies were reviewed today:  TEE Study Conclusions 06/2018  - Left ventricle: Systolic function was normal. The estimated ejection fraction was in the range of 50% to 55%. Wall motion was normal; there were no regional wall motion abnormalities. No evidence of thrombus. - Aortic valve: There was mild regurgitation originating from the commissure between the left coronary and noncoronary cusps. - Mitral valve: There was mild regurgitation directed centrally. - Left atrium: No evidence of thrombus in the atrial cavity or appendage. - Right atrium: No evidence of thrombus in the atrial cavity or appendage. - Atrial septum: A patent foramen ovale cannot be excluded. Trivial color flow noted at FO/intra-atrial septum. - Tricuspid valve: There was mild-moderate regurgitation. - Pulmonic valve: There was trivial regurgitation.  Impressions:  - Successful cardioversion. LAA and RA/RAA thoroughly interrogated, due to muscular LAA and IAS shadowing into RA/RAA. No cardiac source of emboli was indentified.   02/01/17 Left Heart Cath and Coronary Angiography  Conclusion    The left ventricular systolic function is normal.  LV end diastolic pressure is normal.  The left ventricular ejection fraction is 55-65% by visual estimate.  Ms. Kirchoff has normal coronary arteries and normal LV function. I believe her chest pain is noncardiac. The sheath was removed and a TR band was placed on the right wrist to achieve patent hemostasis. The  patient left the lab in stable condition. She'll be discharged home later today with outpatient follow-up.      EKG:  EKG is not ordered today.   Recent Labs: 06/20/2018: B Natriuretic Peptide 111.9; TSH 1.152 06/22/2018: Magnesium 1.8 07/12/2018: BUN 23; Creatinine, Ser 1.06; Hemoglobin 13.1; Platelets 395; Potassium 4.4; Sodium 139  Recent Lipid Panel No results found for: CHOL, TRIG, HDL, CHOLHDL, VLDL, LDLCALC, LDLDIRECT  Physical Exam:    VS:  BP 112/68   Pulse 71   Ht 5\' 8"  (1.727 m)   Wt 278 lb 12.8 oz (126.5 kg)   SpO2 94%   BMI 42.39 kg/m     Wt Readings from Last 3 Encounters:  10/04/18 278 lb 12.8 oz (126.5 kg)  08/22/18 290 lb (131.5 kg)  07/12/18 290 lb 1.9 oz (131.6 kg)     GEN:  Obese Well nourished, well developed in no acute distress HEENT: Normal NECK: No JVD; No carotid bruits LYMPHATICS: No lymphadenopathy CARDIAC: RRR, no murmurs, rubs, gallops RESPIRATORY:  Clear to auscultation without rales, wheezing or rhonchi  ABDOMEN: Soft, non-tender, non-distended MUSCULOSKELETAL:  No edema; No deformity  SKIN: Warm and dry NEUROLOGIC:  Alert and oriented x 3 PSYCHIATRIC:  Normal affect   ASSESSMENT:    1. Atrial flutter, unspecified type ([place])   2. Morbid obesity ([place])   3. OSA (obstructive sleep apnea)    PLAN:    In order of problems listed above:  Persistent atrial flutter - TEE cardioversion was only successful for a few hours.  She is now managed with rate control.  Chads vas score 5.  Xarelto.  Overall doing quite well.  Last hemoglobin 13.1.  Dental work - Dr. Rona Ravens.  She may have dental work done.  If one tooth is extracted, she may do this while continuing the Xarelto.  If multiple teeth were extracted, I would recommend holding the Xarelto for 2 days prior to dental extraction.  Chronic diastolic heart failure -50 to 55% EF diuresed 7 L during her prior hospitalization.  Trigger likely was atrial flutter.  Also needs  obstructive sleep apnea  treated.  Essential hypertension -Currently well controlled.  Hyperlipidemia -Stable.  No changes.  Obstructive sleep apnea - Dr. Radford Pax.  Sleep study 08/22/2018- recommended BiPAP titration given suboptimal CPAP titration.  Morbid obesity - Good job with weight loss.  Continue.     Medication Adjustments/Labs and Tests Ordered: Current medicines are reviewed at length with the patient today.  Concerns regarding medicines are outlined above.  No orders of the defined types were placed in this encounter.  No orders of the defined types were placed in this encounter.   Patient Instructions  Medication Instructions:  Your provider recommends that you continue on your current medications as directed. Please refer to the Current Medication list given to you today.    Labwork: None  Testing/Procedures: None  Follow-Up: Your provider wants you to follow-up in: 6 months with Truitt Merle, NP. You will receive a reminder letter in the mail two months in advance. If you don't receive a letter, please call our office to schedule the follow-up appointment.    Your provider wants you to follow-up in: 1 year with Dr. Marlou Porch. You will receive a reminder letter in the mail two months in advance. If you don't receive a letter, please call our office to schedule the follow-up appointment.    Any Other Special Instructions Will Be Listed Below (If Applicable). You are cleared from a cardiac prospective to have teeth pulled. If only one tooth is pulled, you do not need to hold Xarelto. If more than one tooth is pulled, you will need to [place] for 2 days prior to the procedure.    Signed, Candee Furbish, MD  10/04/2018 4:28 PM    [place]

## 2018-10-04 NOTE — Patient Instructions (Signed)
Medication Instructions:  Your provider recommends that you continue on your current medications as directed. Please refer to the Current Medication list given to you today.    Labwork: None  Testing/Procedures: None  Follow-Up: Your provider wants you to follow-up in: 6 months with Truitt Merle, NP. You will receive a reminder letter in the mail two months in advance. If you don't receive a letter, please call our office to schedule the follow-up appointment.    Your provider wants you to follow-up in: 1 year with Dr. Marlou Porch. You will receive a reminder letter in the mail two months in advance. If you don't receive a letter, please call our office to schedule the follow-up appointment.    Any Other Special Instructions Will Be Listed Below (If Applicable). You are cleared from a cardiac prospective to have teeth pulled. If only one tooth is pulled, you do not need to hold Xarelto. If more than one tooth is pulled, you will need to [place] for 2 days prior to the procedure.

## 2018-10-13 ENCOUNTER — Encounter (HOSPITAL_BASED_OUTPATIENT_CLINIC_OR_DEPARTMENT_OTHER): Payer: Medicare HMO

## 2018-10-14 ENCOUNTER — Telehealth: Payer: Self-pay | Admitting: Cardiology

## 2018-10-14 NOTE — Telephone Encounter (Signed)
  Pt c/o medication issue:  1. Name of Medication: rivaroxaban (XARELTO) 20 MG TABS tablet  2. How are you currently taking this medication (dosage and times per day)? Take 1 tablet (20 mg total) by mouth daily with supper  3. Are you having a reaction (difficulty breathing--STAT)?  No  4. What is your medication issue? Patient is having some bleeding issues and would like to ask some questions

## 2018-10-14 NOTE — Telephone Encounter (Signed)
Spoke with patient who had 1 tooth removed while continuing Xarelto and she reports it took her mostall day and into the evening (until midnight) to get it to stop bleeding.  She is concerned because she has 7 more teeth that need to be removed and is wondering if OK to do that with the amount of bleeding she had from just one extraction.  She also recently had a skin tear that took quite a lot of time to get to stop bleeding as well.  Advised I will forward this information to Dr Marlou Porch and c/b with any recommendations.  Pt was grateful for the c/b and information.

## 2018-10-17 NOTE — Telephone Encounter (Signed)
If she is having several teeth removed, it would be fine to hold the Xarelto 1 day prior and resume the day following.  Hold pressure on skin tears to stop bleeding. Candee Furbish, MD

## 2018-10-17 NOTE — Telephone Encounter (Signed)
Left detailed message on VM (OK per DPR on file) to hold Xarelto 1 day prior and resume day following dental extractions.  Also advised to hold pressure on skin tears to stop the bleeding.  Requested she c/b with any questions or concerns.

## 2018-10-26 ENCOUNTER — Ambulatory Visit (HOSPITAL_BASED_OUTPATIENT_CLINIC_OR_DEPARTMENT_OTHER): Payer: Medicare HMO | Attending: Cardiology | Admitting: Cardiology

## 2018-10-26 VITALS — Ht 68.0 in | Wt 273.0 lb

## 2018-10-26 DIAGNOSIS — G4733 Obstructive sleep apnea (adult) (pediatric): Secondary | ICD-10-CM | POA: Diagnosis not present

## 2018-11-06 NOTE — Procedures (Signed)
   Patient Name: Linda Washington, Linda Washington Date: 10/26/2018 Gender: Female D.O.B: May 30, 1951 Age (years): 31 Referring Provider: Fransico Him MD, ABSM Height (inches): 68 Interpreting Physician: Fransico Him MD, ABSM Weight (lbs): 273 RPSGT: Carolin Coy BMI: 42 MRN: [id number] Neck Size: 15.00  CLINICAL INFORMATION The patient is referred for a BiPAP titration to treat sleep apnea.  SLEEP STUDY TECHNIQUE As per the AASM Manual for the Scoring of Sleep and Associated Events v2.3 (April 2016) with a hypopnea requiring 4% desaturations.  The channels recorded and monitored were frontal, central and occipital EEG, electrooculogram (EOG), submentalis EMG (chin), nasal and oral airflow, thoracic and abdominal wall motion, anterior tibialis EMG, snore microphone, electrocardiogram, and pulse oximetry. Bilevel positive airway pressure (BPAP) was initiated at the beginning of the study and titrated to treat sleep-disordered breathing.  MEDICATIONS Medications self-administered by patient taken the night of the study : LIPITOR, VASOTEC  RESPIRATORY PARAMETERS Optimal IPAP Pressure (cm): 16  AHI at Optimal Pressure (/hr) 2.7 Optimal EPAP Pressure (cm):12  Overall Minimal O2 (%):86.0  Minimal O2 at Optimal Pressure (%): 89.0  SLEEP ARCHITECTURE Start Time:10:04:20 PM  Stop Time:4:51:57 AM  Total Time (min):407.6  Total Sleep Time (min):295 Sleep Latency (min):43.7  Sleep Efficiency (%): 72.4% REM Latency (min): 149.5  WASO (min): 68.9 Stage N1 (%): 4.6%  Stage N2 (%): 73.6% Stage N3 (%): 0.0% Stage R (%):21.9 Supine (%):47.41  Arousal Index (/hr):12.0   CARDIAC DATA The 2 lead EKG demonstrated sinus rhythm. The mean heart rate was 62.4 beats per minute. Other EKG findings include: PVCs, Atrial Flutter.  LEG MOVEMENT DATA The total Periodic Limb Movements of Sleep (PLMS) were 0. The PLMS index was 0.0. A PLMS index of <15 is considered normal in adults.  IMPRESSIONS - An optimal PAP  pressure was selected for this patient (16/12cm of water) - Central sleep apnea was not noted during this titration (CAI = 0.0/h). - Moderete oxygen desaturations were observed during this titration (min O2 = 86.0%). - The patient snored with moderate snoring volume. - 2-lead EKG demonstrated: PVCs, Atrial Flutter - Clinically significant periodic limb movements were not noted during this study. Arousals associated with PLMs were rare.  DIAGNOSIS - Obstructive Sleep Apnea (327.23 [G47.33 ICD-10])  RECOMMENDATIONS - Trial of BiPAP therapy on 16/12 cm H2O with a Medium size Resmed Full Face Mask AirFit F30 mask and heated humidification. - Avoid alcohol, sedatives and other CNS depressants that may worsen sleep apnea and disrupt normal sleep architecture. - Sleep hygiene should be reviewed to assess factors that may improve sleep quality. - Weight management and regular exercise should be initiated or continued. - Return to [facility] Center for re-evaluation after 10 weeks of therapy  [Electronically signed] 11/06/2018 07:32 PM  Fransico Him MD, ABSM Diplomate, American Board of Sleep Medicine

## 2018-11-07 DIAGNOSIS — Z6841 Body Mass Index (BMI) 40.0 and over, adult: Secondary | ICD-10-CM | POA: Diagnosis not present

## 2018-11-07 DIAGNOSIS — I509 Heart failure, unspecified: Secondary | ICD-10-CM | POA: Diagnosis not present

## 2018-11-07 DIAGNOSIS — E1165 Type 2 diabetes mellitus with hyperglycemia: Secondary | ICD-10-CM | POA: Diagnosis not present

## 2018-11-07 DIAGNOSIS — E1142 Type 2 diabetes mellitus with diabetic polyneuropathy: Secondary | ICD-10-CM | POA: Diagnosis not present

## 2018-11-07 DIAGNOSIS — Z5181 Encounter for therapeutic drug level monitoring: Secondary | ICD-10-CM | POA: Diagnosis not present

## 2018-11-08 NOTE — Telephone Encounter (Signed)
Informed patient of titration results and verbalized understanding was indicated. Patient understands her titration study showed they had a successful PAP titration and orders are in EPIC. Please set up 10 week OV with me.   Upon patient request DME selection is [facility]. Patient understands she will be contacted by [facility] to set up her cpap. Patient understands to call if [facility] does not contact her with new setup in a timely manner. Patient understands they will be called once confirmation has been received from [facility] that they have received their new machine to schedule 10 week follow up appointment.  [facility] notified of new cpap order  Please add to airview Patient was grateful for the call and thanked me.

## 2018-12-02 DIAGNOSIS — G4733 Obstructive sleep apnea (adult) (pediatric): Secondary | ICD-10-CM | POA: Diagnosis not present

## 2018-12-16 DIAGNOSIS — G4733 Obstructive sleep apnea (adult) (pediatric): Secondary | ICD-10-CM | POA: Diagnosis not present

## 2018-12-26 DIAGNOSIS — L820 Inflamed seborrheic keratosis: Secondary | ICD-10-CM | POA: Diagnosis not present

## 2018-12-26 DIAGNOSIS — L821 Other seborrheic keratosis: Secondary | ICD-10-CM | POA: Diagnosis not present

## 2018-12-26 DIAGNOSIS — D1801 Hemangioma of skin and subcutaneous tissue: Secondary | ICD-10-CM | POA: Diagnosis not present

## 2018-12-26 DIAGNOSIS — D2361 Other benign neoplasm of skin of right upper limb, including shoulder: Secondary | ICD-10-CM | POA: Diagnosis not present

## 2018-12-26 DIAGNOSIS — L570 Actinic keratosis: Secondary | ICD-10-CM | POA: Diagnosis not present

## 2018-12-26 DIAGNOSIS — Z85828 Personal history of other malignant neoplasm of skin: Secondary | ICD-10-CM | POA: Diagnosis not present

## 2018-12-26 DIAGNOSIS — D485 Neoplasm of uncertain behavior of skin: Secondary | ICD-10-CM | POA: Diagnosis not present

## 2018-12-31 DIAGNOSIS — G4733 Obstructive sleep apnea (adult) (pediatric): Secondary | ICD-10-CM | POA: Diagnosis not present

## 2019-01-18 DIAGNOSIS — R69 Illness, unspecified: Secondary | ICD-10-CM | POA: Diagnosis not present

## 2019-01-18 DIAGNOSIS — K219 Gastro-esophageal reflux disease without esophagitis: Secondary | ICD-10-CM | POA: Diagnosis not present

## 2019-01-18 DIAGNOSIS — I10 Essential (primary) hypertension: Secondary | ICD-10-CM | POA: Diagnosis not present

## 2019-01-18 DIAGNOSIS — E1142 Type 2 diabetes mellitus with diabetic polyneuropathy: Secondary | ICD-10-CM | POA: Diagnosis not present

## 2019-01-18 DIAGNOSIS — N3281 Overactive bladder: Secondary | ICD-10-CM | POA: Diagnosis not present

## 2019-01-23 DIAGNOSIS — I509 Heart failure, unspecified: Secondary | ICD-10-CM | POA: Diagnosis not present

## 2019-01-23 DIAGNOSIS — Z5181 Encounter for therapeutic drug level monitoring: Secondary | ICD-10-CM | POA: Diagnosis not present

## 2019-01-23 DIAGNOSIS — E1142 Type 2 diabetes mellitus with diabetic polyneuropathy: Secondary | ICD-10-CM | POA: Diagnosis not present

## 2019-01-31 DIAGNOSIS — G4733 Obstructive sleep apnea (adult) (pediatric): Secondary | ICD-10-CM | POA: Diagnosis not present

## 2019-04-28 ENCOUNTER — Other Ambulatory Visit: Payer: Self-pay

## 2019-04-28 ENCOUNTER — Ambulatory Visit: Payer: Medicare HMO | Admitting: Podiatry

## 2019-04-28 ENCOUNTER — Encounter: Payer: Self-pay | Admitting: Podiatry

## 2019-04-28 VITALS — Temp 97.2°F

## 2019-04-28 DIAGNOSIS — E119 Type 2 diabetes mellitus without complications: Secondary | ICD-10-CM | POA: Diagnosis not present

## 2019-04-28 DIAGNOSIS — B351 Tinea unguium: Secondary | ICD-10-CM

## 2019-04-28 DIAGNOSIS — M2040 Other hammer toe(s) (acquired), unspecified foot: Secondary | ICD-10-CM

## 2019-04-28 DIAGNOSIS — M2010 Hallux valgus (acquired), unspecified foot: Secondary | ICD-10-CM

## 2019-04-28 DIAGNOSIS — M79675 Pain in left toe(s): Secondary | ICD-10-CM | POA: Diagnosis not present

## 2019-04-28 NOTE — Progress Notes (Signed)
Complaint:  Visit Type: Patient returns to my office for continued preventative foot care services. Complaint: Patient states" my nails have grown long and thick and become painful to walk and wear shoes" Patient has been diagnosed with DM with no foot complications. The patient presents for preventative foot care services. No changes to ROS  Podiatric Exam: Vascular: dorsalis pedis and posterior tibial pulses are palpable bilateral. Capillary return is immediate. Temperature gradient is WNL. Skin turgor WNL  Sensorium: Normal Semmes Weinstein monofilament test. Normal tactile sensation bilaterally. Nail Exam: Pt has thick disfigured discolored nails with subungual debris noted bilateral entire nail hallux through fifth toenails Ulcer Exam: There is no evidence of ulcer or pre-ulcerative changes or infection. Orthopedic Exam: Muscle tone and strength are WNL. No limitations in general ROM. No crepitus or effusions noted. Foot type and digits show no abnormalities. 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 No infection or ulcers  Diagnosis:  Onychomycosis, , Pain in right toe, pain in left toes,  Diabetes   Treatment & Plan Procedures and Treatment: Consent by patient was obtained for treatment procedures.   Debridement of mycotic and hypertrophic toenails, 1 through 5 bilateral and clearing of subungual debris. No ulceration, no infection noted.  Return Visit-Office Procedure: Patient instructed to return to the office for a follow up visit 3 months for continued evaluation and treatment.    Gardiner Barefoot DPM

## 2019-05-03 DIAGNOSIS — R1310 Dysphagia, unspecified: Secondary | ICD-10-CM | POA: Diagnosis not present

## 2019-05-03 DIAGNOSIS — R58 Hemorrhage, not elsewhere classified: Secondary | ICD-10-CM | POA: Diagnosis not present

## 2019-05-04 ENCOUNTER — Other Ambulatory Visit: Payer: Self-pay

## 2019-05-04 MED ORDER — FUROSEMIDE 40 MG PO TABS: 40.0000 mg | ORAL_TABLET | Freq: Every day | ORAL | 1 refills | Status: DC

## 2019-05-10 DIAGNOSIS — Z1211 Encounter for screening for malignant neoplasm of colon: Secondary | ICD-10-CM | POA: Diagnosis not present

## 2019-05-10 DIAGNOSIS — R432 Parageusia: Secondary | ICD-10-CM | POA: Diagnosis not present

## 2019-05-10 DIAGNOSIS — K219 Gastro-esophageal reflux disease without esophagitis: Secondary | ICD-10-CM | POA: Diagnosis not present

## 2019-05-10 DIAGNOSIS — R1314 Dysphagia, pharyngoesophageal phase: Secondary | ICD-10-CM | POA: Diagnosis not present

## 2019-05-12 ENCOUNTER — Other Ambulatory Visit: Payer: Self-pay | Admitting: Physician Assistant

## 2019-05-12 DIAGNOSIS — R1314 Dysphagia, pharyngoesophageal phase: Secondary | ICD-10-CM

## 2019-05-17 ENCOUNTER — Ambulatory Visit
Admission: RE | Admit: 2019-05-17 | Discharge: 2019-05-17 | Disposition: A | Discharge status: Home / Self Care | Payer: Medicare HMO | Source: Ambulatory Visit | Attending: Physician Assistant | Admitting: Physician Assistant

## 2019-05-17 DIAGNOSIS — K224 Dyskinesia of esophagus: Secondary | ICD-10-CM | POA: Diagnosis not present

## 2019-05-17 DIAGNOSIS — R1314 Dysphagia, pharyngoesophageal phase: Secondary | ICD-10-CM

## 2019-06-02 ENCOUNTER — Telehealth: Payer: Self-pay | Admitting: *Deleted

## 2019-06-02 DIAGNOSIS — Z7901 Long term (current) use of anticoagulants: Secondary | ICD-10-CM | POA: Diagnosis not present

## 2019-06-02 DIAGNOSIS — Z86010 Personal history of colonic polyps: Secondary | ICD-10-CM | POA: Diagnosis not present

## 2019-06-02 DIAGNOSIS — K224 Dyskinesia of esophagus: Secondary | ICD-10-CM | POA: Diagnosis not present

## 2019-06-02 DIAGNOSIS — R432 Parageusia: Secondary | ICD-10-CM | POA: Diagnosis not present

## 2019-06-02 DIAGNOSIS — R1310 Dysphagia, unspecified: Secondary | ICD-10-CM | POA: Diagnosis not present

## 2019-06-02 DIAGNOSIS — K5900 Constipation, unspecified: Secondary | ICD-10-CM | POA: Diagnosis not present

## 2019-06-02 DIAGNOSIS — K439 Ventral hernia without obstruction or gangrene: Secondary | ICD-10-CM | POA: Diagnosis not present

## 2019-06-02 DIAGNOSIS — K219 Gastro-esophageal reflux disease without esophagitis: Secondary | ICD-10-CM | POA: Diagnosis not present

## 2019-06-02 NOTE — Telephone Encounter (Signed)
Patient with diagnosis of afib on xarelto for anticoagulation.    Procedure:  COLONOSCOPY/ENDOSCOPY  Date of procedure: 07/06/2019  CHADS2-VASc score of  5 (CHF, HTN, AGE, DM2, female)  CrCl 25ml/min (adjusted)  Per office protocol, patient can hold Xarelto for 1-2 days prior to procedure.

## 2019-06-02 NOTE — Telephone Encounter (Signed)
Left message to call back  Kerin Ransom PA-C 06/02/2019 1:58 PM

## 2019-06-02 NOTE — Telephone Encounter (Signed)
   [facility] Pre-operative Risk Assessment    Request for surgical clearance:  1. What type of surgery is being performed? COLONOSCOPY/ENDOSCOPY   2. When is this surgery scheduled? 07/06/19   3. What type of clearance is required (medical clearance vs. Pharmacy clearance to hold med vs. Both)? BOTH  4. Are there any medications that need to be held prior to surgery and how long? [place]   5. Practice name and name of physician performing surgery? [facility]; DR. Paulita Fujita   6. What is your office phone number [phone]    7.   What is your office fax number [phone]  8.   Anesthesia type (None, local, MAC, general) ? NOT LISTED; PROPOFOL ?    Julaine Hua 06/02/2019, 1:33 PM  _________________________________________________________________   (provider comments below)

## 2019-06-05 NOTE — Telephone Encounter (Signed)
Follow up  ° ° °Patient is returning call.  °

## 2019-06-05 NOTE — Telephone Encounter (Signed)
   Primary Cardiologist: Candee Furbish, MD  Chart reviewed as part of pre-operative protocol coverage. Patient was contacted 06/05/2019 in reference to pre-operative risk assessment for pending surgery as outlined below.  Linda Washington was last seen on 10/2018 by Dr. Radford Pax.  Since that day, Linda Washington has done well.  Therefore, based on ACC/AHA guidelines, the patient would be at acceptable risk for the planned procedure without further cardiovascular testing.   I will route this recommendation to the requesting party via Epic fax function and remove from pre-op pool.  Please call with questions.  [place], [place] 06/05/2019, 2:13 PM

## 2019-06-05 NOTE — Telephone Encounter (Signed)
Follow Up:      Returning your call from today. 

## 2019-06-05 NOTE — Telephone Encounter (Signed)
Return called twice but went to voice both time.

## 2019-06-29 ENCOUNTER — Other Ambulatory Visit: Payer: Self-pay

## 2019-06-29 NOTE — Telephone Encounter (Signed)
[facility] ([place] is requesting 90 day refill of Xarelto. Please review for refill. Thank you!

## 2019-06-29 NOTE — Telephone Encounter (Signed)
*  STAT* If patient is at the pharmacy, call can be transferred to refill team.   1. Which medications need to be refilled? (please list name of each medication and dose if known) Xarelto  2. Which pharmacy/location (including street and city if local pharmacy) is medication to be sent to? [facility] [place]  3. Do they need a 30 day or 90 day supply? [place]

## 2019-06-30 MED ORDER — RIVAROXABAN 20 MG PO TABS: 20.0000 mg | ORAL_TABLET | Freq: Every day | ORAL | 0 refills | Status: DC

## 2019-06-30 NOTE — Addendum Note (Signed)
Addended by: Harrington Challenger on: 06/30/2019 08:59 AM   Modules accepted: Orders

## 2019-06-30 NOTE — Telephone Encounter (Signed)
68yo Female  Scr = 1.06 on 07/12/2018 Aflutter Dr Marlou Porch 10/04/2018

## 2019-07-03 DIAGNOSIS — Z1159 Encounter for screening for other viral diseases: Secondary | ICD-10-CM | POA: Diagnosis not present

## 2019-07-06 DIAGNOSIS — R1310 Dysphagia, unspecified: Secondary | ICD-10-CM | POA: Diagnosis not present

## 2019-07-06 DIAGNOSIS — D123 Benign neoplasm of transverse colon: Secondary | ICD-10-CM | POA: Diagnosis not present

## 2019-07-06 DIAGNOSIS — D122 Benign neoplasm of ascending colon: Secondary | ICD-10-CM | POA: Diagnosis not present

## 2019-07-06 DIAGNOSIS — K648 Other hemorrhoids: Secondary | ICD-10-CM | POA: Diagnosis not present

## 2019-07-06 DIAGNOSIS — Z86010 Personal history of colonic polyps: Secondary | ICD-10-CM | POA: Diagnosis not present

## 2019-07-11 DIAGNOSIS — D122 Benign neoplasm of ascending colon: Secondary | ICD-10-CM | POA: Diagnosis not present

## 2019-07-11 DIAGNOSIS — D123 Benign neoplasm of transverse colon: Secondary | ICD-10-CM | POA: Diagnosis not present

## 2019-08-16 ENCOUNTER — Other Ambulatory Visit: Payer: Self-pay | Admitting: Family Medicine

## 2019-08-16 DIAGNOSIS — Z1231 Encounter for screening mammogram for malignant neoplasm of breast: Secondary | ICD-10-CM

## 2019-08-29 ENCOUNTER — Ambulatory Visit: Payer: Medicare HMO | Admitting: Podiatry

## 2019-09-02 ENCOUNTER — Other Ambulatory Visit: Payer: Self-pay | Admitting: Cardiology

## 2019-09-04 ENCOUNTER — Other Ambulatory Visit: Payer: Self-pay | Admitting: Cardiology

## 2019-09-04 MED ORDER — CARVEDILOL 3.125 MG PO TABS: 3.1250 mg | ORAL_TABLET | Freq: Two times a day (BID) | ORAL | 0 refills | Status: DC

## 2019-09-04 NOTE — Telephone Encounter (Signed)
Pt's medication was sent to pt's pharmacy as requested. Confirmation received.  °

## 2019-09-18 ENCOUNTER — Other Ambulatory Visit: Payer: Self-pay

## 2019-09-18 MED ORDER — FUROSEMIDE 40 MG PO TABS: 40.0000 mg | ORAL_TABLET | Freq: Every day | ORAL | 0 refills | Status: DC

## 2019-09-18 NOTE — Telephone Encounter (Signed)
Refill for Furosemide sent to patients pharmacy.

## 2019-10-05 ENCOUNTER — Other Ambulatory Visit: Payer: Self-pay | Admitting: Cardiology

## 2019-10-05 ENCOUNTER — Ambulatory Visit
Admission: RE | Admit: 2019-10-05 | Discharge: 2019-10-05 | Disposition: A | Discharge status: Home / Self Care | Payer: Medicare HMO | Source: Ambulatory Visit | Attending: Family Medicine | Admitting: Family Medicine

## 2019-10-05 ENCOUNTER — Other Ambulatory Visit: Payer: Self-pay

## 2019-10-05 DIAGNOSIS — Z1231 Encounter for screening mammogram for malignant neoplasm of breast: Secondary | ICD-10-CM | POA: Diagnosis not present

## 2019-10-05 DIAGNOSIS — I4892 Unspecified atrial flutter: Secondary | ICD-10-CM

## 2019-10-05 DIAGNOSIS — Z79899 Other long term (current) drug therapy: Secondary | ICD-10-CM

## 2019-10-05 NOTE — Telephone Encounter (Signed)
Prescription refill request for Xarelto received.   Last office visit: 10/04/2018, [place] appt scheduled for 10/31/2019 Weight: 273 lbs Age:68 y.o. Scr: 1.04, 07/12/2018 CrCl:98 ml/min     Pt is voerdue for blood work. Called and spoke to pt scheduled pt to come in for labs after her appointment with Dr. Marlou Porch on 1/12/20221. Will refill with a 1 month supple of xarelto so she has enough to get her to her appointment.

## 2019-10-19 ENCOUNTER — Other Ambulatory Visit: Payer: Self-pay

## 2019-10-19 DIAGNOSIS — M47816 Spondylosis without myelopathy or radiculopathy, lumbar region: Secondary | ICD-10-CM | POA: Diagnosis not present

## 2019-10-19 DIAGNOSIS — I5030 Unspecified diastolic (congestive) heart failure: Secondary | ICD-10-CM | POA: Diagnosis not present

## 2019-10-19 DIAGNOSIS — E7800 Pure hypercholesterolemia, unspecified: Secondary | ICD-10-CM | POA: Diagnosis not present

## 2019-10-19 DIAGNOSIS — I509 Heart failure, unspecified: Secondary | ICD-10-CM | POA: Diagnosis not present

## 2019-10-19 DIAGNOSIS — E1142 Type 2 diabetes mellitus with diabetic polyneuropathy: Secondary | ICD-10-CM | POA: Diagnosis not present

## 2019-10-19 DIAGNOSIS — E039 Hypothyroidism, unspecified: Secondary | ICD-10-CM | POA: Diagnosis not present

## 2019-10-19 DIAGNOSIS — I10 Essential (primary) hypertension: Secondary | ICD-10-CM | POA: Diagnosis not present

## 2019-10-19 MED ORDER — CARVEDILOL 3.125 MG PO TABS: 3.1250 mg | ORAL_TABLET | Freq: Two times a day (BID) | ORAL | 0 refills | Status: DC

## 2019-10-30 ENCOUNTER — Other Ambulatory Visit: Payer: Self-pay | Admitting: *Deleted

## 2019-10-30 DIAGNOSIS — Z79899 Other long term (current) drug therapy: Secondary | ICD-10-CM

## 2019-10-30 NOTE — Progress Notes (Signed)
Lab Tech requested that a cbc be placed on this pts lab appt here at the office tomorrow 08/30/20.  Pt was to have a cbc and bmet done for xarelto. BMET was placed but CBC was left off.  This was ordered by Dr. Marlou Porch.  Order placed for pt.

## 2019-10-31 ENCOUNTER — Ambulatory Visit: Payer: Medicare HMO | Admitting: Cardiology

## 2019-10-31 ENCOUNTER — Other Ambulatory Visit: Payer: Medicare HMO

## 2019-10-31 ENCOUNTER — Encounter: Payer: Self-pay | Admitting: Cardiology

## 2019-10-31 ENCOUNTER — Other Ambulatory Visit: Payer: Self-pay

## 2019-10-31 VITALS — BP 112/70 | HR 68 | Ht 68.0 in | Wt 266.0 lb

## 2019-10-31 DIAGNOSIS — Z79899 Other long term (current) drug therapy: Secondary | ICD-10-CM

## 2019-10-31 DIAGNOSIS — E785 Hyperlipidemia, unspecified: Secondary | ICD-10-CM

## 2019-10-31 DIAGNOSIS — I4892 Unspecified atrial flutter: Secondary | ICD-10-CM | POA: Diagnosis not present

## 2019-10-31 DIAGNOSIS — I10 Essential (primary) hypertension: Secondary | ICD-10-CM | POA: Diagnosis not present

## 2019-10-31 NOTE — Patient Instructions (Signed)
Medication Instructions:  The current medical regimen is effective;  continue present plan and medications.  *If you need a refill on your cardiac medications before your next appointment, please call your pharmacy*  Lab Work: Please have blood work today (CBC, CMP, Lipid) If you have labs (blood work) drawn today and your tests are completely normal, you will receive your results only by: Marland Kitchen MyChart Message (if you have MyChart) OR . A paper copy in the mail If you have any lab test that is abnormal or we need to change your treatment, we will call you to review the results.  Follow-Up: At [facility], you and your health needs are our priority.  As part of our continuing mission to provide you with exceptional heart care, we have created designated Provider Care Teams.  These Care Teams include your primary Cardiologist (physician) and Advanced Practice Providers (APPs -  Physician Assistants and Nurse Practitioners) who all work together to provide you with the care you need, when you need it.  Your next appointment:   12 month(s)  The format for your next appointment:   In Person  Provider:   Candee Furbish, MD  Thank you for choosing [facility]!!

## 2019-10-31 NOTE — Progress Notes (Signed)
Cardiology Office Note:    Date:  10/31/2019   ID:  Linda Washington, DOB January 07, 1951, MRN [id number]  PCP:  Linda Stalker, PA-C  Cardiologist:  Candee Furbish, MD  Electrophysiologist:  None   Referring MD: Linda Stalker, PA-C     History of Present Illness:    Linda Washington is a 69 y.o. female here for follow-up of persistent atrial flutter.  This was seen following a knee surgery.  Was admitted at one point for acute diastolic heart failure found to be in atrial flutter and diuresed and underwent TEE cardioversion but reverted back to atrial flutter the next day.  Rate control.  Anticoagulation.  Has been doing well subsequently with furosemide.  OSA - needs Bibap  Atrial flutter ventricular rate 68 persistent.  No changes.  Denies any fevers chills nausea vomiting syncope bleeding        Past Medical History:  Diagnosis Date  . Acute on chronic diastolic (congestive) heart failure ([place]) 06/2018  . Anxiety    takes Citalopram daily  . Arthritis    generalized  . Basal cell carcinoma   . GERD (gastroesophageal reflux disease)    takes Omeprazole daily  . History of Bell's palsy    left side  . History of bronchitis   . History of hiatal hernia   . History of kidney stones   . Hyperlipemia    takes Simvastatin daily  . Hypertension    takes Enalapril daily  . Hypothyroidism   . Incontinence    takes Ditropan daily  . Joint swelling    left knee  . Neuropathy    takes Gabapentin daily  . PONV (postoperative nausea and vomiting)   . RLS (restless legs syndrome)   . Seasonal allergies    takes Claritin daioy as needed  . Sleep apnea    pt. states that she does not have a CPAP  . Tremor   . Type 2 diabetes mellitus (HCC)    takes Metformin and Amaryl daily    Past Surgical History:  Procedure Laterality Date  . ACHILLES TENDON REPAIR Right   . APPENDECTOMY    . BASAL CELL CARCINOMA EXCISION     Nose  . BREAST LUMPECTOMY Left   . CARDIAC  CATHETERIZATION     pt. states approximately 15 years ago  . CARDIOVERSION N/A 06/22/2018   Procedure: CARDIOVERSION;  Surgeon: Buford Dresser, MD;  Location: [facility] ENDOSCOPY;  Service: Cardiovascular;  Laterality: N/A;  . CARPAL TUNNEL RELEASE Bilateral   . DILATION AND CURETTAGE OF UTERUS    . EYE SURGERY Bilateral    cataract surgery  . KNEE ARTHROSCOPY     Right  . LEFT HEART CATH AND CORONARY ANGIOGRAPHY N/A 02/01/2017   Procedure: Left Heart Cath and Coronary Angiography;  Surgeon: Lorretta Harp, MD;  Location: [place] CV LAB;  Service: Cardiovascular;  Laterality: N/A;  . LITHOTRIPSY  2013  . TEE WITHOUT CARDIOVERSION N/A 06/22/2018   Procedure: TRANSESOPHAGEAL ECHOCARDIOGRAM (TEE);  Surgeon: Buford Dresser, MD;  Location: [facility] ENDOSCOPY;  Service: Cardiovascular;  Laterality: N/A;  . TOTAL ABDOMINAL HYSTERECTOMY    . TOTAL KNEE ARTHROPLASTY Left 04/08/2015   Procedure: LEFT TOTAL KNEE ARTHROPLASTY;  Surgeon: Frederik Pear, MD;  Location: [place];  Service: Orthopedics;  Laterality: Left;    Current Medications: Current Meds  Medication Sig  . atorvastatin (LIPITOR) 40 MG tablet Take 40 mg by mouth at bedtime.   . carvedilol (COREG) 3.125 MG tablet Take 1 tablet (  3.125 mg total) by mouth 2 (two) times daily with a meal. Please keep upcoming appt with Dr. Marlou Porch in January for future refills. Thank you  . cetirizine (ZYRTEC) 10 MG tablet Take 10 mg by mouth daily as needed (for allergies or allergic reactions).   . citalopram (CELEXA) 20 MG tablet Take 20 mg by mouth daily.  . enalapril (VASOTEC) 10 MG tablet Take 10 mg by mouth at bedtime.   . fluticasone (FLONASE) 50 MCG/ACT nasal spray Place 1-2 sprays into both nostrils daily as needed for allergies or rhinitis.  . furosemide (LASIX) 40 MG tablet Take 1 tablet (40 mg total) by mouth daily.  Marland Kitchen glimepiride (AMARYL) 2 MG tablet Take 2 mg by mouth daily before breakfast.   . HYDROcodone-acetaminophen (NORCO/VICODIN) 5-325  MG tablet Take 1 tablet by mouth every 6 (six) hours as needed for moderate pain.  . metFORMIN (GLUCOPHAGE) 1000 MG tablet Take 1,000 mg by mouth 2 (two) times daily with a meal.  . omeprazole (PRILOSEC) 20 MG capsule Take 20 mg by mouth daily.  Marland Kitchen oxybutynin (DITROPAN XL) 15 MG 24 hr tablet Take 15 mg by mouth daily.  Vladimir Faster Glycol-Propyl Glycol (SYSTANE OP) Place 1-2 drops into both eyes daily.   Alveda Reasons 20 MG TABS tablet TAKE 1 TABLET (20 MG TOTAL) BY MOUTH DAILY WITH SUPPER.     Allergies:   Hydrocodone, Methocarbamol, Oxycodone, and Latex   Social History   Socioeconomic History  . Marital status: Divorced    Spouse name: Not on file  . Number of children: Not on file  . Years of education: Some colle  . Highest education level: Not on file  Occupational History  . Occupation: part-time Research scientist (physical sciences) at [employer]  Tobacco Use  . Smoking status: Never Smoker  . Smokeless tobacco: Never Used  Substance and Sexual Activity  . Alcohol use: Yes    Alcohol/week: 0.0 standard drinks    Comment: Rare-wine  . Drug use: No  . Sexual activity: Not on file  Other Topics Concern  . Not on file  Social History Narrative   Lives at home with Linda Washington, roommate   Caffeine use: 1 Coffee day, 1-2 a day of tea, 1-2 sodas a day    Social Determinants of Health   Financial Resource Strain:   . Difficulty of Paying Living Expenses: Not on file  Food Insecurity:   . Worried About Charity fundraiser in the Last Year: Not on file  . Ran Out of Food in the Last Year: Not on file  Transportation Needs:   . Lack of Transportation (Medical): Not on file  . Lack of Transportation (Non-Medical): Not on file  Physical Activity:   . Days of Exercise per Week: Not on file  . Minutes of Exercise per Session: Not on file  Stress:   . Feeling of Stress : Not on file  Social Connections:   . Frequency of Communication with Friends and Family: Not on file  . Frequency of Social  Gatherings with Friends and Family: Not on file  . Attends Religious Services: Not on file  . Active Member of Clubs or Organizations: Not on file  . Attends Archivist Meetings: Not on file  . Marital Status: Not on file     Family History: The patient's family history includes CAD (age of onset: 64) in her brother; Heart disease in her father and mother; Heart disease (age of onset: 20) in her brother; Heart  failure in her father; Heart failure (age of onset: 25) in her mother.  ROS:   Please see the history of present illness.     EKGs/Labs/Other Studies Reviewed:    The following studies were reviewed today:  TEE Study Conclusions 06/2018  - Left ventricle: Systolic function was normal. The estimated ejection fraction was in the range of 50% to 55%. Wall motion was normal; there were no regional wall motion abnormalities. No evidence of thrombus. - Aortic valve: There was mild regurgitation originating from the commissure between the left coronary and noncoronary cusps. - Mitral valve: There was mild regurgitation directed centrally. - Left atrium: No evidence of thrombus in the atrial cavity or appendage. - Right atrium: No evidence of thrombus in the atrial cavity or appendage. - Atrial septum: A patent foramen ovale cannot be excluded. Trivial color flow noted at FO/intra-atrial septum. - Tricuspid valve: There was mild-moderate regurgitation. - Pulmonic valve: There was trivial regurgitation.  Impressions:  - Successful cardioversion. LAA and RA/RAA thoroughly interrogated, due to muscular LAA and IAS shadowing into RA/RAA. No cardiac source of emboli was indentified.   02/01/17 Left Heart Cath and Coronary Angiography  Conclusion    The left ventricular systolic function is normal.  LV end diastolic pressure is normal.  The left ventricular ejection fraction is 55-65% by visual estimate.  Ms. Elie has normal coronary  arteries and normal LV function. I believe her chest pain is noncardiac. The sheath was removed and a TR band was placed on the right wrist to achieve patent hemostasis. The patient left the lab in stable condition. She'll be discharged home later today with outpatient follow-up.      EKG: 10/31/2019 atrial flutter 68 no other abnormalities  Recent Labs: No results found for requested labs within last 8760 hours.  Recent Lipid Panel No results found for: CHOL, TRIG, HDL, CHOLHDL, VLDL, LDLCALC, LDLDIRECT  Physical Exam:    VS:  BP 112/70   Pulse 68   Ht _0  (1.727 m)   Wt 266 lb (120.7 kg)   BMI 40.45 kg/m     Wt Readings from Last 3 Encounters:  10/31/19 266 lb (120.7 kg)  10/26/18 273 lb (123.8 kg)  10/04/18 278 lb 12.8 oz (126.5 kg)     GEN:  Obese Well nourished, well developed in no acute distress HEENT: Normal NECK: No JVD; No carotid bruits LYMPHATICS: No lymphadenopathy CARDIAC: RRR, no murmurs, rubs, gallops RESPIRATORY:  Clear to auscultation without rales, wheezing or rhonchi  ABDOMEN: Soft, non-tender, non-distended MUSCULOSKELETAL:  No edema; No deformity  SKIN: Warm and dry NEUROLOGIC:  Alert and oriented x 3 PSYCHIATRIC:  Normal affect   ASSESSMENT:    1. Atrial flutter, unspecified type ([place])   2. Encounter for medication management   3. Essential hypertension   4. Dyslipidemia    PLAN:    In order of problems listed above:  Persistent atrial flutter - TEE cardioversion was only successful for a few hours.  Remember this was first discovered after knee surgery.  She is now managed with rate control.  Chads vas score 5.  Xarelto.  Overall doing quite well.  Last hemoglobin 13.1.  We will go ahead and repeat CBC.  Chronic diastolic heart failure -50 to 55% EF diuresed 7 L during her prior hospitalization.   Also needs obstructive sleep apnea treated.  Ambulates with a cane.  Short of breath with distance.  Okay for handicap placard.  Essential  hypertension -Currently well controlled.  No changes made.  Medications reviewed.  Hyperlipidemia -Stable.  No changes.  Continue with atorvastatin.  We will check lipid panel.  Check liver function as well.  Obstructive sleep apnea - Dr. Radford Pax.  Sleep study 08/22/2018- recommended BiPAP titration given suboptimal CPAP titration.  Morbid obesity - Good job with weight loss.  Continue.  Good job once again.   Medication Adjustments/Labs and Tests Ordered: Current medicines are reviewed at length with the patient today.  Concerns regarding medicines are outlined above.  Orders Placed This Encounter  Procedures  . Comp Met (CMET)  . Lipid Profile  . EKG 12-Lead   No orders of the defined types were placed in this encounter.   Patient Instructions  Medication Instructions:  The current medical regimen is effective;  continue present plan and medications.  *If you need a refill on your cardiac medications before your next appointment, please call your pharmacy*  Lab Work: Please have blood work today (CBC, CMP, Lipid) If you have labs (blood work) drawn today and your tests are completely normal, you will receive your results only by: Marland Kitchen MyChart Message (if you have MyChart) OR . A paper copy in the mail If you have any lab test that is abnormal or we need to change your treatment, we will call you to review the results.  Follow-Up: At [facility], you and your health needs are our priority.  As part of our continuing mission to provide you with exceptional heart care, we have created designated Provider Care Teams.  These Care Teams include your primary Cardiologist (physician) and Advanced Practice Providers (APPs -  Physician Assistants and Nurse Practitioners) who all work together to provide you with the care you need, when you need it.  Your next appointment:   12 month(s)  The format for your next appointment:   In Person  Provider:   Candee Furbish, MD  Thank you  for choosing [facility]!!        Signed, Candee Furbish, MD  10/31/2019 4:46 PM    [place]

## 2019-11-01 LAB — COMPREHENSIVE METABOLIC PANEL
ALT: 12 IU/L (ref 0–32)
AST: 12 IU/L (ref 0–40)
Albumin/Globulin Ratio: 1.4 (ref 1.2–2.2)
Albumin: 3.9 g/dL (ref 3.8–4.8)
Alkaline Phosphatase: 88 IU/L (ref 39–117)
BUN/Creatinine Ratio: 17 (ref 12–28)
BUN: 19 mg/dL (ref 8–27)
Bilirubin Total: 0.5 mg/dL (ref 0.0–1.2)
CO2: 26 mmol/L (ref 20–29)
Calcium: 9 mg/dL (ref 8.7–10.3)
Chloride: 101 mmol/L (ref 96–106)
Creatinine, Ser: 1.1 mg/dL — ABNORMAL HIGH (ref 0.57–1.00)
GFR calc Af Amer: 59 mL/min/{1.73_m2} — ABNORMAL LOW (ref >59)
GFR calc non Af Amer: 51 mL/min/{1.73_m2} — ABNORMAL LOW (ref >59)
Globulin, Total: 2.8 g/dL (ref 1.5–4.5)
Glucose: 82 mg/dL (ref 65–99)
Potassium: 4.2 mmol/L (ref 3.5–5.2)
Sodium: 141 mmol/L (ref 134–144)
Total Protein: 6.7 g/dL (ref 6.0–8.5)

## 2019-11-01 LAB — CBC
Hematocrit: 38 % (ref 34.0–46.6)
Hemoglobin: 12.4 g/dL (ref 11.1–15.9)
MCH: 28.7 pg (ref 26.6–33.0)
MCHC: 32.6 g/dL (ref 31.5–35.7)
MCV: 88 fL (ref 79–97)
Platelets: 363 10*3/uL (ref 150–450)
RBC: 4.32 x10E6/uL (ref 3.77–5.28)
RDW: 13.3 % (ref 11.7–15.4)
WBC: 9.2 10*3/uL (ref 3.4–10.8)

## 2019-11-01 LAB — LIPID PANEL
Chol/HDL Ratio: 2.2 ratio (ref 0.0–4.4)
Cholesterol, Total: 115 mg/dL (ref 100–199)
HDL: 52 mg/dL (ref >39)
LDL Chol Calc (NIH): 49 mg/dL (ref 0–99)
Triglycerides: 65 mg/dL (ref 0–149)
VLDL Cholesterol Cal: 14 mg/dL (ref 5–40)

## 2019-11-07 DIAGNOSIS — M1711 Unilateral primary osteoarthritis, right knee: Secondary | ICD-10-CM | POA: Diagnosis not present

## 2019-11-07 DIAGNOSIS — Z96652 Presence of left artificial knee joint: Secondary | ICD-10-CM | POA: Diagnosis not present

## 2019-11-12 ENCOUNTER — Other Ambulatory Visit: Payer: Self-pay | Admitting: Cardiology

## 2019-11-13 NOTE — Telephone Encounter (Signed)
Xarelto 20mg  refill request received. Pt is 69 years old, weight-120.7kg, Crea-1.10 on 10/31/2019, last seen by Dr. Marlou Porch on 10/31/2019, Diagnosis-Atrial flutter, CrCl-90.69ml/min; Dose is appropriate based on dosing criteria. Will send in refill to requested pharmacy.

## 2019-12-21 DIAGNOSIS — M1711 Unilateral primary osteoarthritis, right knee: Secondary | ICD-10-CM | POA: Diagnosis not present

## 2019-12-28 DIAGNOSIS — Z85828 Personal history of other malignant neoplasm of skin: Secondary | ICD-10-CM | POA: Diagnosis not present

## 2019-12-28 DIAGNOSIS — L814 Other melanin hyperpigmentation: Secondary | ICD-10-CM | POA: Diagnosis not present

## 2019-12-28 DIAGNOSIS — L720 Epidermal cyst: Secondary | ICD-10-CM | POA: Diagnosis not present

## 2019-12-28 DIAGNOSIS — D1801 Hemangioma of skin and subcutaneous tissue: Secondary | ICD-10-CM | POA: Diagnosis not present

## 2019-12-28 DIAGNOSIS — L821 Other seborrheic keratosis: Secondary | ICD-10-CM | POA: Diagnosis not present

## 2020-01-16 DIAGNOSIS — M47816 Spondylosis without myelopathy or radiculopathy, lumbar region: Secondary | ICD-10-CM | POA: Diagnosis not present

## 2020-01-16 DIAGNOSIS — E7800 Pure hypercholesterolemia, unspecified: Secondary | ICD-10-CM | POA: Diagnosis not present

## 2020-01-16 DIAGNOSIS — I509 Heart failure, unspecified: Secondary | ICD-10-CM | POA: Diagnosis not present

## 2020-01-16 DIAGNOSIS — I5030 Unspecified diastolic (congestive) heart failure: Secondary | ICD-10-CM | POA: Diagnosis not present

## 2020-01-16 DIAGNOSIS — E039 Hypothyroidism, unspecified: Secondary | ICD-10-CM | POA: Diagnosis not present

## 2020-01-16 DIAGNOSIS — I10 Essential (primary) hypertension: Secondary | ICD-10-CM | POA: Diagnosis not present

## 2020-01-16 DIAGNOSIS — E1142 Type 2 diabetes mellitus with diabetic polyneuropathy: Secondary | ICD-10-CM | POA: Diagnosis not present

## 2020-01-25 DIAGNOSIS — Z79899 Other long term (current) drug therapy: Secondary | ICD-10-CM | POA: Diagnosis not present

## 2020-01-25 DIAGNOSIS — E1142 Type 2 diabetes mellitus with diabetic polyneuropathy: Secondary | ICD-10-CM | POA: Diagnosis not present

## 2020-01-31 ENCOUNTER — Ambulatory Visit: Payer: Medicare HMO | Admitting: Podiatry

## 2020-02-04 ENCOUNTER — Other Ambulatory Visit: Payer: Self-pay | Admitting: Cardiology

## 2020-02-05 ENCOUNTER — Other Ambulatory Visit: Payer: Self-pay

## 2020-02-05 MED ORDER — ATORVASTATIN CALCIUM 40 MG PO TABS: 40.0000 mg | ORAL_TABLET | Freq: Every day | ORAL | 2 refills | Status: DC

## 2020-03-04 ENCOUNTER — Ambulatory Visit: Payer: Medicare HMO | Admitting: Registered"

## 2020-03-09 ENCOUNTER — Other Ambulatory Visit: Payer: Self-pay | Admitting: Cardiology

## 2020-03-26 DIAGNOSIS — M1711 Unilateral primary osteoarthritis, right knee: Secondary | ICD-10-CM | POA: Diagnosis not present

## 2020-03-26 DIAGNOSIS — M25551 Pain in right hip: Secondary | ICD-10-CM | POA: Diagnosis not present

## 2020-03-26 DIAGNOSIS — M25561 Pain in right knee: Secondary | ICD-10-CM | POA: Diagnosis not present

## 2020-03-27 ENCOUNTER — Ambulatory Visit: Payer: Medicare HMO | Admitting: Podiatry

## 2020-03-29 ENCOUNTER — Ambulatory Visit: Payer: Medicare HMO | Admitting: Orthotics

## 2020-04-04 ENCOUNTER — Telehealth: Payer: Self-pay | Admitting: Cardiology

## 2020-04-04 MED ORDER — WARFARIN SODIUM 5 MG PO TABS: 5.0000 mg | ORAL_TABLET | Freq: Every day | ORAL | 1 refills | Status: DC

## 2020-04-04 NOTE — Telephone Encounter (Signed)
Left message for pt regarding Dr Marlou Porch orders for starting warfarin in place of her Xarelto (since she took her last one last night) .  RX for 5 mg tablets to be sent into [facility] [facility] X [facility].  Advised to take 1 tablet every evening.  Scheduled new pt coumadin clinic appt and left message for pt to call the office if this is not a good day/time for her.

## 2020-04-04 NOTE — Telephone Encounter (Signed)
Pt will look for her medication formulary or call [employer] to determine which medications they will cover and at what co-pay.  Aware she may need to be changed to coumadin.  She will c/b once she has done this.

## 2020-04-04 NOTE — Telephone Encounter (Signed)
Spoke with pt Eliquis would be covered but $65 a month.  Pt would prefer to be changed to Warfarin were she would have no co-pay.  She is aware she will need to come into the office for INR checks-frequently at first.  Pt reports she took her last Xarelto last night!! And has no more.  Will contact Dr Marlou Porch for orders for Warfarin.

## 2020-04-04 NOTE — Telephone Encounter (Signed)
OK to switch to warfarin if needed. Candee Furbish, MD

## 2020-04-04 NOTE — Telephone Encounter (Signed)
Pt c/o medication issue:  1. Name of Medication: XARELTO 20 MG TABS tablet  2. How are you currently taking this medication (dosage and times per day)? As directed  3. Are you having a reaction (difficulty breathing--STAT)? no  4. What is your medication issue? Patient wants to know if there is another blood thinner she can be put on. She states that this medication is $400/month. Please advise.

## 2020-04-09 ENCOUNTER — Ambulatory Visit (INDEPENDENT_AMBULATORY_CARE_PROVIDER_SITE_OTHER): Payer: Medicare HMO | Admitting: *Deleted

## 2020-04-09 ENCOUNTER — Ambulatory Visit: Payer: Medicare HMO

## 2020-04-09 ENCOUNTER — Other Ambulatory Visit: Payer: Self-pay

## 2020-04-09 DIAGNOSIS — I4892 Unspecified atrial flutter: Secondary | ICD-10-CM | POA: Diagnosis not present

## 2020-04-09 DIAGNOSIS — Z5181 Encounter for therapeutic drug level monitoring: Secondary | ICD-10-CM | POA: Diagnosis not present

## 2020-04-09 LAB — POCT INR: INR: 1.1 — AB (ref 2.0–3.0)

## 2020-04-09 NOTE — Patient Instructions (Addendum)
A full discussion of the nature of anticoagulants has been carried out.  A benefit risk analysis has been presented to the patient, so that they understand the justification for choosing anticoagulation at this time. The need for frequent and regular monitoring, precise dosage adjustment and compliance is stressed.  Side effects of potential bleeding are discussed.  The patient should avoid any OTC items containing aspirin or ibuprofen, and should avoid great swings in general diet.  Avoid alcohol consumption.  Call if any signs of abnormal bleeding.   Description   Today take 1.5 tablets of Warfarin then continue taking 1 tablet daily except 1.5 tablets on Tuesdays and Saturdays. Recheck INR in 1 week. Coumadin Clinic [phone] Main [phone]

## 2020-04-10 ENCOUNTER — Encounter: Payer: Self-pay | Admitting: Skilled Nursing Facility1

## 2020-04-10 ENCOUNTER — Encounter: Payer: Medicare HMO | Attending: Internal Medicine | Admitting: Skilled Nursing Facility1

## 2020-04-10 DIAGNOSIS — E1165 Type 2 diabetes mellitus with hyperglycemia: Secondary | ICD-10-CM

## 2020-04-10 NOTE — Progress Notes (Signed)
  Assessment:  Primary concerns today: weight control.   Pt states she weighs herself daily due to heart failure stating it has been stable.  Pt states she is having trouble affording her medication due to being in the doughnut hole". Pt states she does not wear her C-PAP. Pts A1C 7.2. Checking 2 times a day: fasting 120-140 and 2 hours after eating 120-140. Pt states she has lost about 60 pounds over the last year stating some of that was fluid control. Pt states her main concern is weight loss. Pt states her weight is about 267 pounds. Pt states she is a stress eater. Pt states she goes to food when she is upset. Pt states she makes her own meals. Pt states she sleeps okay. 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 Pt states she takes a nap after lunch typically stating she wakes at 4:30am. Pt states she has an elliptical machine she has not put together. Pt states beef makes her sick. 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      MEDICATIONS: see list   DIETARY INTAKE:  Usual eating pattern includes 3 meals and 3+ snacks per day.  Everyday foods include none stated.  Avoided foods include none stated.    24-hr recall:  B ( AM): pimento cheese sandwich white wheat and coffee + alt sugar + half and half or fruit with yogurt and toast Snk ( AM): spice cake  L ( PM): fast food Snk ( PM):  D ( PM): fruit salad Snk ( PM): chips and desserts  Beverages: coffee, diet pepsi, water, wine, orange juice, unsweet tea, sweet tea  Usual physical activity: ADL's  Estimated energy needs: 1600 calories 180 g carbohydrates 120 g protein 44 g fat  Progress Towards Goal(s):  In progress.     Intervention:  Nutrition counseling. Dietitian educated pt on healthy diet within the context of weight loss and diabetes.   Goals: -Assemble your elliptical TODAY! -Create balanced meals aiming for non starchy vegetables 2 times a day 7 days a week -Log everything you put into your  mouth -Speak with your doctor about wearing a C-PAP or trying something else -Try kefir in the yogurt aisle; 4-8 ounces every morning    Teaching Method Utilized:  Visual Auditory Hands on  Handouts given during visit include:  Detailed MyPlate  Should I Eat sheet  Barriers to learning/adherence to lifestyle change: emotional eating  Demonstrated degree of understanding via:  Teach Back   Monitoring/Evaluation:  Dietary intake, exercise, and body weight prn.

## 2020-04-15 ENCOUNTER — Ambulatory Visit (INDEPENDENT_AMBULATORY_CARE_PROVIDER_SITE_OTHER): Payer: Medicare HMO

## 2020-04-15 ENCOUNTER — Other Ambulatory Visit: Payer: Self-pay

## 2020-04-15 DIAGNOSIS — I4892 Unspecified atrial flutter: Secondary | ICD-10-CM

## 2020-04-15 DIAGNOSIS — Z5181 Encounter for therapeutic drug level monitoring: Secondary | ICD-10-CM | POA: Diagnosis not present

## 2020-04-15 LAB — POCT INR: INR: 1.3 — AB (ref 2.0–3.0)

## 2020-04-15 NOTE — Telephone Encounter (Signed)
Pt was seen in Coumadin Clinic.  Next appt 6/28.

## 2020-04-15 NOTE — Patient Instructions (Signed)
Description   Start taking 1.5 tablets daily except 1 tablet on Fridays. Recheck INR in 1 week. Coumadin Clinic [phone] Main [phone]

## 2020-04-16 DIAGNOSIS — E119 Type 2 diabetes mellitus without complications: Secondary | ICD-10-CM | POA: Diagnosis not present

## 2020-04-16 DIAGNOSIS — H43813 Vitreous degeneration, bilateral: Secondary | ICD-10-CM | POA: Diagnosis not present

## 2020-04-16 DIAGNOSIS — H524 Presbyopia: Secondary | ICD-10-CM | POA: Diagnosis not present

## 2020-04-16 DIAGNOSIS — H26492 Other secondary cataract, left eye: Secondary | ICD-10-CM | POA: Diagnosis not present

## 2020-04-17 DIAGNOSIS — E039 Hypothyroidism, unspecified: Secondary | ICD-10-CM | POA: Diagnosis not present

## 2020-04-17 DIAGNOSIS — I5030 Unspecified diastolic (congestive) heart failure: Secondary | ICD-10-CM | POA: Diagnosis not present

## 2020-04-17 DIAGNOSIS — E1142 Type 2 diabetes mellitus with diabetic polyneuropathy: Secondary | ICD-10-CM | POA: Diagnosis not present

## 2020-04-17 DIAGNOSIS — I10 Essential (primary) hypertension: Secondary | ICD-10-CM | POA: Diagnosis not present

## 2020-04-17 DIAGNOSIS — I509 Heart failure, unspecified: Secondary | ICD-10-CM | POA: Diagnosis not present

## 2020-04-17 DIAGNOSIS — M47816 Spondylosis without myelopathy or radiculopathy, lumbar region: Secondary | ICD-10-CM | POA: Diagnosis not present

## 2020-04-17 DIAGNOSIS — E7800 Pure hypercholesterolemia, unspecified: Secondary | ICD-10-CM | POA: Diagnosis not present

## 2020-04-23 ENCOUNTER — Other Ambulatory Visit: Payer: Self-pay

## 2020-04-23 ENCOUNTER — Ambulatory Visit: Payer: Medicare HMO

## 2020-04-23 DIAGNOSIS — Z5181 Encounter for therapeutic drug level monitoring: Secondary | ICD-10-CM

## 2020-04-23 DIAGNOSIS — I4892 Unspecified atrial flutter: Secondary | ICD-10-CM

## 2020-04-23 LAB — POCT INR: INR: 1.6 — AB (ref 2.0–3.0)

## 2020-04-23 MED ORDER — WARFARIN SODIUM 5 MG PO TABS: 5.0000 mg | ORAL_TABLET | Freq: Every day | ORAL | 1 refills | Status: DC

## 2020-04-23 NOTE — Patient Instructions (Signed)
Start taking 1.5 tablets daily except take 2 tablets on Tuesdays and Saturdays. Recheck INR in 1 week.  [phone] Main [phone]

## 2020-04-28 ENCOUNTER — Other Ambulatory Visit: Payer: Self-pay | Admitting: Cardiology

## 2020-04-30 ENCOUNTER — Other Ambulatory Visit: Payer: Self-pay

## 2020-04-30 ENCOUNTER — Ambulatory Visit (INDEPENDENT_AMBULATORY_CARE_PROVIDER_SITE_OTHER): Payer: Medicare HMO | Admitting: *Deleted

## 2020-04-30 DIAGNOSIS — Z5181 Encounter for therapeutic drug level monitoring: Secondary | ICD-10-CM | POA: Diagnosis not present

## 2020-04-30 DIAGNOSIS — I4892 Unspecified atrial flutter: Secondary | ICD-10-CM | POA: Diagnosis not present

## 2020-04-30 LAB — POCT INR: INR: 1.8 — AB (ref 2.0–3.0)

## 2020-04-30 NOTE — Patient Instructions (Signed)
Description   Today take 2.5 tablets then start taking 1.5 tablets daily except take 2 tablets on Tuesdays, Thursdays, and Saturdays. Recheck INR in 1 week.  [phone] Main [phone]

## 2020-05-03 DIAGNOSIS — E039 Hypothyroidism, unspecified: Secondary | ICD-10-CM | POA: Diagnosis not present

## 2020-05-03 DIAGNOSIS — E1142 Type 2 diabetes mellitus with diabetic polyneuropathy: Secondary | ICD-10-CM | POA: Diagnosis not present

## 2020-05-03 DIAGNOSIS — M47816 Spondylosis without myelopathy or radiculopathy, lumbar region: Secondary | ICD-10-CM | POA: Diagnosis not present

## 2020-05-03 DIAGNOSIS — I509 Heart failure, unspecified: Secondary | ICD-10-CM | POA: Diagnosis not present

## 2020-05-03 DIAGNOSIS — E7800 Pure hypercholesterolemia, unspecified: Secondary | ICD-10-CM | POA: Diagnosis not present

## 2020-05-03 DIAGNOSIS — I10 Essential (primary) hypertension: Secondary | ICD-10-CM | POA: Diagnosis not present

## 2020-05-03 DIAGNOSIS — I5030 Unspecified diastolic (congestive) heart failure: Secondary | ICD-10-CM | POA: Diagnosis not present

## 2020-05-07 ENCOUNTER — Other Ambulatory Visit: Payer: Self-pay

## 2020-05-07 ENCOUNTER — Ambulatory Visit: Payer: Medicare HMO | Admitting: *Deleted

## 2020-05-07 DIAGNOSIS — I4892 Unspecified atrial flutter: Secondary | ICD-10-CM

## 2020-05-07 DIAGNOSIS — Z5181 Encounter for therapeutic drug level monitoring: Secondary | ICD-10-CM | POA: Diagnosis not present

## 2020-05-07 LAB — POCT INR: INR: 2.4 (ref 2.0–3.0)

## 2020-05-07 MED ORDER — WARFARIN SODIUM 5 MG PO TABS: ORAL_TABLET | ORAL | 1 refills | Status: DC

## 2020-05-07 NOTE — Patient Instructions (Signed)
Description   Continue taking 1.5 tablets daily except take 2 tablets on Tuesdays, Thursdays, and Saturdays. Recheck INR in 1 week.  [phone] Main [phone]

## 2020-05-14 ENCOUNTER — Ambulatory Visit (INDEPENDENT_AMBULATORY_CARE_PROVIDER_SITE_OTHER): Payer: Medicare HMO

## 2020-05-14 ENCOUNTER — Other Ambulatory Visit: Payer: Self-pay

## 2020-05-14 DIAGNOSIS — I4892 Unspecified atrial flutter: Secondary | ICD-10-CM | POA: Diagnosis not present

## 2020-05-14 DIAGNOSIS — Z5181 Encounter for therapeutic drug level monitoring: Secondary | ICD-10-CM | POA: Diagnosis not present

## 2020-05-14 LAB — POCT INR: INR: 3.3 — AB (ref 2.0–3.0)

## 2020-05-14 NOTE — Patient Instructions (Signed)
Description   Take 1 tablet today, then start taking 1.5 tablets daily except take 2 tablets on Tuesdays and Saturdays. Recheck INR in 10 days.  [phone] Main [phone]

## 2020-05-23 ENCOUNTER — Encounter: Payer: Medicare HMO | Attending: Internal Medicine | Admitting: Skilled Nursing Facility1

## 2020-05-23 DIAGNOSIS — E1165 Type 2 diabetes mellitus with hyperglycemia: Secondary | ICD-10-CM | POA: Insufficient documentation

## 2020-05-30 ENCOUNTER — Ambulatory Visit (INDEPENDENT_AMBULATORY_CARE_PROVIDER_SITE_OTHER): Payer: Medicare HMO | Admitting: *Deleted

## 2020-05-30 ENCOUNTER — Other Ambulatory Visit: Payer: Self-pay

## 2020-05-30 DIAGNOSIS — Z5181 Encounter for therapeutic drug level monitoring: Secondary | ICD-10-CM | POA: Diagnosis not present

## 2020-05-30 DIAGNOSIS — I4892 Unspecified atrial flutter: Secondary | ICD-10-CM

## 2020-05-30 LAB — POCT INR: INR: 3.3 — AB (ref 2.0–3.0)

## 2020-05-30 NOTE — Patient Instructions (Signed)
Description   Hold today's dose then start taking 1.5 tablets daily except take 2 tablets on Saturdays. Recheck INR in 2 weeks.  [phone] Main [phone]

## 2020-06-03 ENCOUNTER — Other Ambulatory Visit: Payer: Self-pay | Admitting: Cardiology

## 2020-06-06 DIAGNOSIS — H26492 Other secondary cataract, left eye: Secondary | ICD-10-CM | POA: Diagnosis not present

## 2020-06-11 ENCOUNTER — Ambulatory Visit (INDEPENDENT_AMBULATORY_CARE_PROVIDER_SITE_OTHER): Payer: Medicare HMO | Admitting: Podiatry

## 2020-06-11 ENCOUNTER — Other Ambulatory Visit: Payer: Self-pay

## 2020-06-11 ENCOUNTER — Encounter: Payer: Self-pay | Admitting: Podiatry

## 2020-06-11 DIAGNOSIS — M79675 Pain in left toe(s): Secondary | ICD-10-CM | POA: Diagnosis not present

## 2020-06-11 DIAGNOSIS — B351 Tinea unguium: Secondary | ICD-10-CM | POA: Diagnosis not present

## 2020-06-11 DIAGNOSIS — M2010 Hallux valgus (acquired), unspecified foot: Secondary | ICD-10-CM

## 2020-06-11 DIAGNOSIS — E119 Type 2 diabetes mellitus without complications: Secondary | ICD-10-CM | POA: Diagnosis not present

## 2020-06-11 DIAGNOSIS — M2040 Other hammer toe(s) (acquired), unspecified foot: Secondary | ICD-10-CM

## 2020-06-11 MED ORDER — WARFARIN SODIUM 5 MG PO TABS: ORAL_TABLET | ORAL | 1 refills | Status: DC

## 2020-06-11 MED ORDER — FUROSEMIDE 40 MG PO TABS: 40.0000 mg | ORAL_TABLET | Freq: Every day | ORAL | 0 refills | Status: DC

## 2020-06-11 NOTE — Telephone Encounter (Signed)
[facility] mail order pharmacy requesting a refill on warfarin. Please address

## 2020-06-11 NOTE — Progress Notes (Signed)
This patient returns to my office for at risk foot care.  This patient requires this care by a professional since this patient will be at risk due to having diabetes mellitus.and coagulation defect.   This patient is unable to cut nails herself since the patient cannot reach her nails.These nails are painful walking and wearing shoes.  This patient presents for at risk foot care today.  General Appearance  Alert, conversant and in no acute stress.  Vascular  Dorsalis pedis and posterior tibial  pulses are weakly  palpable  bilaterally.  Capillary return is within normal limits  bilaterally. Temperature is within normal limits  bilaterally.  Neurologic  Senn-Weinstein monofilament wire test diminished   bilaterally. Muscle power within normal limits bilaterally.  Nails Thick disfigured discolored nails with subungual debris  Hallux nails. No evidence of bacterial infection or drainage bilaterally.  Orthopedic  No limitations of motion  feet .  No crepitus or effusions noted.  HAV  B/L.  Hammer toes  B/L.  Haglunds deformity right foot.    Skin  normotropic skin with no porokeratosis noted bilaterally.  No signs of infections or ulcers noted.     Onychomycosis  Pain in right toes  Pain in left toes  Consent was obtained for treatment procedures.   Mechanical debridement of nails 1-5  bilaterally performed with a nail nipper.  Filed with dremel without incident. Patient requests diabetic shoes but we will obtain the shoes at a later date. Diabetic exam reveals diminished pulses and LOPS  B/L   Return office visit    3 months                  Told patient to return for periodic foot care and evaluation due to potential at risk complications.   Gardiner Barefoot DPM

## 2020-06-14 ENCOUNTER — Ambulatory Visit (INDEPENDENT_AMBULATORY_CARE_PROVIDER_SITE_OTHER): Payer: Medicare HMO | Admitting: Pharmacist

## 2020-06-14 ENCOUNTER — Other Ambulatory Visit: Payer: Self-pay

## 2020-06-14 DIAGNOSIS — Z5181 Encounter for therapeutic drug level monitoring: Secondary | ICD-10-CM | POA: Diagnosis not present

## 2020-06-14 DIAGNOSIS — I4892 Unspecified atrial flutter: Secondary | ICD-10-CM | POA: Diagnosis not present

## 2020-06-14 LAB — POCT INR: INR: 2 (ref 2.0–3.0)

## 2020-06-14 NOTE — Patient Instructions (Signed)
Continue taking 1.5 tablets daily except take 2 tablets on Saturdays. Recheck INR in 3 weeks.  [phone] Main [phone]

## 2020-06-25 ENCOUNTER — Telehealth: Payer: Self-pay | Admitting: Podiatry

## 2020-06-25 DIAGNOSIS — K219 Gastro-esophageal reflux disease without esophagitis: Secondary | ICD-10-CM | POA: Diagnosis not present

## 2020-06-25 DIAGNOSIS — E7800 Pure hypercholesterolemia, unspecified: Secondary | ICD-10-CM | POA: Diagnosis not present

## 2020-06-25 DIAGNOSIS — I5030 Unspecified diastolic (congestive) heart failure: Secondary | ICD-10-CM | POA: Diagnosis not present

## 2020-06-25 DIAGNOSIS — I10 Essential (primary) hypertension: Secondary | ICD-10-CM | POA: Diagnosis not present

## 2020-06-25 DIAGNOSIS — I509 Heart failure, unspecified: Secondary | ICD-10-CM | POA: Diagnosis not present

## 2020-06-25 DIAGNOSIS — M47816 Spondylosis without myelopathy or radiculopathy, lumbar region: Secondary | ICD-10-CM | POA: Diagnosis not present

## 2020-06-25 DIAGNOSIS — E039 Hypothyroidism, unspecified: Secondary | ICD-10-CM | POA: Diagnosis not present

## 2020-06-25 DIAGNOSIS — E1142 Type 2 diabetes mellitus with diabetic polyneuropathy: Secondary | ICD-10-CM | POA: Diagnosis not present

## 2020-06-25 NOTE — Telephone Encounter (Signed)
Pt left message stating she wanted an appt for diabetic shoes like she gets every year.  I returned call and left message our next available appt for diabetic shoes measurements would be 9.24 or later and to call to schedule an appt.

## 2020-06-26 ENCOUNTER — Other Ambulatory Visit: Payer: Self-pay

## 2020-06-26 ENCOUNTER — Encounter: Payer: Medicare HMO | Attending: Internal Medicine | Admitting: Skilled Nursing Facility1

## 2020-06-26 DIAGNOSIS — E1165 Type 2 diabetes mellitus with hyperglycemia: Secondary | ICD-10-CM | POA: Insufficient documentation

## 2020-06-26 MED ORDER — ATORVASTATIN CALCIUM 40 MG PO TABS: 40.0000 mg | ORAL_TABLET | Freq: Every day | ORAL | 1 refills | Status: DC

## 2020-06-26 MED ORDER — CARVEDILOL 3.125 MG PO TABS: ORAL_TABLET | ORAL | 1 refills | Status: DC

## 2020-06-26 NOTE — Progress Notes (Signed)
  Assessment:  Primary concerns today: weight control.    Pt states she has had a hard last few months. Pt states she twisted her back at work so is on light duty so she has not been able to use her elliptical. Pt states she did set her elliptical up. Pt states her doctor told her she could not eat anything green.  Pt states her blood sugars has been 103. Pt states she eats when she feels lonely.    MEDICATIONS: see list   DIETARY INTAKE:  Usual eating pattern includes 3 meals and 3+ snacks per day.  Everyday foods include none stated.  Avoided foods include none stated.    24-hr recall:  B ( AM): pimento cheese sandwich white wheat and coffee + alt sugar + half and half or fruit with yogurt and toast Snk ( AM): spice cake  L ( PM): fast food Snk ( PM):  D ( PM): fruit salad Snk ( PM): chips and desserts  Beverages: coffee, diet pepsi, water, wine, orange juice, unsweet tea, sweet tea  Usual physical activity: ADL's  Estimated energy needs: 1600 calories 180 g carbohydrates 120 g protein 44 g fat  Progress Towards Goal(s):  In progress.     Intervention:  Nutrition counseling. Dietitian educated pt on healthy diet within the context of weight loss and diabetes.   Goals:  Work on occupying your loneliness/bordum with activities rather than food  Teaching Method Utilized:  Ship broker Hands on  Handouts given during visit include:  Detailed MyPlate  Should I Eat sheet  Barriers to learning/adherence to lifestyle change: emotional eating  Demonstrated degree of understanding via:  Teach Back   Monitoring/Evaluation:  Dietary intake, exercise, and body weight prn.

## 2020-07-03 DIAGNOSIS — H8110 Benign paroxysmal vertigo, unspecified ear: Secondary | ICD-10-CM | POA: Diagnosis not present

## 2020-07-03 DIAGNOSIS — Z23 Encounter for immunization: Secondary | ICD-10-CM | POA: Diagnosis not present

## 2020-07-03 DIAGNOSIS — R2681 Unsteadiness on feet: Secondary | ICD-10-CM | POA: Diagnosis not present

## 2020-07-04 DIAGNOSIS — M545 Low back pain: Secondary | ICD-10-CM | POA: Diagnosis not present

## 2020-07-04 DIAGNOSIS — M1990 Unspecified osteoarthritis, unspecified site: Secondary | ICD-10-CM | POA: Diagnosis not present

## 2020-07-04 DIAGNOSIS — I10 Essential (primary) hypertension: Secondary | ICD-10-CM | POA: Diagnosis not present

## 2020-07-04 DIAGNOSIS — M2569 Stiffness of other specified joint, not elsewhere classified: Secondary | ICD-10-CM | POA: Diagnosis not present

## 2020-07-04 DIAGNOSIS — E6609 Other obesity due to excess calories: Secondary | ICD-10-CM | POA: Diagnosis not present

## 2020-07-04 DIAGNOSIS — M799 Soft tissue disorder, unspecified: Secondary | ICD-10-CM | POA: Diagnosis not present

## 2020-07-04 DIAGNOSIS — R42 Dizziness and giddiness: Secondary | ICD-10-CM | POA: Diagnosis not present

## 2020-07-04 DIAGNOSIS — E08610 Diabetes mellitus due to underlying condition with diabetic neuropathic arthropathy: Secondary | ICD-10-CM | POA: Diagnosis not present

## 2020-07-05 DIAGNOSIS — E6609 Other obesity due to excess calories: Secondary | ICD-10-CM | POA: Diagnosis not present

## 2020-07-05 DIAGNOSIS — M545 Low back pain: Secondary | ICD-10-CM | POA: Diagnosis not present

## 2020-07-05 DIAGNOSIS — R42 Dizziness and giddiness: Secondary | ICD-10-CM | POA: Diagnosis not present

## 2020-07-05 DIAGNOSIS — M2569 Stiffness of other specified joint, not elsewhere classified: Secondary | ICD-10-CM | POA: Diagnosis not present

## 2020-07-05 DIAGNOSIS — M799 Soft tissue disorder, unspecified: Secondary | ICD-10-CM | POA: Diagnosis not present

## 2020-07-05 DIAGNOSIS — I10 Essential (primary) hypertension: Secondary | ICD-10-CM | POA: Diagnosis not present

## 2020-07-05 DIAGNOSIS — E08610 Diabetes mellitus due to underlying condition with diabetic neuropathic arthropathy: Secondary | ICD-10-CM | POA: Diagnosis not present

## 2020-07-05 DIAGNOSIS — M1990 Unspecified osteoarthritis, unspecified site: Secondary | ICD-10-CM | POA: Diagnosis not present

## 2020-07-06 ENCOUNTER — Other Ambulatory Visit: Payer: Self-pay | Admitting: Cardiology

## 2020-07-09 ENCOUNTER — Telehealth: Payer: Self-pay | Admitting: *Deleted

## 2020-07-09 DIAGNOSIS — M1990 Unspecified osteoarthritis, unspecified site: Secondary | ICD-10-CM | POA: Diagnosis not present

## 2020-07-09 DIAGNOSIS — E08610 Diabetes mellitus due to underlying condition with diabetic neuropathic arthropathy: Secondary | ICD-10-CM | POA: Diagnosis not present

## 2020-07-09 DIAGNOSIS — E6609 Other obesity due to excess calories: Secondary | ICD-10-CM | POA: Diagnosis not present

## 2020-07-09 DIAGNOSIS — M2569 Stiffness of other specified joint, not elsewhere classified: Secondary | ICD-10-CM | POA: Diagnosis not present

## 2020-07-09 DIAGNOSIS — I10 Essential (primary) hypertension: Secondary | ICD-10-CM | POA: Diagnosis not present

## 2020-07-09 DIAGNOSIS — R42 Dizziness and giddiness: Secondary | ICD-10-CM | POA: Diagnosis not present

## 2020-07-09 DIAGNOSIS — M545 Low back pain: Secondary | ICD-10-CM | POA: Diagnosis not present

## 2020-07-09 DIAGNOSIS — M799 Soft tissue disorder, unspecified: Secondary | ICD-10-CM | POA: Diagnosis not present

## 2020-07-09 NOTE — Telephone Encounter (Signed)
Pt missed today's (9/21) Anticoagulation Appt, called to r/s but no answer & VM is full. Will try back at a later date.

## 2020-07-09 NOTE — Telephone Encounter (Signed)
Pt missed today's Anticoagulation appt, called pt to get rescheduled and there was no answer and her mailbox is full. Will try back on a later date.

## 2020-07-11 ENCOUNTER — Other Ambulatory Visit: Payer: Self-pay

## 2020-07-11 ENCOUNTER — Ambulatory Visit (INDEPENDENT_AMBULATORY_CARE_PROVIDER_SITE_OTHER): Payer: Medicare HMO | Admitting: *Deleted

## 2020-07-11 DIAGNOSIS — I4892 Unspecified atrial flutter: Secondary | ICD-10-CM

## 2020-07-11 DIAGNOSIS — Z5181 Encounter for therapeutic drug level monitoring: Secondary | ICD-10-CM | POA: Diagnosis not present

## 2020-07-11 LAB — POCT INR: INR: 2.2 (ref 2.0–3.0)

## 2020-07-11 NOTE — Patient Instructions (Signed)
Description   Continue taking Warfarin 1.5 tablets daily except take 2 tablets on Saturdays. Recheck INR in 4 weeks.  [phone] Main [phone]

## 2020-07-12 ENCOUNTER — Telehealth: Payer: Self-pay | Admitting: Podiatry

## 2020-07-12 NOTE — Telephone Encounter (Signed)
Pt left message asking for a text back with her appt time for next Tuesday.  I called pt and left message that I am not able to test but her appt is next Tuesday 9.28 @ 345.

## 2020-07-16 ENCOUNTER — Ambulatory Visit: Payer: Medicare HMO | Admitting: Orthotics

## 2020-07-16 ENCOUNTER — Other Ambulatory Visit: Payer: Self-pay

## 2020-07-16 DIAGNOSIS — M2040 Other hammer toe(s) (acquired), unspecified foot: Secondary | ICD-10-CM

## 2020-07-16 DIAGNOSIS — M79675 Pain in left toe(s): Secondary | ICD-10-CM

## 2020-07-16 DIAGNOSIS — E119 Type 2 diabetes mellitus without complications: Secondary | ICD-10-CM

## 2020-07-16 DIAGNOSIS — B351 Tinea unguium: Secondary | ICD-10-CM

## 2020-07-16 DIAGNOSIS — M2010 Hallux valgus (acquired), unspecified foot: Secondary | ICD-10-CM

## 2020-07-16 NOTE — Progress Notes (Signed)

## 2020-07-26 DIAGNOSIS — E1165 Type 2 diabetes mellitus with hyperglycemia: Secondary | ICD-10-CM | POA: Diagnosis not present

## 2020-07-26 DIAGNOSIS — Z7984 Long term (current) use of oral hypoglycemic drugs: Secondary | ICD-10-CM | POA: Diagnosis not present

## 2020-07-26 DIAGNOSIS — E1142 Type 2 diabetes mellitus with diabetic polyneuropathy: Secondary | ICD-10-CM | POA: Diagnosis not present

## 2020-07-31 DIAGNOSIS — E039 Hypothyroidism, unspecified: Secondary | ICD-10-CM | POA: Diagnosis not present

## 2020-07-31 DIAGNOSIS — M2569 Stiffness of other specified joint, not elsewhere classified: Secondary | ICD-10-CM | POA: Diagnosis not present

## 2020-07-31 DIAGNOSIS — E6609 Other obesity due to excess calories: Secondary | ICD-10-CM | POA: Diagnosis not present

## 2020-07-31 DIAGNOSIS — I10 Essential (primary) hypertension: Secondary | ICD-10-CM | POA: Diagnosis not present

## 2020-07-31 DIAGNOSIS — E1142 Type 2 diabetes mellitus with diabetic polyneuropathy: Secondary | ICD-10-CM | POA: Diagnosis not present

## 2020-07-31 DIAGNOSIS — I5030 Unspecified diastolic (congestive) heart failure: Secondary | ICD-10-CM | POA: Diagnosis not present

## 2020-07-31 DIAGNOSIS — E7800 Pure hypercholesterolemia, unspecified: Secondary | ICD-10-CM | POA: Diagnosis not present

## 2020-07-31 DIAGNOSIS — E08610 Diabetes mellitus due to underlying condition with diabetic neuropathic arthropathy: Secondary | ICD-10-CM | POA: Diagnosis not present

## 2020-07-31 DIAGNOSIS — M1990 Unspecified osteoarthritis, unspecified site: Secondary | ICD-10-CM | POA: Diagnosis not present

## 2020-07-31 DIAGNOSIS — M799 Soft tissue disorder, unspecified: Secondary | ICD-10-CM | POA: Diagnosis not present

## 2020-07-31 DIAGNOSIS — K219 Gastro-esophageal reflux disease without esophagitis: Secondary | ICD-10-CM | POA: Diagnosis not present

## 2020-07-31 DIAGNOSIS — M47816 Spondylosis without myelopathy or radiculopathy, lumbar region: Secondary | ICD-10-CM | POA: Diagnosis not present

## 2020-07-31 DIAGNOSIS — M545 Low back pain, unspecified: Secondary | ICD-10-CM | POA: Diagnosis not present

## 2020-07-31 DIAGNOSIS — R42 Dizziness and giddiness: Secondary | ICD-10-CM | POA: Diagnosis not present

## 2020-07-31 DIAGNOSIS — I509 Heart failure, unspecified: Secondary | ICD-10-CM | POA: Diagnosis not present

## 2020-08-08 ENCOUNTER — Ambulatory Visit (INDEPENDENT_AMBULATORY_CARE_PROVIDER_SITE_OTHER): Payer: Medicare HMO | Admitting: *Deleted

## 2020-08-08 ENCOUNTER — Other Ambulatory Visit: Payer: Self-pay

## 2020-08-08 DIAGNOSIS — I10 Essential (primary) hypertension: Secondary | ICD-10-CM | POA: Diagnosis not present

## 2020-08-08 DIAGNOSIS — Z5181 Encounter for therapeutic drug level monitoring: Secondary | ICD-10-CM

## 2020-08-08 DIAGNOSIS — M545 Low back pain, unspecified: Secondary | ICD-10-CM | POA: Diagnosis not present

## 2020-08-08 DIAGNOSIS — I4892 Unspecified atrial flutter: Secondary | ICD-10-CM

## 2020-08-08 DIAGNOSIS — E6609 Other obesity due to excess calories: Secondary | ICD-10-CM | POA: Diagnosis not present

## 2020-08-08 DIAGNOSIS — M2569 Stiffness of other specified joint, not elsewhere classified: Secondary | ICD-10-CM | POA: Diagnosis not present

## 2020-08-08 DIAGNOSIS — M1990 Unspecified osteoarthritis, unspecified site: Secondary | ICD-10-CM | POA: Diagnosis not present

## 2020-08-08 DIAGNOSIS — E08610 Diabetes mellitus due to underlying condition with diabetic neuropathic arthropathy: Secondary | ICD-10-CM | POA: Diagnosis not present

## 2020-08-08 DIAGNOSIS — R42 Dizziness and giddiness: Secondary | ICD-10-CM | POA: Diagnosis not present

## 2020-08-08 DIAGNOSIS — M799 Soft tissue disorder, unspecified: Secondary | ICD-10-CM | POA: Diagnosis not present

## 2020-08-08 LAB — POCT INR: INR: 1.9 — AB (ref 2.0–3.0)

## 2020-08-08 NOTE — Patient Instructions (Signed)
Description    Take 2 tablets today, then Continue taking Warfarin 1.5 tablets daily except take 2 tablets on Saturdays. Recheck INR in 3  weeks.  [phone] Main [phone]

## 2020-08-21 ENCOUNTER — Telehealth: Payer: Self-pay | Admitting: Orthotics

## 2020-08-21 DIAGNOSIS — G8929 Other chronic pain: Secondary | ICD-10-CM | POA: Diagnosis not present

## 2020-08-21 DIAGNOSIS — E119 Type 2 diabetes mellitus without complications: Secondary | ICD-10-CM

## 2020-08-21 DIAGNOSIS — F418 Other specified anxiety disorders: Secondary | ICD-10-CM | POA: Diagnosis not present

## 2020-08-21 DIAGNOSIS — N3281 Overactive bladder: Secondary | ICD-10-CM | POA: Diagnosis not present

## 2020-08-21 DIAGNOSIS — E1142 Type 2 diabetes mellitus with diabetic polyneuropathy: Secondary | ICD-10-CM | POA: Diagnosis not present

## 2020-08-21 DIAGNOSIS — I10 Essential (primary) hypertension: Secondary | ICD-10-CM | POA: Diagnosis not present

## 2020-08-21 DIAGNOSIS — M545 Low back pain, unspecified: Secondary | ICD-10-CM | POA: Diagnosis not present

## 2020-08-21 DIAGNOSIS — E7800 Pure hypercholesterolemia, unspecified: Secondary | ICD-10-CM | POA: Diagnosis not present

## 2020-08-21 NOTE — Telephone Encounter (Signed)
Returned call to advise status of shoes;  Dr Buddy Duty idicated no visit since 01/25/20.  That would put outside Medicare guidelines for signing docs (3 months)

## 2020-08-23 NOTE — Telephone Encounter (Signed)
Returned phone call she had left on Dawn's message 11/4.  Left message

## 2020-08-27 DIAGNOSIS — M1711 Unilateral primary osteoarthritis, right knee: Secondary | ICD-10-CM | POA: Diagnosis not present

## 2020-08-29 ENCOUNTER — Other Ambulatory Visit: Payer: Self-pay

## 2020-08-29 ENCOUNTER — Ambulatory Visit (INDEPENDENT_AMBULATORY_CARE_PROVIDER_SITE_OTHER): Payer: Medicare HMO | Admitting: Pharmacist

## 2020-08-29 DIAGNOSIS — Z5181 Encounter for therapeutic drug level monitoring: Secondary | ICD-10-CM | POA: Diagnosis not present

## 2020-08-29 DIAGNOSIS — I4892 Unspecified atrial flutter: Secondary | ICD-10-CM

## 2020-08-29 LAB — POCT INR: INR: 2.1 (ref 2.0–3.0)

## 2020-08-29 NOTE — Patient Instructions (Signed)
Continue taking Warfarin 1.5 tablets daily except take 2 tablets on Saturdays. Recheck INR in 4 weeks.  [phone] Main [phone]

## 2020-09-16 DIAGNOSIS — G8929 Other chronic pain: Secondary | ICD-10-CM | POA: Diagnosis not present

## 2020-09-16 DIAGNOSIS — K219 Gastro-esophageal reflux disease without esophagitis: Secondary | ICD-10-CM | POA: Diagnosis not present

## 2020-09-16 DIAGNOSIS — I10 Essential (primary) hypertension: Secondary | ICD-10-CM | POA: Diagnosis not present

## 2020-09-16 DIAGNOSIS — E039 Hypothyroidism, unspecified: Secondary | ICD-10-CM | POA: Diagnosis not present

## 2020-09-16 DIAGNOSIS — M47816 Spondylosis without myelopathy or radiculopathy, lumbar region: Secondary | ICD-10-CM | POA: Diagnosis not present

## 2020-09-16 DIAGNOSIS — I5030 Unspecified diastolic (congestive) heart failure: Secondary | ICD-10-CM | POA: Diagnosis not present

## 2020-09-16 DIAGNOSIS — E7800 Pure hypercholesterolemia, unspecified: Secondary | ICD-10-CM | POA: Diagnosis not present

## 2020-09-16 DIAGNOSIS — E1142 Type 2 diabetes mellitus with diabetic polyneuropathy: Secondary | ICD-10-CM | POA: Diagnosis not present

## 2020-09-16 DIAGNOSIS — I509 Heart failure, unspecified: Secondary | ICD-10-CM | POA: Diagnosis not present

## 2020-09-17 ENCOUNTER — Ambulatory Visit: Payer: Medicare HMO | Admitting: Orthotics

## 2020-09-17 ENCOUNTER — Ambulatory Visit: Payer: Medicare HMO | Admitting: Podiatry

## 2020-09-17 ENCOUNTER — Encounter: Payer: Self-pay | Admitting: Podiatry

## 2020-09-17 ENCOUNTER — Other Ambulatory Visit: Payer: Self-pay

## 2020-09-17 DIAGNOSIS — E119 Type 2 diabetes mellitus without complications: Secondary | ICD-10-CM

## 2020-09-17 DIAGNOSIS — M79675 Pain in left toe(s): Secondary | ICD-10-CM

## 2020-09-17 DIAGNOSIS — Q828 Other specified congenital malformations of skin: Secondary | ICD-10-CM

## 2020-09-17 DIAGNOSIS — B351 Tinea unguium: Secondary | ICD-10-CM

## 2020-09-17 NOTE — Progress Notes (Signed)
This patient returns to my office for at risk foot care.  This patient requires this care by a professional since this patient will be at risk due to having diabetes mellitus.and coagulation defect.   This patient is unable to cut nails herself since the patient cannot reach her nails.These nails are painful walking and wearing shoes.  This patient presents for at risk foot care today.  General Appearance  Alert, conversant and in no acute stress.  Vascular  Dorsalis pedis and posterior tibial  pulses are weakly  palpable  bilaterally.  Capillary return is within normal limits  bilaterally. Temperature is within normal limits  bilaterally.  Neurologic  Senn-Weinstein monofilament wire test diminished   bilaterally. Muscle power within normal limits bilaterally.  Nails Thick disfigured discolored nails with subungual debris  Hallux nails. No evidence of bacterial infection or drainage bilaterally.  Orthopedic  No limitations of motion  feet .  No crepitus or effusions noted.  HAV  B/L.  Hammer toes  B/L.  Haglunds deformity right foot.  Plantar flexed fifth metatarsal  B/L.  Skin  normotropic skin  noted bilaterally.  No signs of infections or ulcers noted.   Porokeratosis sub 5th met  B/L.  Onychomycosis  Pain in right toes  Pain in left toes  Porokeratosis  B/L.  Consent was obtained for treatment procedures.   Mechanical debridement of nails 1-5  bilaterally performed with a nail nipper.  Filed with dremel without incident. Calluses debrided using a # 15 blade.     Return office visit    3 months                  Told patient to return for periodic foot care and evaluation due to potential at risk complications.   Gardiner Barefoot DPM

## 2020-09-24 ENCOUNTER — Telehealth: Payer: Self-pay | Admitting: Podiatry

## 2020-09-24 NOTE — Telephone Encounter (Signed)
Pt left message checking on diabetic shoe status.She has an appt @ 4 with [facility] and was going to pick up if they were in.   Returned call and left message that the shoes/inserts are not in yet but should be shipping soon and I would call when they come in to schedule an appt.

## 2020-09-25 DIAGNOSIS — S7011XA Contusion of right thigh, initial encounter: Secondary | ICD-10-CM | POA: Diagnosis not present

## 2020-09-25 DIAGNOSIS — S0083XA Contusion of other part of head, initial encounter: Secondary | ICD-10-CM | POA: Diagnosis not present

## 2020-09-25 DIAGNOSIS — S40011A Contusion of right shoulder, initial encounter: Secondary | ICD-10-CM | POA: Diagnosis not present

## 2020-09-25 DIAGNOSIS — W010XXA Fall on same level from slipping, tripping and stumbling without subsequent striking against object, initial encounter: Secondary | ICD-10-CM | POA: Diagnosis not present

## 2020-09-25 DIAGNOSIS — S5011XA Contusion of right forearm, initial encounter: Secondary | ICD-10-CM | POA: Diagnosis not present

## 2020-09-26 ENCOUNTER — Ambulatory Visit (INDEPENDENT_AMBULATORY_CARE_PROVIDER_SITE_OTHER): Payer: Medicare HMO | Admitting: *Deleted

## 2020-09-26 ENCOUNTER — Other Ambulatory Visit: Payer: Self-pay

## 2020-09-26 DIAGNOSIS — I4892 Unspecified atrial flutter: Secondary | ICD-10-CM

## 2020-09-26 DIAGNOSIS — Z5181 Encounter for therapeutic drug level monitoring: Secondary | ICD-10-CM

## 2020-09-26 LAB — POCT INR: INR: 1.6 — AB (ref 2.0–3.0)

## 2020-09-26 NOTE — Patient Instructions (Signed)
Description   Today take 2 tablets then continue taking Warfarin 1.5 tablets daily except take 2 tablets on Saturdays. Recheck INR in 3 weeks. [phone] Main [phone]

## 2020-10-04 ENCOUNTER — Telehealth: Payer: Self-pay | Admitting: Podiatry

## 2020-10-04 NOTE — Telephone Encounter (Signed)
Pt left message yesterday afternoon @353pm  asking about status of diabetic shoes.   I returned call and tried calling pt yesterday but voicemail was full. I did send my chart message for pt to call to schedule an appt to pick up shoes as they just came in... I left message on voicemail this morning to call to schedule an appt to pick them up.Marland Kitchen

## 2020-10-07 DIAGNOSIS — J22 Unspecified acute lower respiratory infection: Secondary | ICD-10-CM | POA: Diagnosis not present

## 2020-10-07 DIAGNOSIS — Z03818 Encounter for observation for suspected exposure to other biological agents ruled out: Secondary | ICD-10-CM | POA: Diagnosis not present

## 2020-10-10 ENCOUNTER — Other Ambulatory Visit: Payer: Self-pay

## 2020-10-10 ENCOUNTER — Ambulatory Visit (INDEPENDENT_AMBULATORY_CARE_PROVIDER_SITE_OTHER): Payer: Medicare HMO | Admitting: Orthotics

## 2020-10-10 DIAGNOSIS — M2012 Hallux valgus (acquired), left foot: Secondary | ICD-10-CM | POA: Diagnosis not present

## 2020-10-10 DIAGNOSIS — M2041 Other hammer toe(s) (acquired), right foot: Secondary | ICD-10-CM | POA: Diagnosis not present

## 2020-10-10 DIAGNOSIS — E119 Type 2 diabetes mellitus without complications: Secondary | ICD-10-CM | POA: Diagnosis not present

## 2020-10-10 DIAGNOSIS — M2040 Other hammer toe(s) (acquired), unspecified foot: Secondary | ICD-10-CM

## 2020-10-10 DIAGNOSIS — M2010 Hallux valgus (acquired), unspecified foot: Secondary | ICD-10-CM

## 2020-10-10 DIAGNOSIS — Q828 Other specified congenital malformations of skin: Secondary | ICD-10-CM

## 2020-10-10 NOTE — Progress Notes (Signed)

## 2020-10-17 ENCOUNTER — Other Ambulatory Visit: Payer: Self-pay

## 2020-10-17 ENCOUNTER — Ambulatory Visit (INDEPENDENT_AMBULATORY_CARE_PROVIDER_SITE_OTHER): Payer: Medicare HMO | Admitting: *Deleted

## 2020-10-17 DIAGNOSIS — I4892 Unspecified atrial flutter: Secondary | ICD-10-CM | POA: Diagnosis not present

## 2020-10-17 DIAGNOSIS — Z5181 Encounter for therapeutic drug level monitoring: Secondary | ICD-10-CM

## 2020-10-17 LAB — POCT INR: INR: 1.5 — AB (ref 2.0–3.0)

## 2020-10-17 NOTE — Patient Instructions (Signed)
Description   Take 2 tablets of warfarin today and tomorrow then start taking Warfarin 1.5 tablets daily except take 2 tablets on Tuesday, Thursday and Saturdays. Recheck INR in 2 weeks. [phone] Main [phone]

## 2020-10-30 ENCOUNTER — Ambulatory Visit: Payer: Medicare HMO | Admitting: Cardiology

## 2020-10-30 ENCOUNTER — Other Ambulatory Visit: Payer: Self-pay

## 2020-10-30 ENCOUNTER — Encounter: Payer: Self-pay | Admitting: Cardiology

## 2020-10-30 ENCOUNTER — Ambulatory Visit (INDEPENDENT_AMBULATORY_CARE_PROVIDER_SITE_OTHER): Payer: Medicare HMO | Admitting: *Deleted

## 2020-10-30 VITALS — BP 110/70 | HR 71 | Ht 68.0 in | Wt 282.0 lb

## 2020-10-30 DIAGNOSIS — I10 Essential (primary) hypertension: Secondary | ICD-10-CM | POA: Diagnosis not present

## 2020-10-30 DIAGNOSIS — I4892 Unspecified atrial flutter: Secondary | ICD-10-CM

## 2020-10-30 DIAGNOSIS — Z5181 Encounter for therapeutic drug level monitoring: Secondary | ICD-10-CM | POA: Diagnosis not present

## 2020-10-30 LAB — POCT INR: INR: 1.8 — AB (ref 2.0–3.0)

## 2020-10-30 NOTE — Progress Notes (Signed)
Cardiology Office Note:    Date:  10/30/2020   ID:  Linda Washington, DOB 1951/08/20, MRN [id number]  PCP:  Marda Stalker, PA-C  [facility] Cardiologist:  Candee Furbish, MD  [facility] Electrophysiologist:  None   Referring MD: Marda Stalker, PA-C    History of Present Illness:    Linda Washington is a 70 y.o. female with persistent flutter here for follow-up.  This was seen following knee surgery.  She had acute on chronic diastolic heart failure found with this.  Diuresed.  Underwent TEE cardioversion but then reverted back to atrial flutter the next day.  She is overall well rate controlled.  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 Will need back injection.   Past Medical History:  Diagnosis Date  . Acute on chronic diastolic (congestive) heart failure ([place]) 06/2018  . Anxiety    takes Citalopram daily  . Arthritis    generalized  . Basal cell carcinoma   . GERD (gastroesophageal reflux disease)    takes Omeprazole daily  . History of Bell's palsy    left side  . History of bronchitis   . History of hiatal hernia   . History of kidney stones   . Hyperlipemia    takes Simvastatin daily  . Hypertension    takes Enalapril daily  . Hypothyroidism   . Incontinence    takes Ditropan daily  . Joint swelling    left knee  . Neuropathy    takes Gabapentin daily  . PONV (postoperative nausea and vomiting)   . RLS (restless legs syndrome)   . Seasonal allergies    takes Claritin daioy as needed  . Sleep apnea    pt. states that she does not have a CPAP  . Tremor   . Type 2 diabetes mellitus (HCC)    takes Metformin and Amaryl daily    Past Surgical History:  Procedure Laterality Date  . ACHILLES TENDON REPAIR Right   . APPENDECTOMY    . BASAL CELL CARCINOMA EXCISION     Nose  . BREAST LUMPECTOMY Left   . CARDIAC CATHETERIZATION     pt. states approximately 15 years ago  . CARDIOVERSION N/A 06/22/2018   Procedure: CARDIOVERSION;  Surgeon:  Buford Dresser, MD;  Location: [facility] ENDOSCOPY;  Service: Cardiovascular;  Laterality: N/A;  . CARPAL TUNNEL RELEASE Bilateral   . DILATION AND CURETTAGE OF UTERUS    . EYE SURGERY Bilateral    cataract surgery  . KNEE ARTHROSCOPY     Right  . LEFT HEART CATH AND CORONARY ANGIOGRAPHY N/A 02/01/2017   Procedure: Left Heart Cath and Coronary Angiography;  Surgeon: Lorretta Harp, MD;  Location: [place] CV LAB;  Service: Cardiovascular;  Laterality: N/A;  . LITHOTRIPSY  2013  . TEE WITHOUT CARDIOVERSION N/A 06/22/2018   Procedure: TRANSESOPHAGEAL ECHOCARDIOGRAM (TEE);  Surgeon: Buford Dresser, MD;  Location: [facility] ENDOSCOPY;  Service: Cardiovascular;  Laterality: N/A;  . TOTAL ABDOMINAL HYSTERECTOMY    . TOTAL KNEE ARTHROPLASTY Left 04/08/2015   Procedure: LEFT TOTAL KNEE ARTHROPLASTY;  Surgeon: Frederik Pear, MD;  Location: [facility];  Service: Orthopedics;  Laterality: Left;    Current Medications: Current Meds  Medication Sig  . atorvastatin (LIPITOR) 40 MG tablet Take 1 tablet (40 mg total) by mouth at bedtime.  . carvedilol (COREG) 3.125 MG tablet TAKE 1 TABLET BY MOUTH 2 TIMES DAILY W/MEAL.  . cetirizine (ZYRTEC) 10 MG tablet Take 10 mg by mouth daily as needed (for  allergies or allergic reactions).   . citalopram (CELEXA) 20 MG tablet Take 20 mg by mouth daily.  . enalapril (VASOTEC) 10 MG tablet Take 10 mg by mouth at bedtime.   . fluticasone (FLONASE) 50 MCG/ACT nasal spray Place 1-2 sprays into both nostrils daily as needed for allergies or rhinitis.  . furosemide (LASIX) 40 MG tablet Take 1 tablet (40 mg total) by mouth daily.  Marland Kitchen HYDROcodone-acetaminophen (NORCO/VICODIN) 5-325 MG tablet Take 1 tablet by mouth every 6 (six) hours as needed for moderate pain.  . metFORMIN (GLUCOPHAGE) 1000 MG tablet Take 1,000 mg by mouth 2 (two) times daily with a meal.  . omeprazole (PRILOSEC) 20 MG capsule Take 20 mg by mouth daily.  Marland Kitchen oxybutynin (DITROPAN XL) 15 MG 24 hr tablet Take 15  mg by mouth daily.  Vladimir Faster Glycol-Propyl Glycol (SYSTANE OP) Place 1-2 drops into both eyes daily.  . TRULICITY A999333 0000000 SOPN   . warfarin (COUMADIN) 5 MG tablet TAKE 1.5 TABLETS DAILY EXCEPT 2 TABLETS ON SATURDAY OR AS DIRECTED BY COUMADIN CLINIC.     Allergies:   Hydrocodone, Methocarbamol, Oxycodone, and Latex   Social History   Socioeconomic History  . Marital status: Divorced    Spouse name: Not on file  . Number of children: Not on file  . Years of education: Some colle  . Highest education level: Not on file  Occupational History  . Occupation: part-time Research scientist (physical sciences) at [employer]  Tobacco Use  . Smoking status: Never Smoker  . Smokeless tobacco: Never Used  Vaping Use  . Vaping Use: Never used  Substance and Sexual Activity  . Alcohol use: Yes    Alcohol/week: 0.0 standard drinks    Comment: Rare-wine  . Drug use: No  . Sexual activity: Not on file  Other Topics Concern  . Not on file  Social History Narrative   Lives at home with Lonn Georgia, roommate   Caffeine use: 1 Coffee day, 1-2 a day of tea, 1-2 sodas a day    Social Determinants of Health   Financial Resource Strain: Not on file  Food Insecurity: Not on file  Transportation Needs: Not on file  Physical Activity: Not on file  Stress: Not on file  Social Connections: Not on file     Family History: The patient's family history includes CAD (age of onset: 74) in her brother; Heart disease in her father and mother; Heart disease (age of onset: 73) in her brother; Heart failure in her father; Heart failure (age of onset: 24) in her mother.  ROS:   Please see the history of present illness.     All other systems reviewed and are negative.  EKGs/Labs/Other Studies Reviewed:    The following studies were reviewed today:  Cath 2018 normal coronary arteries and normal LV function   EKG:  EKG is  ordered today.  The ekg ordered today demonstrates atrial flutter 71 Recent Labs: No  results found for requested labs within last 8760 hours.  Recent Lipid Panel    Component Value Date/Time   CHOL 115 10/31/2019 1653   TRIG 65 10/31/2019 1653   HDL 52 10/31/2019 1653   CHOLHDL 2.2 10/31/2019 1653   LDLCALC 49 10/31/2019 1653      Physical Exam:    VS:  BP 110/70 (BP Location: Left Arm, Patient Position: Sitting, Cuff Size: Normal)   Pulse 71   Ht 5\' 8"  (1.727 m)   Wt 282 lb (127.9 kg)   SpO2  95%   BMI 42.88 kg/m     Wt Readings from Last 3 Encounters:  10/30/20 282 lb (127.9 kg)  10/31/19 266 lb (120.7 kg)  10/26/18 273 lb (123.8 kg)     GEN:  Well nourished, well developed in no acute distress HEENT: Normal NECK: No JVD; No carotid bruits LYMPHATICS: No lymphadenopathy CARDIAC: RRR, no murmurs, rubs, gallops RESPIRATORY:  Clear to auscultation without rales, wheezing or rhonchi  ABDOMEN: Soft, non-tender, non-distended MUSCULOSKELETAL:  No edema; No deformity  SKIN: Warm and dry NEUROLOGIC:  Alert and oriented x 3 PSYCHIATRIC:  Normal affect   ASSESSMENT:    1. Atrial flutter, unspecified type ([place])   2. Essential hypertension   3. Morbid obesity ([place])    PLAN:    In order of problems listed above:  Persistent atrial flutter - Cardioversion only successful for a few hours. - Continue with Coumadin, used to be on Xarelto -Well rate controlled.  Hemoglobin 12.4 creatinine 1.1  Chronic anticoagulation - On Coumadin last INR 1.5.  She is seeing them today.  Chronic diastolic heart failure - EF 50 to 55%.  During a prior hospitalization diuresed 7 L.  Hyperlipidemia - On atorvastatin 40 mg-LDL 49 from outside labs.  No myalgias.  Excellent.  ALT 12.  Obstructive sleep apnea - Dr. Radford Pax.  Previously recommended BiPAP titration  Morbid obesity - Difficult to lose weight now with back pain.  We will refer to the weight loss clinic at [facility].  Lumbar back pain - Dr. Herma Mering will be performing lumbar spine injections.  She will need  Coumadin holds for these.  We will coordinate this with our pharmacy clinic.        Medication Adjustments/Labs and Tests Ordered: Current medicines are reviewed at length with the patient today.  Concerns regarding medicines are outlined above.  Orders Placed This Encounter  Procedures  . Amb ref to Medical Nutrition Therapy-MNT  . EKG 12-Lead   No orders of the defined types were placed in this encounter.   Patient Instructions  Medication Instructions:  The current medical regimen is effective;  continue present plan and medications.  *If you need a refill on your cardiac medications before your next appointment, please call your pharmacy*  You have been referred to Medical Weight Management.  You will be contacted to be scheduled.  Follow-Up: At [facility], you and your health needs are our priority.  As part of our continuing mission to provide you with exceptional heart care, we have created designated Provider Care Teams.  These Care Teams include your primary Cardiologist (physician) and Advanced Practice Providers (APPs -  Physician Assistants and Nurse Practitioners) who all work together to provide you with the care you need, when you need it.  We recommend signing up for the patient portal called "MyChart".  Sign up information is provided on this After Visit Summary.  MyChart is used to connect with patients for Virtual Visits (Telemedicine).  Patients are able to view lab/test results, encounter notes, upcoming appointments, etc.  Non-urgent messages can be sent to your provider as well.   To learn more about what you can do with MyChart, go to [email or web address].    Your next appointment:   6 month(s)  The format for your next appointment:   In Person  Provider:   Candee Furbish, MD   Thank you for choosing [facility]!!        Signed, Candee Furbish, MD  10/30/2020 5:15 PM  [facility]

## 2020-10-30 NOTE — Patient Instructions (Signed)
Medication Instructions:  The current medical regimen is effective;  continue present plan and medications.  *If you need a refill on your cardiac medications before your next appointment, please call your pharmacy*  You have been referred to Medical Weight Management.  You will be contacted to be scheduled.  Follow-Up: At [facility], you and your health needs are our priority.  As part of our continuing mission to provide you with exceptional heart care, we have created designated Provider Care Teams.  These Care Teams include your primary Cardiologist (physician) and Advanced Practice Providers (APPs -  Physician Assistants and Nurse Practitioners) who all work together to provide you with the care you need, when you need it.  We recommend signing up for the patient portal called "MyChart".  Sign up information is provided on this After Visit Summary.  MyChart is used to connect with patients for Virtual Visits (Telemedicine).  Patients are able to view lab/test results, encounter notes, upcoming appointments, etc.  Non-urgent messages can be sent to your provider as well.   To learn more about what you can do with MyChart, go to [email or web address].    Your next appointment:   6 month(s)  The format for your next appointment:   In Person  Provider:   Candee Furbish, MD   Thank you for choosing [facility]!!

## 2020-10-30 NOTE — Patient Instructions (Signed)
Description    Take 2 tablets today and then start taking 2 tablets daily except for 1.5 tablets on Monday, Wednesday and Fridays. Recheck INR in 2 weeks. Coumadin Clinic [phone].

## 2020-11-04 DIAGNOSIS — I5030 Unspecified diastolic (congestive) heart failure: Secondary | ICD-10-CM | POA: Diagnosis not present

## 2020-11-04 DIAGNOSIS — M47816 Spondylosis without myelopathy or radiculopathy, lumbar region: Secondary | ICD-10-CM | POA: Diagnosis not present

## 2020-11-04 DIAGNOSIS — E1142 Type 2 diabetes mellitus with diabetic polyneuropathy: Secondary | ICD-10-CM | POA: Diagnosis not present

## 2020-11-04 DIAGNOSIS — E039 Hypothyroidism, unspecified: Secondary | ICD-10-CM | POA: Diagnosis not present

## 2020-11-04 DIAGNOSIS — G8929 Other chronic pain: Secondary | ICD-10-CM | POA: Diagnosis not present

## 2020-11-04 DIAGNOSIS — I10 Essential (primary) hypertension: Secondary | ICD-10-CM | POA: Diagnosis not present

## 2020-11-04 DIAGNOSIS — I509 Heart failure, unspecified: Secondary | ICD-10-CM | POA: Diagnosis not present

## 2020-11-04 DIAGNOSIS — E7800 Pure hypercholesterolemia, unspecified: Secondary | ICD-10-CM | POA: Diagnosis not present

## 2020-11-04 DIAGNOSIS — K219 Gastro-esophageal reflux disease without esophagitis: Secondary | ICD-10-CM | POA: Diagnosis not present

## 2020-11-08 ENCOUNTER — Telehealth: Payer: Self-pay | Admitting: Pharmacist

## 2020-11-08 NOTE — Telephone Encounter (Signed)
Patient called stating she is going to the dentist on Monday and is pretty sure she will need to have a tooth pulled. I advised that we typically do not need to hold warfarin for 1 tooth pulled. She can continue warfarin per usual

## 2020-11-13 ENCOUNTER — Other Ambulatory Visit: Payer: Self-pay

## 2020-11-13 ENCOUNTER — Ambulatory Visit (INDEPENDENT_AMBULATORY_CARE_PROVIDER_SITE_OTHER): Payer: Medicare HMO | Admitting: Pharmacist

## 2020-11-13 DIAGNOSIS — Z5181 Encounter for therapeutic drug level monitoring: Secondary | ICD-10-CM

## 2020-11-13 DIAGNOSIS — I4892 Unspecified atrial flutter: Secondary | ICD-10-CM

## 2020-11-13 LAB — POCT INR: INR: 2.1 (ref 2.0–3.0)

## 2020-11-13 NOTE — Patient Instructions (Signed)
Description   Take 2 tablets daily except for 1.5 tablets on Monday, Wednesday and Fridays. Recheck INR in 3 weeks. Coumadin Clinic [phone].

## 2020-11-20 ENCOUNTER — Other Ambulatory Visit: Payer: Self-pay | Admitting: Cardiology

## 2020-11-21 ENCOUNTER — Other Ambulatory Visit: Payer: Self-pay | Admitting: Cardiology

## 2020-11-28 DIAGNOSIS — E1142 Type 2 diabetes mellitus with diabetic polyneuropathy: Secondary | ICD-10-CM | POA: Diagnosis not present

## 2020-11-28 DIAGNOSIS — Z7984 Long term (current) use of oral hypoglycemic drugs: Secondary | ICD-10-CM | POA: Diagnosis not present

## 2020-11-28 DIAGNOSIS — Z136 Encounter for screening for cardiovascular disorders: Secondary | ICD-10-CM | POA: Diagnosis not present

## 2020-12-04 ENCOUNTER — Ambulatory Visit (INDEPENDENT_AMBULATORY_CARE_PROVIDER_SITE_OTHER): Payer: Medicare HMO | Admitting: *Deleted

## 2020-12-04 ENCOUNTER — Other Ambulatory Visit: Payer: Self-pay

## 2020-12-04 DIAGNOSIS — Z5181 Encounter for therapeutic drug level monitoring: Secondary | ICD-10-CM | POA: Diagnosis not present

## 2020-12-04 DIAGNOSIS — I4892 Unspecified atrial flutter: Secondary | ICD-10-CM

## 2020-12-04 LAB — POCT INR: INR: 2.6 (ref 2.0–3.0)

## 2020-12-04 NOTE — Patient Instructions (Addendum)
Description   Take 2 tablets daily except for 1.5 tablets on Monday, Wednesday and Fridays. Recheck INR in 4 weeks. Coumadin Clinic [phone].

## 2020-12-19 ENCOUNTER — Other Ambulatory Visit: Payer: Self-pay

## 2020-12-19 ENCOUNTER — Encounter: Payer: Medicare HMO | Attending: Internal Medicine | Admitting: Dietician

## 2020-12-19 ENCOUNTER — Encounter: Payer: Self-pay | Admitting: Dietician

## 2020-12-19 DIAGNOSIS — E1165 Type 2 diabetes mellitus with hyperglycemia: Secondary | ICD-10-CM

## 2020-12-19 NOTE — Progress Notes (Signed)
Medical Nutrition Therapy  Appointment Start time:  1100  Appointment End time:  1150  Primary concerns today: Patient would like to lose weight, learn how to control her blood sugar, and information on coumadin and diet  Referral diagnosis: Type 2 Diabetes Preferred learning style:  no preference indicated Learning readiness: ready  Patient is here today alone.  She was last seen by another RD 06/2020.  NUTRITION ASSESSMENT   Anthropometrics  278 lbs 12/19/2020 Has always had a weight problem.  Has been on Weight Watchers. 315 lbs highest adult weight 06/2019 Has been in the 250's in the past 10 years and would like to lose to <250 lbs. 170 lbs lowest adult weight  She states that she needs to lose weight to qualify for a knee replacement surgery.  Clinical Medical Hx: Type 2 Diabetes, morbid obesity, OSA Medications: see list to include Metformin, Trulicity, Lasix, coumadin Labs: A1C 7.6% 11/2020 increased from 6.2% per patient Notable Signs/Symptoms: patient notes checking weight daily due to fluid issues.  Weight fluctuates 5 pounds from day to day  Lifestyle & Dietary Hx Patient lives alone.  She works for [employer]  She enjoys writing and crocheting.  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  She does the cooking their as well. She has an elliptical.  24-Hr Dietary Recall  She uses increased herbs and avoids added salt, limits bread First Meal: egg, 1 [employer] toast, margarine, occasional low sugar jelly,  Snack: none Second Meal: sandwich and chips, ice cream OR pasta OR chili OR pancakes with regular syrup, sausage and cake yesterday at work Snack: occasional carrots or apple or chips Third Meal: angel hair  pasta with vegetables and marinara, occasionally ground [place] OR baked chicken, vegetables OR sausage and vegetable Snack: none Beverages: water, coffee with sweet and low and half and half (1 tsp), diet Pepsi (3)  Estimated Energy Needs Calories: 1600 Carbohydrate: 180g Protein: 120g Fat: 44g   NUTRITION DIAGNOSIS  NB-1.1 Food and nutrition-related knowledge deficit As related to balance of carbohydrates, protien, and fat.  As evidenced by diet hx and patient reportt.   NUTRITION INTERVENTION  Nutrition education (E-1) on the following topics:  . Coumadin and vitamin K . Low sodium . Healthy eating for weight loss and diabetes  Handouts Provided Include   Coumadin nutrition therapy from AND  Heart health, carbohydrate balanced nutrition therapy from AND  Meal plan card  Learning Style & Readiness for Change Teaching method utilized: Visual & Auditory  Demonstrated degree of understanding via: Teach Back  Barriers to learning/adherence to lifestyle change: energy, back issues  Goals Be mindful about what you buy at the grocery store.  MIndfulness:  Food choices  Eat slowly and stop when satisfied.  Silver Palate low sodium Read labels for sodium  Stay active.  Aim for some type of activity most days.  Walking  Silver Sneakers  Walk away the pounds on you tube  Sit and be fit on you tube   MONITORING & EVALUATION Dietary intake, weekly physical activity, and label reading  in 5 weeks.  Next Steps  Patient is to call for questions.

## 2020-12-19 NOTE — Patient Instructions (Addendum)
Be mindful about what you buy at the grocery store.  MIndfulness:  Food choices  Eat slowly and stop when satisfied.  Silver Palate low sodium Read labels for sodium  Stay active.  Aim for some type of activity most days.  Walking  Silver Sneakers  Walk away the pounds on you tube  Sit and be fit on you tube

## 2020-12-23 DIAGNOSIS — E039 Hypothyroidism, unspecified: Secondary | ICD-10-CM | POA: Diagnosis not present

## 2020-12-23 DIAGNOSIS — E7800 Pure hypercholesterolemia, unspecified: Secondary | ICD-10-CM | POA: Diagnosis not present

## 2020-12-23 DIAGNOSIS — I5030 Unspecified diastolic (congestive) heart failure: Secondary | ICD-10-CM | POA: Diagnosis not present

## 2020-12-23 DIAGNOSIS — K219 Gastro-esophageal reflux disease without esophagitis: Secondary | ICD-10-CM | POA: Diagnosis not present

## 2020-12-23 DIAGNOSIS — I509 Heart failure, unspecified: Secondary | ICD-10-CM | POA: Diagnosis not present

## 2020-12-23 DIAGNOSIS — I10 Essential (primary) hypertension: Secondary | ICD-10-CM | POA: Diagnosis not present

## 2020-12-23 DIAGNOSIS — G8929 Other chronic pain: Secondary | ICD-10-CM | POA: Diagnosis not present

## 2020-12-23 DIAGNOSIS — M47816 Spondylosis without myelopathy or radiculopathy, lumbar region: Secondary | ICD-10-CM | POA: Diagnosis not present

## 2020-12-23 DIAGNOSIS — E1142 Type 2 diabetes mellitus with diabetic polyneuropathy: Secondary | ICD-10-CM | POA: Diagnosis not present

## 2020-12-25 ENCOUNTER — Ambulatory Visit: Payer: Medicare HMO | Admitting: Podiatry

## 2020-12-25 ENCOUNTER — Other Ambulatory Visit: Payer: Self-pay

## 2020-12-25 ENCOUNTER — Encounter: Payer: Self-pay | Admitting: Podiatry

## 2020-12-25 DIAGNOSIS — B351 Tinea unguium: Secondary | ICD-10-CM

## 2020-12-25 DIAGNOSIS — E559 Vitamin D deficiency, unspecified: Secondary | ICD-10-CM | POA: Insufficient documentation

## 2020-12-25 DIAGNOSIS — I509 Heart failure, unspecified: Secondary | ICD-10-CM | POA: Insufficient documentation

## 2020-12-25 DIAGNOSIS — M2040 Other hammer toe(s) (acquired), unspecified foot: Secondary | ICD-10-CM | POA: Diagnosis not present

## 2020-12-25 DIAGNOSIS — M2010 Hallux valgus (acquired), unspecified foot: Secondary | ICD-10-CM | POA: Diagnosis not present

## 2020-12-25 DIAGNOSIS — G8929 Other chronic pain: Secondary | ICD-10-CM | POA: Insufficient documentation

## 2020-12-25 DIAGNOSIS — E7800 Pure hypercholesterolemia, unspecified: Secondary | ICD-10-CM | POA: Insufficient documentation

## 2020-12-25 DIAGNOSIS — M79675 Pain in left toe(s): Secondary | ICD-10-CM | POA: Diagnosis not present

## 2020-12-25 DIAGNOSIS — Z7901 Long term (current) use of anticoagulants: Secondary | ICD-10-CM | POA: Insufficient documentation

## 2020-12-25 DIAGNOSIS — R432 Parageusia: Secondary | ICD-10-CM | POA: Insufficient documentation

## 2020-12-25 DIAGNOSIS — F418 Other specified anxiety disorders: Secondary | ICD-10-CM | POA: Insufficient documentation

## 2020-12-25 DIAGNOSIS — M2042 Other hammer toe(s) (acquired), left foot: Secondary | ICD-10-CM | POA: Insufficient documentation

## 2020-12-25 DIAGNOSIS — K5900 Constipation, unspecified: Secondary | ICD-10-CM | POA: Insufficient documentation

## 2020-12-25 DIAGNOSIS — E119 Type 2 diabetes mellitus without complications: Secondary | ICD-10-CM

## 2020-12-25 DIAGNOSIS — K224 Dyskinesia of esophagus: Secondary | ICD-10-CM | POA: Insufficient documentation

## 2020-12-25 DIAGNOSIS — R269 Unspecified abnormalities of gait and mobility: Secondary | ICD-10-CM | POA: Insufficient documentation

## 2020-12-25 DIAGNOSIS — N3281 Overactive bladder: Secondary | ICD-10-CM | POA: Insufficient documentation

## 2020-12-25 DIAGNOSIS — I5030 Unspecified diastolic (congestive) heart failure: Secondary | ICD-10-CM | POA: Insufficient documentation

## 2020-12-25 DIAGNOSIS — K439 Ventral hernia without obstruction or gangrene: Secondary | ICD-10-CM | POA: Insufficient documentation

## 2020-12-25 DIAGNOSIS — M47817 Spondylosis without myelopathy or radiculopathy, lumbosacral region: Secondary | ICD-10-CM | POA: Insufficient documentation

## 2020-12-25 DIAGNOSIS — R1310 Dysphagia, unspecified: Secondary | ICD-10-CM | POA: Insufficient documentation

## 2020-12-25 DIAGNOSIS — Z860100 Personal history of colon polyps, unspecified: Secondary | ICD-10-CM | POA: Insufficient documentation

## 2020-12-25 DIAGNOSIS — E039 Hypothyroidism, unspecified: Secondary | ICD-10-CM | POA: Insufficient documentation

## 2020-12-25 DIAGNOSIS — E1142 Type 2 diabetes mellitus with diabetic polyneuropathy: Secondary | ICD-10-CM | POA: Insufficient documentation

## 2020-12-25 DIAGNOSIS — Z85828 Personal history of other malignant neoplasm of skin: Secondary | ICD-10-CM | POA: Insufficient documentation

## 2020-12-25 DIAGNOSIS — K219 Gastro-esophageal reflux disease without esophagitis: Secondary | ICD-10-CM | POA: Insufficient documentation

## 2020-12-25 NOTE — Progress Notes (Signed)
This patient returns to my office for at risk foot care.  This patient requires this care by a professional since this patient will be at risk due to having diabetes mellitus.and coagulation defect.   This patient is unable to cut nails herself since the patient cannot reach her nails.These nails are painful walking and wearing shoes.  This patient presents for at risk foot care today. ° °General Appearance  Alert, conversant and in no acute stress. ° °Vascular  Dorsalis pedis and posterior tibial  pulses are weakly  palpable  bilaterally.  Capillary return is within normal limits  bilaterally. Temperature is within normal limits  bilaterally. ° °Neurologic  Senn-Weinstein monofilament wire test diminished   bilaterally. Muscle power within normal limits bilaterally. ° °Nails Thick disfigured discolored nails with subungual debris  Hallux nails. No evidence of bacterial infection or drainage bilaterally. ° °Orthopedic  No limitations of motion  feet .  No crepitus or effusions noted.  HAV  B/L.  Hammer toes  B/L.  Haglunds deformity right foot.  Plantar flexed fifth metatarsal  B/L. ° °Skin  normotropic skin  noted bilaterally.  No signs of infections or ulcers noted.   Porokeratosis sub 5th met  B/L asymptomatic. ° °Onychomycosis  Pain in right toes  Pain in left toes   ° °Consent was obtained for treatment procedures.   Mechanical debridement of nails 1-5  bilaterally performed with a nail nipper.  Filed with dremel without incident. ° ° °Return office visit    3 months                  Told patient to return for periodic foot care and evaluation due to potential at risk complications. ° ° °Naylani Bradner DPM  °

## 2021-01-01 ENCOUNTER — Other Ambulatory Visit: Payer: Self-pay

## 2021-01-01 ENCOUNTER — Ambulatory Visit (INDEPENDENT_AMBULATORY_CARE_PROVIDER_SITE_OTHER): Payer: Medicare HMO | Admitting: *Deleted

## 2021-01-01 DIAGNOSIS — Z5181 Encounter for therapeutic drug level monitoring: Secondary | ICD-10-CM | POA: Diagnosis not present

## 2021-01-01 DIAGNOSIS — I4892 Unspecified atrial flutter: Secondary | ICD-10-CM | POA: Diagnosis not present

## 2021-01-01 LAB — POCT INR: INR: 3.2 — AB (ref 2.0–3.0)

## 2021-01-01 NOTE — Patient Instructions (Signed)
Description   Today take 1 tablet then continue taking Warfarin 2 tablets daily except for 1.5 tablets on Monday, Wednesday and Fridays. Recheck INR in 3 weeks. Coumadin Clinic [phone].

## 2021-01-09 DIAGNOSIS — D1801 Hemangioma of skin and subcutaneous tissue: Secondary | ICD-10-CM | POA: Diagnosis not present

## 2021-01-09 DIAGNOSIS — D1722 Benign lipomatous neoplasm of skin and subcutaneous tissue of left arm: Secondary | ICD-10-CM | POA: Diagnosis not present

## 2021-01-09 DIAGNOSIS — L821 Other seborrheic keratosis: Secondary | ICD-10-CM | POA: Diagnosis not present

## 2021-01-09 DIAGNOSIS — Z85828 Personal history of other malignant neoplasm of skin: Secondary | ICD-10-CM | POA: Diagnosis not present

## 2021-01-09 DIAGNOSIS — L570 Actinic keratosis: Secondary | ICD-10-CM | POA: Diagnosis not present

## 2021-01-09 DIAGNOSIS — L814 Other melanin hyperpigmentation: Secondary | ICD-10-CM | POA: Diagnosis not present

## 2021-01-27 ENCOUNTER — Other Ambulatory Visit: Payer: Self-pay

## 2021-01-27 ENCOUNTER — Ambulatory Visit (INDEPENDENT_AMBULATORY_CARE_PROVIDER_SITE_OTHER): Payer: Medicare HMO

## 2021-01-27 DIAGNOSIS — I4892 Unspecified atrial flutter: Secondary | ICD-10-CM | POA: Diagnosis not present

## 2021-01-27 DIAGNOSIS — Z5181 Encounter for therapeutic drug level monitoring: Secondary | ICD-10-CM | POA: Diagnosis not present

## 2021-01-27 LAB — POCT INR: INR: 2.8 (ref 2.0–3.0)

## 2021-01-27 NOTE — Patient Instructions (Signed)
-   continue taking Warfarin 2 tablets daily except for 1.5 tablets on Monday, Wednesday and Fridays.  - Recheck INR in 4 weeks.  Coumadin Clinic [phone].

## 2021-01-30 ENCOUNTER — Ambulatory Visit: Payer: Medicare HMO | Admitting: Dietician

## 2021-02-04 DIAGNOSIS — M1711 Unilateral primary osteoarthritis, right knee: Secondary | ICD-10-CM | POA: Diagnosis not present

## 2021-02-12 ENCOUNTER — Other Ambulatory Visit: Payer: Self-pay | Admitting: Cardiology

## 2021-02-13 DIAGNOSIS — E119 Type 2 diabetes mellitus without complications: Secondary | ICD-10-CM | POA: Diagnosis not present

## 2021-02-17 ENCOUNTER — Telehealth: Payer: Self-pay | Admitting: Cardiology

## 2021-02-17 MED ORDER — WARFARIN SODIUM 5 MG PO TABS: ORAL_TABLET | ORAL | 0 refills | Status: DC

## 2021-02-17 NOTE — Telephone Encounter (Signed)
*  STAT* If patient is at the pharmacy, call can be transferred to refill team.   1. Which medications need to be refilled? (please list name of each medication and dose if known) warfarin (COUMADIN) 5 MG tablet  2. Which pharmacy/location (including street and city if local pharmacy) is medication to be sent to? [place], [place], [place] [address]  3. Do they need a 30 day or 90 day supply? 90 day   Patient is completely out of medication [place] [facility] pharmacy stated the prescription was canceled that was sent to them. Please advise.

## 2021-02-17 NOTE — Telephone Encounter (Signed)
Pt states that the refill was canceled for [facility] and needs sent to [facility]; refill sent per request.

## 2021-02-24 ENCOUNTER — Ambulatory Visit (INDEPENDENT_AMBULATORY_CARE_PROVIDER_SITE_OTHER): Payer: Medicare HMO | Admitting: *Deleted

## 2021-02-24 ENCOUNTER — Other Ambulatory Visit: Payer: Self-pay

## 2021-02-24 DIAGNOSIS — Z5181 Encounter for therapeutic drug level monitoring: Secondary | ICD-10-CM

## 2021-02-24 DIAGNOSIS — I4892 Unspecified atrial flutter: Secondary | ICD-10-CM

## 2021-02-24 LAB — POCT INR: INR: 2.6 (ref 2.0–3.0)

## 2021-02-24 NOTE — Patient Instructions (Signed)
Description   Continue taking Warfarin 2 tablets daily except for 1.5 tablets on Monday, Wednesday and Fridays. Recheck INR in 5 weeks. Coumadin Clinic [phone].

## 2021-03-14 ENCOUNTER — Ambulatory Visit: Payer: Medicare HMO | Admitting: Dietician

## 2021-03-20 DIAGNOSIS — W57XXXA Bitten or stung by nonvenomous insect and other nonvenomous arthropods, initial encounter: Secondary | ICD-10-CM | POA: Diagnosis not present

## 2021-03-20 DIAGNOSIS — S80862A Insect bite (nonvenomous), left lower leg, initial encounter: Secondary | ICD-10-CM | POA: Diagnosis not present

## 2021-03-27 DIAGNOSIS — I10 Essential (primary) hypertension: Secondary | ICD-10-CM | POA: Diagnosis not present

## 2021-03-27 DIAGNOSIS — S80862D Insect bite (nonvenomous), left lower leg, subsequent encounter: Secondary | ICD-10-CM | POA: Diagnosis not present

## 2021-03-27 DIAGNOSIS — E039 Hypothyroidism, unspecified: Secondary | ICD-10-CM | POA: Diagnosis not present

## 2021-03-27 DIAGNOSIS — M47816 Spondylosis without myelopathy or radiculopathy, lumbar region: Secondary | ICD-10-CM | POA: Diagnosis not present

## 2021-03-27 DIAGNOSIS — E7800 Pure hypercholesterolemia, unspecified: Secondary | ICD-10-CM | POA: Diagnosis not present

## 2021-03-27 DIAGNOSIS — E1142 Type 2 diabetes mellitus with diabetic polyneuropathy: Secondary | ICD-10-CM | POA: Diagnosis not present

## 2021-03-27 DIAGNOSIS — I5030 Unspecified diastolic (congestive) heart failure: Secondary | ICD-10-CM | POA: Diagnosis not present

## 2021-03-27 DIAGNOSIS — K219 Gastro-esophageal reflux disease without esophagitis: Secondary | ICD-10-CM | POA: Diagnosis not present

## 2021-03-27 DIAGNOSIS — G8929 Other chronic pain: Secondary | ICD-10-CM | POA: Diagnosis not present

## 2021-03-27 DIAGNOSIS — W57XXXA Bitten or stung by nonvenomous insect and other nonvenomous arthropods, initial encounter: Secondary | ICD-10-CM | POA: Diagnosis not present

## 2021-03-27 DIAGNOSIS — I509 Heart failure, unspecified: Secondary | ICD-10-CM | POA: Diagnosis not present

## 2021-03-31 ENCOUNTER — Ambulatory Visit (INDEPENDENT_AMBULATORY_CARE_PROVIDER_SITE_OTHER): Payer: Medicare HMO

## 2021-03-31 ENCOUNTER — Other Ambulatory Visit: Payer: Self-pay

## 2021-03-31 DIAGNOSIS — Z5181 Encounter for therapeutic drug level monitoring: Secondary | ICD-10-CM

## 2021-03-31 DIAGNOSIS — I4892 Unspecified atrial flutter: Secondary | ICD-10-CM | POA: Diagnosis not present

## 2021-03-31 LAB — POCT INR: INR: 2.7 (ref 2.0–3.0)

## 2021-03-31 NOTE — Patient Instructions (Signed)
Continue taking Warfarin 2 tablets daily except for 1.5 tablets on Monday, Wednesday and Fridays. Recheck INR in 6 weeks. Coumadin Clinic [phone].

## 2021-04-01 ENCOUNTER — Ambulatory Visit: Payer: Medicare HMO | Admitting: Podiatry

## 2021-04-14 ENCOUNTER — Encounter: Payer: Self-pay | Admitting: Podiatry

## 2021-04-14 ENCOUNTER — Other Ambulatory Visit: Payer: Self-pay

## 2021-04-14 ENCOUNTER — Ambulatory Visit (INDEPENDENT_AMBULATORY_CARE_PROVIDER_SITE_OTHER): Payer: Medicare HMO | Admitting: Podiatry

## 2021-04-14 DIAGNOSIS — B351 Tinea unguium: Secondary | ICD-10-CM

## 2021-04-14 DIAGNOSIS — M79675 Pain in left toe(s): Secondary | ICD-10-CM

## 2021-04-14 DIAGNOSIS — M2010 Hallux valgus (acquired), unspecified foot: Secondary | ICD-10-CM | POA: Diagnosis not present

## 2021-04-14 DIAGNOSIS — E119 Type 2 diabetes mellitus without complications: Secondary | ICD-10-CM | POA: Diagnosis not present

## 2021-04-14 NOTE — Progress Notes (Signed)
This patient returns to my office for at risk foot care.  This patient requires this care by a professional since this patient will be at risk due to having diabetes mellitus.and coagulation defect.   This patient is unable to cut nails herself since the patient cannot reach her nails.These nails are painful walking and wearing shoes.  This patient presents for at risk foot care today. ° °General Appearance  Alert, conversant and in no acute stress. ° °Vascular  Dorsalis pedis and posterior tibial  pulses are weakly  palpable  bilaterally.  Capillary return is within normal limits  bilaterally. Temperature is within normal limits  bilaterally. ° °Neurologic  Senn-Weinstein monofilament wire test diminished   bilaterally. Muscle power within normal limits bilaterally. ° °Nails Thick disfigured discolored nails with subungual debris  Hallux nails. No evidence of bacterial infection or drainage bilaterally. ° °Orthopedic  No limitations of motion  feet .  No crepitus or effusions noted.  HAV  B/L.  Hammer toes  B/L.  Haglunds deformity right foot.  Plantar flexed fifth metatarsal  B/L. ° °Skin  normotropic skin  noted bilaterally.  No signs of infections or ulcers noted.   Porokeratosis sub 5th met  B/L asymptomatic. ° °Onychomycosis  Pain in right toes  Pain in left toes   ° °Consent was obtained for treatment procedures.   Mechanical debridement of nails 1-5  bilaterally performed with a nail nipper.  Filed with dremel without incident. ° ° °Return office visit    3 months                  Told patient to return for periodic foot care and evaluation due to potential at risk complications. ° ° °Linda Washington DPM  °

## 2021-04-25 ENCOUNTER — Ambulatory Visit: Payer: Medicare HMO | Admitting: Dietician

## 2021-04-30 ENCOUNTER — Telehealth: Payer: Self-pay | Admitting: Cardiology

## 2021-04-30 NOTE — Telephone Encounter (Signed)
Called and spoke to pt who stated that Dr. Nelva Bush is wanting her to have 4 to 5 injections in her back. Informed her that we will need a Clearance note from Dr.Ramos.

## 2021-04-30 NOTE — Telephone Encounter (Signed)
  PT is scheduled for a procedure on 05/06/21 and is needing to come off of Coumadin for 5-7 days.Marland Kitchen would like to know Drs recommendation.. please advise

## 2021-04-30 NOTE — Telephone Encounter (Signed)
I was asked by our CVRR team to please reach out to Dr. Nelva Bush office to obtain a formal clearance request to be faxed to our office. I have s/w Dr. Nelva Bush office and they will fax over a clearance request to [phone] ATTN: pre op team/Anaid Haney. Once I have the clearance request I will enter into epic for the pre op provider's assessment. Per previous notes procedure is set for 05/06/21 and she will have to stop coumadin 5-7 days prior.

## 2021-05-01 ENCOUNTER — Other Ambulatory Visit: Payer: Self-pay

## 2021-05-01 DIAGNOSIS — I4892 Unspecified atrial flutter: Secondary | ICD-10-CM

## 2021-05-01 MED ORDER — WARFARIN SODIUM 5 MG PO TABS: ORAL_TABLET | ORAL | 1 refills | Status: DC

## 2021-05-01 NOTE — Telephone Encounter (Signed)
I s/w Sherri with [facility], Dr. Nelva Bush surgery scheduler. S/w Sherri and confirmed procedure being done, injection. Sherri states Velicia is the scheduler who schedules injections for Dr. Nelva Bush. She states from my call last night a message was sent to Dr. Nelva Bush  who replied that there is no need to hold Warfarin for injection. Sherri assured me she will send a message to Alana to call me back and confirm if Warfarin needs to be held or not. I gave my direct ext [phone]. I did state that I will not be in the office after 12 today. If Sena calls back after 12 to then ask to s/w the pre op team call [phone]. I thanked Sherri for all of her help in this matter.

## 2021-05-02 NOTE — Telephone Encounter (Signed)
I s/w Anderson Malta with Dr. Nelva Bush office and was able to confirm per Dr. Nelva Bush no need to hold Warfarin. I thanked Anderson Malta for her help. Assured Anderson Malta that I will update our pre op provider.

## 2021-05-02 NOTE — Telephone Encounter (Signed)
Left detailed message for the pt per Dr. Nelva Bush and our Pharm-D she is to continue the Warfarin, no need to hold Warfarin. If any questions please call the office [phone] or you may call the Dr. Nelva Bush office as well. I will fax notes to Dr. Nelva Bush.

## 2021-05-05 DIAGNOSIS — I509 Heart failure, unspecified: Secondary | ICD-10-CM | POA: Diagnosis not present

## 2021-05-05 DIAGNOSIS — I10 Essential (primary) hypertension: Secondary | ICD-10-CM | POA: Diagnosis not present

## 2021-05-05 DIAGNOSIS — E7800 Pure hypercholesterolemia, unspecified: Secondary | ICD-10-CM | POA: Diagnosis not present

## 2021-05-05 DIAGNOSIS — G8929 Other chronic pain: Secondary | ICD-10-CM | POA: Diagnosis not present

## 2021-05-05 DIAGNOSIS — E1142 Type 2 diabetes mellitus with diabetic polyneuropathy: Secondary | ICD-10-CM | POA: Diagnosis not present

## 2021-05-05 DIAGNOSIS — I5030 Unspecified diastolic (congestive) heart failure: Secondary | ICD-10-CM | POA: Diagnosis not present

## 2021-05-05 DIAGNOSIS — K219 Gastro-esophageal reflux disease without esophagitis: Secondary | ICD-10-CM | POA: Diagnosis not present

## 2021-05-05 DIAGNOSIS — E039 Hypothyroidism, unspecified: Secondary | ICD-10-CM | POA: Diagnosis not present

## 2021-05-12 ENCOUNTER — Other Ambulatory Visit: Payer: Self-pay

## 2021-05-12 ENCOUNTER — Ambulatory Visit (INDEPENDENT_AMBULATORY_CARE_PROVIDER_SITE_OTHER): Payer: Medicare HMO | Admitting: *Deleted

## 2021-05-12 DIAGNOSIS — Z5181 Encounter for therapeutic drug level monitoring: Secondary | ICD-10-CM

## 2021-05-12 DIAGNOSIS — I4892 Unspecified atrial flutter: Secondary | ICD-10-CM | POA: Diagnosis not present

## 2021-05-12 LAB — POCT INR: INR: 2.3 (ref 2.0–3.0)

## 2021-05-12 NOTE — Patient Instructions (Signed)
Description   Continue taking Warfarin 2 tablets daily except for 1.5 tablets on Monday, Wednesday and Fridays. Recheck INR in 6 weeks. [facility] [phone].

## 2021-05-29 DIAGNOSIS — Z7984 Long term (current) use of oral hypoglycemic drugs: Secondary | ICD-10-CM | POA: Diagnosis not present

## 2021-05-29 DIAGNOSIS — E1165 Type 2 diabetes mellitus with hyperglycemia: Secondary | ICD-10-CM | POA: Diagnosis not present

## 2021-05-29 DIAGNOSIS — E1142 Type 2 diabetes mellitus with diabetic polyneuropathy: Secondary | ICD-10-CM | POA: Diagnosis not present

## 2021-06-18 DIAGNOSIS — H524 Presbyopia: Secondary | ICD-10-CM | POA: Diagnosis not present

## 2021-06-18 DIAGNOSIS — H02402 Unspecified ptosis of left eyelid: Secondary | ICD-10-CM | POA: Diagnosis not present

## 2021-06-18 DIAGNOSIS — H43813 Vitreous degeneration, bilateral: Secondary | ICD-10-CM | POA: Diagnosis not present

## 2021-06-18 DIAGNOSIS — E119 Type 2 diabetes mellitus without complications: Secondary | ICD-10-CM | POA: Diagnosis not present

## 2021-06-24 ENCOUNTER — Ambulatory Visit: Payer: Medicare Other | Admitting: *Deleted

## 2021-06-24 ENCOUNTER — Other Ambulatory Visit: Payer: Self-pay

## 2021-06-24 DIAGNOSIS — S61451A Open bite of right hand, initial encounter: Secondary | ICD-10-CM | POA: Diagnosis not present

## 2021-06-24 DIAGNOSIS — Z5181 Encounter for therapeutic drug level monitoring: Secondary | ICD-10-CM | POA: Diagnosis not present

## 2021-06-24 DIAGNOSIS — I4892 Unspecified atrial flutter: Secondary | ICD-10-CM

## 2021-06-24 DIAGNOSIS — W540XXA Bitten by dog, initial encounter: Secondary | ICD-10-CM | POA: Diagnosis not present

## 2021-06-24 LAB — POCT INR: INR: 2.2 (ref 2.0–3.0)

## 2021-06-24 NOTE — Patient Instructions (Signed)
Description   Continue taking Warfarin 2 tablets daily except for 1.5 tablets on Monday, Wednesday and Fridays. If you get an antibiotic call us at [phone]. Recheck INR in 6 weeks. [facility] [phone].

## 2021-06-30 ENCOUNTER — Ambulatory Visit: Payer: Medicare HMO | Admitting: Dietician

## 2021-07-02 DIAGNOSIS — G8929 Other chronic pain: Secondary | ICD-10-CM | POA: Diagnosis not present

## 2021-07-02 DIAGNOSIS — I10 Essential (primary) hypertension: Secondary | ICD-10-CM | POA: Diagnosis not present

## 2021-07-02 DIAGNOSIS — K219 Gastro-esophageal reflux disease without esophagitis: Secondary | ICD-10-CM | POA: Diagnosis not present

## 2021-07-02 DIAGNOSIS — I5030 Unspecified diastolic (congestive) heart failure: Secondary | ICD-10-CM | POA: Diagnosis not present

## 2021-07-02 DIAGNOSIS — E039 Hypothyroidism, unspecified: Secondary | ICD-10-CM | POA: Diagnosis not present

## 2021-07-02 DIAGNOSIS — E7800 Pure hypercholesterolemia, unspecified: Secondary | ICD-10-CM | POA: Diagnosis not present

## 2021-07-02 DIAGNOSIS — E1142 Type 2 diabetes mellitus with diabetic polyneuropathy: Secondary | ICD-10-CM | POA: Diagnosis not present

## 2021-07-15 ENCOUNTER — Other Ambulatory Visit: Payer: Self-pay

## 2021-07-15 ENCOUNTER — Encounter: Payer: Self-pay | Admitting: Podiatry

## 2021-07-15 ENCOUNTER — Ambulatory Visit (INDEPENDENT_AMBULATORY_CARE_PROVIDER_SITE_OTHER): Payer: Medicare Other | Admitting: Podiatry

## 2021-07-15 DIAGNOSIS — M79675 Pain in left toe(s): Secondary | ICD-10-CM | POA: Diagnosis not present

## 2021-07-15 DIAGNOSIS — M2040 Other hammer toe(s) (acquired), unspecified foot: Secondary | ICD-10-CM

## 2021-07-15 DIAGNOSIS — B351 Tinea unguium: Secondary | ICD-10-CM | POA: Diagnosis not present

## 2021-07-15 DIAGNOSIS — E119 Type 2 diabetes mellitus without complications: Secondary | ICD-10-CM

## 2021-07-15 DIAGNOSIS — M2010 Hallux valgus (acquired), unspecified foot: Secondary | ICD-10-CM

## 2021-07-15 NOTE — Progress Notes (Signed)
This patient returns to my office for at risk foot care.  This patient requires this care by a professional since this patient will be at risk due to having diabetes mellitus.and coagulation defect.   This patient is unable to cut nails herself since the patient cannot reach her nails.These nails are painful walking and wearing shoes.  This patient presents for at risk foot care today. ° °General Appearance  Alert, conversant and in no acute stress. ° °Vascular  Dorsalis pedis and posterior tibial  pulses are weakly  palpable  bilaterally.  Capillary return is within normal limits  bilaterally. Temperature is within normal limits  bilaterally. ° °Neurologic  Senn-Weinstein monofilament wire test diminished   bilaterally. Muscle power within normal limits bilaterally. ° °Nails Thick disfigured discolored nails with subungual debris  Hallux nails. No evidence of bacterial infection or drainage bilaterally. ° °Orthopedic  No limitations of motion  feet .  No crepitus or effusions noted.  HAV  B/L.  Hammer toes  B/L.  Haglunds deformity right foot.  Plantar flexed fifth metatarsal  B/L. ° °Skin  normotropic skin  noted bilaterally.  No signs of infections or ulcers noted.   Porokeratosis sub 5th met  B/L asymptomatic. ° °Onychomycosis  Pain in right toes  Pain in left toes   ° °Consent was obtained for treatment procedures.   Mechanical debridement of nails 1-5  bilaterally performed with a nail nipper.  Filed with dremel without incident. ° ° °Return office visit    3 months                  Told patient to return for periodic foot care and evaluation due to potential at risk complications. ° ° °Tahni Porchia DPM  °

## 2021-07-16 ENCOUNTER — Other Ambulatory Visit: Payer: Self-pay | Admitting: *Deleted

## 2021-07-16 DIAGNOSIS — I4892 Unspecified atrial flutter: Secondary | ICD-10-CM

## 2021-07-16 MED ORDER — WARFARIN SODIUM 5 MG PO TABS: ORAL_TABLET | ORAL | 1 refills | Status: DC

## 2021-07-16 MED ORDER — FUROSEMIDE 40 MG PO TABS: 40.0000 mg | ORAL_TABLET | Freq: Every day | ORAL | 1 refills | Status: DC

## 2021-07-16 MED ORDER — CARVEDILOL 3.125 MG PO TABS: ORAL_TABLET | ORAL | 1 refills | Status: DC

## 2021-07-16 NOTE — Telephone Encounter (Signed)
Prescription refill request received for warfarin Lov: 10/30/20 Marlou Porch)  Next INR check: 08/05/21 Warfarin tablet strength: 5mg   Appropriate dose and refill sent to requested pharmacy.

## 2021-07-30 DIAGNOSIS — M25512 Pain in left shoulder: Secondary | ICD-10-CM | POA: Diagnosis not present

## 2021-08-06 DIAGNOSIS — S46812D Strain of other muscles, fascia and tendons at shoulder and upper arm level, left arm, subsequent encounter: Secondary | ICD-10-CM | POA: Diagnosis not present

## 2021-08-06 DIAGNOSIS — M6281 Muscle weakness (generalized): Secondary | ICD-10-CM | POA: Diagnosis not present

## 2021-08-07 ENCOUNTER — Other Ambulatory Visit: Payer: Self-pay

## 2021-08-07 ENCOUNTER — Ambulatory Visit: Payer: Medicare Other | Admitting: *Deleted

## 2021-08-07 DIAGNOSIS — Z5181 Encounter for therapeutic drug level monitoring: Secondary | ICD-10-CM

## 2021-08-07 DIAGNOSIS — I4892 Unspecified atrial flutter: Secondary | ICD-10-CM | POA: Diagnosis not present

## 2021-08-07 LAB — POCT INR: INR: 2.2 (ref 2.0–3.0)

## 2021-08-07 NOTE — Patient Instructions (Signed)
Description   Continue taking Warfarin 2 tablets daily except for 1.5 tablets on Monday, Wednesday and Fridays. If you get an antibiotic call us at [phone]. Recheck INR in 6 weeks. [facility] [phone].

## 2021-08-26 DIAGNOSIS — E039 Hypothyroidism, unspecified: Secondary | ICD-10-CM | POA: Diagnosis not present

## 2021-08-26 DIAGNOSIS — N3281 Overactive bladder: Secondary | ICD-10-CM | POA: Diagnosis not present

## 2021-08-26 DIAGNOSIS — G4730 Sleep apnea, unspecified: Secondary | ICD-10-CM | POA: Diagnosis not present

## 2021-08-26 DIAGNOSIS — Z0000 Encounter for general adult medical examination without abnormal findings: Secondary | ICD-10-CM | POA: Diagnosis not present

## 2021-08-26 DIAGNOSIS — K219 Gastro-esophageal reflux disease without esophagitis: Secondary | ICD-10-CM | POA: Diagnosis not present

## 2021-08-26 DIAGNOSIS — I10 Essential (primary) hypertension: Secondary | ICD-10-CM | POA: Diagnosis not present

## 2021-08-26 DIAGNOSIS — E1142 Type 2 diabetes mellitus with diabetic polyneuropathy: Secondary | ICD-10-CM | POA: Diagnosis not present

## 2021-08-26 DIAGNOSIS — Z7901 Long term (current) use of anticoagulants: Secondary | ICD-10-CM | POA: Diagnosis not present

## 2021-08-26 DIAGNOSIS — Z23 Encounter for immunization: Secondary | ICD-10-CM | POA: Diagnosis not present

## 2021-08-26 DIAGNOSIS — E7800 Pure hypercholesterolemia, unspecified: Secondary | ICD-10-CM | POA: Diagnosis not present

## 2021-08-26 DIAGNOSIS — I4892 Unspecified atrial flutter: Secondary | ICD-10-CM | POA: Diagnosis not present

## 2021-08-29 DIAGNOSIS — M25512 Pain in left shoulder: Secondary | ICD-10-CM | POA: Diagnosis not present

## 2021-09-03 ENCOUNTER — Emergency Department (HOSPITAL_COMMUNITY): Payer: Medicare Other

## 2021-09-03 ENCOUNTER — Encounter (HOSPITAL_COMMUNITY): Payer: Self-pay

## 2021-09-03 ENCOUNTER — Inpatient Hospital Stay (HOSPITAL_COMMUNITY)
Admission: EM | Admit: 2021-09-03 | Discharge: 2021-09-08 | DRG: 199 | Disposition: A | Discharge status: Home Health | Payer: Medicare Other | Attending: Internal Medicine | Admitting: Internal Medicine

## 2021-09-03 ENCOUNTER — Observation Stay (HOSPITAL_COMMUNITY): Payer: Medicare Other

## 2021-09-03 ENCOUNTER — Other Ambulatory Visit: Payer: Self-pay

## 2021-09-03 DIAGNOSIS — S0990XA Unspecified injury of head, initial encounter: Secondary | ICD-10-CM | POA: Diagnosis not present

## 2021-09-03 DIAGNOSIS — E1165 Type 2 diabetes mellitus with hyperglycemia: Secondary | ICD-10-CM

## 2021-09-03 DIAGNOSIS — I509 Heart failure, unspecified: Secondary | ICD-10-CM

## 2021-09-03 DIAGNOSIS — I10 Essential (primary) hypertension: Secondary | ICD-10-CM | POA: Diagnosis not present

## 2021-09-03 DIAGNOSIS — Z885 Allergy status to narcotic agent status: Secondary | ICD-10-CM | POA: Diagnosis not present

## 2021-09-03 DIAGNOSIS — S271XXA Traumatic hemothorax, initial encounter: Secondary | ICD-10-CM | POA: Diagnosis not present

## 2021-09-03 DIAGNOSIS — K219 Gastro-esophageal reflux disease without esophagitis: Secondary | ICD-10-CM | POA: Diagnosis present

## 2021-09-03 DIAGNOSIS — S301XXA Contusion of abdominal wall, initial encounter: Secondary | ICD-10-CM | POA: Diagnosis present

## 2021-09-03 DIAGNOSIS — R1012 Left upper quadrant pain: Secondary | ICD-10-CM | POA: Diagnosis not present

## 2021-09-03 DIAGNOSIS — E785 Hyperlipidemia, unspecified: Secondary | ICD-10-CM | POA: Diagnosis present

## 2021-09-03 DIAGNOSIS — J942 Hemothorax: Secondary | ICD-10-CM | POA: Diagnosis not present

## 2021-09-03 DIAGNOSIS — D62 Acute posthemorrhagic anemia: Secondary | ICD-10-CM | POA: Diagnosis present

## 2021-09-03 DIAGNOSIS — Z91199 Patient's noncompliance with other medical treatment and regimen due to unspecified reason: Secondary | ICD-10-CM

## 2021-09-03 DIAGNOSIS — M852 Hyperostosis of skull: Secondary | ICD-10-CM | POA: Diagnosis not present

## 2021-09-03 DIAGNOSIS — R52 Pain, unspecified: Secondary | ICD-10-CM

## 2021-09-03 DIAGNOSIS — Z7985 Long-term (current) use of injectable non-insulin antidiabetic drugs: Secondary | ICD-10-CM

## 2021-09-03 DIAGNOSIS — Z96652 Presence of left artificial knee joint: Secondary | ICD-10-CM | POA: Diagnosis present

## 2021-09-03 DIAGNOSIS — Z043 Encounter for examination and observation following other accident: Secondary | ICD-10-CM | POA: Diagnosis not present

## 2021-09-03 DIAGNOSIS — Z7901 Long term (current) use of anticoagulants: Secondary | ICD-10-CM | POA: Diagnosis not present

## 2021-09-03 DIAGNOSIS — Z888 Allergy status to other drugs, medicaments and biological substances status: Secondary | ICD-10-CM

## 2021-09-03 DIAGNOSIS — W1809XA Striking against other object with subsequent fall, initial encounter: Secondary | ICD-10-CM | POA: Diagnosis present

## 2021-09-03 DIAGNOSIS — Z6841 Body Mass Index (BMI) 40.0 and over, adult: Secondary | ICD-10-CM | POA: Diagnosis not present

## 2021-09-03 DIAGNOSIS — T148XXA Other injury of unspecified body region, initial encounter: Secondary | ICD-10-CM

## 2021-09-03 DIAGNOSIS — S2242XA Multiple fractures of ribs, left side, initial encounter for closed fracture: Principal | ICD-10-CM | POA: Diagnosis present

## 2021-09-03 DIAGNOSIS — M25552 Pain in left hip: Secondary | ICD-10-CM | POA: Diagnosis not present

## 2021-09-03 DIAGNOSIS — M19012 Primary osteoarthritis, left shoulder: Secondary | ICD-10-CM | POA: Diagnosis not present

## 2021-09-03 DIAGNOSIS — J9601 Acute respiratory failure with hypoxia: Secondary | ICD-10-CM | POA: Diagnosis not present

## 2021-09-03 DIAGNOSIS — T45515A Adverse effect of anticoagulants, initial encounter: Secondary | ICD-10-CM | POA: Diagnosis present

## 2021-09-03 DIAGNOSIS — I959 Hypotension, unspecified: Secondary | ICD-10-CM | POA: Diagnosis not present

## 2021-09-03 DIAGNOSIS — I5032 Chronic diastolic (congestive) heart failure: Secondary | ICD-10-CM

## 2021-09-03 DIAGNOSIS — F419 Anxiety disorder, unspecified: Secondary | ICD-10-CM | POA: Diagnosis present

## 2021-09-03 DIAGNOSIS — R0781 Pleurodynia: Secondary | ICD-10-CM | POA: Diagnosis not present

## 2021-09-03 DIAGNOSIS — Z7984 Long term (current) use of oral hypoglycemic drugs: Secondary | ICD-10-CM

## 2021-09-03 DIAGNOSIS — Z85828 Personal history of other malignant neoplasm of skin: Secondary | ICD-10-CM

## 2021-09-03 DIAGNOSIS — Z79899 Other long term (current) drug therapy: Secondary | ICD-10-CM

## 2021-09-03 DIAGNOSIS — I517 Cardiomegaly: Secondary | ICD-10-CM | POA: Diagnosis not present

## 2021-09-03 DIAGNOSIS — M549 Dorsalgia, unspecified: Secondary | ICD-10-CM | POA: Diagnosis not present

## 2021-09-03 DIAGNOSIS — D6832 Hemorrhagic disorder due to extrinsic circulating anticoagulants: Secondary | ICD-10-CM | POA: Diagnosis not present

## 2021-09-03 DIAGNOSIS — Z9049 Acquired absence of other specified parts of digestive tract: Secondary | ICD-10-CM | POA: Diagnosis not present

## 2021-09-03 DIAGNOSIS — I4892 Unspecified atrial flutter: Secondary | ICD-10-CM | POA: Diagnosis present

## 2021-09-03 DIAGNOSIS — S2243XA Multiple fractures of ribs, bilateral, initial encounter for closed fracture: Secondary | ICD-10-CM | POA: Diagnosis not present

## 2021-09-03 DIAGNOSIS — J302 Other seasonal allergic rhinitis: Secondary | ICD-10-CM | POA: Diagnosis present

## 2021-09-03 DIAGNOSIS — R109 Unspecified abdominal pain: Secondary | ICD-10-CM

## 2021-09-03 DIAGNOSIS — Z91040 Latex allergy status: Secondary | ICD-10-CM

## 2021-09-03 DIAGNOSIS — M542 Cervicalgia: Secondary | ICD-10-CM | POA: Diagnosis not present

## 2021-09-03 DIAGNOSIS — I482 Chronic atrial fibrillation, unspecified: Secondary | ICD-10-CM | POA: Diagnosis present

## 2021-09-03 DIAGNOSIS — G4733 Obstructive sleep apnea (adult) (pediatric): Secondary | ICD-10-CM | POA: Diagnosis present

## 2021-09-03 DIAGNOSIS — J90 Pleural effusion, not elsewhere classified: Secondary | ICD-10-CM | POA: Diagnosis not present

## 2021-09-03 DIAGNOSIS — I110 Hypertensive heart disease with heart failure: Secondary | ICD-10-CM | POA: Diagnosis present

## 2021-09-03 DIAGNOSIS — W19XXXA Unspecified fall, initial encounter: Secondary | ICD-10-CM | POA: Diagnosis not present

## 2021-09-03 DIAGNOSIS — Z87442 Personal history of urinary calculi: Secondary | ICD-10-CM | POA: Diagnosis not present

## 2021-09-03 DIAGNOSIS — S2239XA Fracture of one rib, unspecified side, initial encounter for closed fracture: Secondary | ICD-10-CM

## 2021-09-03 DIAGNOSIS — Z743 Need for continuous supervision: Secondary | ICD-10-CM | POA: Diagnosis not present

## 2021-09-03 DIAGNOSIS — M7981 Nontraumatic hematoma of soft tissue: Secondary | ICD-10-CM | POA: Diagnosis not present

## 2021-09-03 DIAGNOSIS — Z20822 Contact with and (suspected) exposure to covid-19: Secondary | ICD-10-CM | POA: Diagnosis present

## 2021-09-03 DIAGNOSIS — K429 Umbilical hernia without obstruction or gangrene: Secondary | ICD-10-CM | POA: Diagnosis not present

## 2021-09-03 DIAGNOSIS — E119 Type 2 diabetes mellitus without complications: Secondary | ICD-10-CM

## 2021-09-03 DIAGNOSIS — Z8249 Family history of ischemic heart disease and other diseases of the circulatory system: Secondary | ICD-10-CM

## 2021-09-03 DIAGNOSIS — Z794 Long term (current) use of insulin: Secondary | ICD-10-CM

## 2021-09-03 LAB — HEMOGLOBIN A1C
Hgb A1c MFr Bld: 7.7 % — ABNORMAL HIGH (ref 4.8–5.6)
Mean Plasma Glucose: 174.29 mg/dL

## 2021-09-03 LAB — CBC WITH DIFFERENTIAL/PLATELET
Abs Immature Granulocytes: 0.16 10*3/uL — ABNORMAL HIGH (ref 0.00–0.07)
Basophils Absolute: 0.1 10*3/uL (ref 0.0–0.1)
Basophils Relative: 0 %
Eosinophils Absolute: 0.1 10*3/uL (ref 0.0–0.5)
Eosinophils Relative: 1 %
HCT: 32.1 % — ABNORMAL LOW (ref 36.0–46.0)
Hemoglobin: 10.4 g/dL — ABNORMAL LOW (ref 12.0–15.0)
Immature Granulocytes: 1 %
Lymphocytes Relative: 7 %
Lymphs Abs: 1.3 10*3/uL (ref 0.7–4.0)
MCH: 29.3 pg (ref 26.0–34.0)
MCHC: 32.4 g/dL (ref 30.0–36.0)
MCV: 90.4 fL (ref 80.0–100.0)
Monocytes Absolute: 0.8 10*3/uL (ref 0.1–1.0)
Monocytes Relative: 4 %
Neutro Abs: 16.6 10*3/uL — ABNORMAL HIGH (ref 1.7–7.7)
Neutrophils Relative %: 87 %
Platelets: 334 10*3/uL (ref 150–400)
RBC: 3.55 MIL/uL — ABNORMAL LOW (ref 3.87–5.11)
RDW: 14.7 % (ref 11.5–15.5)
WBC: 19.1 10*3/uL — ABNORMAL HIGH (ref 4.0–10.5)
nRBC: 0 % (ref 0.0–0.2)

## 2021-09-03 LAB — PROTIME-INR
INR: 3.2 — ABNORMAL HIGH (ref 0.8–1.2)
Prothrombin Time: 32.4 seconds — ABNORMAL HIGH (ref 11.4–15.2)

## 2021-09-03 LAB — COMPREHENSIVE METABOLIC PANEL
ALT: 14 U/L (ref 0–44)
AST: 19 U/L (ref 15–41)
Albumin: 2.8 g/dL — ABNORMAL LOW (ref 3.5–5.0)
Alkaline Phosphatase: 63 U/L (ref 38–126)
Anion gap: 12 (ref 5–15)
BUN: 15 mg/dL (ref 8–23)
CO2: 22 mmol/L (ref 22–32)
Calcium: 8.3 mg/dL — ABNORMAL LOW (ref 8.9–10.3)
Chloride: 104 mmol/L (ref 98–111)
Creatinine, Ser: 0.87 mg/dL (ref 0.44–1.00)
GFR, Estimated: >60 mL/min (ref >60)
Glucose, Bld: 219 mg/dL — ABNORMAL HIGH (ref 70–99)
Potassium: 4.1 mmol/L (ref 3.5–5.1)
Sodium: 138 mmol/L (ref 135–145)
Total Bilirubin: 0.9 mg/dL (ref 0.3–1.2)
Total Protein: 5.7 g/dL — ABNORMAL LOW (ref 6.5–8.1)

## 2021-09-03 LAB — RESP PANEL BY RT-PCR (FLU A&B, COVID) ARPGX2
Influenza A by PCR: NEGATIVE
Influenza B by PCR: NEGATIVE
SARS Coronavirus 2 by RT PCR: NEGATIVE

## 2021-09-03 LAB — HEMOGLOBIN AND HEMATOCRIT, BLOOD
HCT: 30.3 % — ABNORMAL LOW (ref 36.0–46.0)
Hemoglobin: 9.8 g/dL — ABNORMAL LOW (ref 12.0–15.0)

## 2021-09-03 MED ORDER — VITAMIN K1 10 MG/ML IJ SOLN
1.0000 mg | Freq: Once | INTRAVENOUS | Status: AC
  Administered 2021-09-03: 1 mg via INTRAVENOUS
  Filled 2021-09-03 (×2): qty 0.1

## 2021-09-03 MED ORDER — INSULIN ASPART 100 UNIT/ML IJ SOLN
0.0000 [IU] | Freq: Three times a day (TID) | INTRAMUSCULAR | Status: DC
Start: 2021-09-03 — End: 2021-09-04
  Administered 2021-09-03 – 2021-09-04 (×2): 5 [IU] via SUBCUTANEOUS

## 2021-09-03 MED ORDER — ONDANSETRON 4 MG PO TBDP: 4.0000 mg | ORAL_TABLET | Freq: Once | ORAL | Status: DC

## 2021-09-03 MED ORDER — CITALOPRAM HYDROBROMIDE 20 MG PO TABS
20.0000 mg | ORAL_TABLET | Freq: Every day | ORAL | Status: DC
  Administered 2021-09-03 – 2021-09-08 (×6): 20 mg via ORAL
  Filled 2021-09-03 (×6): qty 1

## 2021-09-03 MED ORDER — PANTOPRAZOLE SODIUM 40 MG PO TBEC
40.0000 mg | DELAYED_RELEASE_TABLET | Freq: Every day | ORAL | Status: DC
  Administered 2021-09-03 – 2021-09-08 (×6): 40 mg via ORAL
  Filled 2021-09-03 (×6): qty 1

## 2021-09-03 MED ORDER — ONDANSETRON HCL 4 MG/2ML IJ SOLN
4.0000 mg | Freq: Once | INTRAMUSCULAR | Status: AC
  Administered 2021-09-03: 4 mg via INTRAVENOUS
  Filled 2021-09-03: qty 2

## 2021-09-03 MED ORDER — FENTANYL CITRATE PF 50 MCG/ML IJ SOSY
25.0000 ug | PREFILLED_SYRINGE | Freq: Once | INTRAMUSCULAR | Status: DC
  Filled 2021-09-03: qty 1

## 2021-09-03 MED ORDER — ATORVASTATIN CALCIUM 40 MG PO TABS
40.0000 mg | ORAL_TABLET | Freq: Every day | ORAL | Status: DC
  Administered 2021-09-03 – 2021-09-07 (×5): 40 mg via ORAL
  Filled 2021-09-03 (×2): qty 1
  Filled 2021-09-03: qty 4
  Filled 2021-09-03 (×2): qty 1

## 2021-09-03 MED ORDER — PROTHROMBIN COMPLEX CONC HUMAN 500 UNITS IV KIT: 1500.0000 [IU] | PACK | Status: DC

## 2021-09-03 MED ORDER — MORPHINE SULFATE (PF) 4 MG/ML IV SOLN
4.0000 mg | Freq: Once | INTRAVENOUS | Status: AC
  Administered 2021-09-03: 4 mg via INTRAVENOUS
  Filled 2021-09-03: qty 1

## 2021-09-03 MED ORDER — ONDANSETRON HCL 4 MG/2ML IJ SOLN
4.0000 mg | Freq: Four times a day (QID) | INTRAMUSCULAR | Status: AC
  Administered 2021-09-03 – 2021-09-04 (×2): 4 mg via INTRAVENOUS
  Filled 2021-09-03 (×2): qty 2

## 2021-09-03 MED ORDER — ACETAMINOPHEN 325 MG PO TABS: 650.0000 mg | ORAL_TABLET | Freq: Three times a day (TID) | ORAL | Status: DC | PRN

## 2021-09-03 MED ORDER — CARVEDILOL 3.125 MG PO TABS
3.1250 mg | ORAL_TABLET | Freq: Two times a day (BID) | ORAL | Status: DC
  Administered 2021-09-04 – 2021-09-08 (×8): 3.125 mg via ORAL
  Filled 2021-09-03 (×9): qty 1

## 2021-09-03 MED ORDER — HYDROMORPHONE HCL 1 MG/ML IJ SOLN
1.0000 mg | INTRAMUSCULAR | Status: DC | PRN
  Administered 2021-09-03 – 2021-09-04 (×3): 1 mg via INTRAVENOUS
  Filled 2021-09-03 (×3): qty 1

## 2021-09-03 MED ORDER — PROTHROMBIN COMPLEX CONC HUMAN 500 UNITS IV KIT
2028.0000 [IU] | PACK | Status: AC
  Administered 2021-09-03: 2028 [IU] via INTRAVENOUS
  Filled 2021-09-03: qty 2028

## 2021-09-03 MED ORDER — SODIUM CHLORIDE 0.9 % IV BOLUS
500.0000 mL | Freq: Once | INTRAVENOUS | Status: AC
  Administered 2021-09-03: 500 mL via INTRAVENOUS

## 2021-09-03 MED ORDER — OXYBUTYNIN CHLORIDE ER 15 MG PO TB24
15.0000 mg | ORAL_TABLET | Freq: Every day | ORAL | Status: DC
  Administered 2021-09-03 – 2021-09-08 (×6): 15 mg via ORAL
  Filled 2021-09-03 (×6): qty 1

## 2021-09-03 MED ORDER — MORPHINE SULFATE (PF) 2 MG/ML IV SOLN
2.0000 mg | INTRAVENOUS | Status: DC | PRN
  Filled 2021-09-03: qty 1

## 2021-09-03 MED ORDER — IOHEXOL 350 MG/ML SOLN
100.0000 mL | Freq: Once | INTRAVENOUS | Status: AC | PRN
  Administered 2021-09-03: 100 mL via INTRAVENOUS

## 2021-09-03 NOTE — ED Triage Notes (Signed)
Walking through her front door slipped and on the way door hit a table landing on left side pain to left back rib pain and to the chest area.  Bruising reported by EMS to the back denies LOC on Warfarin

## 2021-09-03 NOTE — H&P (Signed)
History   Linda Washington is an 70 y.o. female.   Chief Complaint:  Chief Complaint  Patient presents with   Fall    Patient is a 70 year old female who states that she fell today while entering her door.  She states that on the fall she did had a corner table.  She states that she had no LOC.  Of note patient was on Coumadin secondary to a flutter.  She has a history of hypertension, hyperlipidemia, CHF.  Patient underwent CT scan.  This was significant for large muscular hematoma left chest wall, ribs 9 through 11 rib fractures, left-sided posterior hemothorax.  Trauma surgery was consulted for consultation and management.   Past Medical History:  Diagnosis Date   Acute on chronic diastolic (congestive) heart failure ([place]) 06/2018   Anxiety    takes Citalopram daily   Arthritis    generalized   Basal cell carcinoma    GERD (gastroesophageal reflux disease)    takes Omeprazole daily   History of Bell's palsy    left side   History of bronchitis    History of hiatal hernia    History of kidney stones    Hyperlipemia    takes Simvastatin daily   Hypertension    takes Enalapril daily   Hypothyroidism    Incontinence    takes Ditropan daily   Joint swelling    left knee   Neuropathy    takes Gabapentin daily   PONV (postoperative nausea and vomiting)    RLS (restless legs syndrome)    Seasonal allergies    takes Claritin daioy as needed   Sleep apnea    pt. states that she does not have a CPAP   Tremor    Type 2 diabetes mellitus ([place])    takes Metformin and Amaryl daily    Past Surgical History:  Procedure Laterality Date   ACHILLES TENDON REPAIR Right    APPENDECTOMY     BASAL CELL CARCINOMA EXCISION     Nose   BREAST LUMPECTOMY Left    CARDIAC CATHETERIZATION     pt. states approximately 15 years ago   CARDIOVERSION N/A 06/22/2018   Procedure: CARDIOVERSION;  Surgeon: Buford Dresser, MD;  Location: [facility] ENDOSCOPY;  Service: Cardiovascular;   Laterality: N/A;   CARPAL TUNNEL RELEASE Bilateral    DILATION AND CURETTAGE OF UTERUS     EYE SURGERY Bilateral    cataract surgery   KNEE ARTHROSCOPY     Right   LEFT HEART CATH AND CORONARY ANGIOGRAPHY N/A 02/01/2017   Procedure: Left Heart Cath and Coronary Angiography;  Surgeon: Lorretta Harp, MD;  Location: [facility];  Service: Cardiovascular;  Laterality: N/A;   LITHOTRIPSY  2013   TEE WITHOUT CARDIOVERSION N/A 06/22/2018   Procedure: TRANSESOPHAGEAL ECHOCARDIOGRAM (TEE);  Surgeon: Buford Dresser, MD;  Location: [facility] ENDOSCOPY;  Service: Cardiovascular;  Laterality: N/A;   TOTAL ABDOMINAL HYSTERECTOMY     TOTAL KNEE ARTHROPLASTY Left 04/08/2015   Procedure: LEFT TOTAL KNEE ARTHROPLASTY;  Surgeon: Frederik Pear, MD;  Location: [place] [facility];  Service: Orthopedics;  Laterality: Left;    Family History  Problem Relation Age of Onset   Heart disease Mother    Heart failure Mother 63   Heart disease Father    Heart failure Father    Heart disease Brother 60   CAD Brother 34   Social History:  reports that she has never smoked. She has never used smokeless tobacco. She reports current alcohol use.  She reports that she does not use drugs.  Allergies   Allergies  Allergen Reactions   Cyclobenzaprine Other (See Comments)   Gabapentin Other (See Comments)   Hydrocodone Other (See Comments)    Weird sensations mentally; "makes me fuzzy"   Methocarbamol Hives   Oxycodone Other (See Comments)    Weird sensations mentally; "makes me fuzzy"   Tinidazole     Other reaction(s): hives   Latex Hives and Rash    Home Medications  (Not in a hospital admission)   Trauma Course   Results for orders placed or performed during the hospital encounter of 09/03/21 (from the past 48 hour(s))  Comprehensive metabolic panel     Status: Abnormal   Collection Time: 09/03/21  4:32 PM  Result Value Ref Range   Sodium 138 135 - 145 mmol/L   Potassium 4.1 3.5 - 5.1 mmol/L   Chloride 104  98 - 111 mmol/L   CO2 22 22 - 32 mmol/L   Glucose, Bld 219 (H) 70 - 99 mg/dL    Comment: Glucose reference range applies only to samples taken after fasting for at least 8 hours.   BUN 15 8 - 23 mg/dL   Creatinine, Ser 0.87 0.44 - 1.00 mg/dL   Calcium 8.3 (L) 8.9 - 10.3 mg/dL   Total Protein 5.7 (L) 6.5 - 8.1 g/dL   Albumin 2.8 (L) 3.5 - 5.0 g/dL   AST 19 15 - 41 U/L   ALT 14 0 - 44 U/L   Alkaline Phosphatase 63 38 - 126 U/L   Total Bilirubin 0.9 0.3 - 1.2 mg/dL   GFR, Estimated >60 >60 mL/min    Comment: (NOTE) Calculated using the CKD-EPI Creatinine Equation (2021)    Anion gap 12 5 - 15    Comment: Performed at [facility], [place] [address] [place], [place] [address]  CBC with Differential     Status: Abnormal   Collection Time: 09/03/21  4:32 PM  Result Value Ref Range   WBC 19.1 (H) 4.0 - 10.5 K/uL   RBC 3.55 (L) 3.87 - 5.11 MIL/uL   Hemoglobin 10.4 (L) 12.0 - 15.0 g/dL   HCT 32.1 (L) 36.0 - 46.0 %   MCV 90.4 80.0 - 100.0 fL   MCH 29.3 26.0 - 34.0 pg   MCHC 32.4 30.0 - 36.0 g/dL   RDW 14.7 11.5 - 15.5 %   Platelets 334 150 - 400 K/uL   nRBC 0.0 0.0 - 0.2 %   Neutrophils Relative % 87 %   Neutro Abs 16.6 (H) 1.7 - 7.7 K/uL   Lymphocytes Relative 7 %   Lymphs Abs 1.3 0.7 - 4.0 K/uL   Monocytes Relative 4 %   Monocytes Absolute 0.8 0.1 - 1.0 K/uL   Eosinophils Relative 1 %   Eosinophils Absolute 0.1 0.0 - 0.5 K/uL   Basophils Relative 0 %   Basophils Absolute 0.1 0.0 - 0.1 K/uL   Immature Granulocytes 1 %   Abs Immature Granulocytes 0.16 (H) 0.00 - 0.07 K/uL    Comment: Performed at [place] [address], [place], [place] [address]  Protime-INR     Status: Abnormal   Collection Time: 09/03/21  4:32 PM  Result Value Ref Range   Prothrombin Time 32.4 (H) 11.4 - 15.2 seconds   INR 3.2 (H) 0.8 - 1.2    Comment: (NOTE) INR goal varies based on device and disease states. Performed at [facility], [place]  [address], [place], [place] [address]    Type and screen [place]     Status: None   Collection Time: 09/03/21  5:11 PM  Result Value Ref Range   ABO/RH(D) A POS    Antibody Screen NEG    Sample Expiration      09/06/2021,2359 Performed at [facility], [place] [address] [place], [place] [address]    CT HEAD WO CONTRAST (5MM)  Result Date: 09/03/2021 CLINICAL DATA:  Head trauma, minor (Age >= 65y) Fall with head and neck pain. EXAM: CT HEAD WITHOUT CONTRAST TECHNIQUE: Contiguous axial images were obtained from the base of the skull through the vertex without intravenous contrast. COMPARISON:  None. FINDINGS: Brain: No intracranial hemorrhage, mass effect, or midline shift. No hydrocephalus. The basilar cisterns are patent. No evidence of territorial infarct or acute ischemia. No extra-axial or intracranial fluid collection. Vascular: No hyperdense vessel or unexpected calcification. Skull: No fracture or focal lesion.  Frontal hyperostosis. Sinuses/Orbits: Paranasal sinuses and mastoid air cells are clear. The visualized orbits are unremarkable. Bilateral cataract resection. Other: None.  No confluent scalp hematoma. IMPRESSION: No acute intracranial abnormality. No skull fracture. Electronically Signed   By: Keith Rake M.D.   On: 09/03/2021 19:34   CT Cervical Spine Wo Contrast  Result Date: 09/03/2021 CLINICAL DATA:  Neck trauma (Age >= 65y) Post fall with head and neck pain. EXAM: CT CERVICAL SPINE WITHOUT CONTRAST TECHNIQUE: Multidetector CT imaging of the cervical spine was performed without intravenous contrast. Multiplanar CT image reconstructions were also generated. COMPARISON:  None. FINDINGS: Alignment: Normal. Skull base and vertebrae: No acute fracture. Vertebral body heights are maintained. The dens and skull base are intact. Soft tissues and spinal canal: No prevertebral fluid or swelling. No visible canal hematoma. Disc levels: Disc space narrowing and anterior spurring C4-C5 through C6-C7.  There is multilevel facet hypertrophy. Upper chest: Left pleural effusion. Assessed on concurrent chest CT, reported separately. Other: None. IMPRESSION: Degenerative change in the cervical spine without acute fracture or subluxation. Electronically Signed   By: Keith Rake M.D.   On: 09/03/2021 19:38   DG Pelvis Portable  Result Date: 09/03/2021 CLINICAL DATA:  Fall EXAM: PORTABLE PELVIS 1 VIEW COMPARISON:  None. FINDINGS: Evaluation is limited by body habitus and single view. Within this limitation, there is no evidence of pelvic fracture or diastasis. No pelvic bone lesions are seen. IMPRESSION: Negative. Electronically Signed   By: Merilyn Baba M.D.   On: 09/03/2021 17:14   CT CHEST ABDOMEN PELVIS W CONTRAST  Result Date: 09/03/2021 CLINICAL DATA:  Trauma and left flank pain. EXAM: CT CHEST, ABDOMEN, AND PELVIS WITH CONTRAST TECHNIQUE: Multidetector CT imaging of the chest, abdomen and pelvis was performed following the standard protocol during bolus administration of intravenous contrast. CONTRAST:  175mL OMNIPAQUE IOHEXOL 350 MG/ML SOLN COMPARISON:  CT of the abdomen pelvis dated 02/01/2018. Chest radiograph dated 09/03/2021. Five FINDINGS: CT CHEST FINDINGS Cardiovascular: No cardiomegaly or pericardial effusion. The thoracic aorta is unremarkable. The origins of the great vessels of the aortic arch appear patent as visualized. The central pulmonary arteries are unremarkable. Mediastinum/Nodes: No hilar or mediastinal adenopathy. The esophagus is grossly unremarkable. Heterogeneous thyroid gland with small subcentimeter hypodense nodules as well as foci of calcification. Not clinically significant; no follow-up imaging recommended (ref: J Am Coll Radiol. 2015 Feb;12(2): 143-50).No mediastinal fluid collection. Lungs/Pleura: Moderate size high attenuating left pleural effusion consistent with hemothorax. There is partial consolidative changes of the left lower lobe. Minimal  right lung base  atelectasis. Tiny focus of pneumothorax may be present in the anterior left apex (22/5 and sagittal 99/7). No large pneumothorax. The central airways are patent. Musculoskeletal: Displaced fractures of the posterior left eighth, 9, 10, and eleventh ribs. The tenth and eleventh ribs are fractured in 2 places and severely displaced. Clinical correlation is recommended to evaluate for possibility of flail chest. There is inward angulation and displacement of a fracture fragment of the tenth rib abutting the lateral aspect of the spleen. There is a large hematoma in the soft tissues of the left lateral chest wall and left flank measuring up to 8 x 20 cm in greatest axial dimensions and 13 cm in craniocaudal length. Several faint small foci of contrast blush (95/6) noted suggestive of active bleed. CT ABDOMEN PELVIS FINDINGS No intra-abdominal free air or free fluid. Hepatobiliary: Indeterminate 16 mm hypodense lesion in the left lobe of the liver, present on the pelvic CT. No intrahepatic biliary ductal dilatation. The gallbladder is unremarkable. Pancreas: Unremarkable. No pancreatic ductal dilatation or surrounding inflammatory changes. Spleen: Normal in size without focal abnormality. Adrenals/Urinary Tract: The adrenal glands unremarkable. There is no hydronephrosis on either side. Subcentimeter right renal hypodense lesion is not characterized. There is symmetric enhancement and excretion of contrast by both kidneys. The visualized ureters and urinary bladder appear unremarkable. Stomach/Bowel: There is no bowel obstruction or active inflammation. Appendectomy. Vascular/Lymphatic: The abdominal aorta and IVC are unremarkable. No portal venous gas. There is no adenopathy. Reproductive: Hysterectomy.  No adnexal masses Other: Small fat containing umbilical hernia. Musculoskeletal: Degenerative changes of the spine. No acute osseous pathology. IMPRESSION: 1. Displaced fractures of the posterior left eighth, 9, 10, and  eleventh ribs. The tenth and eleventh ribs are fractured in 2 places and severely displaced. 2. Moderate left hemothorax with partial consolidative changes of the left lower lobe. 3. Possible tiny left apical pneumothorax. 4. Large hematoma in the soft tissues of the left lateral chest wall and left flank with evidence of active bleed. 5. No acute intra-abdominal or pelvic pathology. These results were called by telephone at the time of interpretation on 09/03/2021 at 7:42 pm to provider JOSHUA LONG , who verbally acknowledged these results. Electronically Signed   By: Anner Crete M.D.   On: 09/03/2021 19:46   DG Chest Portable 1 View  Result Date: 09/03/2021 CLINICAL DATA:  Fall. EXAM: PORTABLE CHEST 1 VIEW COMPARISON:  Chest x-ray 06/20/2018. FINDINGS: Heart is mildly enlarged, unchanged. There are minimal interstitial opacities in the left lung base. The lungs are otherwise clear. No pleural effusion or pneumothorax. No acute fractures are seen. IMPRESSION: 1. Cardiomegaly. 2. Minimal interstitial opacities in the left lung base may represent infection or atelectasis. Electronically Signed   By: Ronney Asters M.D.   On: 09/03/2021 17:15    Review of Systems  HENT:  Negative for ear discharge, ear pain, hearing loss and tinnitus.   Eyes:  Negative for photophobia and pain.  Respiratory:  Negative for apnea, cough, shortness of breath and wheezing.   Cardiovascular:  Negative for chest pain.  Gastrointestinal:  Negative for abdominal pain, nausea and vomiting.  Genitourinary:  Negative for dysuria, flank pain, frequency and urgency.  Musculoskeletal:  Negative for back pain, myalgias and neck pain.  Neurological:  Negative for dizziness and headaches.  Hematological:  Does not bruise/bleed easily.  Psychiatric/Behavioral:  The patient is not nervous/anxious.    Blood pressure 100/62, pulse 88, resp. rate 14, height 5\' 8"  (1.727 m), weight  127.9 kg, SpO2 96 %. Physical Exam Vitals reviewed.   Constitutional:      General: She is not in acute distress.    Appearance: Normal appearance. She is well-developed. She is not diaphoretic.     Interventions: Cervical collar and nasal cannula in place.  HENT:     Head: Normocephalic and atraumatic. No raccoon eyes, Battle's sign, abrasion, contusion or laceration.     Right Ear: Hearing, tympanic membrane, ear canal and external ear normal. No laceration, drainage or tenderness. No foreign body. No hemotympanum. Tympanic membrane is not perforated.     Left Ear: Hearing, tympanic membrane, ear canal and external ear normal. No laceration, drainage or tenderness. No foreign body. No hemotympanum. Tympanic membrane is not perforated.     Nose: Nose normal. No nasal deformity or laceration.     Mouth/Throat:     Mouth: No lacerations.     Pharynx: Uvula midline.  Eyes:     General: Lids are normal. No scleral icterus.    Conjunctiva/sclera: Conjunctivae normal.     Pupils: Pupils are equal, round, and reactive to light.  Neck:     Thyroid: No thyromegaly.     Vascular: No carotid bruit or JVD.     Trachea: Trachea normal.  Cardiovascular:     Rate and Rhythm: Normal rate and regular rhythm.     Pulses: Normal pulses.     Heart sounds: Normal heart sounds.  Pulmonary:     Effort: Pulmonary effort is normal. No respiratory distress.     Breath sounds: Normal breath sounds.  Chest:     Chest wall: No tenderness.    Abdominal:     General: There is no distension.     Palpations: Abdomen is soft.     Tenderness: There is no abdominal tenderness. There is no guarding or rebound.  Musculoskeletal:        General: No tenderness. Normal range of motion.     Cervical back: No spinous process tenderness or muscular tenderness.  Lymphadenopathy:     Cervical: No cervical adenopathy.  Skin:    General: Skin is warm and dry.  Neurological:     Mental Status: She is alert and oriented to person, place, and time.     GCS: GCS eye  subscore is 4. GCS verbal subscore is 5. GCS motor subscore is 6.     Cranial Nerves: No cranial nerve deficit.     Sensory: No sensory deficit.  Psychiatric:        Speech: Speech normal.        Behavior: Behavior normal. Behavior is cooperative.    Assessment/Plan 70 year old female status post fall Left 9 through 11 rib fractures Left hemothorax Left chest wall hematoma History of CHF A flutter-on Coumadin Hypertension  1.  At this time would recommend reversal of her INR.  I discussed this with Dr. Laverta [place].  Patient currently receiving vitamin K. 2.  Continue with pain control, will forego left chest tube placement as patient is not hypoxic. 3.  Recommend medical admission secondary to multiple medical issues, history of a flutter, hypertension.  I discussed this with Dr. Laverta [place] who asked hospitalist to admit. 4.  We will continue to follow.  Ralene Ok 09/03/2021, 8:43 PM   Procedures

## 2021-09-03 NOTE — ED Provider Notes (Signed)
Emergency Department Provider Note   I have reviewed the triage vital signs and the nursing notes.   HISTORY  Chief Complaint Fall   HPI Linda Washington is a 70 y.o. female with past history reviewed below including diabetes, hyperlipidemia, a flutter on anticoagulation (coumadin) presents to the emergency department by EMS after a fall.  She is having severe left back/flank pain with large hematoma noted by EMS.  Patient was coming in through her door after getting a package when she slipped and fell landing on her left side against the table.  She denies known head injury or loss of consciousness.  She had no syncope prodrome symptoms prior to falling.  Patient states she sure she slipped and fell.  She has been compliant with her Coumadin.  She has had increasing pain in her left ribs and flank area along with bruising which is worse with touching or movement.  She has some pain with movement of the left leg but overall is able to do so without difficulty.  Also some mild discomfort in the left arm diffusely but most of her pain seems to localize into her back and flank.  She is not having neck pain.  No numbness or unilateral weakness.    Past Medical History:  Diagnosis Date   Acute on chronic diastolic (congestive) heart failure ([place]) 06/2018   Anxiety    takes Citalopram daily   Arthritis    generalized   Basal cell carcinoma    GERD (gastroesophageal reflux disease)    takes Omeprazole daily   History of Bell's palsy    left side   History of bronchitis    History of hiatal hernia    History of kidney stones    Hyperlipemia    takes Simvastatin daily   Hypertension    takes Enalapril daily   Hypothyroidism    Incontinence    takes Ditropan daily   Joint swelling    left knee   Neuropathy    takes Gabapentin daily   PONV (postoperative nausea and vomiting)    RLS (restless legs syndrome)    Seasonal allergies    takes Claritin daioy as needed   Sleep apnea     pt. states that she does not have a CPAP   Tremor    Type 2 diabetes mellitus ([place])    takes Metformin and Amaryl daily    Patient Active Problem List   Diagnosis Date Noted   Abnormal gait 12/25/2020   Acquired hammer toe of left foot 12/25/2020   Acquired hypothyroidism 12/25/2020   Chronic anticoagulation 12/25/2020   Nora Sabey term (current) use of anticoagulants 12/25/2020   Chronic heart failure ([place]) 76/73/4193   Diastolic heart failure ([place]) 12/25/2020   Chronic pain 12/25/2020   Constipation 12/25/2020   Dysphagia 12/25/2020   Esophageal dysmotility 12/25/2020   Gastroesophageal reflux disease 12/25/2020   History of malignant neoplasm of skin 12/25/2020   Lumbosacral spondylosis without myelopathy 12/25/2020   Mixed anxiety and depressive disorder 12/25/2020   Morbid obesity ([place]) 12/25/2020   Overactive bladder 12/25/2020   [place] 12/25/2020   Personal history of colonic polyps 12/25/2020   Polyneuropathy due to type 2 diabetes mellitus ([place]) 12/25/2020   Pure hypercholesterolemia 12/25/2020   Ventral hernia without obstruction or gangrene 12/25/2020   Vitamin D deficiency 12/25/2020   Porokeratosis 09/17/2020   Encounter for therapeutic drug monitoring 04/09/2020   Acute on chronic diastolic CHF (congestive heart failure) ([place]) 06/20/2018   Essential hypertension  06/20/2018   Dyslipidemia 06/20/2018   OSA (obstructive sleep apnea) 06/20/2018   Atrial flutter with controlled response ([place]) 06/20/2018   Right-sided epistaxis 04/28/2018   Unstable angina ([place]) 02/01/2017   Diabetes mellitus ([place]) 02/01/2017   Arthritis of knee 04/08/2015   Primary osteoarthritis of left knee 04/07/2015    Past Surgical History:  Procedure Laterality Date   ACHILLES TENDON REPAIR Right    APPENDECTOMY     BASAL CELL CARCINOMA EXCISION     Nose   BREAST LUMPECTOMY Left    CARDIAC CATHETERIZATION     pt. states approximately 15 years ago   CARDIOVERSION N/A 06/22/2018    Procedure: CARDIOVERSION;  Surgeon: Buford Dresser, MD;  Location: [facility] ENDOSCOPY;  Service: Cardiovascular;  Laterality: N/A;   CARPAL TUNNEL RELEASE Bilateral    DILATION AND CURETTAGE OF UTERUS     EYE SURGERY Bilateral    cataract surgery   KNEE ARTHROSCOPY     Right   LEFT HEART CATH AND CORONARY ANGIOGRAPHY N/A 02/01/2017   Procedure: Left Heart Cath and Coronary Angiography;  Surgeon: Lorretta Harp, MD;  Location: [facility];  Service: Cardiovascular;  Laterality: N/A;   LITHOTRIPSY  2013   TEE WITHOUT CARDIOVERSION N/A 06/22/2018   Procedure: TRANSESOPHAGEAL ECHOCARDIOGRAM (TEE);  Surgeon: Buford Dresser, MD;  Location[facility] ENDOSCOPY;  Service: Cardiovascular;  Laterality: N/A;   TOTAL ABDOMINAL HYSTERECTOMY     TOTAL KNEE ARTHROPLASTY Left 04/08/2015   Procedure: LEFT TOTAL KNEE ARTHROPLASTY;  Surgeon: Frederik Pear, MD;  Location: [facility];  Service: Orthopedics;  Laterality: Left;    Allergies Cyclobenzaprine, Gabapentin, Hydrocodone, Methocarbamol, Oxycodone, Tinidazole, and Latex  Family History  Problem Relation Age of Onset   Heart disease Mother    Heart failure Mother 57   Heart disease Father    Heart failure Father    Heart disease Brother 72   CAD Brother 66    Social History Social History   Tobacco Use   Smoking status: Never   Smokeless tobacco: Never  Vaping Use   Vaping Use: Never used  Substance Use Topics   Alcohol use: Yes    Alcohol/week: 0.0 standard drinks    Comment: Rare-wine   Drug use: No    Review of Systems  Constitutional: No fever/chills Eyes: No visual changes. ENT: No sore throat. Cardiovascular: Denies chest pain. Respiratory: Denies shortness of breath. Gastrointestinal: No abdominal pain.  No nausea, no vomiting.  No diarrhea.  No constipation. Genitourinary: Negative for dysuria. Musculoskeletal: Positive for back pain. Skin: Large left flank/chest wall bruise.  Neurological: Negative for headaches,  focal weakness or numbness.  10-point ROS otherwise negative.  ____________________________________________   PHYSICAL EXAM:  VITAL SIGNS: ED Triage Vitals  Enc Vitals Group     BP 09/03/21 1624 (!) 126/56     Pulse Rate 09/03/21 1624 86     Resp 09/03/21 1624 (!) 29     Temp --      Temp src --      SpO2 09/03/21 1610 97 %     Weight 09/03/21 1612 281 lb 15.5 oz (127.9 kg)     Height 09/03/21 1612 5\' 8"  (1.727 m)   Constitutional: Alert and oriented. Well appearing and in no acute distress. Eyes: Conjunctivae are normal. Head: Atraumatic. Nose: No congestion/rhinnorhea. Mouth/Throat: Mucous membranes are moist.  Neck: No stridor. No cervical spine tenderness to palpation. Cardiovascular: Normal rate, regular rhythm. Good peripheral circulation. Grossly normal heart sounds.   Respiratory: Normal respiratory  effort.  No retractions. Lungs CTAB. Gastrointestinal: Soft and nontender. No distention.  Musculoskeletal: No lower extremity tenderness nor edema. No gross deformities of extremities. Normal ROM of the bilateral upper/lower extremities. No bony tenderness. No wrist/scaphoid tenderness.  Neurologic:  Normal speech and language. No gross focal neurologic deficits are appreciated.  Skin:  Skin is warm and dry. Left flank hematoma (large) with tissue induration and firmness.   ____________________________________________   LABS (all labs ordered are listed, but only abnormal results are displayed)  Labs Reviewed  COMPREHENSIVE METABOLIC PANEL - Abnormal; Notable for the following components:      Result Value   Glucose, Bld 219 (*)    Calcium 8.3 (*)    Total Protein 5.7 (*)    Albumin 2.8 (*)    All other components within normal limits  CBC WITH DIFFERENTIAL/PLATELET - Abnormal; Notable for the following components:   WBC 19.1 (*)    RBC 3.55 (*)    Hemoglobin 10.4 (*)    HCT 32.1 (*)    Neutro Abs 16.6 (*)    Abs Immature Granulocytes 0.16 (*)    All other  components within normal limits  PROTIME-INR - Abnormal; Notable for the following components:   Prothrombin Time 32.4 (*)    INR 3.2 (*)    All other components within normal limits  RESP PANEL BY RT-PCR (FLU A&B, COVID) ARPGX2  HEMOGLOBIN AND HEMATOCRIT, BLOOD  TYPE AND SCREEN   ____________________________________________  EKG   EKG Interpretation  Date/Time:    Ventricular Rate:    PR Interval:    QRS Duration:   QT Interval:    QTC Calculation:   R Axis:     Text Interpretation:          ____________________________________________  RADIOLOGY  CT HEAD WO CONTRAST (5MM)  Result Date: 09/03/2021 CLINICAL DATA:  Head trauma, minor (Age >= 65y) Fall with head and neck pain. EXAM: CT HEAD WITHOUT CONTRAST TECHNIQUE: Contiguous axial images were obtained from the base of the skull through the vertex without intravenous contrast. COMPARISON:  None. FINDINGS: Brain: No intracranial hemorrhage, mass effect, or midline shift. No hydrocephalus. The basilar cisterns are patent. No evidence of territorial infarct or acute ischemia. No extra-axial or intracranial fluid collection. Vascular: No hyperdense vessel or unexpected calcification. Skull: No fracture or focal lesion.  Frontal hyperostosis. Sinuses/Orbits: Paranasal sinuses and mastoid air cells are clear. The visualized orbits are unremarkable. Bilateral cataract resection. Other: None.  No confluent scalp hematoma. IMPRESSION: No acute intracranial abnormality. No skull fracture. Electronically Signed   By: Keith Rake M.D.   On: 09/03/2021 19:34   CT Cervical Spine Wo Contrast  Result Date: 09/03/2021 CLINICAL DATA:  Neck trauma (Age >= 65y) Post fall with head and neck pain. EXAM: CT CERVICAL SPINE WITHOUT CONTRAST TECHNIQUE: Multidetector CT imaging of the cervical spine was performed without intravenous contrast. Multiplanar CT image reconstructions were also generated. COMPARISON:  None. FINDINGS: Alignment: Normal.  Skull base and vertebrae: No acute fracture. Vertebral body heights are maintained. The dens and skull base are intact. Soft tissues and spinal canal: No prevertebral fluid or swelling. No visible canal hematoma. Disc levels: Disc space narrowing and anterior spurring C4-C5 through C6-C7. There is multilevel facet hypertrophy. Upper chest: Left pleural effusion. Assessed on concurrent chest CT, reported separately. Other: None. IMPRESSION: Degenerative change in the cervical spine without acute fracture or subluxation. Electronically Signed   By: Keith Rake M.D.   On: 09/03/2021 19:38   DG  Pelvis Portable  Result Date: 09/03/2021 CLINICAL DATA:  Fall EXAM: PORTABLE PELVIS 1 VIEW COMPARISON:  None. FINDINGS: Evaluation is limited by body habitus and single view. Within this limitation, there is no evidence of pelvic fracture or diastasis. No pelvic bone lesions are seen. IMPRESSION: Negative. Electronically Signed   By: Merilyn Baba M.D.   On: 09/03/2021 17:14   CT CHEST ABDOMEN PELVIS W CONTRAST  Result Date: 09/03/2021 CLINICAL DATA:  Trauma and left flank pain. EXAM: CT CHEST, ABDOMEN, AND PELVIS WITH CONTRAST TECHNIQUE: Multidetector CT imaging of the chest, abdomen and pelvis was performed following the standard protocol during bolus administration of intravenous contrast. CONTRAST:  1107mL OMNIPAQUE IOHEXOL 350 MG/ML SOLN COMPARISON:  CT of the abdomen pelvis dated 02/01/2018. Chest radiograph dated 09/03/2021. Five FINDINGS: CT CHEST FINDINGS Cardiovascular: No cardiomegaly or pericardial effusion. The thoracic aorta is unremarkable. The origins of the great vessels of the aortic arch appear patent as visualized. The central pulmonary arteries are unremarkable. Mediastinum/Nodes: No hilar or mediastinal adenopathy. The esophagus is grossly unremarkable. Heterogeneous thyroid gland with small subcentimeter hypodense nodules as well as foci of calcification. Not clinically significant; no  follow-up imaging recommended (ref: J Am Coll Radiol. 2015 Feb;12(2): 143-50).No mediastinal fluid collection. Lungs/Pleura: Moderate size high attenuating left pleural effusion consistent with hemothorax. There is partial consolidative changes of the left lower lobe. Minimal right lung base atelectasis. Tiny focus of pneumothorax may be present in the anterior left apex (22/5 and sagittal 99/7). No large pneumothorax. The central airways are patent. Musculoskeletal: Displaced fractures of the posterior left eighth, 9, 10, and eleventh ribs. The tenth and eleventh ribs are fractured in 2 places and severely displaced. Clinical correlation is recommended to evaluate for possibility of flail chest. There is inward angulation and displacement of a fracture fragment of the tenth rib abutting the lateral aspect of the spleen. There is a large hematoma in the soft tissues of the left lateral chest wall and left flank measuring up to 8 x 20 cm in greatest axial dimensions and 13 cm in craniocaudal length. Several faint small foci of contrast blush (95/6) noted suggestive of active bleed. CT ABDOMEN PELVIS FINDINGS No intra-abdominal free air or free fluid. Hepatobiliary: Indeterminate 16 mm hypodense lesion in the left lobe of the liver, present on the pelvic CT. No intrahepatic biliary ductal dilatation. The gallbladder is unremarkable. Pancreas: Unremarkable. No pancreatic ductal dilatation or surrounding inflammatory changes. Spleen: Normal in size without focal abnormality. Adrenals/Urinary Tract: The adrenal glands unremarkable. There is no hydronephrosis on either side. Subcentimeter right renal hypodense lesion is not characterized. There is symmetric enhancement and excretion of contrast by both kidneys. The visualized ureters and urinary bladder appear unremarkable. Stomach/Bowel: There is no bowel obstruction or active inflammation. Appendectomy. Vascular/Lymphatic: The abdominal aorta and IVC are unremarkable.  No portal venous gas. There is no adenopathy. Reproductive: Hysterectomy.  No adnexal masses Other: Small fat containing umbilical hernia. Musculoskeletal: Degenerative changes of the spine. No acute osseous pathology. IMPRESSION: 1. Displaced fractures of the posterior left eighth, 9, 10, and eleventh ribs. The tenth and eleventh ribs are fractured in 2 places and severely displaced. 2. Moderate left hemothorax with partial consolidative changes of the left lower lobe. 3. Possible tiny left apical pneumothorax. 4. Large hematoma in the soft tissues of the left lateral chest wall and left flank with evidence of active bleed. 5. No acute intra-abdominal or pelvic pathology. These results were called by telephone at the time of interpretation on 09/03/2021  at 7:42 pm to provider Natausha Jungwirth , who verbally acknowledged these results. Electronically Signed   By: Anner Crete M.D.   On: 09/03/2021 19:46   DG Chest Portable 1 View  Result Date: 09/03/2021 CLINICAL DATA:  Fall. EXAM: PORTABLE CHEST 1 VIEW COMPARISON:  Chest x-ray 06/20/2018. FINDINGS: Heart is mildly enlarged, unchanged. There are minimal interstitial opacities in the left lung base. The lungs are otherwise clear. No pleural effusion or pneumothorax. No acute fractures are seen. IMPRESSION: 1. Cardiomegaly. 2. Minimal interstitial opacities in the left lung base may represent infection or atelectasis. Electronically Signed   By: Ronney Asters M.D.   On: 09/03/2021 17:15    ____________________________________________   PROCEDURES  Procedure(s) performed:   Procedures  CRITICAL CARE Performed by: Margette Fast Total critical care time: 35 minutes Critical care time was exclusive of separately billable procedures and treating other patients. Critical care was necessary to treat or prevent imminent or life-threatening deterioration. Critical care was time spent personally by me on the following activities: development of treatment  plan with patient and/or surrogate as well as nursing, discussions with consultants, evaluation of patient's response to treatment, examination of patient, obtaining history from patient or surrogate, ordering and performing treatments and interventions, ordering and review of laboratory studies, ordering and review of radiographic studies, pulse oximetry and re-evaluation of patient's condition.  Nanda Quinton, MD Emergency Medicine  ____________________________________________   INITIAL IMPRESSION / ASSESSMENT AND PLAN / ED COURSE  Pertinent labs & imaging results that were available during my care of the patient were reviewed by me and considered in my medical decision making (see chart for details).   Patient presents to the ED after a mechanical fall.  She is anticoagulated on Coumadin for a flutter.  She has a large hematoma to the left flank.  The area is focally tender and indurated.  She has normal range of motion of the extremities with no bony tenderness.  No anterior abdominal or chest wall tenderness.  No midline cervical spine tenderness.  The hematoma is tender and causing the patient pain to the point where I am concerned for possible distracting injury.  In this setting, I do plan for CT imaging of the head along with cervical spine.  Will obtain contrast imaging of the chest, abdomen, pelvis.  Will obtain labs including INR. No hypotension or other hemodynamic instability to prompt emergent reversal of her coumadin.   07:45 PM  Spoke with radiology regarding the CT abdomen pelvis imaging.  Patient has displaced rib fractures on the left with moderate sized hemothorax but associated large hematoma to the left flank and left lateral chest.  We will discuss with trauma surgery. Vit K ordered for now. No hemodynamic instability.   Spoke with Dr. Rosendo Gros with trauma. Will give K centra for reversal of coumadin given the CT findings. Plan to repeat H/H and attempt compressive bandage  around the abdomen/chest/flank area as well.   Discussed patient's case with Trauma to request admission. Patient and family (if present) updated with plan. Care transferred to Trauma surgery service.  I reviewed all nursing notes, vitals, pertinent old records, EKGs, labs, imaging (as available).  ____________________________________________  FINAL CLINICAL IMPRESSION(S) / ED DIAGNOSES  Final diagnoses:  Abdominal pain  Closed fracture of multiple ribs of left side, initial encounter  Hemothorax on left  Hematoma and contusion     MEDICATIONS GIVEN DURING THIS VISIT:  Medications  phytonadione (VITAMIN K) 1 mg in dextrose 5 % 50  mL IVPB (1 mg Intravenous New Bag/Given 09/03/21 1955)  prothrombin complex conc human (KCENTRA) IVPB 1,500 Units (has no administration in time range)  morphine 4 MG/ML injection 4 mg (4 mg Intravenous Given 09/03/21 1642)  ondansetron (ZOFRAN) injection 4 mg (4 mg Intravenous Given 09/03/21 1642)  sodium chloride 0.9 % bolus 500 mL (0 mLs Intravenous Stopped 09/03/21 1736)  iohexol (OMNIPAQUE) 350 MG/ML injection 100 mL (100 mLs Intravenous Contrast Given 09/03/21 1920)    Note:  This document was prepared using Dragon voice recognition software and may include unintentional dictation errors.  Nanda Quinton, MD, [facility] Emergency Medicine    Irelynd Zumstein, [facility], MD 09/03/21 2009

## 2021-09-03 NOTE — ED Notes (Signed)
Patient transported to CT 

## 2021-09-03 NOTE — H&P (Addendum)
History and Physical    Linda Washington JKK:[id number] DOB: 07/22/1951 DOA: 09/03/2021  PCP: Marda Stalker, PA-C  Patient coming from: Home  I have personally briefly reviewed patient's old medical records in [place]  Chief Complaint: fall  HPI: Linda Washington is a 70 y.o. female with medical history significant for chronic diastolic heart failure, atrial flutter on Coumadin, OSA not on CPAP, hypertension, type 2 diabetes, GERD, hyperlipidemia, morbid obesity who presents for mechanical fall.  Patient was getting a package at home today and tripped on a rug and fell on her left side onto a table.  She denies any loss of consciousness and did not hit her head.  No dizziness or lightheadedness.  No chest pain or shortness of breath.  Last fall was several months ago which was also mechanical she tripped on a rug.  She lives at home with her son who found her and called EMS.  She is on warfarin for atrial flutter and her last dose was last evening.  ED Course: She had borderline blood pressure of 100/62 and stable on room air.  CBC showed leukocytosis of 19.1, hemoglobin of 10.4.  CT of the chest and abdomen/pelvis shows left hemothorax, tiny pneumothorax of the anterior left apex, there is displaced posterior eighth through 11th rib and large lateral chest M flank hematoma with findings of active bleed. CT head and cervical spine were negative. INR 3.2. ED physician Dr. Laverta Baltimore initially gave 1 mg of vitamin K.  Dr. Rosendo Gros with trauma surgery was then consulted and recommended Kcentra.  Hospitalist on-call for admission.   Review of Systems: Constitutional: No Weight Change, No Fever ENT/Mouth: No sore throat, No Rhinorrhea Eyes: No Eye Pain, No Vision Changes Cardiovascular: No Chest Pain, no SOB Respiratory: No Cough, No Sputum Gastrointestinal: No Nausea, No Vomiting, No Diarrhea,  No Pain Genitourinary: no Urinary Incontinence, No Urgency, No Flank  Pain Musculoskeletal: No Arthralgias, No Myalgias Skin: No Skin Lesions, No Pruritus, Neuro: no Weakness, No Numbness,  No Loss of Consciousness, No Syncope Psych: No Anxiety/Panic, No Depression, no decrease appetite Heme/Lymph: +Bruising, No Bleeding  Past Medical History:  Diagnosis Date   Acute on chronic diastolic (congestive) heart failure (HCC) 06/2018   Anxiety    takes Citalopram daily   Arthritis    generalized   Basal cell carcinoma    GERD (gastroesophageal reflux disease)    takes Omeprazole daily   History of Bell's palsy    left side   History of bronchitis    History of hiatal hernia    History of kidney stones    Hyperlipemia    takes Simvastatin daily   Hypertension    takes Enalapril daily   Hypothyroidism    Incontinence    takes Ditropan daily   Joint swelling    left knee   Neuropathy    takes Gabapentin daily   PONV (postoperative nausea and vomiting)    RLS (restless legs syndrome)    Seasonal allergies    takes Claritin daioy as needed   Sleep apnea    pt. states that she does not have a CPAP   Tremor    Type 2 diabetes mellitus (HCC)    takes Metformin and Amaryl daily    Past Surgical History:  Procedure Laterality Date   ACHILLES TENDON REPAIR Right    APPENDECTOMY     BASAL CELL CARCINOMA EXCISION     Nose   BREAST LUMPECTOMY Left    CARDIAC  CATHETERIZATION     pt. states approximately 15 years ago   CARDIOVERSION N/A 06/22/2018   Procedure: CARDIOVERSION;  Surgeon: Buford Dresser, MD;  Location: [facility] ENDOSCOPY;  Service: Cardiovascular;  Laterality: N/A;   CARPAL TUNNEL RELEASE Bilateral    DILATION AND CURETTAGE OF UTERUS     EYE SURGERY Bilateral    cataract surgery   KNEE ARTHROSCOPY     Right   LEFT HEART CATH AND CORONARY ANGIOGRAPHY N/A 02/01/2017   Procedure: Left Heart Cath and Coronary Angiography;  Surgeon: Lorretta Harp, MD;  Location: [place] [facility];  Service: Cardiovascular;  Laterality: N/A;    LITHOTRIPSY  2013   TEE WITHOUT CARDIOVERSION N/A 06/22/2018   Procedure: TRANSESOPHAGEAL ECHOCARDIOGRAM (TEE);  Surgeon: Buford Dresser, MD;  Location: [facility] ENDOSCOPY;  Service: Cardiovascular;  Laterality: N/A;   TOTAL ABDOMINAL HYSTERECTOMY     TOTAL KNEE ARTHROPLASTY Left 04/08/2015   Procedure: LEFT TOTAL KNEE ARTHROPLASTY;  Surgeon: Frederik Pear, MD;  Location: [place];  Service: Orthopedics;  Laterality: Left;     reports that she has never smoked. She has never used smokeless tobacco. She reports current alcohol use. She reports that she does not use drugs. Social History  Allergies  Allergen Reactions   Cyclobenzaprine Other (See Comments)   Gabapentin Other (See Comments)   Hydrocodone Other (See Comments)    Weird sensations mentally; "makes me fuzzy"   Methocarbamol Hives   Oxycodone Other (See Comments)    Weird sensations mentally; "makes me fuzzy"   Tinidazole     Other reaction(s): hives   Latex Hives and Rash    Family History  Problem Relation Age of Onset   Heart disease Mother    Heart failure Mother 45   Heart disease Father    Heart failure Father    Heart disease Brother 13   CAD Brother 36     Prior to Admission medications   Medication Sig Start Date End Date Taking? Authorizing Provider  acetaminophen (TYLENOL) 650 MG CR tablet Take 650 mg by mouth every 8 (eight) hours as needed for pain.   Yes [provider]  atorvastatin (LIPITOR) 40 MG tablet TAKE 1 TABLET (40 MG TOTAL) BY MOUTH AT BEDTIME. 11/20/20  Yes Jerline Pain, MD  carvedilol (COREG) 3.125 MG tablet TAKE 1 TABLET BY MOUTH 2 TIMES DAILY WITH MEALS 07/16/21  Yes Jerline Pain, MD  cetirizine (ZYRTEC) 10 MG tablet Take 10 mg by mouth daily as needed (for allergies or allergic reactions).    Yes [provider]  citalopram (CELEXA) 20 MG tablet Take 20 mg by mouth daily.   Yes [provider]  enalapril (VASOTEC) 10 MG tablet Take 10 mg by mouth at bedtime.    Yes  [provider]  fluticasone (FLONASE) 50 MCG/ACT nasal spray Place 1-2 sprays into both nostrils daily as needed for allergies or rhinitis.   Yes [provider]  furosemide (LASIX) 40 MG tablet Take 1 tablet (40 mg total) by mouth daily. 07/16/21  Yes Jerline Pain, MD  metFORMIN (GLUCOPHAGE) 1000 MG tablet Take 1,000 mg by mouth 2 (two) times daily with a meal.   Yes [provider]  NOVOLOG FLEXPEN 100 UNIT/ML FlexPen Inject 4 Units into the skin daily as needed. If Blood Sugar>200=Take 4 units 05/26/21  Yes [provider]  omeprazole (PRILOSEC) 20 MG capsule Take 20 mg by mouth daily.   Yes [provider]  oxybutynin (DITROPAN XL) 15 MG 24  hr tablet Take 15 mg by mouth daily.   Yes [provider]  Polyethyl Glycol-Propyl Glycol (SYSTANE OP) Place 1-2 drops into both eyes daily as needed (dry eyes).   Yes [provider]  TRULICITY 9.79 GX/2.1JH SOPN Inject 0.75 mg into the skin once a week. Take on Wed 05/29/20  Yes [provider]  warfarin (COUMADIN) 5 MG tablet TAKE AS DIRECTED BY COUMADIN CLINIC Patient taking differently: Take 7.5-10 mg by mouth as directed. Take 1.5 tablets (7.5 mg) on MWF & Take 2 tablets (10 mg) all other days AS DIRECTED BY COUMADIN CLINIC 07/16/21  Yes Jerline Pain, MD    Physical Exam: Vitals:   09/03/21 2015 09/03/21 2030 09/03/21 2045 09/03/21 2100  BP: 112/62 (!) 106/59 (!) 108/55 (!) 116/54  Pulse: 87 89 89 88  Resp: 14 17 18  (!) 22  SpO2: 95% 96% 96% 96%  Weight:      Height:        Constitutional: NAD, calm, comfortable, elderly female appearing younger than stated age laying flat in bed Vitals:   09/03/21 2015 09/03/21 2030 09/03/21 2045 09/03/21 2100  BP: 112/62 (!) 106/59 (!) 108/55 (!) 116/54  Pulse: 87 89 89 88  Resp: 14 17 18  (!) 22  SpO2: 95% 96% 96% 96%  Weight:      Height:       Eyes: PERRL, lids and conjunctivae normal ENMT: Mucous membranes are moist.  Neck:  normal, supple Respiratory: clear to auscultation bilaterally, no wheezing, no crackles. Normal respiratory effort. No accessory muscle use.  Cardiovascular: Regular rate and rhythm, no murmurs / rubs / gallops. No extremity edema.  Abdomen: no tenderness, no masses palpated.  Bowel sounds positive.  Large indurated area of ecchymosis to left flank area on lateral view.  Patient unable to sit up for exam due to intolerance to pain. Musculoskeletal: no clubbing / cyanosis. No joint deformity upper and lower extremities. Good ROM, no contractures. Normal muscle tone.  Endorse pain with palpation of the left posterior shoulder. Skin: no rashes, lesions, ulcers. No induration Neurologic: CN 2-12 grossly intact. Sensation intact, Strength 5/5 in all 4.  Psychiatric: Normal judgment and insight. Alert and oriented x 3. Normal mood.     Labs on Admission: I have personally reviewed following labs and imaging studies  CBC: Recent Labs  Lab 09/03/21 1632  WBC 19.1*  NEUTROABS 16.6*  HGB 10.4*  HCT 32.1*  MCV 90.4  PLT 417   Basic Metabolic Panel: Recent Labs  Lab 09/03/21 1632  NA 138  K 4.1  CL 104  CO2 22  GLUCOSE 219*  BUN 15  CREATININE 0.87  CALCIUM 8.3*   GFR: Estimated Creatinine Clearance: 82.6 mL/min (by C-G formula based on SCr of 0.87 mg/dL). Liver Function Tests: Recent Labs  Lab 09/03/21 1632  AST 19  ALT 14  ALKPHOS 63  BILITOT 0.9  PROT 5.7*  ALBUMIN 2.8*   No results for input(s): LIPASE, AMYLASE in the last 168 hours. No results for input(s): AMMONIA in the last 168 hours. Coagulation Profile: Recent Labs  Lab 09/03/21 1632  INR 3.2*   Cardiac Enzymes: No results for input(s): CKTOTAL, CKMB, CKMBINDEX, TROPONINI in the last 168 hours. BNP (last 3 results) No results for input(s): PROBNP in the last 8760 hours. HbA1C: No results for input(s): HGBA1C in the last 72 hours. CBG: No results for input(s): GLUCAP in the last 168 hours. Lipid  Profile: No results for input(s): CHOL, HDL, LDLCALC,  TRIG, CHOLHDL, LDLDIRECT in the last 72 hours. Thyroid Function Tests: No results for input(s): TSH, T4TOTAL, FREET4, T3FREE, THYROIDAB in the last 72 hours. Anemia Panel: No results for input(s): VITAMINB12, FOLATE, FERRITIN, TIBC, IRON, RETICCTPCT in the last 72 hours. Urine analysis:    Component Value Date/Time   COLORURINE YELLOW 03/28/2015 1404   APPEARANCEUR CLEAR 03/28/2015 1404   LABSPEC 1.024 03/28/2015 1404   PHURINE 5.0 03/28/2015 1404   GLUCOSEU NEGATIVE 03/28/2015 1404   HGBUR TRACE (A) 03/28/2015 1404   BILIRUBINUR NEGATIVE 03/28/2015 1404   KETONESUR NEGATIVE 03/28/2015 1404   PROTEINUR NEGATIVE 03/28/2015 1404   UROBILINOGEN 0.2 03/28/2015 1404   NITRITE NEGATIVE 03/28/2015 1404   LEUKOCYTESUR NEGATIVE 03/28/2015 1404    Radiological Exams on Admission: CT HEAD WO CONTRAST (5MM)  Result Date: 09/03/2021 CLINICAL DATA:  Head trauma, minor (Age >= 65y) Fall with head and neck pain. EXAM: CT HEAD WITHOUT CONTRAST TECHNIQUE: Contiguous axial images were obtained from the base of the skull through the vertex without intravenous contrast. COMPARISON:  None. FINDINGS: Brain: No intracranial hemorrhage, mass effect, or midline shift. No hydrocephalus. The basilar cisterns are patent. No evidence of territorial infarct or acute ischemia. No extra-axial or intracranial fluid collection. Vascular: No hyperdense vessel or unexpected calcification. Skull: No fracture or focal lesion.  Frontal hyperostosis. Sinuses/Orbits: Paranasal sinuses and mastoid air cells are clear. The visualized orbits are unremarkable. Bilateral cataract resection. Other: None.  No confluent scalp hematoma. IMPRESSION: No acute intracranial abnormality. No skull fracture. Electronically Signed   By: Keith Rake M.D.   On: 09/03/2021 19:34   CT Cervical Spine Wo Contrast  Result Date: 09/03/2021 CLINICAL DATA:  Neck trauma (Age >= 65y) Post fall  with head and neck pain. EXAM: CT CERVICAL SPINE WITHOUT CONTRAST TECHNIQUE: Multidetector CT imaging of the cervical spine was performed without intravenous contrast. Multiplanar CT image reconstructions were also generated. COMPARISON:  None. FINDINGS: Alignment: Normal. Skull base and vertebrae: No acute fracture. Vertebral body heights are maintained. The dens and skull base are intact. Soft tissues and spinal canal: No prevertebral fluid or swelling. No visible canal hematoma. Disc levels: Disc space narrowing and anterior spurring C4-C5 through C6-C7. There is multilevel facet hypertrophy. Upper chest: Left pleural effusion. Assessed on concurrent chest CT, reported separately. Other: None. IMPRESSION: Degenerative change in the cervical spine without acute fracture or subluxation. Electronically Signed   By: Keith Rake M.D.   On: 09/03/2021 19:38   DG Pelvis Portable  Result Date: 09/03/2021 CLINICAL DATA:  Fall EXAM: PORTABLE PELVIS 1 VIEW COMPARISON:  None. FINDINGS: Evaluation is limited by body habitus and single view. Within this limitation, there is no evidence of pelvic fracture or diastasis. No pelvic bone lesions are seen. IMPRESSION: Negative. Electronically Signed   By: Merilyn Baba M.D.   On: 09/03/2021 17:14   CT CHEST ABDOMEN PELVIS W CONTRAST  Result Date: 09/03/2021 CLINICAL DATA:  Trauma and left flank pain. EXAM: CT CHEST, ABDOMEN, AND PELVIS WITH CONTRAST TECHNIQUE: Multidetector CT imaging of the chest, abdomen and pelvis was performed following the standard protocol during bolus administration of intravenous contrast. CONTRAST:  124mL OMNIPAQUE IOHEXOL 350 MG/ML SOLN COMPARISON:  CT of the abdomen pelvis dated 02/01/2018. Chest radiograph dated 09/03/2021. Five FINDINGS: CT CHEST FINDINGS Cardiovascular: No cardiomegaly or pericardial effusion. The thoracic aorta is unremarkable. The origins of the great vessels of the aortic arch appear patent as visualized. The central  pulmonary arteries are unremarkable. Mediastinum/Nodes: No hilar or mediastinal adenopathy.  The esophagus is grossly unremarkable. Heterogeneous thyroid gland with small subcentimeter hypodense nodules as well as foci of calcification. Not clinically significant; no follow-up imaging recommended (ref: J Am Coll Radiol. 2015 Feb;12(2): 143-50).No mediastinal fluid collection. Lungs/Pleura: Moderate size high attenuating left pleural effusion consistent with hemothorax. There is partial consolidative changes of the left lower lobe. Minimal right lung base atelectasis. Tiny focus of pneumothorax may be present in the anterior left apex (22/5 and sagittal 99/7). No large pneumothorax. The central airways are patent. Musculoskeletal: Displaced fractures of the posterior left eighth, 9, 10, and eleventh ribs. The tenth and eleventh ribs are fractured in 2 places and severely displaced. Clinical correlation is recommended to evaluate for possibility of flail chest. There is inward angulation and displacement of a fracture fragment of the tenth rib abutting the lateral aspect of the spleen. There is a large hematoma in the soft tissues of the left lateral chest wall and left flank measuring up to 8 x 20 cm in greatest axial dimensions and 13 cm in craniocaudal length. Several faint small foci of contrast blush (95/6) noted suggestive of active bleed. CT ABDOMEN PELVIS FINDINGS No intra-abdominal free air or free fluid. Hepatobiliary: Indeterminate 16 mm hypodense lesion in the left lobe of the liver, present on the pelvic CT. No intrahepatic biliary ductal dilatation. The gallbladder is unremarkable. Pancreas: Unremarkable. No pancreatic ductal dilatation or surrounding inflammatory changes. Spleen: Normal in size without focal abnormality. Adrenals/Urinary Tract: The adrenal glands unremarkable. There is no hydronephrosis on either side. Subcentimeter right renal hypodense lesion is not characterized. There is symmetric  enhancement and excretion of contrast by both kidneys. The visualized ureters and urinary bladder appear unremarkable. Stomach/Bowel: There is no bowel obstruction or active inflammation. Appendectomy. Vascular/Lymphatic: The abdominal aorta and IVC are unremarkable. No portal venous gas. There is no adenopathy. Reproductive: Hysterectomy.  No adnexal masses Other: Small fat containing umbilical hernia. Musculoskeletal: Degenerative changes of the spine. No acute osseous pathology. IMPRESSION: 1. Displaced fractures of the posterior left eighth, 9, 10, and eleventh ribs. The tenth and eleventh ribs are fractured in 2 places and severely displaced. 2. Moderate left hemothorax with partial consolidative changes of the left lower lobe. 3. Possible tiny left apical pneumothorax. 4. Large hematoma in the soft tissues of the left lateral chest wall and left flank with evidence of active bleed. 5. No acute intra-abdominal or pelvic pathology. These results were called by telephone at the time of interpretation on 09/03/2021 at 7:42 pm to provider JOSHUA LONG , who verbally acknowledged these results. Electronically Signed   By: Anner Crete M.D.   On: 09/03/2021 19:46   DG Chest Portable 1 View  Result Date: 09/03/2021 CLINICAL DATA:  Fall. EXAM: PORTABLE CHEST 1 VIEW COMPARISON:  Chest x-ray 06/20/2018. FINDINGS: Heart is mildly enlarged, unchanged. There are minimal interstitial opacities in the left lung base. The lungs are otherwise clear. No pleural effusion or pneumothorax. No acute fractures are seen. IMPRESSION: 1. Cardiomegaly. 2. Minimal interstitial opacities in the left lung base may represent infection or atelectasis. Electronically Signed   By: Ronney Asters M.D.   On: 09/03/2021 17:15      Assessment/Plan  Mechanical fall on anticoagulation Posterior displaced 8th,9th,10,11th ribs Left hemothorax Tiny anterior left apex pneumothorax Left lateral chest and flank hematoma On warfarin for  atrial flutter with INR of 3.2 on admission -1 mg of vitamin K and Kcentra given by ED physician -Trauma surgery has been consulted- recommended forgoing chest tube since she  is stable on room air  -Follow repeat H&H -Also endorse pain with left shoulder although she has some rotator cuff issues being followed up outpatient. Has pain with lifting left lower extremity. Will obtain left shoulder and left hip X-ray. -PRN morphine 2mg  q3hr for moderate pain and IV dilaudid 1mg  q4hr for severe pain  -incentive spirometry when awake   Borderline hypotension - Hold ARB and Lasix for now. Continue Coreg  Atrial flutter - On warfarin, last dose on 11/15 -Hold Coumadin with large hematoma  Chronic diastolic heart failure - Compensated.  Hold Lasix and ARB for now given borderline blood pressure and large hematoma  Type 2 diabetes -place on sensitive SSI   OSA Not on CPAP  DVT prophylaxis:.SCDs Code Status: Full Family Communication: Plan discussed with patient and sister-in-law at bedside  disposition Plan: Home with observation Consults called: Trauma surgery Admission status: Observation   Level of care: Progressive  Status is: Observation  The patient remains OBS appropriate and will d/c before 2 midnights.        Orene Desanctis DO [facility]   If 7PM-7AM, please contact night-coverage [email or web address]   09/03/2021, 9:12 PM

## 2021-09-03 NOTE — TOC CAGE-AID Note (Signed)
Transition of Care [facility]) - CAGE-AID Screening   Patient Details  Name: Linda Washington MRN: [id number] Date of Birth: [birth year]  [facility] of [facility]) CM/SW Contact:    Army Melia, RN Phone [phone] 09/03/2021, 9:42 PM   Clinical Narrative:  Patient admitted to hospital with left hemothorax and left rib 9-11 fractures as a result of a ground level fall. Anticoagulated with warfarin.  CAGE-AID Screening:    Have You Ever Felt You Ought to Cut Down on Your Drinking or Drug Use?: No Have People Annoyed You By Critizing Your Drinking Or Drug Use?: No Have You Felt Bad Or Guilty About Your Drinking Or Drug Use?: No Have You Ever Had a Drink or Used Drugs First Thing In The Morning to Steady Your Nerves or to Get Rid of a Hangover?: No CAGE-AID Score: 0  Substance Abuse Education Offered: No (no history of substance abuse, no need for resources at this time)

## 2021-09-03 NOTE — ED Notes (Signed)
MD at bedside. 

## 2021-09-04 ENCOUNTER — Observation Stay (HOSPITAL_COMMUNITY): Payer: Medicare Other

## 2021-09-04 DIAGNOSIS — Z87442 Personal history of urinary calculi: Secondary | ICD-10-CM | POA: Diagnosis not present

## 2021-09-04 DIAGNOSIS — Z20822 Contact with and (suspected) exposure to covid-19: Secondary | ICD-10-CM | POA: Diagnosis present

## 2021-09-04 DIAGNOSIS — D6832 Hemorrhagic disorder due to extrinsic circulating anticoagulants: Secondary | ICD-10-CM | POA: Diagnosis present

## 2021-09-04 DIAGNOSIS — Z91199 Patient's noncompliance with other medical treatment and regimen due to unspecified reason: Secondary | ICD-10-CM | POA: Diagnosis not present

## 2021-09-04 DIAGNOSIS — Z6841 Body Mass Index (BMI) 40.0 and over, adult: Secondary | ICD-10-CM | POA: Diagnosis not present

## 2021-09-04 DIAGNOSIS — J9601 Acute respiratory failure with hypoxia: Secondary | ICD-10-CM | POA: Diagnosis not present

## 2021-09-04 DIAGNOSIS — I517 Cardiomegaly: Secondary | ICD-10-CM | POA: Diagnosis not present

## 2021-09-04 DIAGNOSIS — I110 Hypertensive heart disease with heart failure: Secondary | ICD-10-CM | POA: Diagnosis present

## 2021-09-04 DIAGNOSIS — D62 Acute posthemorrhagic anemia: Secondary | ICD-10-CM | POA: Diagnosis present

## 2021-09-04 DIAGNOSIS — J90 Pleural effusion, not elsewhere classified: Secondary | ICD-10-CM | POA: Diagnosis not present

## 2021-09-04 DIAGNOSIS — G4733 Obstructive sleep apnea (adult) (pediatric): Secondary | ICD-10-CM | POA: Diagnosis present

## 2021-09-04 DIAGNOSIS — R0781 Pleurodynia: Secondary | ICD-10-CM | POA: Diagnosis not present

## 2021-09-04 DIAGNOSIS — Z7901 Long term (current) use of anticoagulants: Secondary | ICD-10-CM | POA: Diagnosis not present

## 2021-09-04 DIAGNOSIS — T45515A Adverse effect of anticoagulants, initial encounter: Secondary | ICD-10-CM | POA: Diagnosis present

## 2021-09-04 DIAGNOSIS — J942 Hemothorax: Secondary | ICD-10-CM | POA: Diagnosis present

## 2021-09-04 DIAGNOSIS — Z888 Allergy status to other drugs, medicaments and biological substances status: Secondary | ICD-10-CM | POA: Diagnosis not present

## 2021-09-04 DIAGNOSIS — I482 Chronic atrial fibrillation, unspecified: Secondary | ICD-10-CM | POA: Diagnosis present

## 2021-09-04 DIAGNOSIS — Z96652 Presence of left artificial knee joint: Secondary | ICD-10-CM | POA: Diagnosis present

## 2021-09-04 DIAGNOSIS — E785 Hyperlipidemia, unspecified: Secondary | ICD-10-CM | POA: Diagnosis present

## 2021-09-04 DIAGNOSIS — I959 Hypotension, unspecified: Secondary | ICD-10-CM | POA: Diagnosis present

## 2021-09-04 DIAGNOSIS — I5032 Chronic diastolic (congestive) heart failure: Secondary | ICD-10-CM | POA: Diagnosis present

## 2021-09-04 DIAGNOSIS — Z885 Allergy status to narcotic agent status: Secondary | ICD-10-CM | POA: Diagnosis not present

## 2021-09-04 DIAGNOSIS — S271XXA Traumatic hemothorax, initial encounter: Secondary | ICD-10-CM | POA: Diagnosis present

## 2021-09-04 DIAGNOSIS — W1809XA Striking against other object with subsequent fall, initial encounter: Secondary | ICD-10-CM | POA: Diagnosis present

## 2021-09-04 DIAGNOSIS — I4892 Unspecified atrial flutter: Secondary | ICD-10-CM | POA: Diagnosis present

## 2021-09-04 DIAGNOSIS — S301XXA Contusion of abdominal wall, initial encounter: Secondary | ICD-10-CM | POA: Diagnosis present

## 2021-09-04 DIAGNOSIS — S2242XA Multiple fractures of ribs, left side, initial encounter for closed fracture: Secondary | ICD-10-CM | POA: Diagnosis present

## 2021-09-04 DIAGNOSIS — E1165 Type 2 diabetes mellitus with hyperglycemia: Secondary | ICD-10-CM | POA: Diagnosis present

## 2021-09-04 LAB — CBC
HCT: 28.5 % — ABNORMAL LOW (ref 36.0–46.0)
Hemoglobin: 9 g/dL — ABNORMAL LOW (ref 12.0–15.0)
MCH: 28.7 pg (ref 26.0–34.0)
MCHC: 31.6 g/dL (ref 30.0–36.0)
MCV: 90.8 fL (ref 80.0–100.0)
Platelets: 333 10*3/uL (ref 150–400)
RBC: 3.14 MIL/uL — ABNORMAL LOW (ref 3.87–5.11)
RDW: 14.7 % (ref 11.5–15.5)
WBC: 14.5 10*3/uL — ABNORMAL HIGH (ref 4.0–10.5)
nRBC: 0 % (ref 0.0–0.2)

## 2021-09-04 LAB — GLUCOSE, CAPILLARY
Glucose-Capillary: 298 mg/dL — ABNORMAL HIGH (ref 70–99)
Glucose-Capillary: 278 mg/dL — ABNORMAL HIGH (ref 70–99)
Glucose-Capillary: 347 mg/dL — ABNORMAL HIGH (ref 70–99)
Glucose-Capillary: 261 mg/dL — ABNORMAL HIGH (ref 70–99)
Glucose-Capillary: 182 mg/dL — ABNORMAL HIGH (ref 70–99)

## 2021-09-04 LAB — PROTIME-INR
INR: 1.2 (ref 0.8–1.2)
Prothrombin Time: 15.4 seconds — ABNORMAL HIGH (ref 11.4–15.2)

## 2021-09-04 LAB — MRSA NEXT GEN BY PCR, NASAL: MRSA by PCR Next Gen: NOT DETECTED

## 2021-09-04 MED ORDER — INSULIN ASPART 100 UNIT/ML IJ SOLN
0.0000 [IU] | Freq: Three times a day (TID) | INTRAMUSCULAR | Status: DC
  Administered 2021-09-04: 11:00:00 11 [IU] via SUBCUTANEOUS
  Administered 2021-09-04: 17:00:00 8 [IU] via SUBCUTANEOUS
  Administered 2021-09-05: 5 [IU] via SUBCUTANEOUS
  Administered 2021-09-05: 3 [IU] via SUBCUTANEOUS
  Administered 2021-09-05: 5 [IU] via SUBCUTANEOUS
  Administered 2021-09-06: 3 [IU] via SUBCUTANEOUS
  Administered 2021-09-06: 5 [IU] via SUBCUTANEOUS
  Administered 2021-09-06: 2 [IU] via SUBCUTANEOUS
  Administered 2021-09-07 (×3): 3 [IU] via SUBCUTANEOUS
  Administered 2021-09-08: 2 [IU] via SUBCUTANEOUS

## 2021-09-04 MED ORDER — ONDANSETRON HCL 4 MG/2ML IJ SOLN
4.0000 mg | Freq: Four times a day (QID) | INTRAMUSCULAR | Status: DC | PRN
  Administered 2021-09-04: 14:00:00 4 mg via INTRAVENOUS
  Filled 2021-09-04: qty 2

## 2021-09-04 MED ORDER — INSULIN DETEMIR 100 UNIT/ML ~~LOC~~ SOLN
5.0000 [IU] | Freq: Two times a day (BID) | SUBCUTANEOUS | Status: DC
  Administered 2021-09-04 (×2): 5 [IU] via SUBCUTANEOUS
  Filled 2021-09-04 (×4): qty 0.05

## 2021-09-04 MED ORDER — MORPHINE SULFATE (PF) 2 MG/ML IV SOLN: 2.0000 mg | INTRAVENOUS | Status: DC | PRN

## 2021-09-04 MED ORDER — TRAMADOL HCL 50 MG PO TABS
50.0000 mg | ORAL_TABLET | Freq: Four times a day (QID) | ORAL | Status: DC | PRN
Start: 2021-09-04 — End: 2021-09-08
  Administered 2021-09-04: 15:00:00 100 mg via ORAL
  Administered 2021-09-04: 09:00:00 50 mg via ORAL
  Administered 2021-09-05 (×2): 100 mg via ORAL
  Administered 2021-09-07 – 2021-09-08 (×2): 50 mg via ORAL
  Filled 2021-09-04: qty 2
  Filled 2021-09-04 (×2): qty 1
  Filled 2021-09-04: qty 2
  Filled 2021-09-04: qty 1
  Filled 2021-09-04: qty 2

## 2021-09-04 MED ORDER — POLYETHYLENE GLYCOL 3350 17 G PO PACK
17.0000 g | PACK | Freq: Two times a day (BID) | ORAL | Status: AC
  Administered 2021-09-04 – 2021-09-05 (×3): 17 g via ORAL
  Filled 2021-09-04 (×4): qty 1

## 2021-09-04 MED ORDER — INSULIN ASPART 100 UNIT/ML IJ SOLN
0.0000 [IU] | Freq: Every day | INTRAMUSCULAR | Status: DC
  Administered 2021-09-06: 2 [IU] via SUBCUTANEOUS

## 2021-09-04 MED ORDER — METAXALONE 800 MG PO TABS
400.0000 mg | ORAL_TABLET | Freq: Three times a day (TID) | ORAL | Status: DC
  Administered 2021-09-04 – 2021-09-08 (×13): 400 mg via ORAL
  Filled 2021-09-04 (×4): qty 0.5
  Filled 2021-09-04: qty 1
  Filled 2021-09-04 (×7): qty 0.5
  Filled 2021-09-04: qty 1
  Filled 2021-09-04 (×4): qty 0.5

## 2021-09-04 MED ORDER — INSULIN ASPART 100 UNIT/ML IJ SOLN
3.0000 [IU] | Freq: Three times a day (TID) | INTRAMUSCULAR | Status: DC
  Administered 2021-09-04 – 2021-09-08 (×12): 3 [IU] via SUBCUTANEOUS

## 2021-09-04 MED ORDER — ACETAMINOPHEN 500 MG PO TABS
1000.0000 mg | ORAL_TABLET | Freq: Four times a day (QID) | ORAL | Status: DC
  Administered 2021-09-04 – 2021-09-08 (×15): 1000 mg via ORAL
  Filled 2021-09-04 (×15): qty 2

## 2021-09-04 NOTE — Evaluation (Signed)
Physical Therapy Evaluation Patient Details Name: Linda Washington MRN: [id number] DOB: 27-May-1951 Today's Date: 09/04/2021  History of Present Illness  70 y/o female presented to ED on 11/16 following a fall with severe L back/flank pain with large hematoma. CT head negative. CT chest/abdomen showed displaced fx of posterior L 8-11 ribs, moderate L hemothorax with partial consolidative changes of L lower lobe, possibly tiny L apical pneumothorax, large hematoma in soft tissues of L lateral chest wall and L flank. PMH: diabetes, HLD, Atrial flutter on Coumadin, HTN, CHF, neuropathy, sleep apnea  Clinical Impression  Patient admitted with above diagnosis. Patient presents with weakness, impaired balance, decreased activity tolerance, and pain. Patient requires minA for bed mobility and sit to stand transfer. Ambulated 10' with [place]. Encouraged use of RW for in room mobility with nursing staff. Instructed patient on use of pillow for abdominal bracing when coughing. Encouraged use of incentive spirometer to prevent pneumonia. Patient will benefit from skilled PT services during acute stay to address listed deficits. Recommend HHPT at discharge to maximize functional independence and reduce fall risk in the home. Patient reports son has removed rugs in the house to prevent future falls.        Recommendations for follow up therapy are one component of a multi-disciplinary discharge planning process, led by the attending physician.  Recommendations may be updated based on patient status, additional functional criteria and insurance authorization.  Follow Up Recommendations Home health PT    Assistance Recommended at Discharge Intermittent Supervision/Assistance  Functional Status Assessment Patient has had a recent decline in their functional status and demonstrates the ability to make significant improvements in function in a reasonable and predictable amount of time.  Equipment  Recommendations  Rolling [name] (2 wheels)    Recommendations for Other Services OT consult     Precautions / Restrictions Precautions Precautions: Fall Precaution Comments: watch O2 Restrictions Weight Bearing Restrictions: No      Mobility  Bed Mobility Overal bed mobility: Needs Assistance Bed Mobility: Rolling;Sidelying to Sit Rolling: Min assist Sidelying to sit: Min assist       General bed mobility comments: Educated on log roll technique with good follow through. minA to roll towards L to simulate home set up. MinA for trunk elevation. Use of bed rails and increased time to complete    Transfers Overall transfer level: Needs assistance Equipment used: None Transfers: Sit to/from Stand Sit to Stand: Min assist           General transfer comment: minA to rise and steady from low bed surface    Ambulation/Gait Ambulation/Gait assistance: Min assist Gait Distance (Feet): 10 Feet Assistive device: 1 person hand held assist Gait Pattern/deviations: Step-to pattern;Decreased stride length;Wide base of support Gait velocity: decreased     General Gait Details: increased pain with mobility. MinA for balance with HHAx1. Encouraged use of RW for support due to pain. Patient with reports of nausea following ambulation  Stairs            Wheelchair Mobility    Modified Rankin (Stroke Patients Only)       Balance Overall balance assessment: Needs assistance Sitting-balance support: No upper extremity supported;Feet supported Sitting balance-Leahy Scale: Fair     Standing balance support: Single extremity supported;During functional activity Standing balance-Leahy Scale: Poor Standing balance comment: requires minA for balance. Will benefit from RW for support  Pertinent Vitals/Pain Pain Assessment: 0-10 Pain Score: 7  Pain Location: L flank Pain Descriptors / Indicators: Grimacing;Guarding Pain  Intervention(s): Monitored during session;Repositioned;Limited activity within patient's tolerance    Home Living Family/patient expects to be discharged to:: Private residence Living Arrangements: Children Available Help at Discharge: Family;Available 24 hours/day Type of Home: House Home Access: Stairs to enter Entrance Stairs-Rails: Left Entrance Stairs-Number of Steps: 5   Home Layout: One level Home Equipment: Cane - single point;Grab bars - tub/shower      Prior Function Prior Level of Function : Independent/Modified Independent;Driving;History of Falls (last six months)             Mobility Comments: uses SPC for community mobility and occasionally in the home. Reports 2-3 falls in past 6 months       Hand Dominance        Extremity/Trunk Assessment   Upper Extremity Assessment Upper Extremity Assessment: Generalized weakness    Lower Extremity Assessment Lower Extremity Assessment: Generalized weakness (due to pain)    Cervical / Trunk Assessment Cervical / Trunk Assessment: Kyphotic  Communication   Communication: No difficulties  Cognition Arousal/Alertness: Awake/alert Behavior During Therapy: WFL for tasks assessed/performed Overall Cognitive Status: Within Functional Limits for tasks assessed                                          General Comments General comments (skin integrity, edema, etc.): on 1.5 L O2 on arrival with spO2 96%. Removed for mobility with spO2 >90% throughout.    Exercises     Assessment/Plan    PT Assessment Patient needs continued PT services  PT Problem List Decreased strength;Decreased activity tolerance;Decreased balance;Decreased mobility;Decreased knowledge of use of DME;Cardiopulmonary status limiting activity;Pain       PT Treatment Interventions DME instruction;Gait training;Stair training;Functional mobility training;Therapeutic activities;Therapeutic exercise;Balance training;Patient/family  education    PT Goals (Current goals can be found in the Care Plan section)  Acute Rehab PT Goals Patient Stated Goal: to reduce pain PT Goal Formulation: With patient Time For Goal Achievement: 09/18/21 Potential to Achieve Goals: Good    Frequency Min 3X/week   Barriers to discharge        Co-evaluation               AM-PAC PT "6 Clicks" Mobility  Outcome Measure Help needed turning from your back to your side while in a flat bed without using bedrails?: A Little Help needed moving from lying on your back to sitting on the side of a flat bed without using bedrails?: A Little Help needed moving to and from a bed to a chair (including a wheelchair)?: A Little Help needed standing up from a chair using your arms (e.g., wheelchair or bedside chair)?: A Little Help needed to walk in hospital room?: A Little Help needed climbing 3-5 steps with a railing? : A Little 6 Click Score: 18    End of Session   Activity Tolerance: Patient limited by pain Patient left: in chair;with call bell/phone within reach;with nursing/sitter in room Nurse Communication: Mobility status PT Visit Diagnosis: Unsteadiness on feet (R26.81);Muscle weakness (generalized) (M62.81);History of falling (Z91.81)    Time: 5093-2671 PT Time Calculation (min) (ACUTE ONLY): 33 min   Charges:   PT Evaluation $PT Eval Moderate Complexity: 1 Mod PT Treatments $Therapeutic Activity: 8-22 mins        Tallin Hart A. Gilford Rile, PT,  DPT Acute Rehabilitation Services Pager [phone] Office [phone]   Linna Hoff 09/04/2021, 9:19 AM

## 2021-09-04 NOTE — Progress Notes (Signed)
Subjective: Patient with pain as expected on left side.  Denies SOB.  On 2L sating in mid 90s.  Denies any other complaints.  ROS: See above, otherwise other systems negative  Objective: Vital signs in last 24 hours: Temp:  [97.8 F (36.6 C)-98.5 F (36.9 C)] 98.5 F (36.9 C) (11/17 0348) Pulse Rate:  [75-100] 91 (11/17 0348) Resp:  [12-29] 17 (11/17 0348) BP: (98-131)/(53-83) 123/69 (11/17 0348) SpO2:  [91 %-99 %] 95 % (11/17 0348) Weight:  [127.9 kg] 127.9 kg (11/16 1612) Last BM Date:  (PTA)  Intake/Output from previous day: 11/16 0701 - 11/17 0700 In: 750 [P.O.:700; IV Piggyback:50] Out: -  Intake/Output this shift: No intake/output data recorded.  PE: Gen: NAD HEENT: PERRL Heart: irregular, but rate controlled Lungs: CTAB, but slightly decreased at left base.  Small ecchymosis over superior aspect of L breast, but moderate hematoma noted over L lower chest flank area Abd: soft, NT, obese, abdominal binder in place Ext: MAE Neuro: NVI Psych: A&Ox3  Lab Results:  Recent Labs    09/03/21 1632 09/03/21 2103 09/04/21 0245  WBC 19.1*  --  14.5*  HGB 10.4* 9.8* 9.0*  HCT 32.1* 30.3* 28.5*  PLT 334  --  333   BMET Recent Labs    09/03/21 1632  NA 138  K 4.1  CL 104  CO2 22  GLUCOSE 219*  BUN 15  CREATININE 0.87  CALCIUM 8.3*   PT/INR Recent Labs    09/03/21 1632 09/04/21 0245  LABPROT 32.4* 15.4*  INR 3.2* 1.2   CMP     Component Value Date/Time   NA 138 09/03/2021 1632   NA 141 10/31/2019 1653   K 4.1 09/03/2021 1632   CL 104 09/03/2021 1632   CO2 22 09/03/2021 1632   GLUCOSE 219 (H) 09/03/2021 1632   BUN 15 09/03/2021 1632   BUN 19 10/31/2019 1653   CREATININE 0.87 09/03/2021 1632   CALCIUM 8.3 (L) 09/03/2021 1632   PROT 5.7 (L) 09/03/2021 1632   PROT 6.7 10/31/2019 1653   ALBUMIN 2.8 (L) 09/03/2021 1632   ALBUMIN 3.9 10/31/2019 1653   AST 19 09/03/2021 1632   ALT 14 09/03/2021 1632   ALKPHOS 63 09/03/2021 1632   BILITOT  0.9 09/03/2021 1632   BILITOT 0.5 10/31/2019 1653   GFRNONAA >60 09/03/2021 1632   GFRAA 59 (L) 10/31/2019 1653   Lipase  No results found for: LIPASE     Studies/Results: CT HEAD WO CONTRAST (5MM)  Result Date: 09/03/2021 CLINICAL DATA:  Head trauma, minor (Age >= 65y) Fall with head and neck pain. EXAM: CT HEAD WITHOUT CONTRAST TECHNIQUE: Contiguous axial images were obtained from the base of the skull through the vertex without intravenous contrast. COMPARISON:  None. FINDINGS: Brain: No intracranial hemorrhage, mass effect, or midline shift. No hydrocephalus. The basilar cisterns are patent. No evidence of territorial infarct or acute ischemia. No extra-axial or intracranial fluid collection. Vascular: No hyperdense vessel or unexpected calcification. Skull: No fracture or focal lesion.  Frontal hyperostosis. Sinuses/Orbits: Paranasal sinuses and mastoid air cells are clear. The visualized orbits are unremarkable. Bilateral cataract resection. Other: None.  No confluent scalp hematoma. IMPRESSION: No acute intracranial abnormality. No skull fracture. Electronically Signed   By: Keith Rake M.D.   On: 09/03/2021 19:34   CT Cervical Spine Wo Contrast  Result Date: 09/03/2021 CLINICAL DATA:  Neck trauma (Age >= 65y) Post fall with head and neck pain. EXAM: CT CERVICAL SPINE WITHOUT  CONTRAST TECHNIQUE: Multidetector CT imaging of the cervical spine was performed without intravenous contrast. Multiplanar CT image reconstructions were also generated. COMPARISON:  None. FINDINGS: Alignment: Normal. Skull base and vertebrae: No acute fracture. Vertebral body heights are maintained. The dens and skull base are intact. Soft tissues and spinal canal: No prevertebral fluid or swelling. No visible canal hematoma. Disc levels: Disc space narrowing and anterior spurring C4-C5 through C6-C7. There is multilevel facet hypertrophy. Upper chest: Left pleural effusion. Assessed on concurrent chest CT,  reported separately. Other: None. IMPRESSION: Degenerative change in the cervical spine without acute fracture or subluxation. Electronically Signed   By: Keith Rake M.D.   On: 09/03/2021 19:38   DG Pelvis Portable  Result Date: 09/03/2021 CLINICAL DATA:  Fall EXAM: PORTABLE PELVIS 1 VIEW COMPARISON:  None. FINDINGS: Evaluation is limited by body habitus and single view. Within this limitation, there is no evidence of pelvic fracture or diastasis. No pelvic bone lesions are seen. IMPRESSION: Negative. Electronically Signed   By: Merilyn Baba M.D.   On: 09/03/2021 17:14   CT CHEST ABDOMEN PELVIS W CONTRAST  Result Date: 09/03/2021 CLINICAL DATA:  Trauma and left flank pain. EXAM: CT CHEST, ABDOMEN, AND PELVIS WITH CONTRAST TECHNIQUE: Multidetector CT imaging of the chest, abdomen and pelvis was performed following the standard protocol during bolus administration of intravenous contrast. CONTRAST:  173mL OMNIPAQUE IOHEXOL 350 MG/ML SOLN COMPARISON:  CT of the abdomen pelvis dated 02/01/2018. Chest radiograph dated 09/03/2021. Five FINDINGS: CT CHEST FINDINGS Cardiovascular: No cardiomegaly or pericardial effusion. The thoracic aorta is unremarkable. The origins of the great vessels of the aortic arch appear patent as visualized. The central pulmonary arteries are unremarkable. Mediastinum/Nodes: No hilar or mediastinal adenopathy. The esophagus is grossly unremarkable. Heterogeneous thyroid gland with small subcentimeter hypodense nodules as well as foci of calcification. Not clinically significant; no follow-up imaging recommended (ref: J Am Coll Radiol. 2015 Feb;12(2): 143-50).No mediastinal fluid collection. Lungs/Pleura: Moderate size high attenuating left pleural effusion consistent with hemothorax. There is partial consolidative changes of the left lower lobe. Minimal right lung base atelectasis. Tiny focus of pneumothorax may be present in the anterior left apex (22/5 and sagittal 99/7). No  large pneumothorax. The central airways are patent. Musculoskeletal: Displaced fractures of the posterior left eighth, 9, 10, and eleventh ribs. The tenth and eleventh ribs are fractured in 2 places and severely displaced. Clinical correlation is recommended to evaluate for possibility of flail chest. There is inward angulation and displacement of a fracture fragment of the tenth rib abutting the lateral aspect of the spleen. There is a large hematoma in the soft tissues of the left lateral chest wall and left flank measuring up to 8 x 20 cm in greatest axial dimensions and 13 cm in craniocaudal length. Several faint small foci of contrast blush (95/6) noted suggestive of active bleed. CT ABDOMEN PELVIS FINDINGS No intra-abdominal free air or free fluid. Hepatobiliary: Indeterminate 16 mm hypodense lesion in the left lobe of the liver, present on the pelvic CT. No intrahepatic biliary ductal dilatation. The gallbladder is unremarkable. Pancreas: Unremarkable. No pancreatic ductal dilatation or surrounding inflammatory changes. Spleen: Normal in size without focal abnormality. Adrenals/Urinary Tract: The adrenal glands unremarkable. There is no hydronephrosis on either side. Subcentimeter right renal hypodense lesion is not characterized. There is symmetric enhancement and excretion of contrast by both kidneys. The visualized ureters and urinary bladder appear unremarkable. Stomach/Bowel: There is no bowel obstruction or active inflammation. Appendectomy. Vascular/Lymphatic: The abdominal aorta and IVC  are unremarkable. No portal venous gas. There is no adenopathy. Reproductive: Hysterectomy.  No adnexal masses Other: Small fat containing umbilical hernia. Musculoskeletal: Degenerative changes of the spine. No acute osseous pathology. IMPRESSION: 1. Displaced fractures of the posterior left eighth, 9, 10, and eleventh ribs. The tenth and eleventh ribs are fractured in 2 places and severely displaced. 2. Moderate left  hemothorax with partial consolidative changes of the left lower lobe. 3. Possible tiny left apical pneumothorax. 4. Large hematoma in the soft tissues of the left lateral chest wall and left flank with evidence of active bleed. 5. No acute intra-abdominal or pelvic pathology. These results were called by telephone at the time of interpretation on 09/03/2021 at 7:42 pm to provider JOSHUA LONG , who verbally acknowledged these results. Electronically Signed   By: Anner Crete M.D.   On: 09/03/2021 19:46   DG CHEST PORT 1 VIEW  Result Date: 09/04/2021 CLINICAL DATA:  Fall, left rib pain, hemothorax EXAM: PORTABLE CHEST 1 VIEW COMPARISON:  09/03/2021 FINDINGS: No significant change in AP portable chest radiograph. Cardiomegaly with a small left pleural effusion. No focal airspace opacity. Known left rib fractures are not clearly appreciated radiographically. IMPRESSION: 1. No significant change in AP portable chest radiograph. Cardiomegaly with a small left pleural effusion. 2. Known left rib fractures are not clearly appreciated radiographically. Electronically Signed   By: Delanna Ahmadi M.D.   On: 09/04/2021 08:01   DG Chest Portable 1 View  Result Date: 09/03/2021 CLINICAL DATA:  Fall. EXAM: PORTABLE CHEST 1 VIEW COMPARISON:  Chest x-ray 06/20/2018. FINDINGS: Heart is mildly enlarged, unchanged. There are minimal interstitial opacities in the left lung base. The lungs are otherwise clear. No pleural effusion or pneumothorax. No acute fractures are seen. IMPRESSION: 1. Cardiomegaly. 2. Minimal interstitial opacities in the left lung base may represent infection or atelectasis. Electronically Signed   By: Ronney Asters M.D.   On: 09/03/2021 17:15   DG Shoulder Left  Result Date: 09/03/2021 CLINICAL DATA:  Fall, pain EXAM: LEFT SHOULDER - 2+ VIEW COMPARISON:  None. FINDINGS: Degenerative changes in the left [facility] and glenohumeral joints with joint space narrowing and spurring, most pronounced in the [facility]  joint. No acute bony abnormality. Specifically, no fracture, subluxation, or dislocation. IMPRESSION: Degenerative changes.  No acute bony abnormality. Electronically Signed   By: Rolm Baptise M.D.   On: 09/03/2021 22:06   DG HIP PORT UNILAT WITH PELVIS 1V LEFT  Result Date: 09/03/2021 CLINICAL DATA:  Fall, pain EXAM: DG HIP (WITH OR WITHOUT PELVIS) 1V PORT LEFT COMPARISON:  None. FINDINGS: Limited single view study. No visible fracture, subluxation or dislocation. Mild spurring in the left hip joint. IMPRESSION: Limited study.  No visible fracture Electronically Signed   By: Rolm Baptise M.D.   On: 09/03/2021 22:05    Anti-infectives: Anti-infectives (From admission, onward)    None        Assessment/Plan Fall Left 9 through 11 rib fractures - multi-modal pain control, therapies, IS, pulling between [phone].  mobilize Left hemothorax - stable on CXR.  Repeat in am and if ok no further imaging needed Left chest wall hematoma - stable, hgb has stabilized out.  Binder prn.  This will resolve on its own in time. History of CHF - per medicine A flutter-on Coumadin - INR down to 1.2 today.  Will need to hold coumadin for 2 weeks and then can resume Hypertension - per medicine ABL anemia - stabilizing out.  10.4 to 9.  Follow  Trauma surgery will continue to follow   LOS: 0 days    Henreitta Cea , [facility] Surgery 09/04/2021, 8:45 AM Please see Amion for pager number during day hours 7:00am-4:30pm or 7:00am -11:30am on weekends

## 2021-09-04 NOTE — Progress Notes (Signed)
Inpatient Diabetes Program Recommendations  AACE/ADA: New Consensus Statement on Inpatient Glycemic Control (2015)  Target Ranges:  Prepandial:   less than 140 mg/dL      Peak postprandial:   less than 180 mg/dL (1-2 hours)      Critically ill patients:  140 - 180 mg/dL   Lab Results  Component Value Date   GLUCAP 347 (H) 09/04/2021   HGBA1C 7.7 (H) 09/03/2021    Review of Glycemic Control  Latest Reference Range & Units 09/03/21 23:07 09/04/21 02:01 09/04/21 11:07  Glucose-Capillary 70 - 99 mg/dL 298 (H) 278 (H) 347 (H)   Diabetes history: DM 2 Outpatient Diabetes medications:  Metformin 1000 mg bid, Novolog 4 units if blood sugar >742 mg/dL, Trulicity 5.95 mg weekly Current orders for Inpatient glycemic control:  Novolog moderate tid with meals and meals Levemir 5 units bid Novolog 3 units tid with meals (hold if patient eats less than 50% or NPO)  Inpatient Diabetes Program Recommendations:    Please consider increasing Levemir to 12 units bid.    Thanks,  Adah Perl, RN, BC-ADM Inpatient Diabetes Coordinator Pager [phone]  (8a-5p)

## 2021-09-04 NOTE — Progress Notes (Signed)
[facility] PROGRESS NOTE    Progress Note  Linda Washington  RXV:[id number] DOB: Nov 25, 1950 DOA: 09/03/2021 PCP: Marda Stalker, PA-C     Brief Narrative:   Linda Washington is an 70 y.o. female past medical history significant for chronic diastolic heart failure, atrial fibrillation on Coumadin, struct of sleep apnea not on CPAP diabetes mellitus type 2 morbid obesity who presents after a fall, there was no loss of consciousness during tripped over a rug and fell on her left side, in the ED she had a white count of 19 hemoglobin of 10 (back in 2021 her hemoglobin was 12.4) CT of the chest abdomen and pelvis showed a left ninth 10th and 11th rib fracture the 10th and 11 ribs are displaced in 2 places, there is left effusion (probably hemothorax), tiny left apical pneumothorax and a large left hematoma of the soft tissues of the lateral chest wall, she was given vitamin K in the ED trauma surgery was consulted with and recommended Kcentra    Assessment/Plan:   Left Hemothorax with left ninth, 10th and 11th rib fracture: Status post vitamin K and Kcentra, INR this morning was 1.2. Trauma surgery was consulted and recommended no chest tube. CT imaging of the C-spine and head showed no acute findings, hip x-ray showed no fracture, left shoulder x-ray no acute findings. Continue narcotics for pain. Recommend incentive spirometry and flutter valve. Out of bed to chair consult physical therapy.  Acute respiratory failure with hypoxia: Currently requiring 2 L of oxygen continue symptom spirometry, flutter about out of bed to chair. Try to wean to room air.  Hypotension: Hold ARB and Lasix, continue Coreg. Blood pressures remained stable continue to monitor.  atrial fibrillation rate controlled:  Holding Coumadin continue Coreg for rate control. INR this morning is 1.2.  Chronic diastolic heart failure: Appears to be compensated holding Lasix and ARB due to borderline blood  pressure and hematoma.  Normocytic anemia acute blood loss anemia: Likely due to left hemothorax and large left-sided hematoma  Diabetes mellitus type 2: A1c of 7.7, start long-acting insulin change sliding scale insulin.   DVT prophylaxis: SCD Family Communication:none Status is: Observation  The patient will require care spanning > 2 midnights and should be moved to inpatient because:       Code Status:     Code Status Orders  (From admission, onward)           Start     Ordered   09/03/21 2108  Full code  Continuous        09/03/21 2109           Code Status History     Date Active Date Inactive Code Status Order ID Comments User Context   06/20/2018 1521 06/23/2018 1651 DNR [id number]  Karmen Bongo, MD ED   02/01/2017 0842 02/01/2017 2301 Full Code [id number]  Ahmed Prima Fransisco Hertz, PA-C ED   04/08/2015 1218 04/10/2015 1848 Full Code [id number]  Frederik Pear, MD Inpatient         IV Access:   Peripheral IV   Procedures and diagnostic studies:   CT HEAD WO CONTRAST (5MM)  Result Date: 09/03/2021 CLINICAL DATA:  Head trauma, minor (Age >= 65y) Fall with head and neck pain. EXAM: CT HEAD WITHOUT CONTRAST TECHNIQUE: Contiguous axial images were obtained from the base of the skull through the vertex without intravenous contrast. COMPARISON:  None. FINDINGS: Brain: No intracranial hemorrhage, mass effect, or midline shift. No hydrocephalus. The basilar cisterns are  patent. No evidence of territorial infarct or acute ischemia. No extra-axial or intracranial fluid collection. Vascular: No hyperdense vessel or unexpected calcification. Skull: No fracture or focal lesion.  Frontal hyperostosis. Sinuses/Orbits: Paranasal sinuses and mastoid air cells are clear. The visualized orbits are unremarkable. Bilateral cataract resection. Other: None.  No confluent scalp hematoma. IMPRESSION: No acute intracranial abnormality. No skull fracture. Electronically Signed   By: Keith Rake M.D.   On: 09/03/2021 19:34   CT Cervical Spine Wo Contrast  Result Date: 09/03/2021 CLINICAL DATA:  Neck trauma (Age >= 65y) Post fall with head and neck pain. EXAM: CT CERVICAL SPINE WITHOUT CONTRAST TECHNIQUE: Multidetector CT imaging of the cervical spine was performed without intravenous contrast. Multiplanar CT image reconstructions were also generated. COMPARISON:  None. FINDINGS: Alignment: Normal. Skull base and vertebrae: No acute fracture. Vertebral body heights are maintained. The dens and skull base are intact. Soft tissues and spinal canal: No prevertebral fluid or swelling. No visible canal hematoma. Disc levels: Disc space narrowing and anterior spurring C4-C5 through C6-C7. There is multilevel facet hypertrophy. Upper chest: Left pleural effusion. Assessed on concurrent chest CT, reported separately. Other: None. IMPRESSION: Degenerative change in the cervical spine without acute fracture or subluxation. Electronically Signed   By: Keith Rake M.D.   On: 09/03/2021 19:38   DG Pelvis Portable  Result Date: 09/03/2021 CLINICAL DATA:  Fall EXAM: PORTABLE PELVIS 1 VIEW COMPARISON:  None. FINDINGS: Evaluation is limited by body habitus and single view. Within this limitation, there is no evidence of pelvic fracture or diastasis. No pelvic bone lesions are seen. IMPRESSION: Negative. Electronically Signed   By: Merilyn Baba M.D.   On: 09/03/2021 17:14   CT CHEST ABDOMEN PELVIS W CONTRAST  Result Date: 09/03/2021 CLINICAL DATA:  Trauma and left flank pain. EXAM: CT CHEST, ABDOMEN, AND PELVIS WITH CONTRAST TECHNIQUE: Multidetector CT imaging of the chest, abdomen and pelvis was performed following the standard protocol during bolus administration of intravenous contrast. CONTRAST:  157mL OMNIPAQUE IOHEXOL 350 MG/ML SOLN COMPARISON:  CT of the abdomen pelvis dated 02/01/2018. Chest radiograph dated 09/03/2021. Five FINDINGS: CT CHEST FINDINGS Cardiovascular: No cardiomegaly or  pericardial effusion. The thoracic aorta is unremarkable. The origins of the great vessels of the aortic arch appear patent as visualized. The central pulmonary arteries are unremarkable. Mediastinum/Nodes: No hilar or mediastinal adenopathy. The esophagus is grossly unremarkable. Heterogeneous thyroid gland with small subcentimeter hypodense nodules as well as foci of calcification. Not clinically significant; no follow-up imaging recommended (ref: J Am Coll Radiol. 2015 Feb;12(2): 143-50).No mediastinal fluid collection. Lungs/Pleura: Moderate size high attenuating left pleural effusion consistent with hemothorax. There is partial consolidative changes of the left lower lobe. Minimal right lung base atelectasis. Tiny focus of pneumothorax may be present in the anterior left apex (22/5 and sagittal 99/7). No large pneumothorax. The central airways are patent. Musculoskeletal: Displaced fractures of the posterior left eighth, 9, 10, and eleventh ribs. The tenth and eleventh ribs are fractured in 2 places and severely displaced. Clinical correlation is recommended to evaluate for possibility of flail chest. There is inward angulation and displacement of a fracture fragment of the tenth rib abutting the lateral aspect of the spleen. There is a large hematoma in the soft tissues of the left lateral chest wall and left flank measuring up to 8 x 20 cm in greatest axial dimensions and 13 cm in craniocaudal length. Several faint small foci of contrast blush (95/6) noted suggestive of active bleed. CT ABDOMEN PELVIS  FINDINGS No intra-abdominal free air or free fluid. Hepatobiliary: Indeterminate 16 mm hypodense lesion in the left lobe of the liver, present on the pelvic CT. No intrahepatic biliary ductal dilatation. The gallbladder is unremarkable. Pancreas: Unremarkable. No pancreatic ductal dilatation or surrounding inflammatory changes. Spleen: Normal in size without focal abnormality. Adrenals/Urinary Tract: The adrenal  glands unremarkable. There is no hydronephrosis on either side. Subcentimeter right renal hypodense lesion is not characterized. There is symmetric enhancement and excretion of contrast by both kidneys. The visualized ureters and urinary bladder appear unremarkable. Stomach/Bowel: There is no bowel obstruction or active inflammation. Appendectomy. Vascular/Lymphatic: The abdominal aorta and IVC are unremarkable. No portal venous gas. There is no adenopathy. Reproductive: Hysterectomy.  No adnexal masses Other: Small fat containing umbilical hernia. Musculoskeletal: Degenerative changes of the spine. No acute osseous pathology. IMPRESSION: 1. Displaced fractures of the posterior left eighth, 9, 10, and eleventh ribs. The tenth and eleventh ribs are fractured in 2 places and severely displaced. 2. Moderate left hemothorax with partial consolidative changes of the left lower lobe. 3. Possible tiny left apical pneumothorax. 4. Large hematoma in the soft tissues of the left lateral chest wall and left flank with evidence of active bleed. 5. No acute intra-abdominal or pelvic pathology. These results were called by telephone at the time of interpretation on 09/03/2021 at 7:42 pm to provider JOSHUA LONG , who verbally acknowledged these results. Electronically Signed   By: Anner Crete M.D.   On: 09/03/2021 19:46   DG Chest Portable 1 View  Result Date: 09/03/2021 CLINICAL DATA:  Fall. EXAM: PORTABLE CHEST 1 VIEW COMPARISON:  Chest x-ray 06/20/2018. FINDINGS: Heart is mildly enlarged, unchanged. There are minimal interstitial opacities in the left lung base. The lungs are otherwise clear. No pleural effusion or pneumothorax. No acute fractures are seen. IMPRESSION: 1. Cardiomegaly. 2. Minimal interstitial opacities in the left lung base may represent infection or atelectasis. Electronically Signed   By: Ronney Asters M.D.   On: 09/03/2021 17:15   DG Shoulder Left  Result Date: 09/03/2021 CLINICAL DATA:  Fall,  pain EXAM: LEFT SHOULDER - 2+ VIEW COMPARISON:  None. FINDINGS: Degenerative changes in the left [facility] and glenohumeral joints with joint space narrowing and spurring, most pronounced in the [facility] joint. No acute bony abnormality. Specifically, no fracture, subluxation, or dislocation. IMPRESSION: Degenerative changes.  No acute bony abnormality. Electronically Signed   By: Rolm Baptise M.D.   On: 09/03/2021 22:06   DG HIP PORT UNILAT WITH PELVIS 1V LEFT  Result Date: 09/03/2021 CLINICAL DATA:  Fall, pain EXAM: DG HIP (WITH OR WITHOUT PELVIS) 1V PORT LEFT COMPARISON:  None. FINDINGS: Limited single view study. No visible fracture, subluxation or dislocation. Mild spurring in the left hip joint. IMPRESSION: Limited study.  No visible fracture Electronically Signed   By: Rolm Baptise M.D.   On: 09/03/2021 22:05     Medical Consultants:   None.   Subjective:    Linda Washington relates her pain is controlled.  Objective:    Vitals:   09/03/21 2202 09/03/21 2212 09/04/21 0019 09/04/21 0348  BP: 117/83  131/62 123/69  Pulse: 88  85 91  Resp: 19  20 17   Temp: 98 F (36.7 C)  97.8 F (36.6 C) 98.5 F (36.9 C)  TempSrc: Oral  Oral Oral  SpO2: 96% 93% 96% 95%  Weight:      Height:       SpO2: 95 % O2 Flow Rate (L/min): 2 L/min  Intake/Output Summary (Last 24 hours) at 09/04/2021 0715 Last data filed at 09/04/2021 0300 Gross per 24 hour  Intake 750 ml  Output --  Net 750 ml   Filed Weights   09/03/21 1612  Weight: 127.9 kg    Exam: General exam: In no acute distress. Respiratory system: Good air movement and clear to auscultation. Cardiovascular system: S1 & S2 heard, RRR. No JVD. Gastrointestinal system: Abdomen is nondistended, soft and nontender.  Extremities: No pedal edema. Skin: No rashes, lesions or ulcers Psychiatry: Judgement and insight appear normal. Mood & affect appropriate.    Data Reviewed:    Labs: Basic Metabolic Panel: Recent Labs  Lab  09/03/21 1632  NA 138  K 4.1  CL 104  CO2 22  GLUCOSE 219*  BUN 15  CREATININE 0.87  CALCIUM 8.3*   GFR Estimated Creatinine Clearance: 82.6 mL/min (by C-G formula based on SCr of 0.87 mg/dL). Liver Function Tests: Recent Labs  Lab 09/03/21 1632  AST 19  ALT 14  ALKPHOS 63  BILITOT 0.9  PROT 5.7*  ALBUMIN 2.8*   No results for input(s): LIPASE, AMYLASE in the last 168 hours. No results for input(s): AMMONIA in the last 168 hours. Coagulation profile Recent Labs  Lab 09/03/21 1632 09/04/21 0245  INR 3.2* 1.2   COVID-19 Labs  No results for input(s): DDIMER, FERRITIN, LDH, CRP in the last 72 hours.  Lab Results  Component Value Date   [place] NEGATIVE 09/03/2021    CBC: Recent Labs  Lab 09/03/21 1632 09/03/21 2103 09/04/21 0245  WBC 19.1*  --  14.5*  NEUTROABS 16.6*  --   --   HGB 10.4* 9.8* 9.0*  HCT 32.1* 30.3* 28.5*  MCV 90.4  --  90.8  PLT 334  --  333   Cardiac Enzymes: No results for input(s): CKTOTAL, CKMB, CKMBINDEX, TROPONINI in the last 168 hours. BNP (last 3 results) No results for input(s): PROBNP in the last 8760 hours. CBG: Recent Labs  Lab 09/03/21 2307 09/04/21 0201  GLUCAP 298* 278*   D-Dimer: No results for input(s): DDIMER in the last 72 hours. Hgb A1c: Recent Labs    09/03/21 2124  HGBA1C 7.7*   Lipid Profile: No results for input(s): CHOL, HDL, LDLCALC, TRIG, CHOLHDL, LDLDIRECT in the last 72 hours. Thyroid function studies: No results for input(s): TSH, T4TOTAL, T3FREE, THYROIDAB in the last 72 hours.  Invalid input(s): FREET3 Anemia work up: No results for input(s): VITAMINB12, FOLATE, FERRITIN, TIBC, IRON, RETICCTPCT in the last 72 hours. Sepsis Labs: Recent Labs  Lab 09/03/21 1632 09/04/21 0245  WBC 19.1* 14.5*   Microbiology Recent Results (from the past 240 hour(s))  Resp Panel by RT-PCR (Flu A&B, Covid) Nasopharyngeal Swab     Status: None   Collection Time: 09/03/21  7:47 PM   Specimen:  Nasopharyngeal Swab; Nasopharyngeal(NP) swabs in vial transport medium  Result Value Ref Range Status   SARS Coronavirus 2 by RT PCR NEGATIVE NEGATIVE Final    Comment: (NOTE) SARS-CoV-2 target nucleic acids are NOT DETECTED.  The SARS-CoV-2 RNA is generally detectable in upper respiratory specimens during the acute phase of infection. The lowest concentration of SARS-CoV-2 viral copies this assay can detect is 138 copies/mL. A negative result does not preclude SARS-Cov-2 infection and should not be used as the sole basis for treatment or other patient management decisions. A negative result may occur with  improper specimen collection/handling, submission of specimen other than nasopharyngeal swab, presence of viral mutation(s) within the areas  targeted by this assay, and inadequate number of viral copies(<138 copies/mL). A negative result must be combined with clinical observations, patient history, and epidemiological information. The expected result is Negative.  Fact Sheet for Patients:  EntrepreneurPulse.com.au  Fact Sheet for Healthcare Providers:  IncredibleEmployment.be  This test is no t yet approved or cleared by the [place] FDA and  has been authorized for detection and/or diagnosis of SARS-CoV-2 by FDA under an Emergency Use Authorization (EUA). This EUA will remain  in effect (meaning this test can be used) for the duration of the COVID-19 declaration under Section 564(b)(1) of the Act, 21 U.S.C.section 360bbb-3(b)(1), unless the authorization is terminated  or revoked sooner.       Influenza A by PCR NEGATIVE NEGATIVE Final   Influenza B by PCR NEGATIVE NEGATIVE Final    Comment: (NOTE) The Xpert Xpress SARS-CoV-2/FLU/RSV plus assay is intended as an aid in the diagnosis of influenza from Nasopharyngeal swab specimens and should not be used as a sole basis for treatment. Nasal washings and aspirates are unacceptable for  Xpert Xpress SARS-CoV-2/FLU/RSV testing.  Fact Sheet for Patients: EntrepreneurPulse.com.au  Fact Sheet for Healthcare Providers: IncredibleEmployment.be  This test is not yet approved or cleared by the [place] FDA and has been authorized for detection and/or diagnosis of SARS-CoV-2 by FDA under an Emergency Use Authorization (EUA). This EUA will remain in effect (meaning this test can be used) for the duration of the COVID-19 declaration under Section 564(b)(1) of the Act, 21 U.S.C. section 360bbb-3(b)(1), unless the authorization is terminated or revoked.  Performed at [facility], [place] [address] [place], [place] [address]   MRSA Next Gen by PCR, Nasal     Status: None   Collection Time: 09/03/21 10:04 PM   Specimen: Nasal Mucosa; Nasal Swab  Result Value Ref Range Status   MRSA by PCR Next Gen NOT DETECTED NOT DETECTED Final    Comment: (NOTE) The GeneXpert MRSA Assay (FDA approved for NASAL specimens only), is one component of a comprehensive MRSA colonization surveillance program. It is not intended to diagnose MRSA infection nor to guide or monitor treatment for MRSA infections. Test performance is not FDA approved in patients less than 38 years old. Performed at [facility], [place] [address], [place], [place] [address]      Medications:    atorvastatin  40 mg Oral QHS   carvedilol  3.125 mg Oral BID WC   citalopram  20 mg Oral Daily   fentaNYL (SUBLIMAZE) injection  25 mcg Intravenous Once   insulin aspart  0-9 Units Subcutaneous TID PC,HS,0200   oxybutynin  15 mg Oral Daily   pantoprazole  40 mg Oral Daily   Continuous Infusions:    LOS: 0 days   Charlynne Cousins  [facility]  09/04/2021, 7:15 AM

## 2021-09-05 ENCOUNTER — Inpatient Hospital Stay (HOSPITAL_COMMUNITY): Payer: Medicare Other

## 2021-09-05 DIAGNOSIS — G4733 Obstructive sleep apnea (adult) (pediatric): Secondary | ICD-10-CM | POA: Diagnosis not present

## 2021-09-05 DIAGNOSIS — I4892 Unspecified atrial flutter: Secondary | ICD-10-CM | POA: Diagnosis not present

## 2021-09-05 DIAGNOSIS — I5032 Chronic diastolic (congestive) heart failure: Secondary | ICD-10-CM | POA: Diagnosis not present

## 2021-09-05 DIAGNOSIS — J942 Hemothorax: Secondary | ICD-10-CM | POA: Diagnosis not present

## 2021-09-05 LAB — CBC WITH DIFFERENTIAL/PLATELET
Abs Immature Granulocytes: 0.16 10*3/uL — ABNORMAL HIGH (ref 0.00–0.07)
Basophils Absolute: 0 10*3/uL (ref 0.0–0.1)
Basophils Relative: 0 %
Eosinophils Absolute: 0 10*3/uL (ref 0.0–0.5)
Eosinophils Relative: 0 %
HCT: 23.4 % — ABNORMAL LOW (ref 36.0–46.0)
Hemoglobin: 7.7 g/dL — ABNORMAL LOW (ref 12.0–15.0)
Immature Granulocytes: 1 %
Lymphocytes Relative: 12 %
Lymphs Abs: 2.2 10*3/uL (ref 0.7–4.0)
MCH: 29.5 pg (ref 26.0–34.0)
MCHC: 32.9 g/dL (ref 30.0–36.0)
MCV: 89.7 fL (ref 80.0–100.0)
Monocytes Absolute: 1.8 10*3/uL — ABNORMAL HIGH (ref 0.1–1.0)
Monocytes Relative: 10 %
Neutro Abs: 13.6 10*3/uL — ABNORMAL HIGH (ref 1.7–7.7)
Neutrophils Relative %: 77 %
Platelets: 305 10*3/uL (ref 150–400)
RBC: 2.61 MIL/uL — ABNORMAL LOW (ref 3.87–5.11)
RDW: 14.7 % (ref 11.5–15.5)
WBC: 17.8 10*3/uL — ABNORMAL HIGH (ref 4.0–10.5)
nRBC: 0 % (ref 0.0–0.2)

## 2021-09-05 LAB — GLUCOSE, CAPILLARY
Glucose-Capillary: 202 mg/dL — ABNORMAL HIGH (ref 70–99)
Glucose-Capillary: 213 mg/dL — ABNORMAL HIGH (ref 70–99)
Glucose-Capillary: 218 mg/dL — ABNORMAL HIGH (ref 70–99)
Glucose-Capillary: 189 mg/dL — ABNORMAL HIGH (ref 70–99)
Glucose-Capillary: 182 mg/dL — ABNORMAL HIGH (ref 70–99)
Glucose-Capillary: 163 mg/dL — ABNORMAL HIGH (ref 70–99)

## 2021-09-05 MED ORDER — INSULIN DETEMIR 100 UNIT/ML ~~LOC~~ SOLN
10.0000 [IU] | Freq: Two times a day (BID) | SUBCUTANEOUS | Status: DC
  Administered 2021-09-05 – 2021-09-08 (×7): 10 [IU] via SUBCUTANEOUS
  Filled 2021-09-05 (×8): qty 0.1

## 2021-09-05 MED ORDER — FUROSEMIDE 40 MG PO TABS
40.0000 mg | ORAL_TABLET | Freq: Every day | ORAL | Status: DC
  Administered 2021-09-05 – 2021-09-08 (×4): 40 mg via ORAL
  Filled 2021-09-05 (×4): qty 1

## 2021-09-05 NOTE — Progress Notes (Signed)
Physical Therapy Treatment Patient Details Name: Linda Washington MRN: [id number] DOB: [birth year] Today's Date: 09/05/2021   History of Present Illness 70 y/o female presented to ED on 11/16 following a fall with severe L back/flank pain with large hematoma. CT head negative. CT chest/abdomen showed displaced fx of posterior L 8-11 ribs, moderate L hemothorax with partial consolidative changes of L lower lobe, possibly tiny L apical pneumothorax, large hematoma in soft tissues of L lateral chest wall and L flank. PMH: diabetes, HLD, Atrial flutter on Coumadin, HTN, CHF, neuropathy, sleep apnea    PT Comments    Patient able to progress ambulation distance this session to 39' with use of RW and min guard. Patient continues to be limited by decreased activity tolerance and pain. Patient with 2/4 DOE during session but VSS on RA. Next session to focus on stair training for preparing for discharge home. Continue to recommend HHPT at discharge to maximize functional independence and safety.     Recommendations for follow up therapy are one component of a multi-disciplinary discharge planning process, led by the attending physician.  Recommendations may be updated based on patient status, additional functional criteria and insurance authorization.  Follow Up Recommendations  Home health PT     Assistance Recommended at Discharge Intermittent Supervision/Assistance  Equipment Recommendations  Rolling Manasvini Whatley (2 wheels)    Recommendations for Other Services       Precautions / Restrictions Precautions Precautions: Fall Precaution Comments: watch O2     Mobility  Bed Mobility Overal bed mobility: Needs Assistance Bed Mobility: Rolling;Sidelying to Sit Rolling: Supervision Sidelying to sit: Supervision       General bed mobility comments: increased time required to complete and use of bed rails    Transfers Overall transfer level: Needs assistance Equipment used: Rolling Kerron Sedano (2  wheels) Transfers: Sit to/from Stand Sit to Stand: Min guard           General transfer comment: min guard for safety, no physical assist required    Ambulation/Gait Ambulation/Gait assistance: Min guard Gait Distance (Feet): 65 Feet Assistive device: Rolling Breshae Belcher (2 wheels) Gait Pattern/deviations: Step-to pattern;Decreased stride length;Wide base of support Gait velocity: decreased     General Gait Details: min guard for safety. Patient with flexed trunk due to pain. Cues for upright posture. 2/4 DOE but VSS on RA   Stairs             Wheelchair Mobility    Modified Rankin (Stroke Patients Only)       Balance Overall balance assessment: Needs assistance;History of Falls Sitting-balance support: No upper extremity supported;Feet supported Sitting balance-Leahy Scale: Good     Standing balance support: Bilateral upper extremity supported;During functional activity Standing balance-Leahy Scale: Poor Standing balance comment: reliant on UE support                            Cognition Arousal/Alertness: Awake/alert Behavior During Therapy: WFL for tasks assessed/performed Overall Cognitive Status: Within Functional Limits for tasks assessed                                          Exercises      General Comments General comments (skin integrity, edema, etc.): wearing abdominal binder      Pertinent Vitals/Pain Pain Assessment: Faces Faces Pain Scale: Hurts even more Pain Location: L flank Pain  Descriptors / Indicators: Grimacing;Guarding Pain Intervention(s): Monitored during session;Repositioned    Home Living                          Prior Function            PT Goals (current goals can now be found in the care plan section) Acute Rehab PT Goals Patient Stated Goal: to reduce pain PT Goal Formulation: With patient Time For Goal Achievement: 09/18/21 Potential to Achieve Goals: Good Progress  towards PT goals: Progressing toward goals    Frequency    Min 3X/week      PT Plan Current plan remains appropriate    Co-evaluation              AM-PAC PT "6 Clicks" Mobility   Outcome Measure  Help needed turning from your back to your side while in a flat bed without using bedrails?: A Little Help needed moving from lying on your back to sitting on the side of a flat bed without using bedrails?: A Little Help needed moving to and from a bed to a chair (including a wheelchair)?: A Little Help needed standing up from a chair using your arms (e.g., wheelchair or bedside chair)?: A Little Help needed to walk in hospital room?: A Little Help needed climbing 3-5 steps with a railing? : A Lot 6 Click Score: 17    End of Session   Activity Tolerance: Patient limited by pain;Patient limited by fatigue Patient left: in chair;with call bell/phone within reach Nurse Communication: Mobility status PT Visit Diagnosis: Unsteadiness on feet (R26.81);Muscle weakness (generalized) (M62.81);History of falling (Z91.81)     Time: 1630-1650 PT Time Calculation (min) (ACUTE ONLY): 20 min  Charges:  $Gait Training: 8-22 mins                     Isaias Dowson A. Gilford Rile PT, DPT Acute Rehabilitation Services Pager [phone] Office [phone]    Linna Hoff 09/05/2021, 5:14 PM

## 2021-09-05 NOTE — Progress Notes (Signed)
Subjective: Patient states her pain is slightly better today.  Off O2 and sating well.  Mobilized well with therapies and HH recommended.  ROS: See above, otherwise other systems negative  Objective: Vital signs in last 24 hours: Temp:  [97 F (36.1 C)-98.7 F (37.1 C)] 98.1 F (36.7 C) (11/18 0706) Pulse Rate:  [80-86] 86 (11/18 0706) Resp:  [16-28] 20 (11/18 0706) BP: (100-156)/(54-69) 125/59 (11/18 0706) SpO2:  [90 %-95 %] 94 % (11/18 0706) Last BM Date: 09/03/21  Intake/Output from previous day: 11/17 0701 - 11/18 0700 In: -  Out: 400 [Urine:400] Intake/Output this shift: No intake/output data recorded.  PE: Gen: NAD HEENT: PERRL Heart: irregular, but rate controlled Lungs: CTAB. moderate hematoma noted over L lower chest flank area.  Stable with abdominal binder in place Abd: soft, NT, obese, abdominal binder in place Ext: MAE Neuro: NVI Psych: A&Ox3  Lab Results:  Recent Labs    09/03/21 1632 09/03/21 2103 09/04/21 0245  WBC 19.1*  --  14.5*  HGB 10.4* 9.8* 9.0*  HCT 32.1* 30.3* 28.5*  PLT 334  --  333   BMET Recent Labs    09/03/21 1632  NA 138  K 4.1  CL 104  CO2 22  GLUCOSE 219*  BUN 15  CREATININE 0.87  CALCIUM 8.3*   PT/INR Recent Labs    09/03/21 1632 09/04/21 0245  LABPROT 32.4* 15.4*  INR 3.2* 1.2   CMP     Component Value Date/Time   NA 138 09/03/2021 1632   NA 141 10/31/2019 1653   K 4.1 09/03/2021 1632   CL 104 09/03/2021 1632   CO2 22 09/03/2021 1632   GLUCOSE 219 (H) 09/03/2021 1632   BUN 15 09/03/2021 1632   BUN 19 10/31/2019 1653   CREATININE 0.87 09/03/2021 1632   CALCIUM 8.3 (L) 09/03/2021 1632   PROT 5.7 (L) 09/03/2021 1632   PROT 6.7 10/31/2019 1653   ALBUMIN 2.8 (L) 09/03/2021 1632   ALBUMIN 3.9 10/31/2019 1653   AST 19 09/03/2021 1632   ALT 14 09/03/2021 1632   ALKPHOS 63 09/03/2021 1632   BILITOT 0.9 09/03/2021 1632   BILITOT 0.5 10/31/2019 1653   GFRNONAA >60 09/03/2021 1632   GFRAA 59 (L)  10/31/2019 1653   Lipase  No results found for: LIPASE     Studies/Results: CT HEAD WO CONTRAST (5MM)  Result Date: 09/03/2021 CLINICAL DATA:  Head trauma, minor (Age >= 65y) Fall with head and neck pain. EXAM: CT HEAD WITHOUT CONTRAST TECHNIQUE: Contiguous axial images were obtained from the base of the skull through the vertex without intravenous contrast. COMPARISON:  None. FINDINGS: Brain: No intracranial hemorrhage, mass effect, or midline shift. No hydrocephalus. The basilar cisterns are patent. No evidence of territorial infarct or acute ischemia. No extra-axial or intracranial fluid collection. Vascular: No hyperdense vessel or unexpected calcification. Skull: No fracture or focal lesion.  Frontal hyperostosis. Sinuses/Orbits: Paranasal sinuses and mastoid air cells are clear. The visualized orbits are unremarkable. Bilateral cataract resection. Other: None.  No confluent scalp hematoma. IMPRESSION: No acute intracranial abnormality. No skull fracture. Electronically Signed   By: Keith Rake M.D.   On: 09/03/2021 19:34   CT Cervical Spine Wo Contrast  Result Date: 09/03/2021 CLINICAL DATA:  Neck trauma (Age >= 65y) Post fall with head and neck pain. EXAM: CT CERVICAL SPINE WITHOUT CONTRAST TECHNIQUE: Multidetector CT imaging of the cervical spine was performed without intravenous contrast. Multiplanar CT image reconstructions were also generated.  COMPARISON:  None. FINDINGS: Alignment: Normal. Skull base and vertebrae: No acute fracture. Vertebral body heights are maintained. The dens and skull base are intact. Soft tissues and spinal canal: No prevertebral fluid or swelling. No visible canal hematoma. Disc levels: Disc space narrowing and anterior spurring C4-C5 through C6-C7. There is multilevel facet hypertrophy. Upper chest: Left pleural effusion. Assessed on concurrent chest CT, reported separately. Other: None. IMPRESSION: Degenerative change in the cervical spine without acute  fracture or subluxation. Electronically Signed   By: Keith Rake M.D.   On: 09/03/2021 19:38   DG Pelvis Portable  Result Date: 09/03/2021 CLINICAL DATA:  Fall EXAM: PORTABLE PELVIS 1 VIEW COMPARISON:  None. FINDINGS: Evaluation is limited by body habitus and single view. Within this limitation, there is no evidence of pelvic fracture or diastasis. No pelvic bone lesions are seen. IMPRESSION: Negative. Electronically Signed   By: Merilyn Baba M.D.   On: 09/03/2021 17:14   CT CHEST ABDOMEN PELVIS W CONTRAST  Result Date: 09/03/2021 CLINICAL DATA:  Trauma and left flank pain. EXAM: CT CHEST, ABDOMEN, AND PELVIS WITH CONTRAST TECHNIQUE: Multidetector CT imaging of the chest, abdomen and pelvis was performed following the standard protocol during bolus administration of intravenous contrast. CONTRAST:  128mL OMNIPAQUE IOHEXOL 350 MG/ML SOLN COMPARISON:  CT of the abdomen pelvis dated 02/01/2018. Chest radiograph dated 09/03/2021. Five FINDINGS: CT CHEST FINDINGS Cardiovascular: No cardiomegaly or pericardial effusion. The thoracic aorta is unremarkable. The origins of the great vessels of the aortic arch appear patent as visualized. The central pulmonary arteries are unremarkable. Mediastinum/Nodes: No hilar or mediastinal adenopathy. The esophagus is grossly unremarkable. Heterogeneous thyroid gland with small subcentimeter hypodense nodules as well as foci of calcification. Not clinically significant; no follow-up imaging recommended (ref: J Am Coll Radiol. 2015 Feb;12(2): 143-50).No mediastinal fluid collection. Lungs/Pleura: Moderate size high attenuating left pleural effusion consistent with hemothorax. There is partial consolidative changes of the left lower lobe. Minimal right lung base atelectasis. Tiny focus of pneumothorax may be present in the anterior left apex (22/5 and sagittal 99/7). No large pneumothorax. The central airways are patent. Musculoskeletal: Displaced fractures of the  posterior left eighth, 9, 10, and eleventh ribs. The tenth and eleventh ribs are fractured in 2 places and severely displaced. Clinical correlation is recommended to evaluate for possibility of flail chest. There is inward angulation and displacement of a fracture fragment of the tenth rib abutting the lateral aspect of the spleen. There is a large hematoma in the soft tissues of the left lateral chest wall and left flank measuring up to 8 x 20 cm in greatest axial dimensions and 13 cm in craniocaudal length. Several faint small foci of contrast blush (95/6) noted suggestive of active bleed. CT ABDOMEN PELVIS FINDINGS No intra-abdominal free air or free fluid. Hepatobiliary: Indeterminate 16 mm hypodense lesion in the left lobe of the liver, present on the pelvic CT. No intrahepatic biliary ductal dilatation. The gallbladder is unremarkable. Pancreas: Unremarkable. No pancreatic ductal dilatation or surrounding inflammatory changes. Spleen: Normal in size without focal abnormality. Adrenals/Urinary Tract: The adrenal glands unremarkable. There is no hydronephrosis on either side. Subcentimeter right renal hypodense lesion is not characterized. There is symmetric enhancement and excretion of contrast by both kidneys. The visualized ureters and urinary bladder appear unremarkable. Stomach/Bowel: There is no bowel obstruction or active inflammation. Appendectomy. Vascular/Lymphatic: The abdominal aorta and IVC are unremarkable. No portal venous gas. There is no adenopathy. Reproductive: Hysterectomy.  No adnexal masses Other: Small fat containing umbilical  hernia. Musculoskeletal: Degenerative changes of the spine. No acute osseous pathology. IMPRESSION: 1. Displaced fractures of the posterior left eighth, 9, 10, and eleventh ribs. The tenth and eleventh ribs are fractured in 2 places and severely displaced. 2. Moderate left hemothorax with partial consolidative changes of the left lower lobe. 3. Possible tiny left  apical pneumothorax. 4. Large hematoma in the soft tissues of the left lateral chest wall and left flank with evidence of active bleed. 5. No acute intra-abdominal or pelvic pathology. These results were called by telephone at the time of interpretation on 09/03/2021 at 7:42 pm to provider JOSHUA LONG , who verbally acknowledged these results. Electronically Signed   By: Anner Crete M.D.   On: 09/03/2021 19:46   DG CHEST PORT 1 VIEW  Result Date: 09/05/2021 CLINICAL DATA:  Left hemothorax. EXAM: PORTABLE CHEST 1 VIEW COMPARISON:  09/04/2021 FINDINGS: 0607 hours. Persistent retrocardiac left base collapse/consolidation with small effusion. Right lung remains clear. The cardio pericardial silhouette is enlarged. Left rib fracture seen by recent CT not well demonstrated on x-ray. Telemetry leads overlie the chest. IMPRESSION: No substantial change. Persistent retrocardiac left base collapse/consolidation with small effusion. Electronically Signed   By: Misty Stanley M.D.   On: 09/05/2021 06:40   DG CHEST PORT 1 VIEW  Result Date: 09/04/2021 CLINICAL DATA:  Fall, left rib pain, hemothorax EXAM: PORTABLE CHEST 1 VIEW COMPARISON:  09/03/2021 FINDINGS: No significant change in AP portable chest radiograph. Cardiomegaly with a small left pleural effusion. No focal airspace opacity. Known left rib fractures are not clearly appreciated radiographically. IMPRESSION: 1. No significant change in AP portable chest radiograph. Cardiomegaly with a small left pleural effusion. 2. Known left rib fractures are not clearly appreciated radiographically. Electronically Signed   By: Delanna Ahmadi M.D.   On: 09/04/2021 08:01   DG Chest Portable 1 View  Result Date: 09/03/2021 CLINICAL DATA:  Fall. EXAM: PORTABLE CHEST 1 VIEW COMPARISON:  Chest x-ray 06/20/2018. FINDINGS: Heart is mildly enlarged, unchanged. There are minimal interstitial opacities in the left lung base. The lungs are otherwise clear. No pleural effusion  or pneumothorax. No acute fractures are seen. IMPRESSION: 1. Cardiomegaly. 2. Minimal interstitial opacities in the left lung base may represent infection or atelectasis. Electronically Signed   By: Ronney Asters M.D.   On: 09/03/2021 17:15   DG Shoulder Left  Result Date: 09/03/2021 CLINICAL DATA:  Fall, pain EXAM: LEFT SHOULDER - 2+ VIEW COMPARISON:  None. FINDINGS: Degenerative changes in the left [facility] and glenohumeral joints with joint space narrowing and spurring, most pronounced in the [facility] joint. No acute bony abnormality. Specifically, no fracture, subluxation, or dislocation. IMPRESSION: Degenerative changes.  No acute bony abnormality. Electronically Signed   By: Rolm Baptise M.D.   On: 09/03/2021 22:06   DG HIP PORT UNILAT WITH PELVIS 1V LEFT  Result Date: 09/03/2021 CLINICAL DATA:  Fall, pain EXAM: DG HIP (WITH OR WITHOUT PELVIS) 1V PORT LEFT COMPARISON:  None. FINDINGS: Limited single view study. No visible fracture, subluxation or dislocation. Mild spurring in the left hip joint. IMPRESSION: Limited study.  No visible fracture Electronically Signed   By: Rolm Baptise M.D.   On: 09/03/2021 22:05    Anti-infectives: Anti-infectives (From admission, onward)    None        Assessment/Plan Fall Left 9 through 11 rib fractures - multi-modal pain control, therapies, IS, pulling between [phone].  mobilize Left hemothorax - stable on CXR again today.  No intervention needed for this and  sating well on RA. Left chest wall hematoma - stable, hgb has stabilized out, pending hgb this am.  Binder prn.  This will resolve on its own in time. History of CHF - per medicine A flutter-on Coumadin - INR down to 1.2 today.  Will need to hold coumadin for 2 weeks and then can resume Hypertension - per medicine ABL anemia - stabilizing out.  10.4 to 9.  Follow   Patient is stable for DC home from a trauma standpoint.  She may follow up with her PCP as needed for her rib fractures.  We discussed  these take about 6-8 weeks to heal.  We also discussed the hematoma may take weeks to months to heal as well.  PCP follow up appropriate for this as well PRN.  We will be available as needed.   LOS: 1 day    Henreitta Cea , [facility] 09/05/2021, 9:30 AM Please see Amion for pager number during day hours 7:00am-4:30pm or 7:00am -11:30am on weekends

## 2021-09-05 NOTE — Progress Notes (Addendum)
Pt oxygen was sustaining in the 80's ( 84-85 %) on room air while sleeping and OOB to chair sitting up. Pt placed on 2l oxygen and Sat up to low 90's (92 -93%). Pt endorse slight sob but denies any distress and no discomfort or distress assessed by RN. MD notified. RN will continue to closely monitor. Delia Heady RN

## 2021-09-05 NOTE — Progress Notes (Addendum)
[facility] PROGRESS NOTE    Progress Note  Linda Washington  EQA:[id number] DOB: 1951/07/14 DOA: 09/03/2021 PCP: Marda Stalker, PA-C     Brief Narrative:   Linda Washington is an 70 y.o. female past medical history significant for chronic diastolic heart failure, atrial fibrillation on Coumadin, struct of sleep apnea not on CPAP diabetes mellitus type 2 morbid obesity who presents after a fall, there was no loss of consciousness during tripped over a rug and fell on her left side, in the ED she had a white count of 19 hemoglobin of 10 (back in 2021 her hemoglobin was 12.4) CT of the chest abdomen and pelvis showed a left ninth 10th and 11th rib fracture the 10th and 11 ribs are displaced in 2 places, there is left effusion (probably hemothorax), tiny left apical pneumothorax and a large left hematoma of the soft tissues of the lateral chest wall, she was given vitamin K in the ED trauma surgery was consulted with and recommended Kcentra    Assessment/Plan:   Left Hemothorax and chest wall hematoma due to coumadin with left ninth, 10th and 11th rib fracture: Status post vitamin K and Kcentra, INR this morning was 1.2. Trauma on board recommended to repeat a chest x-ray and follow-up hemoglobin. Chest x-ray is unchanged from previous. Has been using tramadol for pain. Continue to encourage incentive spirometry and flutter valve. Out of bed to chair consult physical therapy. Continue to hold Coumadin for at least 2 weeks, her hemoglobin this morning is pending. Physical therapy evaluated the patient recommended home health PT.  Acute respiratory failure with hypoxia: Multifactorial due to rib fracture and OSA, now wn room air this morning, overnight had to be placed on 2 L as she became hypoxic. She has a known history of obstructive sleep apnea with noncompliance with her CPAP she relates she does not like it.  Hypotension: Continue to hold ARB, resume Lasix and continue  Coreg. Blood pressures remained stable continue to monitor.  atrial fibrillation rate controlled:  Holding Coumadin continue Coreg for rate control. INR this morning is 1.2.  Will need to be off Coumadin for at least 2 weeks.  Chronic diastolic heart failure: Continue Coreg and Lasix will resume low-dose ACE-I tomorrow morning.  Normocytic anemia acute blood loss anemia: Likely due to left hemothorax and large left-sided hematoma, appears not to be having any further bleeding her CBC is pending this morning.  Diabetes mellitus type 2 with hyperglycemia: A1c of 7.7, remains high continue sliding scale increase long-acting insulin.  Obstructive sleep apnea: Noncompliant with her CPAP at night expect her she will become hypoxic overnight due to her obstructive sleep apnea. Try to keep her on room air during the day.   DVT prophylaxis: SCD Family Communication:none Status is: Observation  The patient will require care spanning > 2 midnights and should be moved to inpatient because:       Code Status:     Code Status Orders  (From admission, onward)           Start     Ordered   09/03/21 2108  Full code  Continuous        09/03/21 2109           Code Status History     Date Active Date Inactive Code Status Order ID Comments User Context   06/20/2018 1521 06/23/2018 1651 DNR [id number]  Karmen Bongo, MD ED   02/01/2017 0842 02/01/2017 2301 Full Code [id number]  [place],  Neta Ehlers ED   04/08/2015 1218 04/10/2015 1848 Full Code [id number]  Frederik Pear, MD Inpatient         IV Access:   Peripheral IV   Procedures and diagnostic studies:   CT HEAD WO CONTRAST (5MM)  Result Date: 09/03/2021 CLINICAL DATA:  Head trauma, minor (Age >= 65y) Fall with head and neck pain. EXAM: CT HEAD WITHOUT CONTRAST TECHNIQUE: Contiguous axial images were obtained from the base of the skull through the vertex without intravenous contrast. COMPARISON:  None. FINDINGS: Brain:  No intracranial hemorrhage, mass effect, or midline shift. No hydrocephalus. The basilar cisterns are patent. No evidence of territorial infarct or acute ischemia. No extra-axial or intracranial fluid collection. Vascular: No hyperdense vessel or unexpected calcification. Skull: No fracture or focal lesion.  Frontal hyperostosis. Sinuses/Orbits: Paranasal sinuses and mastoid air cells are clear. The visualized orbits are unremarkable. Bilateral cataract resection. Other: None.  No confluent scalp hematoma. IMPRESSION: No acute intracranial abnormality. No skull fracture. Electronically Signed   By: Keith Rake M.D.   On: 09/03/2021 19:34   CT Cervical Spine Wo Contrast  Result Date: 09/03/2021 CLINICAL DATA:  Neck trauma (Age >= 65y) Post fall with head and neck pain. EXAM: CT CERVICAL SPINE WITHOUT CONTRAST TECHNIQUE: Multidetector CT imaging of the cervical spine was performed without intravenous contrast. Multiplanar CT image reconstructions were also generated. COMPARISON:  None. FINDINGS: Alignment: Normal. Skull base and vertebrae: No acute fracture. Vertebral body heights are maintained. The dens and skull base are intact. Soft tissues and spinal canal: No prevertebral fluid or swelling. No visible canal hematoma. Disc levels: Disc space narrowing and anterior spurring C4-C5 through C6-C7. There is multilevel facet hypertrophy. Upper chest: Left pleural effusion. Assessed on concurrent chest CT, reported separately. Other: None. IMPRESSION: Degenerative change in the cervical spine without acute fracture or subluxation. Electronically Signed   By: Keith Rake M.D.   On: 09/03/2021 19:38   DG Pelvis Portable  Result Date: 09/03/2021 CLINICAL DATA:  Fall EXAM: PORTABLE PELVIS 1 VIEW COMPARISON:  None. FINDINGS: Evaluation is limited by body habitus and single view. Within this limitation, there is no evidence of pelvic fracture or diastasis. No pelvic bone lesions are seen. IMPRESSION:  Negative. Electronically Signed   By: Merilyn Baba M.D.   On: 09/03/2021 17:14   CT CHEST ABDOMEN PELVIS W CONTRAST  Result Date: 09/03/2021 CLINICAL DATA:  Trauma and left flank pain. EXAM: CT CHEST, ABDOMEN, AND PELVIS WITH CONTRAST TECHNIQUE: Multidetector CT imaging of the chest, abdomen and pelvis was performed following the standard protocol during bolus administration of intravenous contrast. CONTRAST:  165mL OMNIPAQUE IOHEXOL 350 MG/ML SOLN COMPARISON:  CT of the abdomen pelvis dated 02/01/2018. Chest radiograph dated 09/03/2021. Five FINDINGS: CT CHEST FINDINGS Cardiovascular: No cardiomegaly or pericardial effusion. The thoracic aorta is unremarkable. The origins of the great vessels of the aortic arch appear patent as visualized. The central pulmonary arteries are unremarkable. Mediastinum/Nodes: No hilar or mediastinal adenopathy. The esophagus is grossly unremarkable. Heterogeneous thyroid gland with small subcentimeter hypodense nodules as well as foci of calcification. Not clinically significant; no follow-up imaging recommended (ref: J Am Coll Radiol. 2015 Feb;12(2): 143-50).No mediastinal fluid collection. Lungs/Pleura: Moderate size high attenuating left pleural effusion consistent with hemothorax. There is partial consolidative changes of the left lower lobe. Minimal right lung base atelectasis. Tiny focus of pneumothorax may be present in the anterior left apex (22/5 and sagittal 99/7). No large pneumothorax. The central airways are patent. Musculoskeletal: Displaced fractures  of the posterior left eighth, 9, 10, and eleventh ribs. The tenth and eleventh ribs are fractured in 2 places and severely displaced. Clinical correlation is recommended to evaluate for possibility of flail chest. There is inward angulation and displacement of a fracture fragment of the tenth rib abutting the lateral aspect of the spleen. There is a large hematoma in the soft tissues of the left lateral chest wall and  left flank measuring up to 8 x 20 cm in greatest axial dimensions and 13 cm in craniocaudal length. Several faint small foci of contrast blush (95/6) noted suggestive of active bleed. CT ABDOMEN PELVIS FINDINGS No intra-abdominal free air or free fluid. Hepatobiliary: Indeterminate 16 mm hypodense lesion in the left lobe of the liver, present on the pelvic CT. No intrahepatic biliary ductal dilatation. The gallbladder is unremarkable. Pancreas: Unremarkable. No pancreatic ductal dilatation or surrounding inflammatory changes. Spleen: Normal in size without focal abnormality. Adrenals/Urinary Tract: The adrenal glands unremarkable. There is no hydronephrosis on either side. Subcentimeter right renal hypodense lesion is not characterized. There is symmetric enhancement and excretion of contrast by both kidneys. The visualized ureters and urinary bladder appear unremarkable. Stomach/Bowel: There is no bowel obstruction or active inflammation. Appendectomy. Vascular/Lymphatic: The abdominal aorta and IVC are unremarkable. No portal venous gas. There is no adenopathy. Reproductive: Hysterectomy.  No adnexal masses Other: Small fat containing umbilical hernia. Musculoskeletal: Degenerative changes of the spine. No acute osseous pathology. IMPRESSION: 1. Displaced fractures of the posterior left eighth, 9, 10, and eleventh ribs. The tenth and eleventh ribs are fractured in 2 places and severely displaced. 2. Moderate left hemothorax with partial consolidative changes of the left lower lobe. 3. Possible tiny left apical pneumothorax. 4. Large hematoma in the soft tissues of the left lateral chest wall and left flank with evidence of active bleed. 5. No acute intra-abdominal or pelvic pathology. These results were called by telephone at the time of interpretation on 09/03/2021 at 7:42 pm to provider JOSHUA LONG , who verbally acknowledged these results. Electronically Signed   By: Anner Crete M.D.   On: 09/03/2021 19:46    DG CHEST PORT 1 VIEW  Result Date: 09/05/2021 CLINICAL DATA:  Left hemothorax. EXAM: PORTABLE CHEST 1 VIEW COMPARISON:  09/04/2021 FINDINGS: 0607 hours. Persistent retrocardiac left base collapse/consolidation with small effusion. Right lung remains clear. The cardio pericardial silhouette is enlarged. Left rib fracture seen by recent CT not well demonstrated on x-ray. Telemetry leads overlie the chest. IMPRESSION: No substantial change. Persistent retrocardiac left base collapse/consolidation with small effusion. Electronically Signed   By: Misty Stanley M.D.   On: 09/05/2021 06:40   DG CHEST PORT 1 VIEW  Result Date: 09/04/2021 CLINICAL DATA:  Fall, left rib pain, hemothorax EXAM: PORTABLE CHEST 1 VIEW COMPARISON:  09/03/2021 FINDINGS: No significant change in AP portable chest radiograph. Cardiomegaly with a small left pleural effusion. No focal airspace opacity. Known left rib fractures are not clearly appreciated radiographically. IMPRESSION: 1. No significant change in AP portable chest radiograph. Cardiomegaly with a small left pleural effusion. 2. Known left rib fractures are not clearly appreciated radiographically. Electronically Signed   By: Delanna Ahmadi M.D.   On: 09/04/2021 08:01   DG Chest Portable 1 View  Result Date: 09/03/2021 CLINICAL DATA:  Fall. EXAM: PORTABLE CHEST 1 VIEW COMPARISON:  Chest x-ray 06/20/2018. FINDINGS: Heart is mildly enlarged, unchanged. There are minimal interstitial opacities in the left lung base. The lungs are otherwise clear. No pleural effusion or pneumothorax. No acute  fractures are seen. IMPRESSION: 1. Cardiomegaly. 2. Minimal interstitial opacities in the left lung base may represent infection or atelectasis. Electronically Signed   By: Ronney Asters M.D.   On: 09/03/2021 17:15   DG Shoulder Left  Result Date: 09/03/2021 CLINICAL DATA:  Fall, pain EXAM: LEFT SHOULDER - 2+ VIEW COMPARISON:  None. FINDINGS: Degenerative changes in the left [facility] and  glenohumeral joints with joint space narrowing and spurring, most pronounced in the [facility] joint. No acute bony abnormality. Specifically, no fracture, subluxation, or dislocation. IMPRESSION: Degenerative changes.  No acute bony abnormality. Electronically Signed   By: Rolm Baptise M.D.   On: 09/03/2021 22:06   DG HIP PORT UNILAT WITH PELVIS 1V LEFT  Result Date: 09/03/2021 CLINICAL DATA:  Fall, pain EXAM: DG HIP (WITH OR WITHOUT PELVIS) 1V PORT LEFT COMPARISON:  None. FINDINGS: Limited single view study. No visible fracture, subluxation or dislocation. Mild spurring in the left hip joint. IMPRESSION: Limited study.  No visible fracture Electronically Signed   By: Rolm Baptise M.D.   On: 09/03/2021 22:05     Medical Consultants:   None.   Subjective:    Linda Washington pain controlled ambulated well with physical therapy denies any bowel movements.  Objective:    Vitals:   09/04/21 1943 09/04/21 2317 09/05/21 0306 09/05/21 0706  BP: (!) 146/56 (!) 148/66 (!) 156/59 (!) 125/59  Pulse: 86 85 86 86  Resp: 16 18 (!) 22 20  Temp: 98.2 F (36.8 C) 97.7 F (36.5 C) 98.4 F (36.9 C) 98.1 F (36.7 C)  TempSrc: Oral Oral Oral Oral  SpO2: 92% 95% 95% 94%  Weight:      Height:       SpO2: 94 % O2 Flow Rate (L/min): 2 L/min   Intake/Output Summary (Last 24 hours) at 09/05/2021 0754 Last data filed at 09/05/2021 0308 Gross per 24 hour  Intake --  Output 400 ml  Net -400 ml    Filed Weights   09/03/21 1612  Weight: 127.9 kg    Exam: General exam: In no acute distress. Respiratory system: Good air movement and clear to auscultation. Cardiovascular system: S1 & S2 heard, RRR. No JVD. Gastrointestinal system: Abdomen is nondistended, soft and nontender.  Extremities: No pedal edema. Skin: No rashes, lesions or ulcers Psychiatry: Judgement and insight appear normal. Mood & affect appropriate.   Data Reviewed:    Labs: Basic Metabolic Panel: Recent Labs  Lab  09/03/21 1632  NA 138  K 4.1  CL 104  CO2 22  GLUCOSE 219*  BUN 15  CREATININE 0.87  CALCIUM 8.3*    GFR Estimated Creatinine Clearance: 82.6 mL/min (by C-G formula based on SCr of 0.87 mg/dL). Liver Function Tests: Recent Labs  Lab 09/03/21 1632  AST 19  ALT 14  ALKPHOS 63  BILITOT 0.9  PROT 5.7*  ALBUMIN 2.8*    No results for input(s): LIPASE, AMYLASE in the last 168 hours. No results for input(s): AMMONIA in the last 168 hours. Coagulation profile Recent Labs  Lab 09/03/21 1632 09/04/21 0245  INR 3.2* 1.2    COVID-19 Labs  No results for input(s): DDIMER, FERRITIN, LDH, CRP in the last 72 hours.  Lab Results  Component Value Date   [place] NEGATIVE 09/03/2021    CBC: Recent Labs  Lab 09/03/21 1632 09/03/21 2103 09/04/21 0245  WBC 19.1*  --  14.5*  NEUTROABS 16.6*  --   --   HGB 10.4* 9.8* 9.0*  HCT 32.1* 30.3*  28.5*  MCV 90.4  --  90.8  PLT 334  --  333    Cardiac Enzymes: No results for input(s): CKTOTAL, CKMB, CKMBINDEX, TROPONINI in the last 168 hours. BNP (last 3 results) No results for input(s): PROBNP in the last 8760 hours. CBG: Recent Labs  Lab 09/04/21 0201 09/04/21 1107 09/04/21 1632 09/04/21 2140 09/05/21 0736  GLUCAP 278* 347* 261* 182* 202*    D-Dimer: No results for input(s): DDIMER in the last 72 hours. Hgb A1c: Recent Labs    09/03/21 2124  HGBA1C 7.7*    Lipid Profile: No results for input(s): CHOL, HDL, LDLCALC, TRIG, CHOLHDL, LDLDIRECT in the last 72 hours. Thyroid function studies: No results for input(s): TSH, T4TOTAL, T3FREE, THYROIDAB in the last 72 hours.  Invalid input(s): FREET3 Anemia work up: No results for input(s): VITAMINB12, FOLATE, FERRITIN, TIBC, IRON, RETICCTPCT in the last 72 hours. Sepsis Labs: Recent Labs  Lab 09/03/21 1632 09/04/21 0245  WBC 19.1* 14.5*    Microbiology Recent Results (from the past 240 hour(s))  Resp Panel by RT-PCR (Flu A&B, Covid) Nasopharyngeal Swab      Status: None   Collection Time: 09/03/21  7:47 PM   Specimen: Nasopharyngeal Swab; Nasopharyngeal(NP) swabs in vial transport medium  Result Value Ref Range Status   SARS Coronavirus 2 by RT PCR NEGATIVE NEGATIVE Final    Comment: (NOTE) SARS-CoV-2 target nucleic acids are NOT DETECTED.  The SARS-CoV-2 RNA is generally detectable in upper respiratory specimens during the acute phase of infection. The lowest concentration of SARS-CoV-2 viral copies this assay can detect is 138 copies/mL. A negative result does not preclude SARS-Cov-2 infection and should not be used as the sole basis for treatment or other patient management decisions. A negative result may occur with  improper specimen collection/handling, submission of specimen other than nasopharyngeal swab, presence of viral mutation(s) within the areas targeted by this assay, and inadequate number of viral copies(<138 copies/mL). A negative result must be combined with clinical observations, patient history, and epidemiological information. The expected result is Negative.  Fact Sheet for Patients:  EntrepreneurPulse.com.au  Fact Sheet for Healthcare Providers:  IncredibleEmployment.be  This test is no t yet approved or cleared by the [place] FDA and  has been authorized for detection and/or diagnosis of SARS-CoV-2 by FDA under an Emergency Use Authorization (EUA). This EUA will remain  in effect (meaning this test can be used) for the duration of the COVID-19 declaration under Section 564(b)(1) of the Act, 21 U.S.C.section 360bbb-3(b)(1), unless the authorization is terminated  or revoked sooner.       Influenza A by PCR NEGATIVE NEGATIVE Final   Influenza B by PCR NEGATIVE NEGATIVE Final    Comment: (NOTE) The Xpert Xpress SARS-CoV-2/FLU/RSV plus assay is intended as an aid in the diagnosis of influenza from Nasopharyngeal swab specimens and should not be used as a sole basis  for treatment. Nasal washings and aspirates are unacceptable for Xpert Xpress SARS-CoV-2/FLU/RSV testing.  Fact Sheet for Patients: EntrepreneurPulse.com.au  Fact Sheet for Healthcare Providers: IncredibleEmployment.be  This test is not yet approved or cleared by the [place] FDA and has been authorized for detection and/or diagnosis of SARS-CoV-2 by FDA under an Emergency Use Authorization (EUA). This EUA will remain in effect (meaning this test can be used) for the duration of the COVID-19 declaration under Section 564(b)(1) of the Act, 21 U.S.C. section 360bbb-3(b)(1), unless the authorization is terminated or revoked.  Performed at [facility], [place]  [address] [place], [place] [address]   MRSA Next Gen by PCR, Nasal     Status: None   Collection Time: 09/03/21 10:04 PM   Specimen: Nasal Mucosa; Nasal Swab  Result Value Ref Range Status   MRSA by PCR Next Gen NOT DETECTED NOT DETECTED Final    Comment: (NOTE) The GeneXpert MRSA Assay (FDA approved for NASAL specimens only), is one component of a comprehensive MRSA colonization surveillance program. It is not intended to diagnose MRSA infection nor to guide or monitor treatment for MRSA infections. Test performance is not FDA approved in patients less than 59 years old. Performed at [facility], [place] [address] [place], [place] [address]      Medications:    acetaminophen  1,000 mg Oral Q6H   atorvastatin  40 mg Oral QHS   carvedilol  3.125 mg Oral BID WC   citalopram  20 mg Oral Daily   fentaNYL (SUBLIMAZE) injection  25 mcg Intravenous Once   insulin aspart  0-15 Units Subcutaneous TID WC   insulin aspart  0-5 Units Subcutaneous QHS   insulin aspart  3 Units Subcutaneous TID WC   insulin detemir  5 Units Subcutaneous BID   metaxalone  400 mg Oral TID   oxybutynin  15 mg Oral Daily   pantoprazole  40 mg Oral Daily   polyethylene glycol  17 g Oral BID    Continuous Infusions:    LOS: 1 day   Charlynne Cousins  [facility]  09/05/2021, 7:54 AM

## 2021-09-05 NOTE — Evaluation (Signed)
Occupational Therapy Evaluation Patient Details Name: Linda Washington MRN: [id number] DOB: [birth year] Today's Date: 09/05/2021   History of Present Illness 70 y/o female presented to ED on 11/16 following a fall with severe L back/flank pain with large hematoma. CT head negative. CT chest/abdomen showed displaced fx of posterior L 8-11 ribs, moderate L hemothorax with partial consolidative changes of L lower lobe, possibly tiny L apical pneumothorax, large hematoma in soft tissues of L lateral chest wall and L flank. PMH: diabetes, HLD, Atrial flutter on Coumadin, HTN, CHF, neuropathy, sleep apnea   Clinical Impression   PTA pt independent with occasional use of cane for mobility and ADL/IADL tasks. Son lives with pt and will be able to assist at DC. Pt demonstrates a significant functional decline, requiring Max A with LB ADL due to deficits listed below. Recommend follow up with Daniel. Would  benefit from DME as listed below and HH Adie if available.  Will follow acutely. SpO2 above 92 on RA throughout session, however HR 133 with ambulation to bathroom; 2/4 DOE.      Recommendations for follow up therapy are one component of a multi-disciplinary discharge planning process, led by the attending physician.  Recommendations may be updated based on patient status, additional functional criteria and insurance authorization.   Follow Up Recommendations  Home health OT    Assistance Recommended at Discharge Intermittent Supervision/Assistance  Functional Status Assessment  Patient has had a recent decline in their functional status and demonstrates the ability to make significant improvements in function in a reasonable and predictable amount of time.  Equipment Recommendations  Tub/shower bench;BSC/3in1    Recommendations for Other Services       Precautions / Restrictions Precautions Precautions: Fall Precaution Comments: watch O2      Mobility Bed Mobility Plans on sleeping in  recliner      General bed mobility comments: OOB in chair Transfers Overall transfer level: Needs assistance Equipment used: None Transfers: Sit to/from Stand Sit to Stand: Min assist           General transfer comment: minA to rise and steady from low bed surface      Balance Overall balance assessment: Needs assistance;History of Falls Sitting-balance support: No upper extremity supported;Feet supported Sitting balance-Leahy Scale: Good     Standing balance support: Single extremity supported;During functional activity Standing balance-Leahy Scale: Poor Standing balance comment: requires minA for balance. Will benefit from RW for support                           ADL either performed or assessed with clinical judgement   ADL Overall ADL's : Needs assistance/impaired     Grooming: Set up;Sitting   Upper Body Bathing: Set up;Sitting   Lower Body Bathing: Moderate assistance;Sit to/from stand   Upper Body Dressing : Set up;Sitting   Lower Body Dressing: Maximal assistance;Sit to/from stand   Toilet Transfer: Minimal assistance;Ambulation;BSC/3in1 (over toilet)   Toileting- Clothing Manipulation and Hygiene: Maximal assistance Toileting - Clothing Manipulation Details (indicate cue type and reason): unable to complete pericare - would benefit from AE     Functional mobility during ADLs: Minimal assistance;Rolling walker (2 wheels) General ADL Comments: would benefit fomr AE for LB ADL     Vision Baseline Vision/History: 1 Wears glasses (erading)       Perception     Praxis      Pertinent Vitals/Pain Pain Assessment: 0-10 Pain Score: 5  Pain Location: L  flank Pain Descriptors / Indicators: Grimacing;Guarding Pain Intervention(s): Limited activity within patient's tolerance     Hand Dominance     Extremity/Trunk Assessment Upper Extremity Assessment Upper Extremity Assessment: LUE deficits/detail LUE Deficits / Details: shoulder  deficits at baselinebutfunctional       Cervical / Trunk Assessment Cervical / Trunk Assessment: Kyphotic   Communication Communication Communication: No difficulties   Cognition Arousal/Alertness: Awake/alert Behavior During Therapy: WFL for tasks assessed/performed Overall Cognitive Status: Within Functional Limits for tasks assessed                                       General Comments  wearing abdominal binder    Exercises Exercises: Other exercises (encouraged use of incentive spirometer)   Shoulder Instructions      Home Living Family/patient expects to be discharged to:: Private residence Living Arrangements: Children Available Help at Discharge: Family;Available 24 hours/day Type of Home: House Home Access: Stairs to enter [employer] of Steps: 5 Entrance Stairs-Rails: Left Home Layout: One level     Bathroom Shower/Tub: Tub/shower unit;Curtain   Bathroom Toilet: Handicapped height Bathroom Accessibility: No   Home Equipment: Cane - single point;Grab bars - tub/shower          Prior Functioning/Environment Prior Level of Function : Independent/Modified Independent;Driving;History of Falls (last six months)             Mobility Comments: uses SPC for community mobility and occasionally in the home. Reports 2-3 falls in past 6 months          OT Problem List: Decreased strength;Decreased range of motion;Decreased activity tolerance;Impaired balance (sitting and/or standing);Decreased safety awareness;Decreased knowledge of use of DME or AE;Cardiopulmonary status limiting activity;Obesity;Pain      OT Treatment/Interventions: Self-care/ADL training;Energy conservation;DME and/or AE instruction;Therapeutic activities;Patient/family education;Balance training    OT Goals(Current goals can be found in the care plan section) Acute Rehab OT Goals Patient Stated Goal: to be able to take care of herself OT Goal Formulation:  With patient Time For Goal Achievement: 09/18/21 Potential to Achieve Goals: Good  OT Frequency: Min 2X/week   Barriers to D/C:            Co-evaluation              AM-PAC OT "6 Clicks" Daily Activity     Outcome Measure Help from another person eating meals?: None Help from another person taking care of personal grooming?: A Little Help from another person toileting, which includes using toliet, bedpan, or urinal?: A Lot Help from another person bathing (including washing, rinsing, drying)?: A Lot Help from another person to put on and taking off regular upper body clothing?: A Little Help from another person to put on and taking off regular lower body clothing?: A Lot 6 Click Score: 16   End of Session Equipment Utilized During Treatment: Rolling walker (2 wheels) Nurse Communication: Mobility status;Other (comment) (encourage ambulation to toilet rather than purewick)  Activity Tolerance: Patient tolerated treatment well Patient left: Other (comment) (in bathroom with NT)  OT Visit Diagnosis: Unsteadiness on feet (R26.81);Muscle weakness (generalized) (M62.81);History of falling (Z91.81);Pain Pain - Right/Left: Left Pain - part of body:  (chest)                Time: 2542-7062 OT Time Calculation (min): 24 min Charges:  OT General Charges $OT Visit: 1 Visit OT Evaluation $OT Eval Moderate Complexity: 1 Mod  OT Treatments $Self Care/Home Management : 8-22 mins  Maurie Boettcher, OT/L   Acute OT Clinical Specialist Acute Rehabilitation Services Pager [phone] Office [phone]   [facility] 09/05/2021, 9:23 AM

## 2021-09-06 DIAGNOSIS — I4892 Unspecified atrial flutter: Secondary | ICD-10-CM | POA: Diagnosis not present

## 2021-09-06 DIAGNOSIS — I5032 Chronic diastolic (congestive) heart failure: Secondary | ICD-10-CM | POA: Diagnosis not present

## 2021-09-06 DIAGNOSIS — J942 Hemothorax: Secondary | ICD-10-CM | POA: Diagnosis not present

## 2021-09-06 DIAGNOSIS — G4733 Obstructive sleep apnea (adult) (pediatric): Secondary | ICD-10-CM | POA: Diagnosis not present

## 2021-09-06 LAB — CBC
HCT: 23.2 % — ABNORMAL LOW (ref 36.0–46.0)
Hemoglobin: 7.3 g/dL — ABNORMAL LOW (ref 12.0–15.0)
MCH: 28.9 pg (ref 26.0–34.0)
MCHC: 31.5 g/dL (ref 30.0–36.0)
MCV: 91.7 fL (ref 80.0–100.0)
Platelets: 306 10*3/uL (ref 150–400)
RBC: 2.53 MIL/uL — ABNORMAL LOW (ref 3.87–5.11)
RDW: 14.5 % (ref 11.5–15.5)
WBC: 16.6 10*3/uL — ABNORMAL HIGH (ref 4.0–10.5)
nRBC: 0.2 % (ref 0.0–0.2)

## 2021-09-06 LAB — CBC WITH DIFFERENTIAL/PLATELET
Abs Immature Granulocytes: 0.1 10*3/uL — ABNORMAL HIGH (ref 0.00–0.07)
Basophils Absolute: 0 10*3/uL (ref 0.0–0.1)
Basophils Relative: 0 %
Eosinophils Absolute: 0.1 10*3/uL (ref 0.0–0.5)
Eosinophils Relative: 1 %
HCT: 23.2 % — ABNORMAL LOW (ref 36.0–46.0)
Hemoglobin: 7.3 g/dL — ABNORMAL LOW (ref 12.0–15.0)
Immature Granulocytes: 1 %
Lymphocytes Relative: 12 %
Lymphs Abs: 2 10*3/uL (ref 0.7–4.0)
MCH: 28.7 pg (ref 26.0–34.0)
MCHC: 31.5 g/dL (ref 30.0–36.0)
MCV: 91.3 fL (ref 80.0–100.0)
Monocytes Absolute: 1.7 10*3/uL — ABNORMAL HIGH (ref 0.1–1.0)
Monocytes Relative: 10 %
Neutro Abs: 13.7 10*3/uL — ABNORMAL HIGH (ref 1.7–7.7)
Neutrophils Relative %: 76 %
Platelets: 341 10*3/uL (ref 150–400)
RBC: 2.54 MIL/uL — ABNORMAL LOW (ref 3.87–5.11)
RDW: 14.6 % (ref 11.5–15.5)
WBC: 17.8 10*3/uL — ABNORMAL HIGH (ref 4.0–10.5)
nRBC: 0.1 % (ref 0.0–0.2)

## 2021-09-06 LAB — GLUCOSE, CAPILLARY
Glucose-Capillary: 197 mg/dL — ABNORMAL HIGH (ref 70–99)
Glucose-Capillary: 207 mg/dL — ABNORMAL HIGH (ref 70–99)
Glucose-Capillary: 134 mg/dL — ABNORMAL HIGH (ref 70–99)
Glucose-Capillary: 220 mg/dL — ABNORMAL HIGH (ref 70–99)

## 2021-09-06 LAB — PREPARE RBC (CROSSMATCH)

## 2021-09-06 MED ORDER — SODIUM CHLORIDE 0.9% IV SOLUTION: Freq: Once | INTRAVENOUS | Status: AC

## 2021-09-06 NOTE — Progress Notes (Signed)
[facility] PROGRESS NOTE    Progress Note  Linda Washington  QIW:[id number] DOB: 1951-05-04 DOA: 09/03/2021 PCP: Marda Stalker, PA-C     Brief Narrative:   Linda Washington is an 70 y.o. female past medical history significant for chronic diastolic heart failure, atrial fibrillation on Coumadin, struct of sleep apnea not on CPAP diabetes mellitus type 2 morbid obesity who presents after a fall, there was no loss of consciousness during tripped over a rug and fell on her left side, in the ED she had a white count of 19 hemoglobin of 10 (back in 2021 her hemoglobin was 12.4) CT of the chest abdomen and pelvis showed a left ninth 10th and 11th rib fracture the 10th and 11 ribs are displaced in 2 places, there is left effusion (probably hemothorax), tiny left apical pneumothorax and a large left hematoma of the soft tissues of the lateral chest wall, she was given vitamin K in the ED trauma surgery was consulted with and recommended Kcentra    Assessment/Plan:   Left Hemothorax and chest wall hematoma due to coumadin with left ninth, 10th and 11th rib fracture: Status post vitamin K and Kcentra, INR this morning was 1.2. Trauma on board and appreciate assistance.  Related distal take about 6 to 8 weeks to heal. She can be started back on anticoagulation in 2 weeks after discharge. Working for her pain.  Tramadol has been with tramadol has been working for her pain. Physical therapy evaluated the patient and recommended home health PT. Check a CBC with differential today to monitor hemoglobin as her white count is trending up.  She has remained afebrile  Acute respiratory failure with hypoxia: Multifactorial due to rib fracture and OSA, now wn room air this morning, overnight had to be placed on 2 L as she became hypoxic. Anything above 88% is good on her. She has a known history of obstructive sleep apnea with noncompliance with her CPAP she relates she does not like  it.  Hypotension: Continue to hold ARB, resume Lasix and continue Coreg. She will need to be discharged off her ARB and follow-up with PCP as an outpatient.  atrial fibrillation rate controlled:  Holding Coumadin continue Coreg for rate control. INR this morning is 1.2.  Will need to be off Coumadin for at least 2 weeks.  Chronic diastolic heart failure: Continue Coreg and Lasix will, continue to hold ACE inhibitor better, blood pressure stable  Normocytic anemia acute blood loss anemia: Likely due to left hemothorax and large left-sided hematoma. Her hemoglobin this morning 7.7 CBC is pending this morning.  Diabetes mellitus type 2 with hyperglycemia: A1c of 7.7, remains high continue sliding scale increase long-acting insulin.  Obstructive sleep apnea: Noncompliant with her CPAP at night expect her she will become hypoxic overnight due to her obstructive sleep apnea. Try to keep her on room air during the day.   DVT prophylaxis: SCD Family Communication:none Status is: Observation  The patient will require care spanning > 2 midnights and should be moved to inpatient because:       Code Status:     Code Status Orders  (From admission, onward)           Start     Ordered   09/03/21 2108  Full code  Continuous        09/03/21 2109           Code Status History     Date Active Date Inactive Code Status Order  ID Comments User Context   06/20/2018 1521 06/23/2018 1651 DNR [id number]  Karmen Bongo, MD ED   02/01/2017 0842 02/01/2017 2301 Full Code [id number]  Ahmed Prima Fransisco Hertz, PA-C ED   04/08/2015 1218 04/10/2015 1848 Full Code [id number]  Frederik Pear, MD Inpatient         IV Access:   Peripheral IV   Procedures and diagnostic studies:   DG CHEST PORT 1 VIEW  Result Date: 09/05/2021 CLINICAL DATA:  Left hemothorax. EXAM: PORTABLE CHEST 1 VIEW COMPARISON:  09/04/2021 FINDINGS: 0607 hours. Persistent retrocardiac left base collapse/consolidation with  small effusion. Right lung remains clear. The cardio pericardial silhouette is enlarged. Left rib fracture seen by recent CT not well demonstrated on x-ray. Telemetry leads overlie the chest. IMPRESSION: No substantial change. Persistent retrocardiac left base collapse/consolidation with small effusion. Electronically Signed   By: Misty Stanley M.D.   On: 09/05/2021 06:40     Medical Consultants:   None.   Subjective:    Linda Washington has had 2 bowel movements she relates sometimes the pain control gets away from her but the pain medication seems to be working  Objective:    Vitals:   09/05/21 1920 09/05/21 2304 09/06/21 0309 09/06/21 0711  BP: 93/70 (!) 118/46 119/65 (!) 118/50  Pulse: 86 86 86 85  Resp: 19 18 17 17   Temp: 97.6 F (36.4 C) 97.8 F (36.6 C) 97.8 F (36.6 C) 98.4 F (36.9 C)  TempSrc: Oral Oral Oral Oral  SpO2: 96% 95% 97% 94%  Weight:      Height:       SpO2: 94 % O2 Flow Rate (L/min): 2 L/min   Intake/Output Summary (Last 24 hours) at 09/06/2021 0844 Last data filed at 09/05/2021 1459 Gross per 24 hour  Intake 1100 ml  Output 500 ml  Net 600 ml    Filed Weights   09/03/21 1612  Weight: 127.9 kg    Exam: General exam: In no acute distress. Respiratory system: Good air movement and clear to auscultation. Cardiovascular system: S1 & S2 heard, RRR. No JVD. Gastrointestinal system: Abdomen is nondistended, soft and nontender.  Extremities: No pedal edema. Skin: No rashes, lesions or ulcers Psychiatry: Judgement and insight appear normal. Mood & affect appropriate.   Data Reviewed:    Labs: Basic Metabolic Panel: Recent Labs  Lab 09/03/21 1632  NA 138  K 4.1  CL 104  CO2 22  GLUCOSE 219*  BUN 15  CREATININE 0.87  CALCIUM 8.3*    GFR Estimated Creatinine Clearance: 82.6 mL/min (by C-G formula based on SCr of 0.87 mg/dL). Liver Function Tests: Recent Labs  Lab 09/03/21 1632  AST 19  ALT 14  ALKPHOS 63  BILITOT 0.9   PROT 5.7*  ALBUMIN 2.8*    No results for input(s): LIPASE, AMYLASE in the last 168 hours. No results for input(s): AMMONIA in the last 168 hours. Coagulation profile Recent Labs  Lab 09/03/21 1632 09/04/21 0245  INR 3.2* 1.2    COVID-19 Labs  No results for input(s): DDIMER, FERRITIN, LDH, CRP in the last 72 hours.  Lab Results  Component Value Date   [place] NEGATIVE 09/03/2021    CBC: Recent Labs  Lab 09/03/21 1632 09/03/21 2103 09/04/21 0245 09/05/21 1030  WBC 19.1*  --  14.5* 17.8*  NEUTROABS 16.6*  --   --  13.6*  HGB 10.4* 9.8* 9.0* 7.7*  HCT 32.1* 30.3* 28.5* 23.4*  MCV 90.4  --  90.8 89.7  PLT 334  --  333 305    Cardiac Enzymes: No results for input(s): CKTOTAL, CKMB, CKMBINDEX, TROPONINI in the last 168 hours. BNP (last 3 results) No results for input(s): PROBNP in the last 8760 hours. CBG: Recent Labs  Lab 09/05/21 1457 09/05/21 1710 09/05/21 2146 09/05/21 2303 09/06/21 0754  GLUCAP 218* 189* 182* 163* 197*    D-Dimer: No results for input(s): DDIMER in the last 72 hours. Hgb A1c: Recent Labs    09/03/21 2124  HGBA1C 7.7*    Lipid Profile: No results for input(s): CHOL, HDL, LDLCALC, TRIG, CHOLHDL, LDLDIRECT in the last 72 hours. Thyroid function studies: No results for input(s): TSH, T4TOTAL, T3FREE, THYROIDAB in the last 72 hours.  Invalid input(s): FREET3 Anemia work up: No results for input(s): VITAMINB12, FOLATE, FERRITIN, TIBC, IRON, RETICCTPCT in the last 72 hours. Sepsis Labs: Recent Labs  Lab 09/03/21 1632 09/04/21 0245 09/05/21 1030  WBC 19.1* 14.5* 17.8*    Microbiology Recent Results (from the past 240 hour(s))  Resp Panel by RT-PCR (Flu A&B, Covid) Nasopharyngeal Swab     Status: None   Collection Time: 09/03/21  7:47 PM   Specimen: Nasopharyngeal Swab; Nasopharyngeal(NP) swabs in vial transport medium  Result Value Ref Range Status   SARS Coronavirus 2 by RT PCR NEGATIVE NEGATIVE Final    Comment:  (NOTE) SARS-CoV-2 target nucleic acids are NOT DETECTED.  The SARS-CoV-2 RNA is generally detectable in upper respiratory specimens during the acute phase of infection. The lowest concentration of SARS-CoV-2 viral copies this assay can detect is 138 copies/mL. A negative result does not preclude SARS-Cov-2 infection and should not be used as the sole basis for treatment or other patient management decisions. A negative result may occur with  improper specimen collection/handling, submission of specimen other than nasopharyngeal swab, presence of viral mutation(s) within the areas targeted by this assay, and inadequate number of viral copies(<138 copies/mL). A negative result must be combined with clinical observations, patient history, and epidemiological information. The expected result is Negative.  Fact Sheet for Patients:  EntrepreneurPulse.com.au  Fact Sheet for Healthcare Providers:  IncredibleEmployment.be  This test is no t yet approved or cleared by the [place] FDA and  has been authorized for detection and/or diagnosis of SARS-CoV-2 by FDA under an Emergency Use Authorization (EUA). This EUA will remain  in effect (meaning this test can be used) for the duration of the COVID-19 declaration under Section 564(b)(1) of the Act, 21 U.S.C.section 360bbb-3(b)(1), unless the authorization is terminated  or revoked sooner.       Influenza A by PCR NEGATIVE NEGATIVE Final   Influenza B by PCR NEGATIVE NEGATIVE Final    Comment: (NOTE) The Xpert Xpress SARS-CoV-2/FLU/RSV plus assay is intended as an aid in the diagnosis of influenza from Nasopharyngeal swab specimens and should not be used as a sole basis for treatment. Nasal washings and aspirates are unacceptable for Xpert Xpress SARS-CoV-2/FLU/RSV testing.  Fact Sheet for Patients: EntrepreneurPulse.com.au  Fact Sheet for Healthcare  Providers: IncredibleEmployment.be  This test is not yet approved or cleared by the [place] FDA and has been authorized for detection and/or diagnosis of SARS-CoV-2 by FDA under an Emergency Use Authorization (EUA). This EUA will remain in effect (meaning this test can be used) for the duration of the COVID-19 declaration under Section 564(b)(1) of the Act, 21 U.S.C. section 360bbb-3(b)(1), unless the authorization is terminated or revoked.  Performed at [facility], [place] [address], [place], [place] [address]   MRSA Next Gen by  PCR, Nasal     Status: None   Collection Time: 09/03/21 10:04 PM   Specimen: Nasal Mucosa; Nasal Swab  Result Value Ref Range Status   MRSA by PCR Next Gen NOT DETECTED NOT DETECTED Final    Comment: (NOTE) The GeneXpert MRSA Assay (FDA approved for NASAL specimens only), is one component of a comprehensive MRSA colonization surveillance program. It is not intended to diagnose MRSA infection nor to guide or monitor treatment for MRSA infections. Test performance is not FDA approved in patients less than 70 years old. Performed at [facility], [place] [address] [place], [place] [address]      Medications:    acetaminophen  1,000 mg Oral Q6H   atorvastatin  40 mg Oral QHS   carvedilol  3.125 mg Oral BID WC   citalopram  20 mg Oral Daily   fentaNYL (SUBLIMAZE) injection  25 mcg Intravenous Once   furosemide  40 mg Oral Daily   insulin aspart  0-15 Units Subcutaneous TID WC   insulin aspart  0-5 Units Subcutaneous QHS   insulin aspart  3 Units Subcutaneous TID WC   insulin detemir  10 Units Subcutaneous BID   metaxalone  400 mg Oral TID   oxybutynin  15 mg Oral Daily   pantoprazole  40 mg Oral Daily   polyethylene glycol  17 g Oral BID   Continuous Infusions:    LOS: 2 days   Charlynne Cousins  [facility]  09/06/2021, 8:44 AM

## 2021-09-07 DIAGNOSIS — I4892 Unspecified atrial flutter: Secondary | ICD-10-CM | POA: Diagnosis not present

## 2021-09-07 DIAGNOSIS — I5032 Chronic diastolic (congestive) heart failure: Secondary | ICD-10-CM | POA: Diagnosis not present

## 2021-09-07 DIAGNOSIS — J942 Hemothorax: Secondary | ICD-10-CM | POA: Diagnosis not present

## 2021-09-07 DIAGNOSIS — G4733 Obstructive sleep apnea (adult) (pediatric): Secondary | ICD-10-CM | POA: Diagnosis not present

## 2021-09-07 LAB — CBC
HCT: 25.1 % — ABNORMAL LOW (ref 36.0–46.0)
Hemoglobin: 8 g/dL — ABNORMAL LOW (ref 12.0–15.0)
MCH: 28.7 pg (ref 26.0–34.0)
MCHC: 31.9 g/dL (ref 30.0–36.0)
MCV: 90 fL (ref 80.0–100.0)
Platelets: 301 10*3/uL (ref 150–400)
RBC: 2.79 MIL/uL — ABNORMAL LOW (ref 3.87–5.11)
RDW: 16.9 % — ABNORMAL HIGH (ref 11.5–15.5)
WBC: 14 10*3/uL — ABNORMAL HIGH (ref 4.0–10.5)
nRBC: 0.2 % (ref 0.0–0.2)

## 2021-09-07 LAB — TYPE AND SCREEN
ABO/RH(D): A POS
Antibody Screen: NEGATIVE
Unit division: 0

## 2021-09-07 LAB — GLUCOSE, CAPILLARY
Glucose-Capillary: 152 mg/dL — ABNORMAL HIGH (ref 70–99)
Glucose-Capillary: 174 mg/dL — ABNORMAL HIGH (ref 70–99)
Glucose-Capillary: 158 mg/dL — ABNORMAL HIGH (ref 70–99)
Glucose-Capillary: 146 mg/dL — ABNORMAL HIGH (ref 70–99)

## 2021-09-07 LAB — PREPARE RBC (CROSSMATCH)

## 2021-09-07 LAB — BPAM RBC
Blood Product Expiration Date: 202212132359
ISSUE DATE / TIME: 202211191121
Unit Type and Rh: 6200

## 2021-09-07 MED ORDER — SODIUM CHLORIDE 0.9% IV SOLUTION: Freq: Once | INTRAVENOUS | Status: AC

## 2021-09-07 NOTE — Progress Notes (Signed)
Occupational Therapy Treatment Patient Details Name: Linda Washington MRN: [id number] DOB: 14-Apr-1951 Today's Date: 09/07/2021   History of present illness 70 y/o female presented to ED on 11/16 following a fall with severe L back/flank pain with large hematoma. CT head negative. CT chest/abdomen showed displaced fx of posterior L 8-11 ribs, moderate L hemothorax with partial consolidative changes of L lower lobe, possibly tiny L apical pneumothorax, large hematoma in soft tissues of L lateral chest wall and L flank. PMH: diabetes, HLD, Atrial flutter on Coumadin, HTN, CHF, neuropathy, sleep apnea   OT comments  This 70 yo female admitted with above presents to acute OT making great progress towards goals, introduced AE this session (sock aid and toilet aid)--pt to decide if she needs them and will get on her own if does.Pt will continue to benefit from acute OT with follow up [place].   Recommendations for follow up therapy are one component of a multi-disciplinary discharge planning process, led by the attending physician.  Recommendations may be updated based on patient status, additional functional criteria and insurance authorization.    Follow Up Recommendations  Home health OT    Assistance Recommended at Discharge Intermittent Supervision/Assistance  Equipment Recommendations  None recommended by OT (pt will buy or borrow a shower seat, she has a toilet riser at home)       Precautions / Restrictions Precautions Precautions: Fall       Mobility Bed Mobility               General bed mobility comments: Pt up in recliner upon my arrival    Transfers Overall transfer level: Needs assistance Equipment used: Rolling walker (2 wheels) Transfers: Sit to/from Stand Sit to Stand: Supervision                 Balance Overall balance assessment: History of Falls;Mild deficits observed, not formally tested                                         ADL  either performed or assessed with clinical judgement   ADL Overall ADL's : Needs assistance/impaired                     Lower Body Dressing: Set up;Supervision/safety;Sit to/from stand Lower Body Dressing Details (indicate cue type and reason): use of sock aid to donn socks and reacher to doff socks, can get pants on without AE Toilet Transfer: Supervision/safety;Ambulation;BSC/3in1 Armed forces technical officer Details (indicate cue type and reason): over toilet Toileting- Clothing Manipulation and Hygiene: Moderate assistance Toileting - Clothing Manipulation Details (indicate cue type and reason): can do front peri care, but not back peri care due to pain from rib fractures and hematoma, educated on use of toilet aid and demonstrated to her how to apply wet wipe to it and positioning of the toilet aid in her hand       General ADL Comments: Educated on ordering AE on line or getting from a local medical store    Extremity/Trunk Assessment Upper Extremity Assessment Upper Extremity Assessment: Overall WFL for tasks assessed            Vision Patient Visual Report: No change from baseline            Cognition Arousal/Alertness: Awake/alert Behavior During Therapy: WFL for tasks assessed/performed Overall Cognitive Status: Within Functional Limits for tasks assessed  Pertinent Vitals/ Pain       Pain Assessment: 0-10 Pain Score: 4  Pain Location: L flank Pain Descriptors / Indicators: Grimacing;Sore (sharp intermittently) Pain Intervention(s): Limited activity within patient's tolerance;Monitored during session;Repositioned         Frequency  Min 2X/week        Progress Toward Goals  OT Goals(current goals can now be found in the care plan section)  Progress towards OT goals: Progressing toward goals  Acute Rehab OT Goals Patient Stated Goal: to be able do back peri care OT Goal  Formulation: With patient Time For Goal Achievement: 09/18/21 Potential to Achieve Goals: Good  Plan Discharge plan remains appropriate;Equipment recommendations need to be updated       AM-PAC OT "6 Clicks" Daily Activity     Outcome Measure   Help from another person eating meals?: None Help from another person taking care of personal grooming?: A Little Help from another person toileting, which includes using toliet, bedpan, or urinal?: A Little Help from another person bathing (including washing, rinsing, drying)?: A Little Help from another person to put on and taking off regular upper body clothing?: A Little Help from another person to put on and taking off regular lower body clothing?: A Little 6 Click Score: 19    End of Session Equipment Utilized During Treatment: Rolling walker (2 wheels) (sock aid, toilet aid)  OT Visit Diagnosis: Unsteadiness on feet (R26.81);Muscle weakness (generalized) (M62.81);History of falling (Z91.81);Pain Pain - Right/Left: Left Pain - part of body:  (flank, posteior ribs)   Activity Tolerance Patient tolerated treatment well   Patient Left in chair;with call bell/phone within reach;with chair alarm set   Nurse Communication  (IV complete)        Time: 1310-1336 OT Time Calculation (min): 26 min  Charges: OT General Charges $OT Visit: 1 Visit OT Treatments $Self Care/Home Management : 23-37 mins  [place] [address], OTR/L Acute [employer] Pager [phone] Office [phone]   Almon Register 09/07/2021, 2:18 PM

## 2021-09-07 NOTE — Progress Notes (Signed)
[facility] PROGRESS NOTE    Progress Note  Linda Washington  BDZ:[id number] DOB: Aug 27, 1951 DOA: 09/03/2021 PCP: Marda Stalker, PA-C     Brief Narrative:   Linda Washington is an 70 y.o. female past medical history significant for chronic diastolic heart failure, atrial fibrillation on Coumadin, struct of sleep apnea not on CPAP diabetes mellitus type 2 morbid obesity who presents after a fall, there was no loss of consciousness during tripped over a rug and fell on her left side, in the ED she had a white count of 19 hemoglobin of 10 (back in 2021 her hemoglobin was 12.4) CT of the chest abdomen and pelvis showed a left ninth 10th and 11th rib fracture the 10th and 11 ribs are displaced in 2 places, there is left effusion (probably hemothorax), tiny left apical pneumothorax and a large left hematoma of the soft tissues of the lateral chest wall, she was given vitamin K in the ED trauma surgery was consulted with and recommended [facility]    Assessment/Plan:   Left Hemothorax and chest wall hematoma due to coumadin with left ninth, 10th and 11th rib fracture/acute blood loss anemia: Trauma on board and appreciate assistance.  Related distal take about 6 to 8 weeks to heal. She can be started back on anticoagulation in 2 weeks after discharge. Pain is controlled Physical therapy evaluated the patient and recommended home health PT. Hemoglobin of at 7.3, I discussed the risk and benefits with the patient and she agreed to 1 unit of packed red blood cells.  Acute respiratory failure with hypoxia: Multifactorial due to rib fracture and OSA, now wn room air this morning, overnight had to be placed on 2 L as she became hypoxic. Anything above 88% is good on her. She has a known history of obstructive sleep apnea with noncompliance with her CPAP she relates she does not like it.  Hypotension: Continue to hold ARB, resume Lasix and continue Coreg. She will need to be discharged off  her ARB and follow-up with PCP as an outpatient.  atrial fibrillation rate controlled:  Holding Coumadin continue Coreg for rate control. INR this morning is 1.2.  Will need to be off Coumadin for at least 2 weeks.  Chronic diastolic heart failure: Continue Coreg and Lasix will, continue to hold ACE inhibitor better, blood pressure stable  Normocytic anemia acute blood loss anemia: Likely due to left hemothorax and large left-sided hematoma. Hemoglobin this morning 7.3 I discussed the risk and benefits with the patient and she agreed to 1 unit of packed red blood cells. She relates she feels short of breath when she ambulates and tired and fatigue probably due to the sudden drop in her hemoglobin.  Diabetes mellitus type 2 with hyperglycemia: A1c of 7.7, remains high continue sliding scale increase long-acting insulin.  Obstructive sleep apnea: Noncompliant with her CPAP at night expect her she will become hypoxic overnight due to her obstructive sleep apnea. Try to keep her on room air during the day.   DVT prophylaxis: SCD Family Communication:none Status is: Observation  The patient will require care spanning > 2 midnights and should be moved to inpatient because:       Code Status:     Code Status Orders  (From admission, onward)           Start     Ordered   09/03/21 2108  Full code  Continuous        09/03/21 2109  Code Status History     Date Active Date Inactive Code Status Order ID Comments User Context   06/20/2018 1521 06/23/2018 1651 DNR [id number]  Karmen Bongo, MD ED   02/01/2017 0842 02/01/2017 2301 Full Code [id number]  Ahmed Prima Fransisco Hertz, PA-C ED   04/08/2015 1218 04/10/2015 1848 Full Code [id number]  Frederik Pear, MD Inpatient         IV Access:   Peripheral IV   Procedures and diagnostic studies:   No results found.   Medical Consultants:   None.   Subjective:    [name] tired and fatigue, feels short of  breath with exacerbation.  Objective:    Vitals:   09/07/21 0000 09/07/21 0016 09/07/21 0351 09/07/21 0733  BP:   (!) 111/51 (!) 109/41  Pulse:   61 69  Resp: 20 19 18 15   Temp:   98.6 F (37 C) 98.5 F (36.9 C)  TempSrc:   Oral Oral  SpO2:   92% 94%  Weight:      Height:       SpO2: 94 % O2 Flow Rate (L/min): 2 L/min   Intake/Output Summary (Last 24 hours) at 09/07/2021 0947 Last data filed at 09/07/2021 0800 Gross per 24 hour  Intake 1050.33 ml  Output 325 ml  Net 725.33 ml    Filed Weights   09/03/21 1612  Weight: 127.9 kg    Exam: General exam: In no acute distress. Respiratory system: Good air movement and clear to auscultation. Cardiovascular system: S1 & S2 heard, RRR. No JVD. Gastrointestinal system: Abdomen is nondistended, soft and nontender.  Extremities: No pedal edema. Skin: No rashes, lesions or ulcers Psychiatry: Judgement and insight appear normal. Mood & affect appropriate.   Data Reviewed:    Labs: Basic Metabolic Panel: Recent Labs  Lab 09/03/21 1632  NA 138  K 4.1  CL 104  CO2 22  GLUCOSE 219*  BUN 15  CREATININE 0.87  CALCIUM 8.3*    GFR Estimated Creatinine Clearance: 82.6 mL/min (by C-G formula based on SCr of 0.87 mg/dL). Liver Function Tests: Recent Labs  Lab 09/03/21 1632  AST 19  ALT 14  ALKPHOS 63  BILITOT 0.9  PROT 5.7*  ALBUMIN 2.8*    No results for input(s): LIPASE, AMYLASE in the last 168 hours. No results for input(s): AMMONIA in the last 168 hours. Coagulation profile Recent Labs  Lab 09/03/21 1632 09/04/21 0245  INR 3.2* 1.2    COVID-19 Labs  No results for input(s): DDIMER, FERRITIN, LDH, CRP in the last 72 hours.  Lab Results  Component Value Date   [employer] NEGATIVE 09/03/2021    CBC: Recent Labs  Lab 09/03/21 1632 09/03/21 2103 09/04/21 0245 09/05/21 1030 09/06/21 0847 09/06/21 1711  WBC 19.1*  --  14.5* 17.8* 17.8* 16.6*  NEUTROABS 16.6*  --   --  13.6* 13.7*  --    HGB 10.4* 9.8* 9.0* 7.7* 7.3* 7.3*  HCT 32.1* 30.3* 28.5* 23.4* 23.2* 23.2*  MCV 90.4  --  90.8 89.7 91.3 91.7  PLT 334  --  333 305 341 306    Cardiac Enzymes: No results for input(s): CKTOTAL, CKMB, CKMBINDEX, TROPONINI in the last 168 hours. BNP (last 3 results) No results for input(s): PROBNP in the last 8760 hours. CBG: Recent Labs  Lab 09/06/21 0754 09/06/21 1127 09/06/21 1655 09/06/21 2101 09/07/21 0729  GLUCAP 197* 207* 134* 220* 152*    D-Dimer: No results for input(s): DDIMER in the last 72 hours.  Hgb A1c: No results for input(s): HGBA1C in the last 72 hours.  Lipid Profile: No results for input(s): CHOL, HDL, LDLCALC, TRIG, CHOLHDL, LDLDIRECT in the last 72 hours. Thyroid function studies: No results for input(s): TSH, T4TOTAL, T3FREE, THYROIDAB in the last 72 hours.  Invalid input(s): FREET3 Anemia work up: No results for input(s): VITAMINB12, FOLATE, FERRITIN, TIBC, IRON, RETICCTPCT in the last 72 hours. Sepsis Labs: Recent Labs  Lab 09/04/21 0245 09/05/21 1030 09/06/21 0847 09/06/21 1711  WBC 14.5* 17.8* 17.8* 16.6*    Microbiology Recent Results (from the past 240 hour(s))  Resp Panel by RT-PCR (Flu A&B, Covid) Nasopharyngeal Swab     Status: None   Collection Time: 09/03/21  7:47 PM   Specimen: Nasopharyngeal Swab; Nasopharyngeal(NP) swabs in vial transport medium  Result Value Ref Range Status   SARS Coronavirus 2 by RT PCR NEGATIVE NEGATIVE Final    Comment: (NOTE) SARS-CoV-2 target nucleic acids are NOT DETECTED.  The SARS-CoV-2 RNA is generally detectable in upper respiratory specimens during the acute phase of infection. The lowest concentration of SARS-CoV-2 viral copies this assay can detect is 138 copies/mL. A negative result does not preclude SARS-Cov-2 infection and should not be used as the sole basis for treatment or other patient management decisions. A negative result may occur with  improper specimen collection/handling,  submission of specimen other than nasopharyngeal swab, presence of viral mutation(s) within the areas targeted by this assay, and inadequate number of viral copies(<138 copies/mL). A negative result must be combined with clinical observations, patient history, and epidemiological information. The expected result is Negative.  Fact Sheet for Patients:  EntrepreneurPulse.com.au  Fact Sheet for Healthcare Providers:  IncredibleEmployment.be  This test is no t yet approved or cleared by the [place] FDA and  has been authorized for detection and/or diagnosis of SARS-CoV-2 by FDA under an Emergency Use Authorization (EUA). This EUA will remain  in effect (meaning this test can be used) for the duration of the COVID-19 declaration under Section 564(b)(1) of the Act, 21 U.S.C.section 360bbb-3(b)(1), unless the authorization is terminated  or revoked sooner.       Influenza A by PCR NEGATIVE NEGATIVE Final   Influenza B by PCR NEGATIVE NEGATIVE Final    Comment: (NOTE) The Xpert Xpress SARS-CoV-2/FLU/RSV plus assay is intended as an aid in the diagnosis of influenza from Nasopharyngeal swab specimens and should not be used as a sole basis for treatment. Nasal washings and aspirates are unacceptable for Xpert Xpress SARS-CoV-2/FLU/RSV testing.  Fact Sheet for Patients: EntrepreneurPulse.com.au  Fact Sheet for Healthcare Providers: IncredibleEmployment.be  This test is not yet approved or cleared by the [place] FDA and has been authorized for detection and/or diagnosis of SARS-CoV-2 by FDA under an Emergency Use Authorization (EUA). This EUA will remain in effect (meaning this test can be used) for the duration of the COVID-19 declaration under Section 564(b)(1) of the Act, 21 U.S.C. section 360bbb-3(b)(1), unless the authorization is terminated or revoked.  Performed at [facility], [place] [address] [place],  [address]   MRSA Next Gen by PCR, Nasal     Status: None   Collection Time: 09/03/21 10:04 PM   Specimen: Nasal Mucosa; Nasal Swab  Result Value Ref Range Status   MRSA by PCR Next Gen NOT DETECTED NOT DETECTED Final    Comment: (NOTE) The GeneXpert MRSA Assay (FDA approved for NASAL specimens only), is one component of a comprehensive MRSA colonization surveillance program. It is not intended  to diagnose MRSA infection nor to guide or monitor treatment for MRSA infections. Test performance is not FDA approved in patients less than 68 years old. Performed at [facility], [place] [address], [place], [place] [address]      Medications:    acetaminophen  1,000 mg Oral Q6H   atorvastatin  40 mg Oral QHS   carvedilol  3.125 mg Oral BID WC   citalopram  20 mg Oral Daily   fentaNYL (SUBLIMAZE) injection  25 mcg Intravenous Once   furosemide  40 mg Oral Daily   insulin aspart  0-15 Units Subcutaneous TID WC   insulin aspart  0-5 Units Subcutaneous QHS   insulin aspart  3 Units Subcutaneous TID WC   insulin detemir  10 Units Subcutaneous BID   metaxalone  400 mg Oral TID   oxybutynin  15 mg Oral Daily   pantoprazole  40 mg Oral Daily   Continuous Infusions:    LOS: 3 days   Charlynne Cousins  [facility]  09/07/2021, 9:47 AM

## 2021-09-08 DIAGNOSIS — I5032 Chronic diastolic (congestive) heart failure: Secondary | ICD-10-CM | POA: Diagnosis not present

## 2021-09-08 DIAGNOSIS — I4892 Unspecified atrial flutter: Secondary | ICD-10-CM | POA: Diagnosis not present

## 2021-09-08 DIAGNOSIS — J942 Hemothorax: Secondary | ICD-10-CM | POA: Diagnosis not present

## 2021-09-08 DIAGNOSIS — S2242XA Multiple fractures of ribs, left side, initial encounter for closed fracture: Secondary | ICD-10-CM | POA: Diagnosis not present

## 2021-09-08 LAB — TYPE AND SCREEN
ABO/RH(D): A POS
Antibody Screen: NEGATIVE
Unit division: 0

## 2021-09-08 LAB — BPAM RBC
Blood Product Expiration Date: 202211232359
ISSUE DATE / TIME: 202211201059
Unit Type and Rh: 6200

## 2021-09-08 LAB — GLUCOSE, CAPILLARY
Glucose-Capillary: 135 mg/dL — ABNORMAL HIGH (ref 70–99)
Glucose-Capillary: 145 mg/dL — ABNORMAL HIGH (ref 70–99)

## 2021-09-08 MED ORDER — WARFARIN SODIUM 5 MG PO TABS: 7.5000 mg | ORAL_TABLET | ORAL | Status: DC

## 2021-09-08 MED ORDER — TRAMADOL HCL 50 MG PO TABS: 50.0000 mg | ORAL_TABLET | Freq: Four times a day (QID) | ORAL | 0 refills | Status: DC | PRN

## 2021-09-08 NOTE — Progress Notes (Signed)
Physical Therapy Treatment Patient Details Name: Linda Washington MRN: [id number] DOB: Dec 13, 1950 Today's Date: 09/08/2021   History of Present Illness 70 y/o female presented to ED on 11/16 following a fall with severe L back/flank pain with large hematoma. CT head negative. CT chest/abdomen showed displaced fx of posterior L 8-11 ribs, moderate L hemothorax with partial consolidative changes of L lower lobe, possibly tiny L apical pneumothorax, large hematoma in soft tissues of L lateral chest wall and L flank. PMH: diabetes, HLD, Atrial flutter on Coumadin, HTN, CHF, neuropathy, sleep apnea    PT Comments    Pt eager to d/c home today. Pt practiced steps proficiently, demonstrating ability to enter home at d/c.  Pt also ambulatory with RW with supervision only assist, does require occasional safety cues with mobility but will have family to assist at home. Pt requiring 2lO2 to maintain SpO2, MD notified. Appropriate to d/c home from a PT standpoint.   SATURATION QUALIFICATIONS: (This note is used to comply with regulatory documentation for home oxygen)  Patient Saturations on Room Air at Rest = 91%  Patient Saturations on Room Air while Ambulating = 81%  Patient Saturations on 2 Liters of oxygen while Ambulating = 92%  Please briefly explain why patient needs home oxygen:to maintain SpO2 >88%    Recommendations for follow up therapy are one component of a multi-disciplinary discharge planning process, led by the attending physician.  Recommendations may be updated based on patient status, additional functional criteria and insurance authorization.  Follow Up Recommendations  Home health PT     Assistance Recommended at Discharge    Equipment Recommendations  Rolling walker (2 wheels)    Recommendations for Other Services       Precautions / Restrictions Precautions Precautions: Fall Precaution Comments: watch O2 Restrictions Weight Bearing Restrictions: No      Mobility  Bed Mobility Overal bed mobility: Needs Assistance Bed Mobility: Supine to Sit     Supine to sit: Supervision;HOB elevated     General bed mobility comments: for safety, pt using bedrails to pull trunk to upright. Pt plans to sleep in recliner at home until her L flank feels better    Transfers Overall transfer level: Needs assistance Equipment used: Rolling walker (2 wheels) Transfers: Sit to/from Stand Sit to Stand: Supervision           General transfer comment: for safety, PT managing lines/leads    Ambulation/Gait Ambulation/Gait assistance: Supervision Gait Distance (Feet): 90 Feet (x2 - to and from stairwell) Assistive device: Rolling walker (2 wheels) Gait Pattern/deviations: Step-through pattern;Decreased stride length;Trunk flexed Gait velocity: decr     General Gait Details: cues for upright posture, watching lines/leads. Seated rest break to recover fatigue. DOE 2/4 with SpO2 drop to 81% on RA, placed on 2LO2 for recovery to 90%.   Stairs Stairs: Yes Stairs assistance: Supervision Stair Management: One rail Left;Forwards;Step to pattern Number of Stairs: 5 General stair comments: cues for step-to gait, taking her time   Wheelchair Mobility    Modified Rankin (Stroke Patients Only)       Balance Overall balance assessment: Needs assistance;History of Falls Sitting-balance support: No upper extremity supported;Feet supported Sitting balance-Leahy Scale: Good     Standing balance support: Bilateral upper extremity supported;During functional activity Standing balance-Leahy Scale: Poor Standing balance comment: reliant on UE support                            Cognition  Arousal/Alertness: Awake/alert Behavior During Therapy: WFL for tasks assessed/performed Overall Cognitive Status: No family/caregiver present to determine baseline cognitive functioning                                 General Comments:  pt requires safety cues for line/leads management, rest breaks to recover fatigue        Exercises      General Comments General comments (skin integrity, edema, etc.): abd binder donned      Pertinent Vitals/Pain Pain Assessment: Faces Faces Pain Scale: Hurts little more Pain Location: L flank and chest wall Pain Descriptors / Indicators: Grimacing;Sore Pain Intervention(s): Limited activity within patient's tolerance;Monitored during session;Patient requesting pain meds-RN notified    Home Living                          Prior Function            PT Goals (current goals can now be found in the care plan section) Acute Rehab PT Goals Patient Stated Goal: to reduce pain PT Goal Formulation: With patient Time For Goal Achievement: 09/18/21 Potential to Achieve Goals: Good Progress towards PT goals: Progressing toward goals    Frequency    Min 3X/week      PT Plan Current plan remains appropriate    Co-evaluation              AM-PAC PT "6 Clicks" Mobility   Outcome Measure  Help needed turning from your back to your side while in a flat bed without using bedrails?: A Little Help needed moving from lying on your back to sitting on the side of a flat bed without using bedrails?: A Little Help needed moving to and from a bed to a chair (including a wheelchair)?: A Little Help needed standing up from a chair using your arms (e.g., wheelchair or bedside chair)?: A Little Help needed to walk in hospital room?: A Little Help needed climbing 3-5 steps with a railing? : A Little 6 Click Score: 18    End of Session Equipment Utilized During Treatment: Gait belt Activity Tolerance: Patient limited by pain;Patient limited by fatigue Patient left: in chair;with call bell/phone within reach;with chair alarm set Nurse Communication: Mobility status PT Visit Diagnosis: Unsteadiness on feet (R26.81);Muscle weakness (generalized) (M62.81);History of falling  (Z91.81)     Time: 4970-2637 PT Time Calculation (min) (ACUTE ONLY): 30 min  Charges:  $Gait Training: 23-37 mins                    Stacie Glaze, PT DPT Acute Rehabilitation Services Pager [phone]  Office [phone]    [place] 09/08/2021, 10:09 AM

## 2021-09-08 NOTE — Discharge Summary (Signed)
Physician Discharge Summary  Linda Washington KZS:[id number] DOB: Apr 12, 1951 DOA: 09/03/2021  PCP: Marda Stalker, PA-C  Admit date: 09/03/2021 Discharge date: 09/08/2021  Admitted From: home Disposition:  Home  Recommendations for Outpatient Follow-up:  Follow up with PCP in 1-2 weeks follow-up on her blood pressure and resume her ARB as tolerated. Please obtain BMP/CBC in one week Will need to follow-up with PCP in 2 weeks PCP or Coumadin as an outpatient in 2 weeks.  Home Health:Yes Equipment/Devices:None  Discharge Condition:Stable CODE STATUS:Full Diet recommendation: Heart Healthy   Brief/Interim Summary: 70 y.o. female past medical history significant for chronic diastolic heart failure, atrial fibrillation on Coumadin, struct of sleep apnea not on CPAP diabetes mellitus type 2 morbid obesity who presents after a fall, there was no loss of consciousness during tripped over a rug and fell on her left side, in the ED she had a white count of 19 hemoglobin of 10 (back in 2021 her hemoglobin was 12.4) CT of the chest abdomen and pelvis showed a left ninth 10th and 11th rib fracture the 10th and 11 ribs are displaced in 2 places, there is left effusion (probably hemothorax), tiny left apical pneumothorax and a large left hematoma of the soft tissues of the lateral chest wall, she was given vitamin K in the ED trauma surgery was consulted with and recommended K[facility]ra  Discharge Diagnoses:  Principal Problem:   Hemothorax Active Problems:   Diabetes mellitus ([place])   OSA (obstructive sleep apnea)   Atrial flutter with controlled response ([place])   Long term (current) use of anticoagulants   Chronic heart failure (HCC)   Rib fracture   Hemothorax on left  Left hemothorax and chest wall hematoma due to to Coumadin with a left ninth, 10th and 11th rib fracture/acute blood loss anemia: Trauma was consulted her INR was reversed with vitamin K and Concentra. Rib fractures will take  about 6 to 8 weeks to heal. She should be off anticoagulation for 2 weeks. She relates her pain is controlled has been well controlled on tramadol Physical therapy evaluated the patient and recommended home health PT. She was transfused 1 unit of packed red blood cells since then her hemoglobin has remained stable.  Acute respiratory failure with hypoxia: Multifactorial in the setting of rib fracture and noncompliance with her CPAP due to obstructive sleep apnea. She was weaned to room air. She will go home without oxygen.  Hypotension/essential hypertension: All of her antihypertensive medications were held. She was restarted back on her Lasix and Coreg her primary care doctor to evaluate and restart her back on an ARB as an outpatient.  Chronic atrial fibrillation: Rate controlled on Coreg, her INR was reversed due to recent trauma. She will resume Coumadin in 2 weeks.  Chronic diastolic heart failure: She will continue Coreg and Lasix ACE inhibitor was held due to borderline blood pressure she will follow-up with her PCP and restarted as an outpatient.  Normocytic anemia/acute blood loss anemia: Likely due to left hemothorax and left large sided hematoma, her hemoglobin drifted down to 7.3 she was transfused 1 unit of packed red blood cells her hemoglobin has remained stable at 8.  Diabetes mellitus type 2 with hyperglycemia: Her A1c was 7.7, no change made to her medication follow-up with PCP.  Obstructive sleep apnea: Noncompliant with her CPAP at night she will if she cannot tolerate it she becomes hypoxic overnight.  Discharge Instructions  Discharge Instructions     Diet - low sodium heart healthy  Complete by: As directed    Increase activity slowly   Complete by: As directed       Allergies as of 09/08/2021       Reactions   Cyclobenzaprine Other (See Comments)   Gabapentin Other (See Comments)   Hydrocodone Other (See Comments)   Weird sensations mentally;  "makes me fuzzy"   Methocarbamol Hives   Oxycodone Other (See Comments)   Weird sensations mentally; "makes me fuzzy"   Tinidazole    Other reaction(s): hives   Latex Hives, Rash        Medication List     STOP taking these medications    cetirizine 10 MG tablet Commonly known as: ZYRTEC   enalapril 10 MG tablet Commonly known as: VASOTEC       TAKE these medications    acetaminophen 650 MG CR tablet Commonly known as: TYLENOL Take 650 mg by mouth every 8 (eight) hours as needed for pain.   atorvastatin 40 MG tablet Commonly known as: LIPITOR TAKE 1 TABLET (40 MG TOTAL) BY MOUTH AT BEDTIME.   carvedilol 3.125 MG tablet Commonly known as: COREG TAKE 1 TABLET BY MOUTH 2 TIMES DAILY WITH MEALS   citalopram 20 MG tablet Commonly known as: CELEXA Take 20 mg by mouth daily.   fluticasone 50 MCG/ACT nasal spray Commonly known as: FLONASE Place 1-2 sprays into both nostrils daily as needed for allergies or rhinitis.   furosemide 40 MG tablet Commonly known as: LASIX Take 1 tablet (40 mg total) by mouth daily.   metFORMIN 1000 MG tablet Commonly known as: GLUCOPHAGE Take 1,000 mg by mouth 2 (two) times daily with a meal.   NovoLOG FlexPen 100 UNIT/ML FlexPen Generic drug: insulin aspart Inject 4 Units into the skin daily as needed. If Blood Sugar>200=Take 4 units   omeprazole 20 MG capsule Commonly known as: PRILOSEC Take 20 mg by mouth daily.   oxybutynin 15 MG 24 hr tablet Commonly known as: DITROPAN XL Take 15 mg by mouth daily.   SYSTANE OP Place 1-2 drops into both eyes daily as needed (dry eyes).   traMADol 50 MG tablet Commonly known as: ULTRAM Take 1-2 tablets (50-100 mg total) by mouth every 6 (six) hours as needed for severe pain.   Trulicity 8.10 FB/5.1WC Sopn Generic drug: Dulaglutide Inject 0.75 mg into the skin once a week. Take on Wed   warfarin 5 MG tablet Commonly known as: COUMADIN Take as directed. If you are unsure how to take  this medication, talk to your nurse or doctor. Original instructions: Take 1.5-2 tablets (7.5-10 mg total) by mouth as directed. Take 1.5 tablets (7.5 mg) on MWF & Take 2 tablets (10 mg) all other days AS DIRECTED BY COUMADIN CLINIC Start taking on: September 23, 2021 What changed:  how much to take how to take this when to take this additional instructions These instructions start on September 23, 2021. If you are unsure what to do until then, ask your doctor or other care provider.        Allergies  Allergen Reactions   Cyclobenzaprine Other (See Comments)   Gabapentin Other (See Comments)   Hydrocodone Other (See Comments)    Weird sensations mentally; "makes me fuzzy"   Methocarbamol Hives   Oxycodone Other (See Comments)    Weird sensations mentally; "makes me fuzzy"   Tinidazole     Other reaction(s): hives   Latex Hives and Rash    Consultations: Trauma service   Procedures/Studies: CT HEAD  WO CONTRAST (5MM)  Result Date: 09/03/2021 CLINICAL DATA:  Head trauma, minor (Age >= 65y) Fall with head and neck pain. EXAM: CT HEAD WITHOUT CONTRAST TECHNIQUE: Contiguous axial images were obtained from the base of the skull through the vertex without intravenous contrast. COMPARISON:  None. FINDINGS: Brain: No intracranial hemorrhage, mass effect, or midline shift. No hydrocephalus. The basilar cisterns are patent. No evidence of territorial infarct or acute ischemia. No extra-axial or intracranial fluid collection. Vascular: No hyperdense vessel or unexpected calcification. Skull: No fracture or focal lesion.  Frontal hyperostosis. Sinuses/Orbits: Paranasal sinuses and mastoid air cells are clear. The visualized orbits are unremarkable. Bilateral cataract resection. Other: None.  No confluent scalp hematoma. IMPRESSION: No acute intracranial abnormality. No skull fracture. Electronically Signed   By: Keith Rake M.D.   On: 09/03/2021 19:34   CT Cervical Spine Wo  Contrast  Result Date: 09/03/2021 CLINICAL DATA:  Neck trauma (Age >= 65y) Post fall with head and neck pain. EXAM: CT CERVICAL SPINE WITHOUT CONTRAST TECHNIQUE: Multidetector CT imaging of the cervical spine was performed without intravenous contrast. Multiplanar CT image reconstructions were also generated. COMPARISON:  None. FINDINGS: Alignment: Normal. Skull base and vertebrae: No acute fracture. Vertebral body heights are maintained. The dens and skull base are intact. Soft tissues and spinal canal: No prevertebral fluid or swelling. No visible canal hematoma. Disc levels: Disc space narrowing and anterior spurring C4-C5 through C6-C7. There is multilevel facet hypertrophy. Upper chest: Left pleural effusion. Assessed on concurrent chest CT, reported separately. Other: None. IMPRESSION: Degenerative change in the cervical spine without acute fracture or subluxation. Electronically Signed   By: Keith Rake M.D.   On: 09/03/2021 19:38   DG Pelvis Portable  Result Date: 09/03/2021 CLINICAL DATA:  Fall EXAM: PORTABLE PELVIS 1 VIEW COMPARISON:  None. FINDINGS: Evaluation is limited by body habitus and single view. Within this limitation, there is no evidence of pelvic fracture or diastasis. No pelvic bone lesions are seen. IMPRESSION: Negative. Electronically Signed   By: Merilyn Baba M.D.   On: 09/03/2021 17:14   CT CHEST ABDOMEN PELVIS W CONTRAST  Result Date: 09/03/2021 CLINICAL DATA:  Trauma and left flank pain. EXAM: CT CHEST, ABDOMEN, AND PELVIS WITH CONTRAST TECHNIQUE: Multidetector CT imaging of the chest, abdomen and pelvis was performed following the standard protocol during bolus administration of intravenous contrast. CONTRAST:  1100mL OMNIPAQUE IOHEXOL 350 MG/ML SOLN COMPARISON:  CT of the abdomen pelvis dated 02/01/2018. Chest radiograph dated 09/03/2021. Five FINDINGS: CT CHEST FINDINGS Cardiovascular: No cardiomegaly or pericardial effusion. The thoracic aorta is unremarkable. The  origins of the great vessels of the aortic arch appear patent as visualized. The central pulmonary arteries are unremarkable. Mediastinum/Nodes: No hilar or mediastinal adenopathy. The esophagus is grossly unremarkable. Heterogeneous thyroid gland with small subcentimeter hypodense nodules as well as foci of calcification. Not clinically significant; no follow-up imaging recommended (ref: J Am Coll Radiol. 2015 Feb;12(2): 143-50).No mediastinal fluid collection. Lungs/Pleura: Moderate size high attenuating left pleural effusion consistent with hemothorax. There is partial consolidative changes of the left lower lobe. Minimal right lung base atelectasis. Tiny focus of pneumothorax may be present in the anterior left apex (22/5 and sagittal 99/7). No large pneumothorax. The central airways are patent. Musculoskeletal: Displaced fractures of the posterior left eighth, 9, 10, and eleventh ribs. The tenth and eleventh ribs are fractured in 2 places and severely displaced. Clinical correlation is recommended to evaluate for possibility of flail chest. There is inward angulation and displacement of a  fracture fragment of the tenth rib abutting the lateral aspect of the spleen. There is a large hematoma in the soft tissues of the left lateral chest wall and left flank measuring up to 8 x 20 cm in greatest axial dimensions and 13 cm in craniocaudal length. Several faint small foci of contrast blush (95/6) noted suggestive of active bleed. CT ABDOMEN PELVIS FINDINGS No intra-abdominal free air or free fluid. Hepatobiliary: Indeterminate 16 mm hypodense lesion in the left lobe of the liver, present on the pelvic CT. No intrahepatic biliary ductal dilatation. The gallbladder is unremarkable. Pancreas: Unremarkable. No pancreatic ductal dilatation or surrounding inflammatory changes. Spleen: Normal in size without focal abnormality. Adrenals/Urinary Tract: The adrenal glands unremarkable. There is no hydronephrosis on either  side. Subcentimeter right renal hypodense lesion is not characterized. There is symmetric enhancement and excretion of contrast by both kidneys. The visualized ureters and urinary bladder appear unremarkable. Stomach/Bowel: There is no bowel obstruction or active inflammation. Appendectomy. Vascular/Lymphatic: The abdominal aorta and IVC are unremarkable. No portal venous gas. There is no adenopathy. Reproductive: Hysterectomy.  No adnexal masses Other: Small fat containing umbilical hernia. Musculoskeletal: Degenerative changes of the spine. No acute osseous pathology. IMPRESSION: 1. Displaced fractures of the posterior left eighth, 9, 10, and eleventh ribs. The tenth and eleventh ribs are fractured in 2 places and severely displaced. 2. Moderate left hemothorax with partial consolidative changes of the left lower lobe. 3. Possible tiny left apical pneumothorax. 4. Large hematoma in the soft tissues of the left lateral chest wall and left flank with evidence of active bleed. 5. No acute intra-abdominal or pelvic pathology. These results were called by telephone at the time of interpretation on 09/03/2021 at 7:42 pm to provider JOSHUA LONG , who verbally acknowledged these results. Electronically Signed   By: Anner Crete M.D.   On: 09/03/2021 19:46   DG CHEST PORT 1 VIEW  Result Date: 09/05/2021 CLINICAL DATA:  Left hemothorax. EXAM: PORTABLE CHEST 1 VIEW COMPARISON:  09/04/2021 FINDINGS: 0607 hours. Persistent retrocardiac left base collapse/consolidation with small effusion. Right lung remains clear. The cardio pericardial silhouette is enlarged. Left rib fracture seen by recent CT not well demonstrated on x-ray. Telemetry leads overlie the chest. IMPRESSION: No substantial change. Persistent retrocardiac left base collapse/consolidation with small effusion. Electronically Signed   By: Misty Stanley M.D.   On: 09/05/2021 06:40   DG CHEST PORT 1 VIEW  Result Date: 09/04/2021 CLINICAL DATA:  Fall,  left rib pain, hemothorax EXAM: PORTABLE CHEST 1 VIEW COMPARISON:  09/03/2021 FINDINGS: No significant change in AP portable chest radiograph. Cardiomegaly with a small left pleural effusion. No focal airspace opacity. Known left rib fractures are not clearly appreciated radiographically. IMPRESSION: 1. No significant change in AP portable chest radiograph. Cardiomegaly with a small left pleural effusion. 2. Known left rib fractures are not clearly appreciated radiographically. Electronically Signed   By: Delanna Ahmadi M.D.   On: 09/04/2021 08:01   DG Chest Portable 1 View  Result Date: 09/03/2021 CLINICAL DATA:  Fall. EXAM: PORTABLE CHEST 1 VIEW COMPARISON:  Chest x-ray 06/20/2018. FINDINGS: Heart is mildly enlarged, unchanged. There are minimal interstitial opacities in the left lung base. The lungs are otherwise clear. No pleural effusion or pneumothorax. No acute fractures are seen. IMPRESSION: 1. Cardiomegaly. 2. Minimal interstitial opacities in the left lung base may represent infection or atelectasis. Electronically Signed   By: Ronney Asters M.D.   On: 09/03/2021 17:15   DG Shoulder Left  Result Date:  09/03/2021 CLINICAL DATA:  Fall, pain EXAM: LEFT SHOULDER - 2+ VIEW COMPARISON:  None. FINDINGS: Degenerative changes in the left [facility] and glenohumeral joints with joint space narrowing and spurring, most pronounced in the [facility] joint. No acute bony abnormality. Specifically, no fracture, subluxation, or dislocation. IMPRESSION: Degenerative changes.  No acute bony abnormality. Electronically Signed   By: Rolm Baptise M.D.   On: 09/03/2021 22:06   DG HIP PORT UNILAT WITH PELVIS 1V LEFT  Result Date: 09/03/2021 CLINICAL DATA:  Fall, pain EXAM: DG HIP (WITH OR WITHOUT PELVIS) 1V PORT LEFT COMPARISON:  None. FINDINGS: Limited single view study. No visible fracture, subluxation or dislocation. Mild spurring in the left hip joint. IMPRESSION: Limited study.  No visible fracture Electronically Signed   By:  Rolm Baptise M.D.   On: 09/03/2021 22:05   (Echo, Carotid, EGD, Colonoscopy, ERCP)    Subjective: No complaints  Discharge Exam: Vitals:   09/08/21 0325 09/08/21 0700  BP: (!) 124/49 (!) 109/49  Pulse: 70   Resp: 18   Temp: 97.9 F (36.6 C) 98.1 F (36.7 C)  SpO2: 97%    Vitals:   09/07/21 1934 09/08/21 0033 09/08/21 0325 09/08/21 0700  BP: (!) 97/52 (!) 113/49 (!) 124/49 (!) 109/49  Pulse:  71 70   Resp: 19 20 18    Temp: 98.4 F (36.9 C) 98.1 F (36.7 C) 97.9 F (36.6 C) 98.1 F (36.7 C)  TempSrc: Oral Oral Oral Oral  SpO2:  98% 97%   Weight:      Height:        General: Pt is alert, awake, not in acute distress Cardiovascular: RRR, S1/S2 +, no rubs, no gallops Respiratory: CTA bilaterally, no wheezing, no rhonchi Abdominal: Soft, NT, ND, bowel sounds + Extremities: no edema, no cyanosis    The results of significant diagnostics from this hospitalization (including imaging, microbiology, ancillary and laboratory) are listed below for reference.     Microbiology: Recent Results (from the past 240 hour(s))  Resp Panel by RT-PCR (Flu A&B, Covid) Nasopharyngeal Swab     Status: None   Collection Time: 09/03/21  7:47 PM   Specimen: Nasopharyngeal Swab; Nasopharyngeal(NP) swabs in vial transport medium  Result Value Ref Range Status   SARS Coronavirus 2 by RT PCR NEGATIVE NEGATIVE Final    Comment: (NOTE) SARS-CoV-2 target nucleic acids are NOT DETECTED.  The SARS-CoV-2 RNA is generally detectable in upper respiratory specimens during the acute phase of infection. The lowest concentration of SARS-CoV-2 viral copies this assay can detect is 138 copies/mL. A negative result does not preclude SARS-Cov-2 infection and should not be used as the sole basis for treatment or other patient management decisions. A negative result may occur with  improper specimen collection/handling, submission of specimen other than nasopharyngeal swab, presence of viral mutation(s)  within the areas targeted by this assay, and inadequate number of viral copies(<138 copies/mL). A negative result must be combined with clinical observations, patient history, and epidemiological information. The expected result is Negative.  Fact Sheet for Patients:  EntrepreneurPulse.com.au  Fact Sheet for Healthcare Providers:  IncredibleEmployment.be  This test is no t yet approved or cleared by the [place] FDA and  has been authorized for detection and/or diagnosis of SARS-CoV-2 by FDA under an Emergency Use Authorization (EUA). This EUA will remain  in effect (meaning this test can be used) for the duration of the COVID-19 declaration under Section 564(b)(1) of the Act, 21 U.S.C.section 360bbb-3(b)(1), unless the authorization is terminated  or revoked sooner.       Influenza A by PCR NEGATIVE NEGATIVE Final   Influenza B by PCR NEGATIVE NEGATIVE Final    Comment: (NOTE) The Xpert Xpress SARS-CoV-2/FLU/RSV plus assay is intended as an aid in the diagnosis of influenza from Nasopharyngeal swab specimens and should not be used as a sole basis for treatment. Nasal washings and aspirates are unacceptable for Xpert Xpress SARS-CoV-2/FLU/RSV testing.  Fact Sheet for Patients: EntrepreneurPulse.com.au  Fact Sheet for Healthcare Providers: IncredibleEmployment.be  This test is not yet approved or cleared by the [place] FDA and has been authorized for detection and/or diagnosis of SARS-CoV-2 by FDA under an Emergency Use Authorization (EUA). This EUA will remain in effect (meaning this test can be used) for the duration of the COVID-19 declaration under Section 564(b)(1) of the Act, 21 U.S.C. section 360bbb-3(b)(1), unless the authorization is terminated or revoked.  Performed at [facility], [place] [address] [place], [place] [address]   MRSA Next Gen by PCR, Nasal     Status: None    Collection Time: 09/03/21 10:04 PM   Specimen: Nasal Mucosa; Nasal Swab  Result Value Ref Range Status   MRSA by PCR Next Gen NOT DETECTED NOT DETECTED Final    Comment: (NOTE) The GeneXpert MRSA Assay (FDA approved for NASAL specimens only), is one component of a comprehensive MRSA colonization surveillance program. It is not intended to diagnose MRSA infection nor to guide or monitor treatment for MRSA infections. Test performance is not FDA approved in patients less than 29 years old. Performed at [facility], [place] [address], [place], [place] [address]      Labs: BNP (last 3 results) No results for input(s): BNP in the last 8760 hours. Basic Metabolic Panel: Recent Labs  Lab 09/03/21 1632  NA 138  K 4.1  CL 104  CO2 22  GLUCOSE 219*  BUN 15  CREATININE 0.87  CALCIUM 8.3*   Liver Function Tests: Recent Labs  Lab 09/03/21 1632  AST 19  ALT 14  ALKPHOS 63  BILITOT 0.9  PROT 5.7*  ALBUMIN 2.8*   No results for input(s): LIPASE, AMYLASE in the last 168 hours. No results for input(s): AMMONIA in the last 168 hours. CBC: Recent Labs  Lab 09/03/21 1632 09/03/21 2103 09/04/21 0245 09/05/21 1030 09/06/21 0847 09/06/21 1711 09/07/21 1809  WBC 19.1*  --  14.5* 17.8* 17.8* 16.6* 14.0*  NEUTROABS 16.6*  --   --  13.6* 13.7*  --   --   HGB 10.4*   < > 9.0* 7.7* 7.3* 7.3* 8.0*  HCT 32.1*   < > 28.5* 23.4* 23.2* 23.2* 25.1*  MCV 90.4  --  90.8 89.7 91.3 91.7 90.0  PLT 334  --  333 305 341 306 301   < > = values in this interval not displayed.   Cardiac Enzymes: No results for input(s): CKTOTAL, CKMB, CKMBINDEX, TROPONINI in the last 168 hours. BNP: Invalid input(s): POCBNP CBG: Recent Labs  Lab 09/07/21 0729 09/07/21 1116 09/07/21 1638 09/07/21 2129 09/08/21 0704  GLUCAP 152* 174* 158* 146* 135*   D-Dimer No results for input(s): DDIMER in the last 72 hours. Hgb A1c No results for input(s): HGBA1C in the last 72 hours. Lipid Profile No results  for input(s): CHOL, HDL, LDLCALC, TRIG, CHOLHDL, LDLDIRECT in the last 72 hours. Thyroid function studies No results for input(s): TSH, T4TOTAL, T3FREE, THYROIDAB in the last 72 hours.  Invalid input(s): FREET3 Anemia work up  No results for input(s): VITAMINB12, FOLATE, FERRITIN, TIBC, IRON, RETICCTPCT in the last 72 hours. Urinalysis    Component Value Date/Time   COLORURINE YELLOW 03/28/2015 1404   APPEARANCEUR CLEAR 03/28/2015 1404   LABSPEC 1.024 03/28/2015 1404   PHURINE 5.0 03/28/2015 1404   GLUCOSEU NEGATIVE 03/28/2015 1404   HGBUR TRACE (A) 03/28/2015 1404   BILIRUBINUR NEGATIVE 03/28/2015 1404   KETONESUR NEGATIVE 03/28/2015 1404   PROTEINUR NEGATIVE 03/28/2015 1404   UROBILINOGEN 0.2 03/28/2015 1404   NITRITE NEGATIVE 03/28/2015 1404   LEUKOCYTESUR NEGATIVE 03/28/2015 1404   Sepsis Labs Invalid input(s): PROCALCITONIN,  WBC,  LACTICIDVEN Microbiology Recent Results (from the past 240 hour(s))  Resp Panel by RT-PCR (Flu A&B, Covid) Nasopharyngeal Swab     Status: None   Collection Time: 09/03/21  7:47 PM   Specimen: Nasopharyngeal Swab; Nasopharyngeal(NP) swabs in vial transport medium  Result Value Ref Range Status   SARS Coronavirus 2 by RT PCR NEGATIVE NEGATIVE Final    Comment: (NOTE) SARS-CoV-2 target nucleic acids are NOT DETECTED.  The SARS-CoV-2 RNA is generally detectable in upper respiratory specimens during the acute phase of infection. The lowest concentration of SARS-CoV-2 viral copies this assay can detect is 138 copies/mL. A negative result does not preclude SARS-Cov-2 infection and should not be used as the sole basis for treatment or other patient management decisions. A negative result may occur with  improper specimen collection/handling, submission of specimen other than nasopharyngeal swab, presence of viral mutation(s) within the areas targeted by this assay, and inadequate number of viral copies(<138 copies/mL). A negative result must be  combined with clinical observations, patient history, and epidemiological information. The expected result is Negative.  Fact Sheet for Patients:  EntrepreneurPulse.com.au  Fact Sheet for Healthcare Providers:  IncredibleEmployment.be  This test is no t yet approved or cleared by the [place] FDA and  has been authorized for detection and/or diagnosis of SARS-CoV-2 by FDA under an Emergency Use Authorization (EUA). This EUA will remain  in effect (meaning this test can be used) for the duration of the COVID-19 declaration under Section 564(b)(1) of the Act, 21 U.S.C.section 360bbb-3(b)(1), unless the authorization is terminated  or revoked sooner.       Influenza A by PCR NEGATIVE NEGATIVE Final   Influenza B by PCR NEGATIVE NEGATIVE Final    Comment: (NOTE) The Xpert Xpress SARS-CoV-2/FLU/RSV plus assay is intended as an aid in the diagnosis of influenza from Nasopharyngeal swab specimens and should not be used as a sole basis for treatment. Nasal washings and aspirates are unacceptable for Xpert Xpress SARS-CoV-2/FLU/RSV testing.  Fact Sheet for Patients: EntrepreneurPulse.com.au  Fact Sheet for Healthcare Providers: IncredibleEmployment.be  This test is not yet approved or cleared by the [place] FDA and has been authorized for detection and/or diagnosis of SARS-CoV-2 by FDA under an Emergency Use Authorization (EUA). This EUA will remain in effect (meaning this test can be used) for the duration of the COVID-19 declaration under Section 564(b)(1) of the Act, 21 U.S.C. section 360bbb-3(b)(1), unless the authorization is terminated or revoked.  Performed at [facility], [place] [address], [place], [place] [address]   MRSA Next Gen by PCR, Nasal     Status: None   Collection Time: 09/03/21 10:04 PM   Specimen: Nasal Mucosa; Nasal Swab  Result Value Ref Range Status   MRSA by PCR Next  Gen NOT DETECTED NOT DETECTED Final    Comment: (NOTE) The GeneXpert MRSA Assay (FDA approved for NASAL specimens  only), is one component of a comprehensive MRSA colonization surveillance program. It is not intended to diagnose MRSA infection nor to guide or monitor treatment for MRSA infections. Test performance is not FDA approved in patients less than 76 years old. Performed at [facility], [place] [address] [place], [place] [address]      SIGNED:   Charlynne Cousins, MD  [facility] 09/08/2021, 9:49 AM Pager   If 7PM-7AM, please contact night-coverage [email or web address] Password [email or web address]

## 2021-09-08 NOTE — Care Management Important Message (Signed)
Important Message  Patient Details  Name: Linda Washington MRN: [id number] Date of Birth: March 26, 1951   Medicare Important Message Given:  Yes     Joetta Manners 09/08/2021, 12:48 PM

## 2021-09-08 NOTE — TOC Transition Note (Signed)
Transition of Care [facility]) - CM/SW Discharge Note   Patient Details  Name: Linda Washington MRN: [id number] Date of Birth: 1951/06/10  Transition of Care [facility]) CM/SW Contact:  Bartholomew Crews, RN Phone Number: [phone] 09/08/2021, 11:17 AM   Clinical Narrative:     Spoke with patient at the bedside to discuss plans to transition home. Patient stated that her son will be staying with her to assist as needed. Demographics, PCP, and preferred pharmacy verified.   Discussed DME needs for home oxygen and RW. Referral to [employer] for delivery to the room.   Discussed HH PT/OT recommendations. Patient agreeable. Advised that start of care will likely be delayed until after the holiday - patient agreeable. Offered choice of agency. Referral accepted by [facility] for delayed start of care after the holiday.   Patient stated that she has transportation home.   No further TOC needs identified.   Final next level of care: [place] Barriers to Discharge: No Barriers Identified   Patient Goals and CMS Choice Patient states their goals for this hospitalization and ongoing recovery are:: return with family support CMS Medicare.gov Compare Post Acute Care list provided to:: Patient Choice offered to / list presented to : Patient  Discharge Placement                       Discharge Plan and Services                DME Arranged: Oxygen, Walker rolling DME Agency: [employer] Date DME Agency Contacted: 09/08/21 Time DME Agency Contacted: 38 Representative spoke with at DME Agency: Brenton Grills HH Arranged: OT, PT [place] Agency: [facility] Date [place]: 09/08/21 Time [place]: 1117 Representative spoke with at [place]: [place] ([place]) Interventions     Readmission Risk Interventions No flowsheet data found.

## 2021-09-15 ENCOUNTER — Telehealth: Payer: Self-pay | Admitting: *Deleted

## 2021-09-15 NOTE — Telephone Encounter (Signed)
Pt called and stated that she was in the hospital and was needing to reschedule her appointment to have INR checked. Pt stated that she was taken off her warfarin and is to resume taking it on 12/6. Pt was in hospital from 11/16 to 11/21.  Scheduled pt to come in on 12/12 to have INR checked- 6 days after she resumes her warfarin.   Per D/C summary::  Left hemothorax and chest wall hematoma due to to Coumadin with a left ninth, 10th and 11th rib fracture/acute blood loss anemia: Trauma was consulted her INR was reversed with vitamin K and Concentra. Rib fractures will take about 6 to 8 weeks to heal. She should be off anticoagulation for 2 weeks.   Chronic atrial fibrillation: Rate controlled on Coreg, her INR was reversed due to recent trauma. She will resume Coumadin in 2 weeks.   warfarin 5 MG tablet Commonly known as: COUMADIN Take as directed. If you are unsure how to take this medication, talk to your nurse or doctor. Original instructions: Take 1.5-2 tablets (7.5-10 mg total) by mouth as directed. Take 1.5 tablets (7.5 mg) on MWF & Take 2 tablets (10 mg) all other days AS DIRECTED BY COUMADIN CLINIC Start taking on: September 23, 2021 What changed:  how much to take how to take this when to take this additional instructions These instructions start on September 23, 2021. If you are unsure what to do until then, ask your doctor or other care provider.

## 2021-09-15 NOTE — Telephone Encounter (Signed)
Called pt back to get more details. Pt is to have hospital follow up visit with PCP on 12/6.   Pt stated that her back is bruised and is has "hard  as a rock". The bruise goes from her left breast to her hip to half way across her back. Pt sated that the bruise was black and is now turning into a purple/yellow color.

## 2021-09-23 DIAGNOSIS — M7989 Other specified soft tissue disorders: Secondary | ICD-10-CM | POA: Diagnosis not present

## 2021-09-23 DIAGNOSIS — S2242XD Multiple fractures of ribs, left side, subsequent encounter for fracture with routine healing: Secondary | ICD-10-CM | POA: Diagnosis not present

## 2021-09-23 DIAGNOSIS — D6489 Other specified anemias: Secondary | ICD-10-CM | POA: Diagnosis not present

## 2021-09-29 ENCOUNTER — Other Ambulatory Visit: Payer: Self-pay | Admitting: Family Medicine

## 2021-09-29 ENCOUNTER — Other Ambulatory Visit: Payer: Self-pay

## 2021-09-29 ENCOUNTER — Ambulatory Visit: Payer: Medicare Other | Admitting: *Deleted

## 2021-09-29 DIAGNOSIS — I4892 Unspecified atrial flutter: Secondary | ICD-10-CM | POA: Diagnosis not present

## 2021-09-29 DIAGNOSIS — S2242XA Multiple fractures of ribs, left side, initial encounter for closed fracture: Secondary | ICD-10-CM

## 2021-09-29 DIAGNOSIS — Z5181 Encounter for therapeutic drug level monitoring: Secondary | ICD-10-CM

## 2021-09-29 LAB — POCT INR: INR: 1.6 — AB (ref 2.0–3.0)

## 2021-09-29 NOTE — Patient Instructions (Signed)
Description   Take 2 tablets of warfarin today and then Continue taking Warfarin 2 tablets daily except for 1.5 tablets on Monday, Wednesday and Fridays. If you get an antibiotic call us at [phone]. Recheck INR in 1 week.  Coumadin Clinic [phone].

## 2021-10-08 ENCOUNTER — Other Ambulatory Visit: Payer: Self-pay | Admitting: Family Medicine

## 2021-10-08 ENCOUNTER — Ambulatory Visit
Admission: RE | Admit: 2021-10-08 | Discharge: 2021-10-08 | Disposition: A | Discharge status: Home / Self Care | Payer: Medicare Other | Source: Ambulatory Visit | Attending: Family Medicine | Admitting: Family Medicine

## 2021-10-08 ENCOUNTER — Other Ambulatory Visit: Payer: Self-pay

## 2021-10-08 DIAGNOSIS — T148XXA Other injury of unspecified body region, initial encounter: Secondary | ICD-10-CM

## 2021-10-08 DIAGNOSIS — S2242XA Multiple fractures of ribs, left side, initial encounter for closed fracture: Secondary | ICD-10-CM | POA: Diagnosis not present

## 2021-10-08 DIAGNOSIS — J9811 Atelectasis: Secondary | ICD-10-CM | POA: Diagnosis not present

## 2021-10-08 DIAGNOSIS — J90 Pleural effusion, not elsewhere classified: Secondary | ICD-10-CM | POA: Diagnosis not present

## 2021-10-15 ENCOUNTER — Encounter: Payer: Self-pay | Admitting: Podiatry

## 2021-10-15 ENCOUNTER — Ambulatory Visit: Payer: Medicare Other | Admitting: Podiatry

## 2021-10-15 ENCOUNTER — Other Ambulatory Visit: Payer: Self-pay

## 2021-10-15 DIAGNOSIS — M2010 Hallux valgus (acquired), unspecified foot: Secondary | ICD-10-CM

## 2021-10-15 DIAGNOSIS — M2040 Other hammer toe(s) (acquired), unspecified foot: Secondary | ICD-10-CM

## 2021-10-15 DIAGNOSIS — M79675 Pain in left toe(s): Secondary | ICD-10-CM | POA: Diagnosis not present

## 2021-10-15 DIAGNOSIS — B351 Tinea unguium: Secondary | ICD-10-CM | POA: Diagnosis not present

## 2021-10-15 DIAGNOSIS — E119 Type 2 diabetes mellitus without complications: Secondary | ICD-10-CM

## 2021-10-15 NOTE — Progress Notes (Signed)
This patient returns to my office for at risk foot care.  This patient requires this care by a professional since this patient will be at risk due to having diabetes mellitus.and coagulation defect.   This patient is unable to cut nails herself since the patient cannot reach her nails.These nails are painful walking and wearing shoes.  This patient presents for at risk foot care today.  General Appearance  Alert, conversant and in no acute stress.  Vascular  Dorsalis pedis and posterior tibial  pulses are weakly  palpable  bilaterally.  Capillary return is within normal limits  bilaterally. Temperature is within normal limits  bilaterally.  Neurologic  Senn-Weinstein monofilament wire test diminished   bilaterally. Muscle power within normal limits bilaterally.  Nails Thick disfigured discolored nails with subungual debris  Hallux nails. No evidence of bacterial infection or drainage bilaterally.  Orthopedic  No limitations of motion  feet .  No crepitus or effusions noted.  HAV  B/L.  Hammer toes  B/L.  Haglunds deformity right foot.  Plantar flexed fifth metatarsal  B/L.  Skin  normotropic skin  noted bilaterally.  No signs of infections or ulcers noted.   Porokeratosis sub 5th met  B/L asymptomatic.  Onychomycosis  Pain in right toes  Pain in left toes    Consent was obtained for treatment procedures.   Mechanical debridement of nails 1-5  bilaterally performed with a nail nipper.  Filed with dremel without incident.   Return office visit    3 months                  Told patient to return for periodic foot care and evaluation due to potential at risk complications.   Gardiner Barefoot DPM

## 2021-10-16 ENCOUNTER — Ambulatory Visit: Payer: Medicare Other

## 2021-10-16 DIAGNOSIS — Z5181 Encounter for therapeutic drug level monitoring: Secondary | ICD-10-CM | POA: Diagnosis not present

## 2021-10-16 DIAGNOSIS — I4892 Unspecified atrial flutter: Secondary | ICD-10-CM

## 2021-10-16 LAB — SPECIMEN STATUS REPORT

## 2021-10-16 LAB — POCT INR: INR: 7 — AB (ref 2.0–3.0)

## 2021-10-16 NOTE — Patient Instructions (Signed)
Description   STAT lab results 7.9.  Called and instructed pt to hold Warfarin today, Friday, Saturday. 1 tablet on Sunday and 1 tablet on Monday Then resume taking 2 tablets daily except for 1.5 tablets on Monday, Wednesday and Fridays.  - Recheck INR Wednesday 10/22/21.   Coumadin Clinic [phone].

## 2021-10-22 ENCOUNTER — Ambulatory Visit (INDEPENDENT_AMBULATORY_CARE_PROVIDER_SITE_OTHER): Payer: Medicare HMO

## 2021-10-22 ENCOUNTER — Other Ambulatory Visit: Payer: Self-pay

## 2021-10-22 DIAGNOSIS — I4892 Unspecified atrial flutter: Secondary | ICD-10-CM

## 2021-10-22 DIAGNOSIS — Z5181 Encounter for therapeutic drug level monitoring: Secondary | ICD-10-CM | POA: Diagnosis not present

## 2021-10-22 LAB — POCT INR: INR: 1.5 — AB (ref 2.0–3.0)

## 2021-10-22 NOTE — Patient Instructions (Signed)
Description   Take 2 tablets today and then resume taking 2 tablets daily except for 1.5 tablets on Monday, Wednesday and Fridays.  - Recheck INR 10 days.    Coumadin Clinic [phone].

## 2021-10-24 LAB — PROTIME-INR
INR: 7.9 (ref 0.9–1.2)
Prothrombin Time: 72.7 s — ABNORMAL HIGH (ref 9.1–12.0)

## 2021-10-30 DIAGNOSIS — S2242XD Multiple fractures of ribs, left side, subsequent encounter for fracture with routine healing: Secondary | ICD-10-CM | POA: Diagnosis not present

## 2021-10-30 DIAGNOSIS — R296 Repeated falls: Secondary | ICD-10-CM | POA: Diagnosis not present

## 2021-10-30 DIAGNOSIS — D6489 Other specified anemias: Secondary | ICD-10-CM | POA: Diagnosis not present

## 2021-10-30 DIAGNOSIS — M7989 Other specified soft tissue disorders: Secondary | ICD-10-CM | POA: Diagnosis not present

## 2021-11-03 ENCOUNTER — Other Ambulatory Visit: Payer: Self-pay

## 2021-11-03 ENCOUNTER — Ambulatory Visit (INDEPENDENT_AMBULATORY_CARE_PROVIDER_SITE_OTHER): Payer: Medicare HMO

## 2021-11-03 DIAGNOSIS — I4892 Unspecified atrial flutter: Secondary | ICD-10-CM

## 2021-11-03 DIAGNOSIS — Z5181 Encounter for therapeutic drug level monitoring: Secondary | ICD-10-CM

## 2021-11-03 LAB — POCT INR: INR: 3.4 — AB (ref 2.0–3.0)

## 2021-11-03 NOTE — Patient Instructions (Signed)
-   Take 1/2 tablets today and then  - resume taking 2 tablets daily except for 1.5 tablets on Monday, Wednesday and Fridays.  - Recheck INR in 2 weeks

## 2021-11-05 DIAGNOSIS — E538 Deficiency of other specified B group vitamins: Secondary | ICD-10-CM | POA: Diagnosis not present

## 2021-11-06 DIAGNOSIS — M545 Low back pain, unspecified: Secondary | ICD-10-CM | POA: Diagnosis not present

## 2021-11-06 DIAGNOSIS — M79605 Pain in left leg: Secondary | ICD-10-CM | POA: Diagnosis not present

## 2021-11-06 DIAGNOSIS — R2681 Unsteadiness on feet: Secondary | ICD-10-CM | POA: Diagnosis not present

## 2021-11-06 DIAGNOSIS — M6281 Muscle weakness (generalized): Secondary | ICD-10-CM | POA: Diagnosis not present

## 2021-11-10 DIAGNOSIS — H04203 Unspecified epiphora, bilateral lacrimal glands: Secondary | ICD-10-CM | POA: Diagnosis not present

## 2021-11-10 DIAGNOSIS — R059 Cough, unspecified: Secondary | ICD-10-CM | POA: Diagnosis not present

## 2021-11-10 DIAGNOSIS — U071 COVID-19: Secondary | ICD-10-CM | POA: Diagnosis not present

## 2021-11-10 DIAGNOSIS — Z6841 Body Mass Index (BMI) 40.0 and over, adult: Secondary | ICD-10-CM | POA: Diagnosis not present

## 2021-11-10 DIAGNOSIS — J3489 Other specified disorders of nose and nasal sinuses: Secondary | ICD-10-CM | POA: Diagnosis not present

## 2021-11-19 DIAGNOSIS — E538 Deficiency of other specified B group vitamins: Secondary | ICD-10-CM | POA: Diagnosis not present

## 2021-11-21 ENCOUNTER — Other Ambulatory Visit: Payer: Self-pay | Admitting: Family Medicine

## 2021-11-21 DIAGNOSIS — Z1231 Encounter for screening mammogram for malignant neoplasm of breast: Secondary | ICD-10-CM

## 2021-11-24 ENCOUNTER — Ambulatory Visit (INDEPENDENT_AMBULATORY_CARE_PROVIDER_SITE_OTHER): Payer: Medicare HMO

## 2021-11-24 ENCOUNTER — Other Ambulatory Visit: Payer: Self-pay

## 2021-11-24 DIAGNOSIS — Z5181 Encounter for therapeutic drug level monitoring: Secondary | ICD-10-CM

## 2021-11-24 DIAGNOSIS — I4892 Unspecified atrial flutter: Secondary | ICD-10-CM

## 2021-11-24 LAB — POCT INR: INR: 5.7 — AB (ref 2.0–3.0)

## 2021-11-24 NOTE — Patient Instructions (Signed)
Description   Skip today and tomorrow's dosage of Warfarin, then take 1 tablet on Wednesday and then start taking 1.5 tablets daily except 2 tablets on Sundays.  Recheck INR in 10 days.   Coumadin Clinic [phone].

## 2021-11-26 ENCOUNTER — Other Ambulatory Visit: Payer: Self-pay | Admitting: Cardiology

## 2021-11-26 DIAGNOSIS — E538 Deficiency of other specified B group vitamins: Secondary | ICD-10-CM | POA: Diagnosis not present

## 2021-12-01 DIAGNOSIS — Z7984 Long term (current) use of oral hypoglycemic drugs: Secondary | ICD-10-CM | POA: Diagnosis not present

## 2021-12-01 DIAGNOSIS — E1142 Type 2 diabetes mellitus with diabetic polyneuropathy: Secondary | ICD-10-CM | POA: Diagnosis not present

## 2021-12-01 DIAGNOSIS — E1165 Type 2 diabetes mellitus with hyperglycemia: Secondary | ICD-10-CM | POA: Diagnosis not present

## 2021-12-02 DIAGNOSIS — M1711 Unilateral primary osteoarthritis, right knee: Secondary | ICD-10-CM | POA: Diagnosis not present

## 2021-12-04 ENCOUNTER — Ambulatory Visit (INDEPENDENT_AMBULATORY_CARE_PROVIDER_SITE_OTHER): Payer: Medicare HMO | Admitting: *Deleted

## 2021-12-04 ENCOUNTER — Other Ambulatory Visit: Payer: Self-pay

## 2021-12-04 DIAGNOSIS — Z5181 Encounter for therapeutic drug level monitoring: Secondary | ICD-10-CM

## 2021-12-04 DIAGNOSIS — I4892 Unspecified atrial flutter: Secondary | ICD-10-CM

## 2021-12-04 LAB — POCT INR: INR: 2.3 (ref 2.0–3.0)

## 2021-12-04 NOTE — Patient Instructions (Signed)
Description   Continue taking Warfarin 1.5 tablets daily except 2 tablets on Sundays. Keep leafy veggies consistent. Recheck INR in 2 weeks. Coumadin Clinic [phone].

## 2021-12-09 ENCOUNTER — Ambulatory Visit
Admission: RE | Admit: 2021-12-09 | Discharge: 2021-12-09 | Disposition: A | Discharge status: Home / Self Care | Payer: Medicare HMO | Source: Ambulatory Visit | Attending: Family Medicine | Admitting: Family Medicine

## 2021-12-09 DIAGNOSIS — Z1231 Encounter for screening mammogram for malignant neoplasm of breast: Secondary | ICD-10-CM

## 2021-12-18 ENCOUNTER — Ambulatory Visit (INDEPENDENT_AMBULATORY_CARE_PROVIDER_SITE_OTHER): Payer: Medicare HMO | Admitting: *Deleted

## 2021-12-18 ENCOUNTER — Other Ambulatory Visit: Payer: Self-pay

## 2021-12-18 ENCOUNTER — Other Ambulatory Visit: Payer: Self-pay | Admitting: Cardiology

## 2021-12-18 DIAGNOSIS — I4892 Unspecified atrial flutter: Secondary | ICD-10-CM

## 2021-12-18 DIAGNOSIS — Z5181 Encounter for therapeutic drug level monitoring: Secondary | ICD-10-CM

## 2021-12-18 LAB — POCT INR: INR: 3.2 — AB (ref 2.0–3.0)

## 2021-12-18 NOTE — Patient Instructions (Signed)
Description   ?Today take 1 tablet then continue taking Warfarin 1.5 tablets daily except 2 tablets on Sundays. Keep leafy veggies consistent. Recheck INR in 2 weeks. Coumadin Clinic [phone].  ?  ?  ?

## 2021-12-19 ENCOUNTER — Telehealth: Payer: Self-pay | Admitting: Cardiology

## 2021-12-19 NOTE — Telephone Encounter (Signed)
Patient is having a cortisone shot next week in her back. She needs to speak to some in regards to her warfarin.  ?

## 2021-12-19 NOTE — Telephone Encounter (Signed)
Returned call to the pt and was sent to voicemail; left a message for her to call back before 5pm if she would like to reach [place] & left call back number.  ?

## 2021-12-22 NOTE — Telephone Encounter (Signed)
Received a call back from the pt and she stated that Dr. Nelva Bush was planning a back injection on 3/9 and wants her to hold her warfarin 5 days. Advised that today will be less than 5 days prior to procedure and that we will need a clearance form from her procedure physician to hold the warfarin safely. She states she will call them and update them. I will follow up to ensure that a clearance form is received.  ?

## 2021-12-23 NOTE — Telephone Encounter (Incomplete Revision)
Called [facility] regarding pt having a procedure soon since no clearance form is in the chart. [facility] stated she would get the message to procedure team that this is needed. Also, advised that the pt is still taking her warfarin as of yesterday per her reporting. Gave them the fax number for procedures and will await a call back for an update as well.    Called pt and left her a message to call back regarding her procedure any update if any about her procedure. Also, have not heard back from [facility] at this time.

## 2021-12-23 NOTE — Telephone Encounter (Addendum)
Called [facility] regarding pt having a procedure soon since no clearance form is in the chart. [facility] stated she would get the message to procedure team that this is needed. Also, advised that the pt is still taking her warfarin as of yesterday per her reporting. Gave them the fax number for procedures and will await a call back for an update as well.  ? ? ?Called pt and left her a message to call back regarding her procedure any update if any about her procedure. Also, have not heard back from [facility] at this time.  ? ?12/25/2021 have not heard back from [facility] or the patient regarding if the procedure was scheduled or not and no clearance form received if she needs to hold warfarin. Will remove this note and await for a return call from either [facility] or the patient.  ?

## 2021-12-28 IMAGING — DX DG CHEST 1V PORT
1 series · 1 of 1 positions shown · non-contrast
Comparison: Chest x-ray 06/20/2018.

CLINICAL DATA: Fall.

EXAM:
PORTABLE CHEST 1 VIEW

[chest]
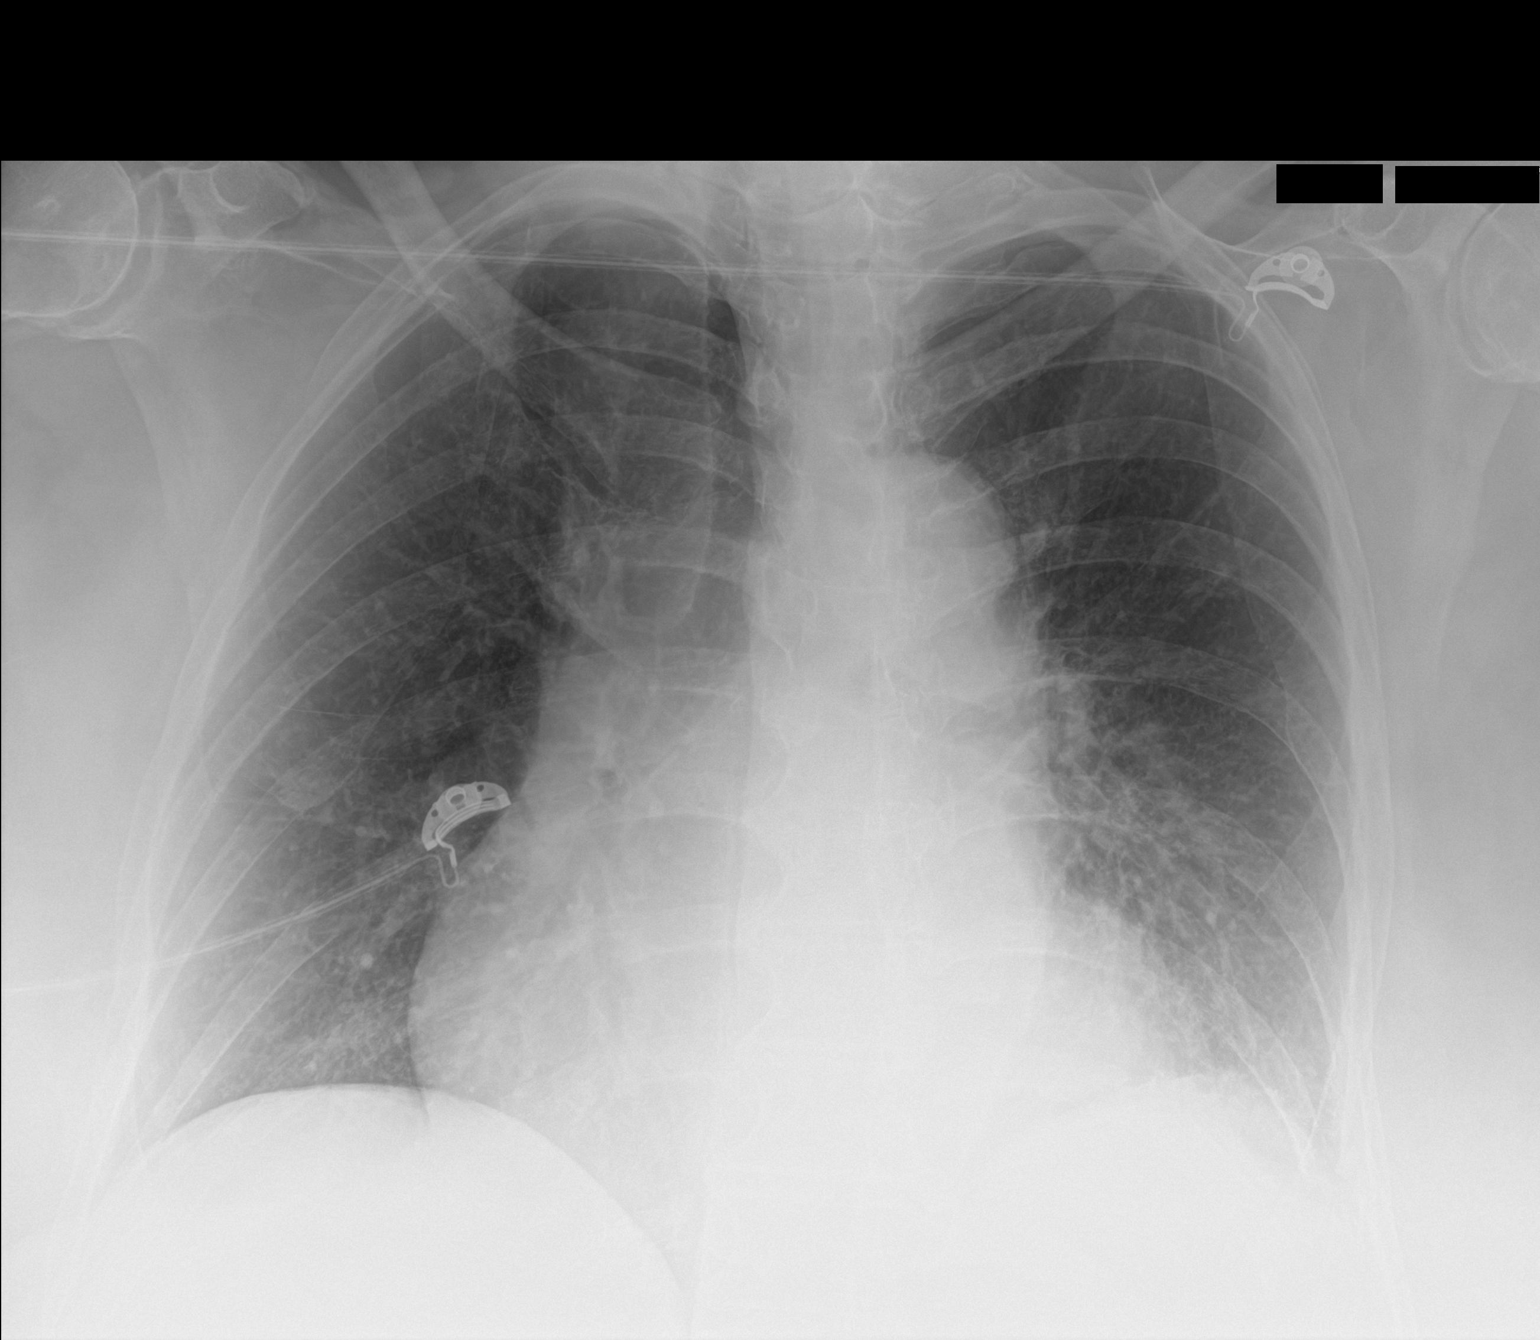

[1 of 1 positions shown; findings below may reference images not displayed]

FINDINGS: Heart is mildly enlarged, unchanged. There are minimal interstitial
opacities in the left lung base. The lungs are otherwise clear. No
pleural effusion or pneumothorax. No acute fractures are seen.
IMPRESSION: 1. Cardiomegaly.
2. Minimal interstitial opacities in the left lung base may
represent infection or atelectasis.

## 2021-12-28 IMAGING — CT CT HEAD W/O CM
4 series · 17 of 47 positions shown, 19 images · non-contrast
Comparison: None.

CLINICAL DATA: Head trauma, minor (Age >= 65y)

Fall with head and neck pain.
EXAM:
CT HEAD WITHOUT CONTRAST
TECHNIQUE: Contiguous axial images were obtained from the base of the skull
through the vertex without intravenous contrast.

[Series 3: head wo · axial · 0.39mm/px · z∈[-148,-23]mm · 7 of 35 slices shown, 9 images]
[im 5/35  brain]
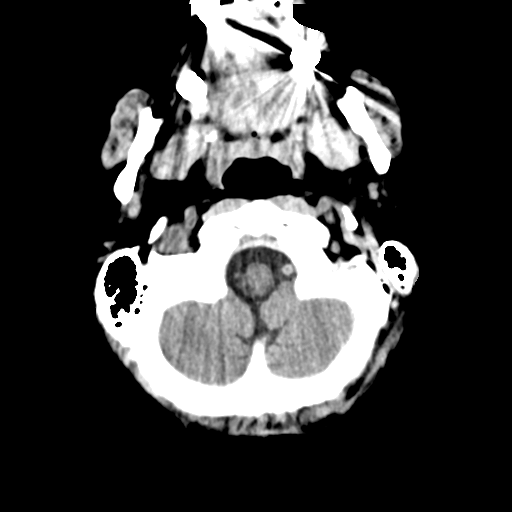
[im 5/35  bone]
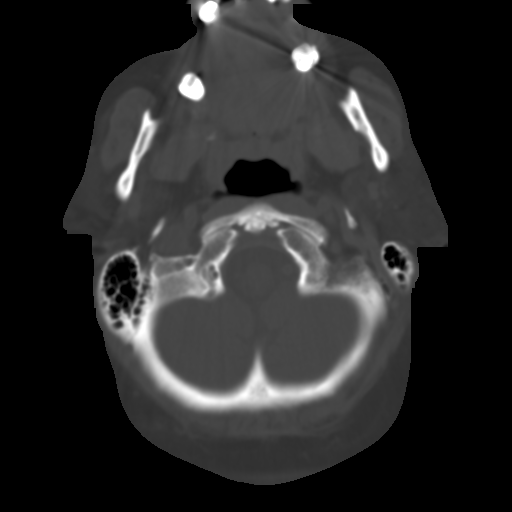
[im 9/35  brain]
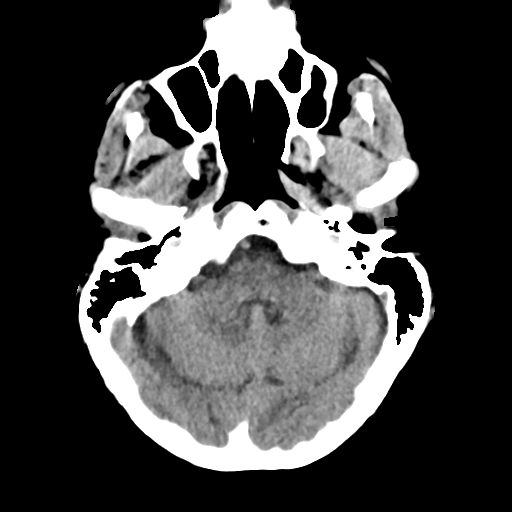
[im 13/35  brain]
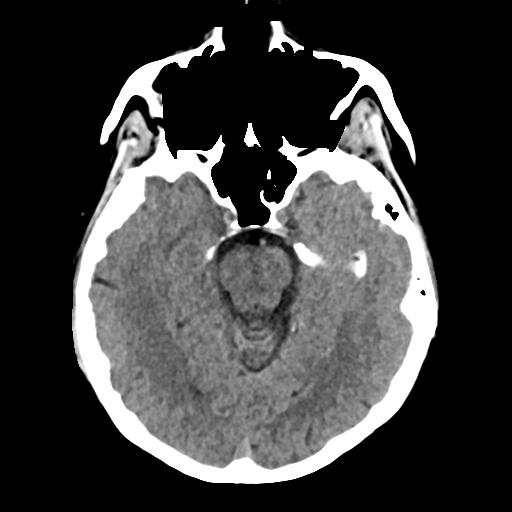
[im 18/35  brain]
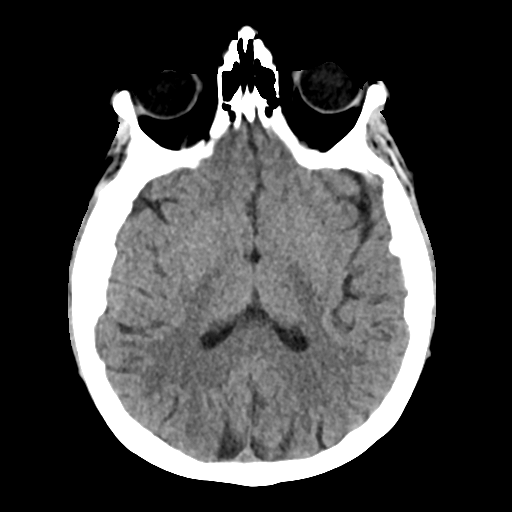
[im 22/35  brain]
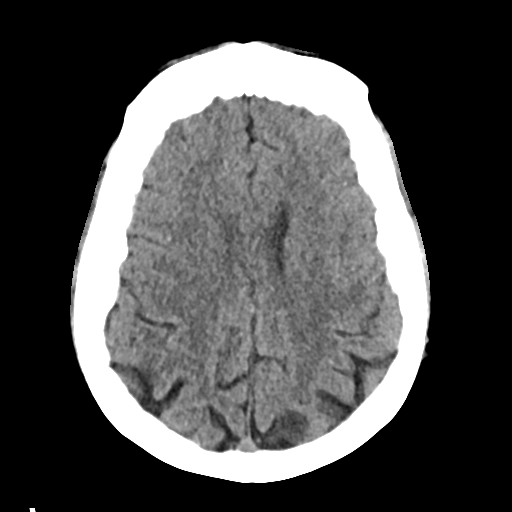
[im 22/35  bone]
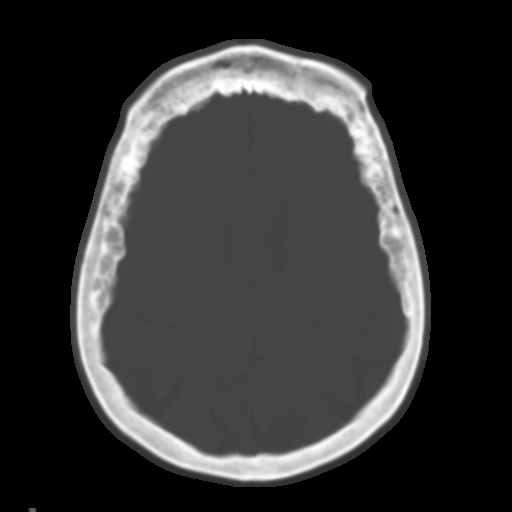
[im 26/35  brain]
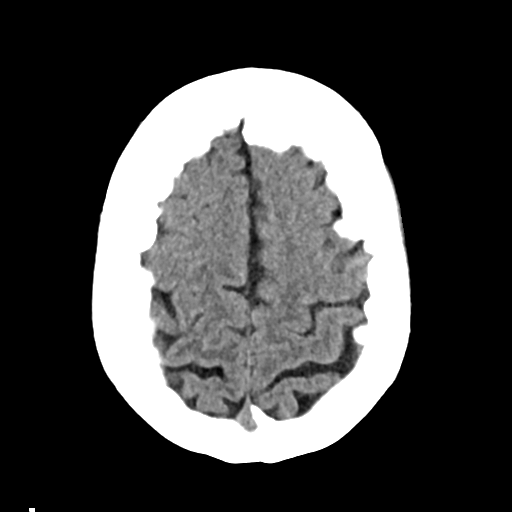
[im 30/35  brain]
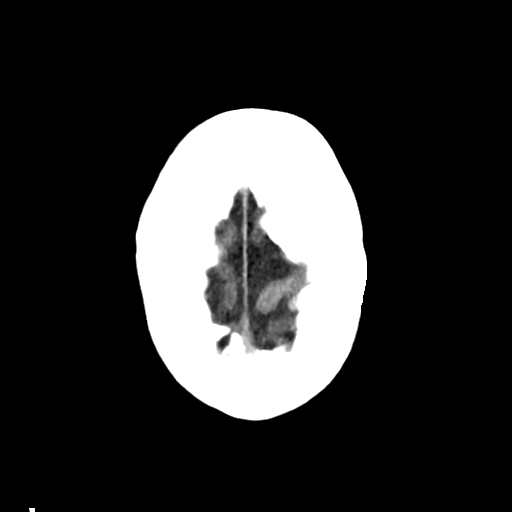

[Series 4: head bone · axial · 0.39mm/px · z∈[-152,-92]mm · 4 of 86 slices shown]
[im 9/86  bone]
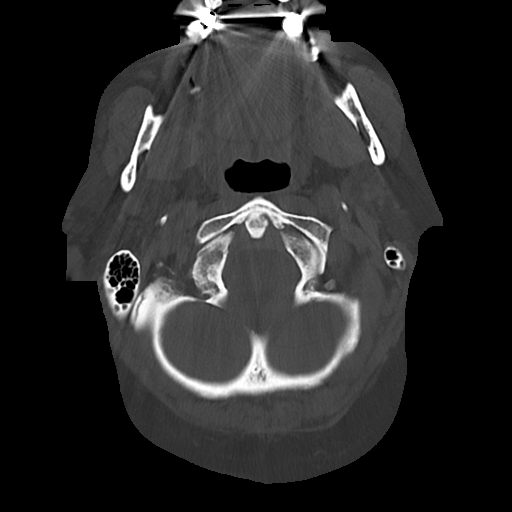
[im 18/86  bone]
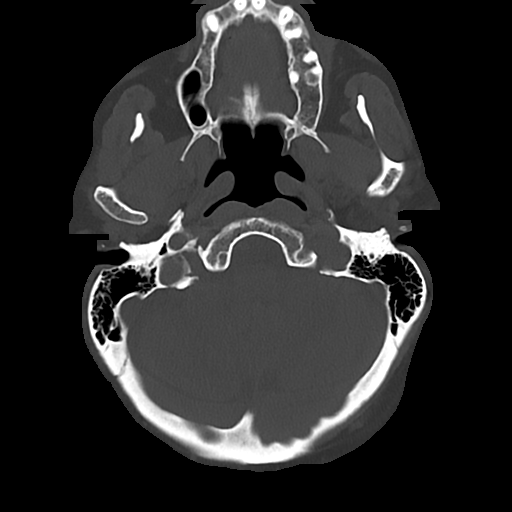
[im 26/86  bone]
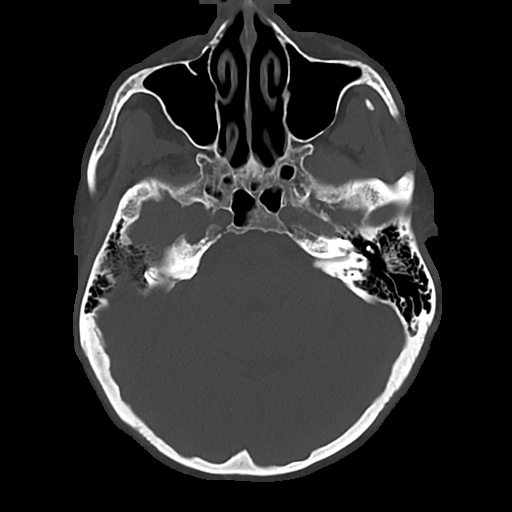
[im 39/86  bone]
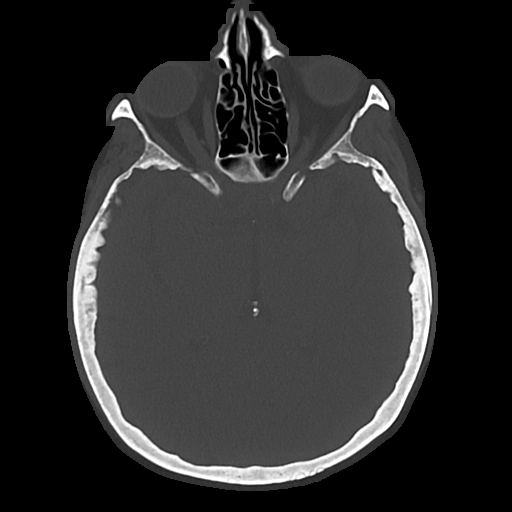

[Series 5: cor soft · coronal · 0.36mm/px · 3 of 79 slices shown]
[im 29/79  brain]
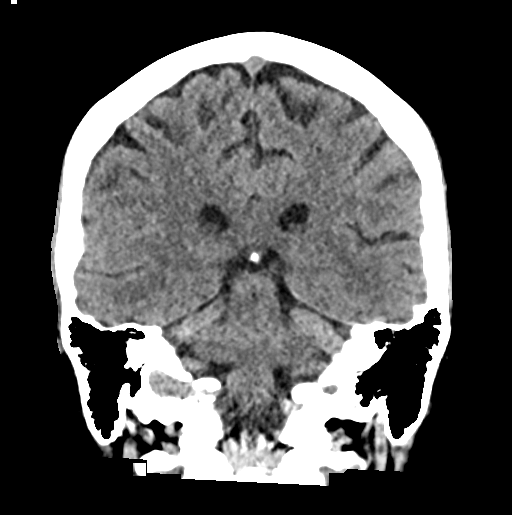
[im 36/79  brain]
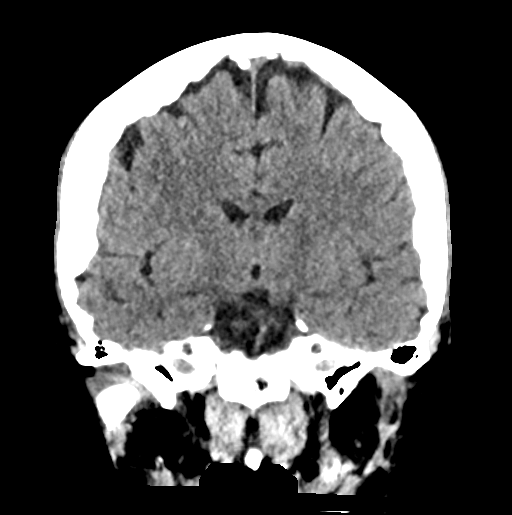
[im 43/79  brain]
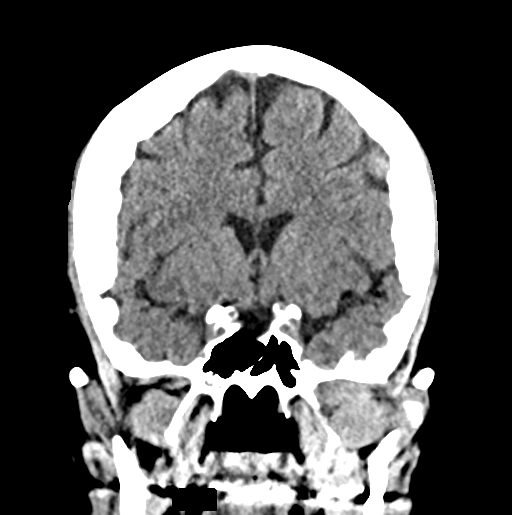

[Series 6: sag soft · sagittal · 0.39mm/px · 3 of 62 slices shown]
[im 21/62  brain]
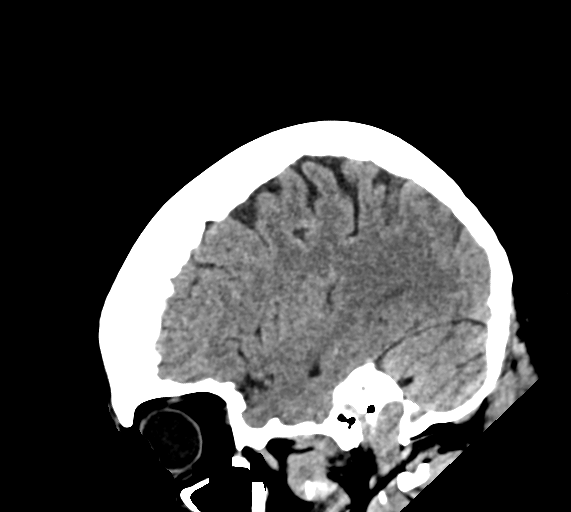
[im 31/62  brain]
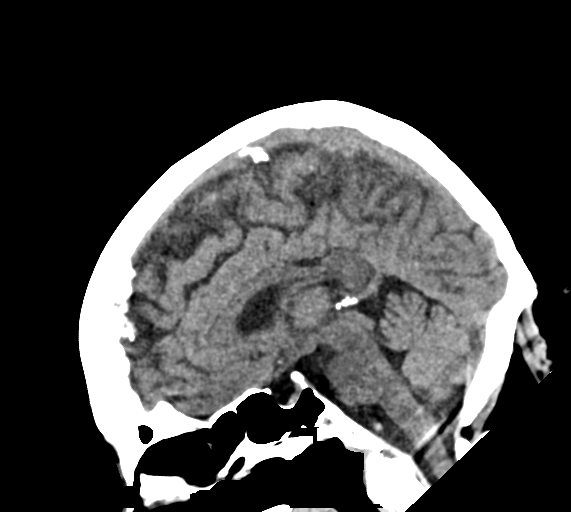
[im 41/62  brain]
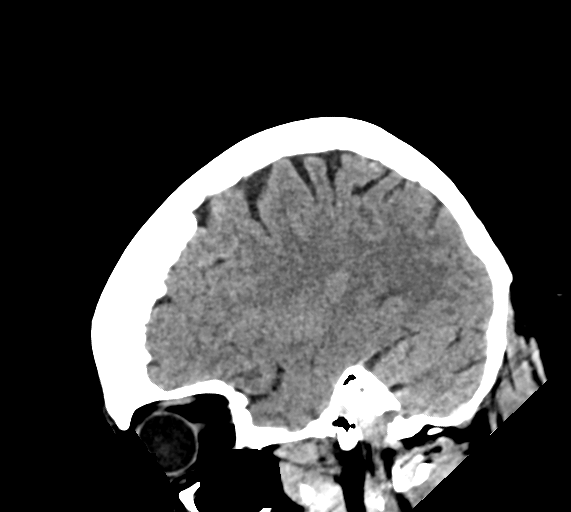

[17 of 47 positions shown; findings below may reference images not displayed]

FINDINGS: Brain: No intracranial hemorrhage, mass effect, or midline shift. No
hydrocephalus. The basilar cisterns are patent. No evidence of
territorial infarct or acute ischemia. No extra-axial or
intracranial fluid collection.

Vascular: No hyperdense vessel or unexpected calcification.

Skull: No fracture or focal lesion.  Frontal hyperostosis.

Sinuses/Orbits: Paranasal sinuses and mastoid air cells are clear.
The visualized orbits are unremarkable. Bilateral cataract
resection.

Other: None.  No confluent scalp hematoma.
IMPRESSION: No acute intracranial abnormality. No skull fracture.

## 2021-12-28 IMAGING — DX DG HIP (WITH OR WITHOUT PELVIS) 1V PORT*L*
2 series · 2 of 2 positions shown · non-contrast
Comparison: None.

CLINICAL DATA: Fall, pain

EXAM:
DG HIP (WITH OR WITHOUT PELVIS) 1V PORT LEFT

[hip (1 of 2)]
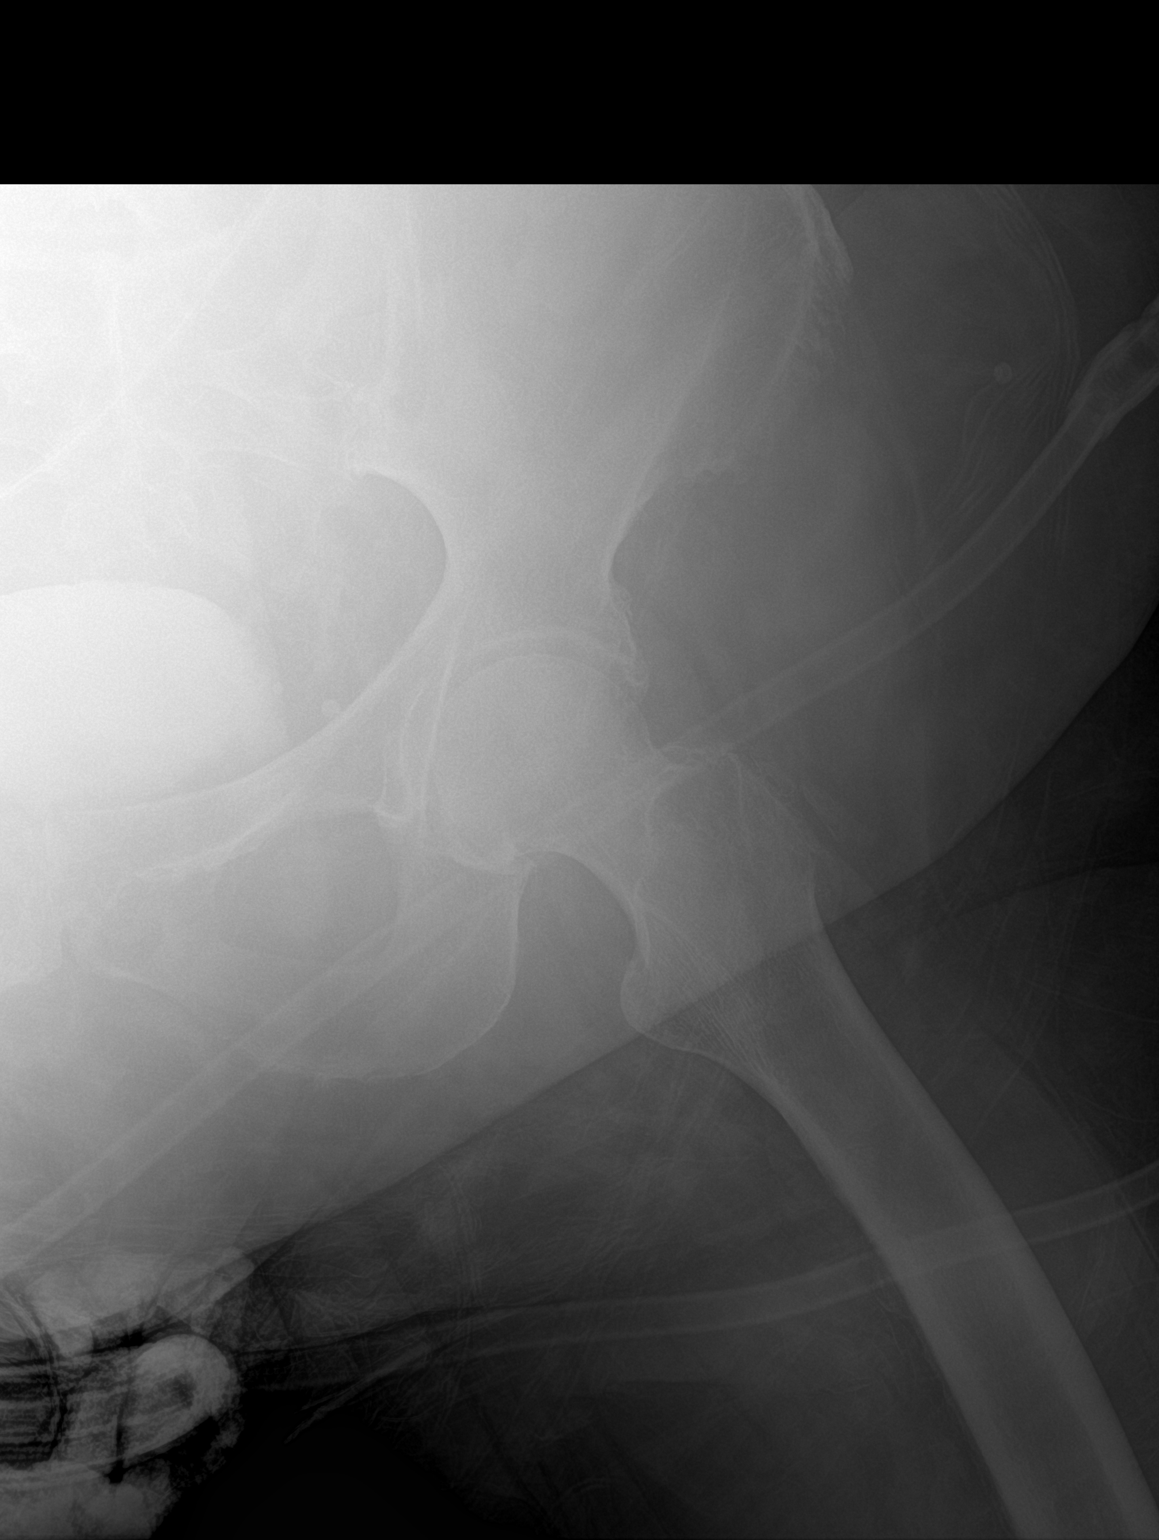

[hip (2 of 2)]
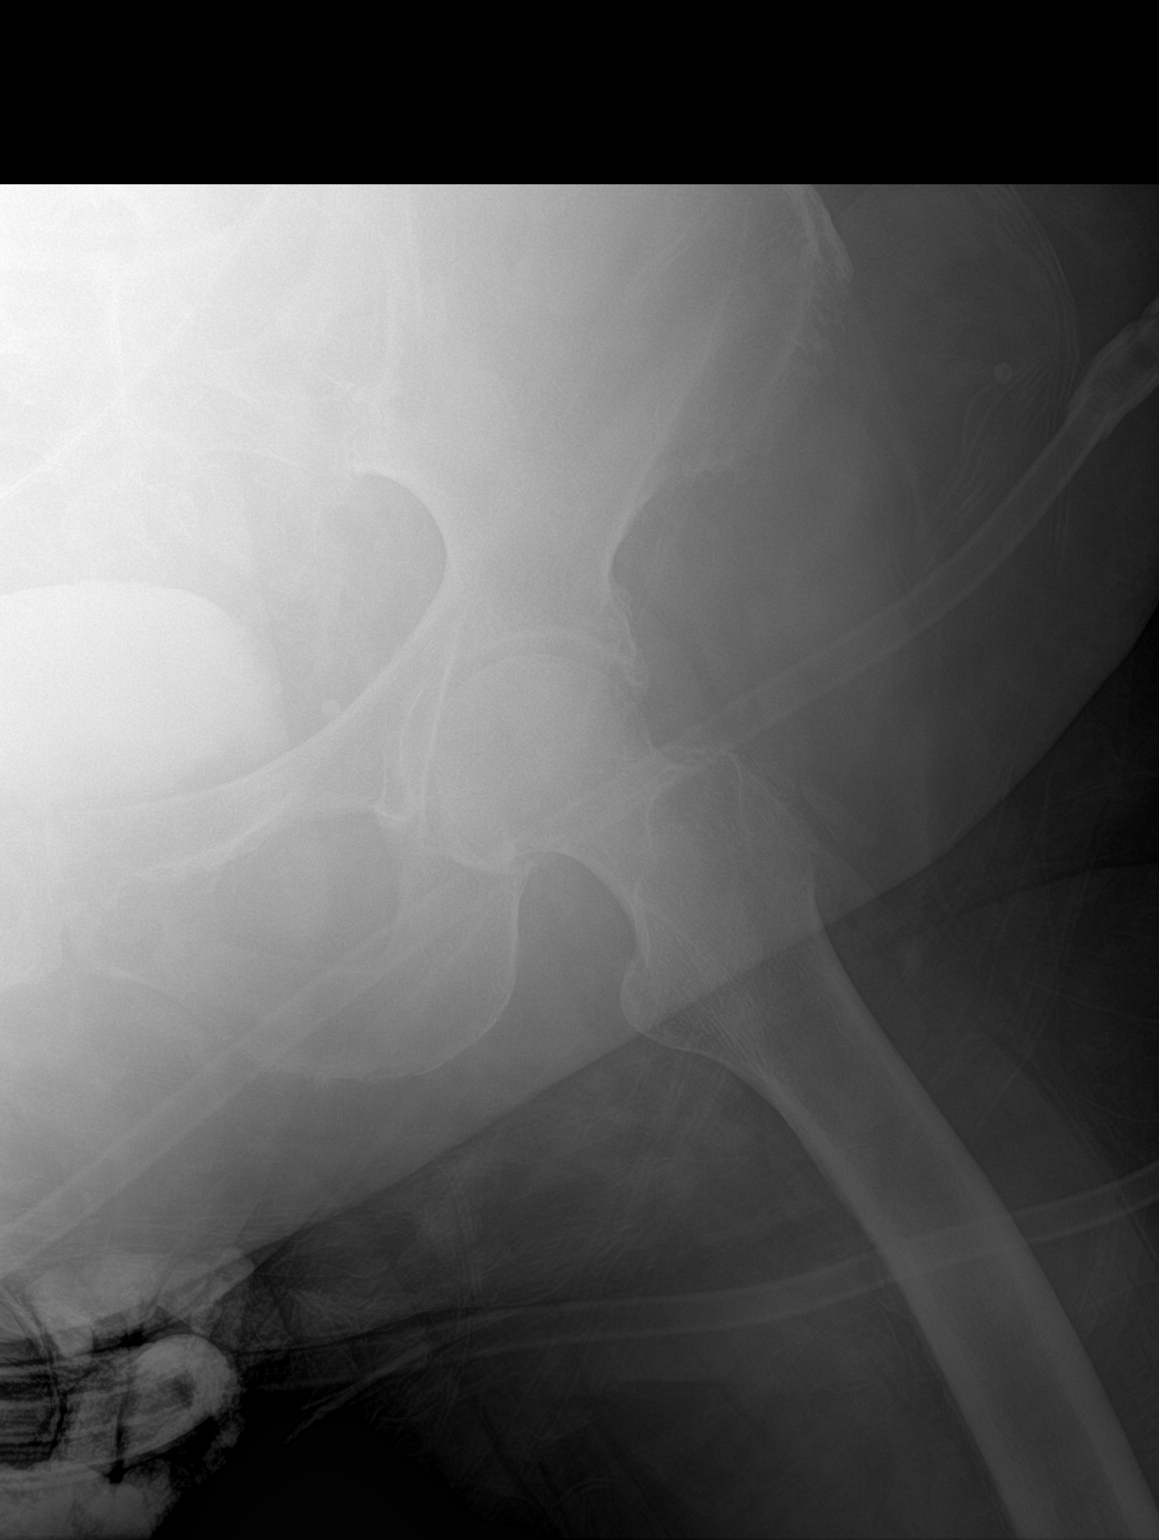

[2 of 2 positions shown; findings below may reference images not displayed]

FINDINGS: Limited single view study. No visible fracture, subluxation or
dislocation. Mild spurring in the left hip joint.
IMPRESSION: Limited study.  No visible fracture

## 2022-01-01 ENCOUNTER — Ambulatory Visit (INDEPENDENT_AMBULATORY_CARE_PROVIDER_SITE_OTHER): Payer: Medicare HMO | Admitting: *Deleted

## 2022-01-01 ENCOUNTER — Other Ambulatory Visit: Payer: Self-pay

## 2022-01-01 DIAGNOSIS — I4892 Unspecified atrial flutter: Secondary | ICD-10-CM

## 2022-01-01 DIAGNOSIS — Z5181 Encounter for therapeutic drug level monitoring: Secondary | ICD-10-CM | POA: Diagnosis not present

## 2022-01-01 LAB — POCT INR: INR: 2.6 (ref 2.0–3.0)

## 2022-01-01 NOTE — Patient Instructions (Signed)
Description   ?Continue taking Warfarin 1.5 tablets daily except 2 tablets on Sundays. Keep leafy veggies consistent. Recheck INR in 3 weeks. Coumadin Clinic [phone].  ?  ?  ?

## 2022-01-12 DIAGNOSIS — L309 Dermatitis, unspecified: Secondary | ICD-10-CM | POA: Diagnosis not present

## 2022-01-12 DIAGNOSIS — B078 Other viral warts: Secondary | ICD-10-CM | POA: Diagnosis not present

## 2022-01-12 DIAGNOSIS — L218 Other seborrheic dermatitis: Secondary | ICD-10-CM | POA: Diagnosis not present

## 2022-01-12 DIAGNOSIS — D1721 Benign lipomatous neoplasm of skin and subcutaneous tissue of right arm: Secondary | ICD-10-CM | POA: Diagnosis not present

## 2022-01-12 DIAGNOSIS — L814 Other melanin hyperpigmentation: Secondary | ICD-10-CM | POA: Diagnosis not present

## 2022-01-12 DIAGNOSIS — L821 Other seborrheic keratosis: Secondary | ICD-10-CM | POA: Diagnosis not present

## 2022-01-12 DIAGNOSIS — L304 Erythema intertrigo: Secondary | ICD-10-CM | POA: Diagnosis not present

## 2022-01-12 DIAGNOSIS — L570 Actinic keratosis: Secondary | ICD-10-CM | POA: Diagnosis not present

## 2022-01-12 DIAGNOSIS — Z85828 Personal history of other malignant neoplasm of skin: Secondary | ICD-10-CM | POA: Diagnosis not present

## 2022-01-14 ENCOUNTER — Ambulatory Visit: Payer: Medicare HMO | Admitting: Podiatry

## 2022-01-14 ENCOUNTER — Encounter: Payer: Self-pay | Admitting: Podiatry

## 2022-01-14 DIAGNOSIS — M79674 Pain in right toe(s): Secondary | ICD-10-CM | POA: Diagnosis not present

## 2022-01-14 DIAGNOSIS — E119 Type 2 diabetes mellitus without complications: Secondary | ICD-10-CM | POA: Diagnosis not present

## 2022-01-14 DIAGNOSIS — M79675 Pain in left toe(s): Secondary | ICD-10-CM

## 2022-01-14 DIAGNOSIS — M2010 Hallux valgus (acquired), unspecified foot: Secondary | ICD-10-CM

## 2022-01-14 DIAGNOSIS — B351 Tinea unguium: Secondary | ICD-10-CM | POA: Diagnosis not present

## 2022-01-14 DIAGNOSIS — M2040 Other hammer toe(s) (acquired), unspecified foot: Secondary | ICD-10-CM

## 2022-01-14 NOTE — Progress Notes (Signed)
This patient returns to my office for at risk foot care.  This patient requires this care by a professional since this patient will be at risk due to having diabetes mellitus.and coagulation defect.   This patient is unable to cut nails herself since the patient cannot reach her nails.These nails are painful walking and wearing shoes.  This patient presents for at risk foot care today. ° °General Appearance  Alert, conversant and in no acute stress. ° °Vascular  Dorsalis pedis and posterior tibial  pulses are weakly  palpable  bilaterally.  Capillary return is within normal limits  bilaterally. Temperature is within normal limits  bilaterally. ° °Neurologic  Senn-Weinstein monofilament wire test diminished   bilaterally. Muscle power within normal limits bilaterally. ° °Nails Thick disfigured discolored nails with subungual debris  Hallux nails. No evidence of bacterial infection or drainage bilaterally. ° °Orthopedic  No limitations of motion  feet .  No crepitus or effusions noted.  HAV  B/L.  Hammer toes  B/L.  Haglunds deformity right foot.  Plantar flexed fifth metatarsal  B/L. ° °Skin  normotropic skin  noted bilaterally.  No signs of infections or ulcers noted.   Porokeratosis sub 5th met  B/L asymptomatic. ° °Onychomycosis  Pain in right toes  Pain in left toes   ° °Consent was obtained for treatment procedures.   Mechanical debridement of nails 1-5  bilaterally performed with a nail nipper.  Filed with dremel without incident. ° ° °Return office visit    3 months                  Told patient to return for periodic foot care and evaluation due to potential at risk complications. ° ° °Giamarie Bueche DPM  °

## 2022-01-18 ENCOUNTER — Other Ambulatory Visit: Payer: Self-pay | Admitting: Cardiology

## 2022-01-22 ENCOUNTER — Telehealth: Payer: Self-pay | Admitting: Cardiology

## 2022-01-22 ENCOUNTER — Ambulatory Visit (INDEPENDENT_AMBULATORY_CARE_PROVIDER_SITE_OTHER): Payer: Medicare HMO

## 2022-01-22 DIAGNOSIS — Z5181 Encounter for therapeutic drug level monitoring: Secondary | ICD-10-CM | POA: Diagnosis not present

## 2022-01-22 DIAGNOSIS — I4892 Unspecified atrial flutter: Secondary | ICD-10-CM | POA: Diagnosis not present

## 2022-01-22 LAB — POCT INR: INR: 2.2 (ref 2.0–3.0)

## 2022-01-22 NOTE — Telephone Encounter (Signed)
? ?  Pre-operative Risk Assessment  ?  ?Patient Name: Linda Washington  ?DOB: 1951/06/28 ?MRN: [id number]  ? ? ? ?Request for Surgical Clearance   ? ?Procedure:   Root Canal ? ?Date of Surgery:  Clearance 01/27/22                              ?   ?Surgeon:  Dr. Nicki Reaper ?Surgeon's Group or Practice Name:  [facility] ?Phone number:  [phone] ?Fax number:  [phone] ?  ?Type of Clearance Requested:   ?- Pharmacy:  Hold Warfarin (Coumadin) up to cardiologist ?  ?Type of Anesthesia:  Local  ?  ?Additional requests/questions:   ? ?Signed, ?Leah Newnam   ?01/22/2022, 2:28 PM   ?

## 2022-01-22 NOTE — Patient Instructions (Signed)
Description   ?Continue taking Warfarin 1.5 tablets daily except 2 tablets on Sundays. Keep leafy veggies consistent. Recheck INR in 3 weeks. Coumadin Clinic [phone].  ?  ?   ?

## 2022-01-23 NOTE — Telephone Encounter (Signed)
? ?  Patient Name: Linda Washington  ?DOB: 1950-11-01 ?MRN: [id number] ? ?Primary Cardiologist: Candee Furbish, MD ? ?Chart reviewed as part of pre-operative protocol coverage.  ? ?Attempted to call patient to review recommendations for holding warfarin prior to upcoming root canal scheduled for 01/27/2022.  No answer.  Left detailed message for patient with instructions to hold warfarin 3 days prior to procedure. ? ? ?Lenna Sciara, NP ?01/23/2022, 8:42 AM ? ? ?

## 2022-01-23 NOTE — Telephone Encounter (Signed)
Patient with diagnosis of aflutter on warfarin for anticoagulation.   ? ?Procedure: Root Canal ?Date of procedure: 01/27/22 ? ?CHA2DS2-VASc Score = 5  ? This indicates a 7.2% annual risk of stroke. ?The patient's score is based upon: ?CHF History: 1 ?HTN History: 1 ?Diabetes History: 1 ?Stroke History: 0 ?Vascular Disease History: 0 ?Age Score: 1 ?Gender Score: 1 ?  ?  ? ?Patient does NOT require pre-op antibiotics for dental procedure. ? ?Per office protocol, patient can hold warfarin for 3 days prior to procedure.   ? ?Patient will NOT need bridging with Lovenox (enoxaparin) around procedure. ? ? ?

## 2022-01-23 NOTE — Telephone Encounter (Signed)
Pt called to inquire about clearance for root canal on 4/11. ?Advised her that she can hold warfarin x 3 days prior to procedure, resume regular dosage on night of procedure and keep upcoming appt.  ?She verbalizes understanding and is appreciative. ?

## 2022-01-23 NOTE — Telephone Encounter (Signed)
? ? ?  Patient Name: Linda Washington  ?DOB: 01/12/1951 ?MRN: [id number] ? ?Primary Cardiologist: Candee Furbish, MD ? ?Chart reviewed as part of pre-operative protocol coverage.  ? ?Clinical pharmacist have reviewed past medical history, labs, medications as part of preoperative protocol coverage.   ? ?The following recommendations have been made: ? ?Patient with diagnosis of aflutter on warfarin for anticoagulation.   ?  ?Procedure: Root Canal ?Date of procedure: 01/27/22 ?  ?CHA2DS2-VASc Score = 5  ? This indicates a 7.2% annual risk of stroke. ?The patient's score is based upon: ?CHF History: 1 ?HTN History: 1 ?Diabetes History: 1 ?Stroke History: 0 ?Vascular Disease History: 0 ?Age Score: 1 ?Gender Score: 1 ?  ?  ?  ?Patient does NOT require pre-op antibiotics for dental procedure. ?  ?Per office protocol, patient can hold warfarin for 3 days prior to procedure.   ?  ?Patient will NOT need bridging with Lovenox (enoxaparin) around procedure. ? ?I will route this recommendation to the requesting party via Epic fax function and remove from pre-op pool. ? ?Please call with questions. ? ?Lenna Sciara, NP ?01/23/2022, 8:37 AM ? ?

## 2022-02-06 DIAGNOSIS — E538 Deficiency of other specified B group vitamins: Secondary | ICD-10-CM | POA: Diagnosis not present

## 2022-02-12 ENCOUNTER — Ambulatory Visit (INDEPENDENT_AMBULATORY_CARE_PROVIDER_SITE_OTHER): Payer: Medicare HMO

## 2022-02-12 ENCOUNTER — Other Ambulatory Visit: Payer: Self-pay | Admitting: Cardiology

## 2022-02-12 DIAGNOSIS — I4892 Unspecified atrial flutter: Secondary | ICD-10-CM | POA: Diagnosis not present

## 2022-02-12 DIAGNOSIS — Z5181 Encounter for therapeutic drug level monitoring: Secondary | ICD-10-CM | POA: Diagnosis not present

## 2022-02-12 LAB — POCT INR: INR: 2.7 (ref 2.0–3.0)

## 2022-02-12 NOTE — Patient Instructions (Signed)
Description   ?Continue taking Warfarin 1.5 tablets daily except 2 tablets on Sundays. ?Keep leafy veggies consistent.  ?Recheck INR in 4 weeks.  ?Coumadin Clinic [phone].  ?  ?   ?

## 2022-02-28 ENCOUNTER — Other Ambulatory Visit: Payer: Self-pay | Admitting: Cardiology

## 2022-02-28 DIAGNOSIS — I4892 Unspecified atrial flutter: Secondary | ICD-10-CM

## 2022-03-02 NOTE — Telephone Encounter (Signed)
Request for warfarin refill: ? ?Last INR on 02/12/22 was 2.7 ?Next INR due on 03/12/22 ?LOV 12/04/21  Lizbeth Bark MD ?

## 2022-03-12 ENCOUNTER — Ambulatory Visit (INDEPENDENT_AMBULATORY_CARE_PROVIDER_SITE_OTHER): Payer: Medicare HMO

## 2022-03-12 DIAGNOSIS — I4892 Unspecified atrial flutter: Secondary | ICD-10-CM

## 2022-03-12 DIAGNOSIS — Z5181 Encounter for therapeutic drug level monitoring: Secondary | ICD-10-CM

## 2022-03-12 LAB — POCT INR: INR: 2.9 (ref 2.0–3.0)

## 2022-03-12 NOTE — Patient Instructions (Signed)
Description   Continue taking Warfarin 1.5 tablets daily except 2 tablets on Sundays. Keep leafy veggies consistent. Recheck INR in 6 weeks. Coumadin Clinic [phone].

## 2022-03-25 DIAGNOSIS — R0781 Pleurodynia: Secondary | ICD-10-CM | POA: Diagnosis not present

## 2022-03-25 DIAGNOSIS — Z6841 Body Mass Index (BMI) 40.0 and over, adult: Secondary | ICD-10-CM | POA: Diagnosis not present

## 2022-04-15 ENCOUNTER — Other Ambulatory Visit: Payer: Medicare HMO

## 2022-04-22 ENCOUNTER — Encounter: Payer: Self-pay | Admitting: Podiatry

## 2022-04-22 ENCOUNTER — Ambulatory Visit: Payer: Medicare HMO | Admitting: Podiatry

## 2022-04-22 DIAGNOSIS — M2042 Other hammer toe(s) (acquired), left foot: Secondary | ICD-10-CM

## 2022-04-22 DIAGNOSIS — E119 Type 2 diabetes mellitus without complications: Secondary | ICD-10-CM

## 2022-04-22 DIAGNOSIS — B351 Tinea unguium: Secondary | ICD-10-CM

## 2022-04-22 DIAGNOSIS — M2010 Hallux valgus (acquired), unspecified foot: Secondary | ICD-10-CM | POA: Diagnosis not present

## 2022-04-22 DIAGNOSIS — M79675 Pain in left toe(s): Secondary | ICD-10-CM

## 2022-04-22 DIAGNOSIS — E1142 Type 2 diabetes mellitus with diabetic polyneuropathy: Secondary | ICD-10-CM

## 2022-04-22 DIAGNOSIS — M2041 Other hammer toe(s) (acquired), right foot: Secondary | ICD-10-CM

## 2022-04-22 NOTE — Progress Notes (Signed)
This patient returns to my office for at risk foot care.  This patient requires this care by a professional since this patient will be at risk due to having diabetes mellitus.and coagulation defect.   This patient is unable to cut nails herself since the patient cannot reach her nails.These nails are painful walking and wearing shoes.  This patient presents for at risk foot care today. ° °General Appearance  Alert, conversant and in no acute stress. ° °Vascular  Dorsalis pedis and posterior tibial  pulses are weakly  palpable  bilaterally.  Capillary return is within normal limits  bilaterally. Temperature is within normal limits  bilaterally. ° °Neurologic  Senn-Weinstein monofilament wire test diminished   bilaterally. Muscle power within normal limits bilaterally. ° °Nails Thick disfigured discolored nails with subungual debris  Hallux nails. No evidence of bacterial infection or drainage bilaterally. ° °Orthopedic  No limitations of motion  feet .  No crepitus or effusions noted.  HAV  B/L.  Hammer toes  B/L.  Haglunds deformity right foot.  Plantar flexed fifth metatarsal  B/L. ° °Skin  normotropic skin  noted bilaterally.  No signs of infections or ulcers noted.   Porokeratosis sub 5th met  B/L asymptomatic. ° °Onychomycosis  Pain in right toes  Pain in left toes   ° °Consent was obtained for treatment procedures.   Mechanical debridement of nails 1-5  bilaterally performed with a nail nipper.  Filed with dremel without incident. ° ° °Return office visit    3 months                  Told patient to return for periodic foot care and evaluation due to potential at risk complications. ° ° °Fuad Forget DPM  °

## 2022-04-23 ENCOUNTER — Ambulatory Visit (INDEPENDENT_AMBULATORY_CARE_PROVIDER_SITE_OTHER): Payer: Medicare HMO

## 2022-04-23 DIAGNOSIS — Z5181 Encounter for therapeutic drug level monitoring: Secondary | ICD-10-CM

## 2022-04-23 DIAGNOSIS — I4892 Unspecified atrial flutter: Secondary | ICD-10-CM

## 2022-04-23 LAB — POCT INR: INR: 5.1 — AB (ref 2.0–3.0)

## 2022-04-23 NOTE — Patient Instructions (Signed)
HOLD TODAY AND FRIDAY and then Continue taking Warfarin 1.5 tablets daily except 2 tablets on Sundays. Keep leafy veggies consistent. Recheck INR in 1 week. Coumadin Clinic [phone].

## 2022-04-27 ENCOUNTER — Ambulatory Visit: Payer: Medicare HMO | Admitting: Cardiology

## 2022-04-27 DIAGNOSIS — Z7901 Long term (current) use of anticoagulants: Secondary | ICD-10-CM

## 2022-04-27 DIAGNOSIS — I4892 Unspecified atrial flutter: Secondary | ICD-10-CM | POA: Diagnosis not present

## 2022-04-27 DIAGNOSIS — E785 Hyperlipidemia, unspecified: Secondary | ICD-10-CM

## 2022-04-27 DIAGNOSIS — G4733 Obstructive sleep apnea (adult) (pediatric): Secondary | ICD-10-CM | POA: Diagnosis not present

## 2022-04-27 DIAGNOSIS — I5032 Chronic diastolic (congestive) heart failure: Secondary | ICD-10-CM | POA: Diagnosis not present

## 2022-04-27 NOTE — Patient Instructions (Signed)
Medication Instructions:  Your physician recommends that you continue on your current medications as directed. Please refer to the Current Medication list given to you today.  *If you need a refill on your cardiac medications before your next appointment, please call your pharmacy*   Follow-Up: At [facility], you and your health needs are our priority.  As part of our continuing mission to provide you with exceptional heart care, we have created designated Provider Care Teams.  These Care Teams include your primary Cardiologist (physician) and Advanced Practice Providers (APPs -  Physician Assistants and Nurse Practitioners) who all work together to provide you with the care you need, when you need it.   Your next appointment:   1 year(s)  The format for your next appointment:   In Person  Provider:   Candee Furbish, MD     Important Information About Sugar

## 2022-04-27 NOTE — Assessment & Plan Note (Signed)
Currently on Coumadin.  Doing well without any bleeding issues.  Last INR 5.1.  Dose adjusted.

## 2022-04-27 NOTE — Assessment & Plan Note (Addendum)
Previously had cardioversion but with this was only successful for a few hours.  She is maintaining good rate control without any difficulties.  Continue with carvedilol 3.125 mg twice a day for prescription drug management of rate control

## 2022-04-27 NOTE — Assessment & Plan Note (Signed)
Taking the atorvastatin 40 mg.  Last LDL 48.  Excellent.  Hemoglobin A1c 7.2 hemoglobin 12.1.  Creatinine 0.8.

## 2022-04-27 NOTE — Progress Notes (Signed)
Cardiology Office Note:    Date:  04/27/2022   ID:  Jodi Mourning, DOB 29-Apr-1951, MRN [id number]  PCP:  Marda Stalker, PA-C   [facility] Providers Cardiologist:  Candee Furbish, MD     Referring MD: Marda Stalker, PA-C    History of Present Illness:    Linda Washington is a 71 y.o. female here for follow-up of persistent atrial flutter, chronic diastolic heart failure.  She underwent TEE cardioversion and then quickly reverted back to atrial flutter the next day.  Overall she has been maintaining good rate control.  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  Had back pain.  Has had back injections.  Unfortunately had a significant fall with left rib fractures and hemothorax.  This was in November 2022.  CT of chest personally reviewed with her.  Hemothorax noted.  It has been slow healing.  She showed me pictures of her severe hematoma/skin ecchymoses.  Cardiac catheterization in 2018 showed normal arteries and normal LV.  Her father had heart failure as well as her mother at age 37.  Prior LDL 49.    Past Medical History:  Diagnosis Date   Acute on chronic diastolic (congestive) heart failure ([place]) 06/2018   Anxiety    takes Citalopram daily   Arthritis    generalized   Basal cell carcinoma    GERD (gastroesophageal reflux disease)    takes Omeprazole daily   History of Bell's palsy    left side   History of bronchitis    History of hiatal hernia    History of kidney stones    Hyperlipemia    takes Simvastatin daily   Hypertension    takes Enalapril daily   Hypothyroidism    Incontinence    takes Ditropan daily   Joint swelling    left knee   Neuropathy    takes Gabapentin daily   PONV (postoperative nausea and vomiting)    RLS (restless legs syndrome)    Seasonal allergies    takes Claritin daioy as needed   Sleep apnea    pt. states that she does not have a CPAP   Tremor    Type 2 diabetes mellitus ([place])    takes Metformin and  Amaryl daily    Past Surgical History:  Procedure Laterality Date   ACHILLES TENDON REPAIR Right    APPENDECTOMY     BASAL CELL CARCINOMA EXCISION     Nose   BREAST LUMPECTOMY Left    CARDIAC CATHETERIZATION     pt. states approximately 15 years ago   CARDIOVERSION N/A 06/22/2018   Procedure: CARDIOVERSION;  Surgeon: Buford Dresser, MD;  Location: [facility] ENDOSCOPY;  Service: Cardiovascular;  Laterality: N/A;   CARPAL TUNNEL RELEASE Bilateral    DILATION AND CURETTAGE OF UTERUS     EYE SURGERY Bilateral    cataract surgery   KNEE ARTHROSCOPY     Right   LEFT HEART CATH AND CORONARY ANGIOGRAPHY N/A 02/01/2017   Procedure: Left Heart Cath and Coronary Angiography;  Surgeon: Lorretta Harp, MD;  Location: [place] CV LAB;  Service: Cardiovascular;  Laterality: N/A;   LITHOTRIPSY  2013   TEE WITHOUT CARDIOVERSION N/A 06/22/2018   Procedure: TRANSESOPHAGEAL ECHOCARDIOGRAM (TEE);  Surgeon: Buford Dresser, MD;  Location: [facility] ENDOSCOPY;  Service: Cardiovascular;  Laterality: N/A;   TOTAL ABDOMINAL HYSTERECTOMY     TOTAL KNEE ARTHROPLASTY Left 04/08/2015   Procedure: LEFT TOTAL KNEE ARTHROPLASTY;  Surgeon: Frederik Pear, MD;  Location:  [place] OR;  Service: Orthopedics;  Laterality: Left;    Current Medications: Current Meds  Medication Sig   acetaminophen (TYLENOL) 650 MG CR tablet Take 650 mg by mouth every 8 (eight) hours as needed for pain.   atorvastatin (LIPITOR) 40 MG tablet TAKE 1 TABLET AT BEDTIME   carvedilol (COREG) 3.125 MG tablet TAKE 1 TABLET TWICE DAILY WITH MEALS.   citalopram (CELEXA) 20 MG tablet Take 20 mg by mouth daily.   Cyanocobalamin (VITAMIN B 12 PO) Take 1,000 mg by mouth daily.   fluticasone (FLONASE) 50 MCG/ACT nasal spray Place 1-2 sprays into both nostrils daily as needed for allergies or rhinitis.   furosemide (LASIX) 40 MG tablet Take 1 tablet (40 mg total) by mouth daily. Pt must keep upcoming appt in July 2023 with Dr. Marlou Porch before anymore refills.  Thank you Final Attempt   metFORMIN (GLUCOPHAGE) 1000 MG tablet Take 1,000 mg by mouth 2 (two) times daily with a meal.   NOVOLOG FLEXPEN 100 UNIT/ML FlexPen Inject 4 Units into the skin daily as needed. If Blood Sugar>200=Take 4 units   omeprazole (PRILOSEC) 20 MG capsule Take 20 mg by mouth daily.   oxybutynin (DITROPAN XL) 15 MG 24 hr tablet Take 15 mg by mouth daily.   Polyethyl Glycol-Propyl Glycol (SYSTANE OP) Place 1-2 drops into both eyes daily as needed (dry eyes).   TRULICITY 3.29 JM/4.2AS SOPN Inject 0.75 mg into the skin once a week. Take on Wed   warfarin (COUMADIN) 5 MG tablet TAKE AS DIRECTED BY COUMADIN CLINIC     Allergies:   Cyclobenzaprine, Gabapentin, Hydrocodone, Methocarbamol, Oxycodone, Tinidazole, and Latex   Social History   Socioeconomic History   Marital status: Divorced    Spouse name: Not on file   Number of children: Not on file   Years of education: Some colle   Highest education level: Not on file  Occupational History   Occupation: part-time receptionist at [employer]  Tobacco Use   Smoking status: Never   Smokeless tobacco: Never  Vaping Use   Vaping Use: Never used  Substance and Sexual Activity   Alcohol use: Yes    Alcohol/week: 0.0 standard drinks of alcohol    Comment: Rare-wine   Drug use: No   Sexual activity: Not on file  Other Topics Concern   Not on file  Social History Narrative   Lives at home with Lonn Georgia, roommate   Caffeine use: 1 Coffee day, 1-2 a day of tea, 1-2 sodas a day    Social Determinants of Health   Financial Resource Strain: Not on file  Food Insecurity: Not on file  Transportation Needs: Not on file  Physical Activity: Not on file  Stress: Not on file  Social Connections: Not on file     Family History: The patient's family history includes CAD (age of onset: 11) in her brother; Heart disease in her father and mother; Heart disease (age of onset: 82) in her brother; Heart failure in her father;  Heart failure (age of onset: 27) in her mother.  ROS:   Please see the history of present illness.     All other systems reviewed and are negative.  EKGs/Labs/Other Studies Reviewed:    The following studies were reviewed today:  Persistent atrial flutter - Cardioversion only successful for a few hours. - Continue with Coumadin, used to be on Xarelto -Well rate controlled.  Hemoglobin 12.4 creatinine 1.1   Chronic anticoagulation - On Coumadin last INR 1.5.  She is seeing them today.   Chronic diastolic heart failure - EF 50 to 55%.  During a prior hospitalization diuresed 7 L.   Hyperlipidemia - On atorvastatin 40 mg-LDL 49 from outside labs.  No myalgias.  Excellent.  ALT 12.   Obstructive sleep apnea - Dr. Radford Pax.  Previously recommended BiPAP titration   Morbid obesity - Difficult to lose weight now with back pain.  We will refer [facility] clinic at [facility].   Lumbar back pain - Dr. Herma Mering will be performing lumbar spine injections.  She will need Coumadin holds for these.  We will coordinate this with our pharmacy clinic.  EKG:  EKG is  ordered today.  The ekg ordered today demonstrates atrial flutter with 3-1 conduction.  Small P waves noted.  65 nonspecific ST-T wave changes  Recent Labs: 09/03/2021: ALT 14; BUN 15; Creatinine, Ser 0.87; Potassium 4.1; Sodium 138 09/07/2021: Hemoglobin 8.0; Platelets 301  Recent Lipid Panel    Component Value Date/Time   CHOL 115 10/31/2019 1653   TRIG 65 10/31/2019 1653   HDL 52 10/31/2019 1653   CHOLHDL 2.2 10/31/2019 1653   LDLCALC 49 10/31/2019 1653     Risk Assessment/Calculations:              Physical Exam:    VS:  BP 120/80 (BP Location: Left Arm, Patient Position: Sitting, Cuff Size: Normal)   Pulse 65   Ht '5\' 8"'$  (1.727 m)   Wt 288 lb (130.6 kg)   BMI 43.79 kg/m     Wt Readings from Last 3 Encounters:  04/27/22 288 lb (130.6 kg)  09/03/21 281 lb 15.5 oz (127.9 kg)  10/30/20 282 lb (127.9 kg)      GEN:  Well nourished, well developed in no acute distress, ambulates with a cane HEENT: Normal NECK: No JVD; No carotid bruits LYMPHATICS: No lymphadenopathy CARDIAC: RRR, no murmurs, no rubs, gallops RESPIRATORY:  Clear to auscultation without rales, wheezing or rhonchi  ABDOMEN: Soft, non-tender, non-distended MUSCULOSKELETAL:  No edema; No deformity  SKIN: Warm and dry NEUROLOGIC:  Alert and oriented x 3 PSYCHIATRIC:  Normal affect   ASSESSMENT:    1. Atrial flutter with controlled response ([place])   2. Chronic diastolic heart failure ([place])   3. Long term (current) use of anticoagulants   4. OSA (obstructive sleep apnea)   5. Dyslipidemia    PLAN:    In order of problems listed above:  Atrial flutter with controlled response ([place]) Previously had cardioversion but with this was only successful for a few hours.  She is maintaining good rate control without any difficulties.  Continue with carvedilol 3.125 mg twice a day for prescription drug management of rate control  Chronic diastolic heart failure (HCC) Stable, EF previously 50 to 55%.  During a prior hospitalization was diuresed 7 L.  In the future, could consider SGLT2 inhibitor.  Overall doing well.  Long term (current) use of anticoagulants Currently on Coumadin.  Doing well without any bleeding issues.  Last INR 5.1.  Dose adjusted.  OSA (obstructive sleep apnea) Dr. Radford Pax as previously recommended BiPAP titration.  Dyslipidemia Taking the atorvastatin 40 mg.  Last LDL 48.  Excellent.  Hemoglobin A1c 7.2 hemoglobin 12.1.  Creatinine 0.8.     1 year follow-up   Medication Adjustments/Labs and Tests Ordered: Current medicines are reviewed at length with the patient today.  Concerns regarding medicines are outlined above.  Orders Placed This Encounter  Procedures   EKG 12-Lead  No orders of the defined types were placed in this encounter.   Patient Instructions  Medication Instructions:  Your physician  recommends that you continue on your current medications as directed. Please refer to the Current Medication list given to you today.  *If you need a refill on your cardiac medications before your next appointment, please call your pharmacy*   Follow-Up: At [facility], you and your health needs are our priority.  As part of our continuing mission to provide you with exceptional heart care, we have created designated Provider Care Teams.  These Care Teams include your primary Cardiologist (physician) and Advanced Practice Providers (APPs -  Physician Assistants and Nurse Practitioners) who all work together to provide you with the care you need, when you need it.   Your next appointment:   1 year(s)  The format for your next appointment:   In Person  Provider:   Candee Furbish, MD     Important Information About Sugar         Signed, Candee Furbish, MD  04/27/2022 9:54 AM    [place]

## 2022-04-27 NOTE — Assessment & Plan Note (Signed)
Stable, EF previously 50 to 55%.  During a prior hospitalization was diuresed 7 L.  In the future, could consider SGLT2 inhibitor.  Overall doing well.

## 2022-04-27 NOTE — Assessment & Plan Note (Signed)
Dr. Radford Pax as previously recommended BiPAP titration.

## 2022-05-05 ENCOUNTER — Ambulatory Visit (INDEPENDENT_AMBULATORY_CARE_PROVIDER_SITE_OTHER): Payer: Medicare HMO | Admitting: *Deleted

## 2022-05-05 DIAGNOSIS — Z5181 Encounter for therapeutic drug level monitoring: Secondary | ICD-10-CM

## 2022-05-05 DIAGNOSIS — I4892 Unspecified atrial flutter: Secondary | ICD-10-CM | POA: Diagnosis not present

## 2022-05-05 LAB — POCT INR: INR: 5 — AB (ref 2.0–3.0)

## 2022-05-05 NOTE — Patient Instructions (Signed)
Description   Do not take any Warfarin and No Warfarin tomorrow then start taking Warfarin 1.5 tablets daily except 1 tablet on Sundays. Keep leafy veggies consistent. Recheck INR in 1 week. Coumadin Clinic [phone].

## 2022-05-09 ENCOUNTER — Other Ambulatory Visit: Payer: Self-pay | Admitting: Cardiology

## 2022-05-15 ENCOUNTER — Ambulatory Visit (INDEPENDENT_AMBULATORY_CARE_PROVIDER_SITE_OTHER): Payer: Medicare HMO | Admitting: *Deleted

## 2022-05-15 DIAGNOSIS — I4892 Unspecified atrial flutter: Secondary | ICD-10-CM | POA: Diagnosis not present

## 2022-05-15 DIAGNOSIS — Z5181 Encounter for therapeutic drug level monitoring: Secondary | ICD-10-CM | POA: Diagnosis not present

## 2022-05-15 LAB — POCT INR: INR: 3.2 — AB (ref 2.0–3.0)

## 2022-05-15 NOTE — Patient Instructions (Signed)
Description   Today take 1 tablet then start taking the dose of Warfarin 1.5 tablets daily except 1 tablet on Sundays. Keep leafy veggies consistent. Recheck INR in 2 weeks. Coumadin Clinic [phone] or [phone].

## 2022-05-29 ENCOUNTER — Ambulatory Visit (INDEPENDENT_AMBULATORY_CARE_PROVIDER_SITE_OTHER): Payer: Medicare HMO

## 2022-05-29 DIAGNOSIS — I4892 Unspecified atrial flutter: Secondary | ICD-10-CM | POA: Diagnosis not present

## 2022-05-29 DIAGNOSIS — Z5181 Encounter for therapeutic drug level monitoring: Secondary | ICD-10-CM | POA: Diagnosis not present

## 2022-05-29 LAB — POCT INR: INR: 5.4 — AB (ref 2.0–3.0)

## 2022-05-29 NOTE — Patient Instructions (Signed)
HOLD TONIGHT AND SATURDAY and then continue Warfarin 1.5 tablets daily except 1 tablet on Sundays. Keep leafy veggies consistent. Recheck INR in 2 weeks. Coumadin Clinic [phone] or [phone].

## 2022-06-12 ENCOUNTER — Ambulatory Visit (INDEPENDENT_AMBULATORY_CARE_PROVIDER_SITE_OTHER): Payer: Medicare HMO | Admitting: *Deleted

## 2022-06-12 DIAGNOSIS — I4892 Unspecified atrial flutter: Secondary | ICD-10-CM | POA: Diagnosis not present

## 2022-06-12 DIAGNOSIS — Z5181 Encounter for therapeutic drug level monitoring: Secondary | ICD-10-CM | POA: Diagnosis not present

## 2022-06-12 DIAGNOSIS — H43812 Vitreous degeneration, left eye: Secondary | ICD-10-CM | POA: Diagnosis not present

## 2022-06-12 LAB — POCT INR: INR: 4 — AB (ref 2.0–3.0)

## 2022-06-12 NOTE — Patient Instructions (Addendum)
Description   Hold today's dose of Warfarin then start taking Warfarin 1.5 tablets daily except 1 tablet on Sundays, Tuesdays, and Thursdays. Keep leafy veggies consistent. Recheck INR in 2 weeks. Coumadin Clinic [phone] or [phone].

## 2022-06-15 DIAGNOSIS — E1142 Type 2 diabetes mellitus with diabetic polyneuropathy: Secondary | ICD-10-CM | POA: Diagnosis not present

## 2022-06-24 ENCOUNTER — Ambulatory Visit: Payer: Medicare HMO | Attending: Cardiology

## 2022-06-24 DIAGNOSIS — I4892 Unspecified atrial flutter: Secondary | ICD-10-CM | POA: Diagnosis not present

## 2022-06-24 DIAGNOSIS — Z5181 Encounter for therapeutic drug level monitoring: Secondary | ICD-10-CM

## 2022-06-24 LAB — POCT INR: INR: 2.5 (ref 2.0–3.0)

## 2022-06-24 NOTE — Patient Instructions (Signed)
Description   Continue taking Warfarin 1.5 tablets daily except 1 tablet on Sundays, Tuesdays, and Thursdays. Keep leafy veggies consistent.  Recheck INR in 3 weeks. Coumadin Clinic [phone].

## 2022-07-02 DIAGNOSIS — S40262A Insect bite (nonvenomous) of left shoulder, initial encounter: Secondary | ICD-10-CM | POA: Diagnosis not present

## 2022-07-02 DIAGNOSIS — W57XXXA Bitten or stung by nonvenomous insect and other nonvenomous arthropods, initial encounter: Secondary | ICD-10-CM | POA: Diagnosis not present

## 2022-07-15 ENCOUNTER — Ambulatory Visit: Payer: Medicare HMO | Attending: Cardiology

## 2022-07-15 DIAGNOSIS — Z5181 Encounter for therapeutic drug level monitoring: Secondary | ICD-10-CM

## 2022-07-15 DIAGNOSIS — I4892 Unspecified atrial flutter: Secondary | ICD-10-CM

## 2022-07-15 LAB — POCT INR: INR: 2.7 (ref 2.0–3.0)

## 2022-07-15 NOTE — Patient Instructions (Signed)
Description   Continue taking Warfarin 1.5 tablets daily except 1 tablet on Sundays, Tuesdays, and Thursdays. Keep leafy veggies consistent.  Recheck INR in 4 weeks. Coumadin Clinic [phone].

## 2022-07-16 DIAGNOSIS — M545 Low back pain, unspecified: Secondary | ICD-10-CM | POA: Diagnosis not present

## 2022-07-16 DIAGNOSIS — M1711 Unilateral primary osteoarthritis, right knee: Secondary | ICD-10-CM | POA: Diagnosis not present

## 2022-07-20 ENCOUNTER — Telehealth: Payer: Self-pay | Admitting: Podiatry

## 2022-07-20 NOTE — Telephone Encounter (Signed)
Pt called to cancel appt for Friday with Dr Prudence Davidson and then asked about status of diabetic shoes she was measured in July.

## 2022-07-21 DIAGNOSIS — R0981 Nasal congestion: Secondary | ICD-10-CM | POA: Diagnosis not present

## 2022-07-21 DIAGNOSIS — Z03818 Encounter for observation for suspected exposure to other biological agents ruled out: Secondary | ICD-10-CM | POA: Diagnosis not present

## 2022-07-21 DIAGNOSIS — R059 Cough, unspecified: Secondary | ICD-10-CM | POA: Diagnosis not present

## 2022-07-21 DIAGNOSIS — M546 Pain in thoracic spine: Secondary | ICD-10-CM | POA: Diagnosis not present

## 2022-07-21 DIAGNOSIS — J189 Pneumonia, unspecified organism: Secondary | ICD-10-CM | POA: Diagnosis not present

## 2022-07-21 DIAGNOSIS — R5383 Other fatigue: Secondary | ICD-10-CM | POA: Diagnosis not present

## 2022-07-21 NOTE — Telephone Encounter (Signed)
Called patient to let her know that Dr. Buddy Duty has not signed off on her paperwork to get her shoes. I will refax them again today and she will call Dr. Cindra Eves office to request a signature from him. She states she just saw him yesterday.

## 2022-07-23 DIAGNOSIS — J189 Pneumonia, unspecified organism: Secondary | ICD-10-CM | POA: Diagnosis not present

## 2022-07-24 ENCOUNTER — Ambulatory Visit: Payer: Medicare HMO | Admitting: Podiatry

## 2022-07-24 ENCOUNTER — Telehealth: Payer: Self-pay

## 2022-07-24 NOTE — Telephone Encounter (Signed)
Patient diagnosed with Pneumonia and has started Doxycycline.    I was going to schedule her for INR check next week, but she says that she feels "lousy" and will call early next week to schedule, if feeling better.    I recommended Vitamin K food increase in time being.  She verbalized understanding.

## 2022-08-04 DIAGNOSIS — J189 Pneumonia, unspecified organism: Secondary | ICD-10-CM | POA: Diagnosis not present

## 2022-08-04 DIAGNOSIS — J209 Acute bronchitis, unspecified: Secondary | ICD-10-CM | POA: Diagnosis not present

## 2022-08-12 ENCOUNTER — Ambulatory Visit: Payer: Medicare HMO | Attending: Cardiology | Admitting: *Deleted

## 2022-08-12 DIAGNOSIS — Z5181 Encounter for therapeutic drug level monitoring: Secondary | ICD-10-CM | POA: Diagnosis not present

## 2022-08-12 DIAGNOSIS — I4892 Unspecified atrial flutter: Secondary | ICD-10-CM | POA: Diagnosis not present

## 2022-08-12 LAB — POCT INR: INR: 1.9 — AB (ref 2.0–3.0)

## 2022-08-12 NOTE — Patient Instructions (Signed)
Description   Today take 2 tablets then continue taking Warfarin 1.5 tablets daily except 1 tablet on Sundays, Tuesdays, and Thursdays. Keep leafy veggies consistent. Recheck INR in 4 weeks. Coumadin Clinic [phone].

## 2022-08-13 DIAGNOSIS — M545 Low back pain, unspecified: Secondary | ICD-10-CM | POA: Diagnosis not present

## 2022-08-13 DIAGNOSIS — M1711 Unilateral primary osteoarthritis, right knee: Secondary | ICD-10-CM | POA: Diagnosis not present

## 2022-08-15 ENCOUNTER — Ambulatory Visit (INDEPENDENT_AMBULATORY_CARE_PROVIDER_SITE_OTHER): Payer: Medicare HMO

## 2022-08-15 ENCOUNTER — Ambulatory Visit: Payer: Medicare HMO | Admitting: Podiatry

## 2022-08-15 DIAGNOSIS — R52 Pain, unspecified: Secondary | ICD-10-CM | POA: Diagnosis not present

## 2022-08-15 DIAGNOSIS — M7751 Other enthesopathy of right foot: Secondary | ICD-10-CM

## 2022-08-15 MED ORDER — BETAMETHASONE SOD PHOS & ACET 6 (3-3) MG/ML IJ SUSP: 3.0000 mg | Freq: Once | INTRAMUSCULAR | Status: AC

## 2022-08-15 NOTE — Progress Notes (Signed)
Chief Complaint  Patient presents with   Foot Pain    Rt ankle pain  Pt stated she does not remembering injuring it but it is sore and swollen     Subjective:  71 y.o. female presenting today for new complaint of pain and tenderness associated to the patient's right ankle.  She says that earlier this week she sustained a slight ankle sprain.  She cannot recall the exact incident but she believes she stepped wrong which created some pain and tenderness to the right ankle.  She presents for further treatment and evaluation   Past Medical History:  Diagnosis Date   Acute on chronic diastolic (congestive) heart failure ([place]) 06/2018   Anxiety    takes Citalopram daily   Arthritis    generalized   Basal cell carcinoma    GERD (gastroesophageal reflux disease)    takes Omeprazole daily   History of Bell's palsy    left side   History of bronchitis    History of hiatal hernia    History of kidney stones    Hyperlipemia    takes Simvastatin daily   Hypertension    takes Enalapril daily   Hypothyroidism    Incontinence    takes Ditropan daily   Joint swelling    left knee   Neuropathy    takes Gabapentin daily   PONV (postoperative nausea and vomiting)    RLS (restless legs syndrome)    Seasonal allergies    takes Claritin daioy as needed   Sleep apnea    pt. states that she does not have a CPAP   Tremor    Type 2 diabetes mellitus ([place])    takes Metformin and Amaryl daily    Past Surgical History:  Procedure Laterality Date   ACHILLES TENDON REPAIR Right    APPENDECTOMY     BASAL CELL CARCINOMA EXCISION     Nose   BREAST LUMPECTOMY Left    CARDIAC CATHETERIZATION     pt. states approximately 15 years ago   CARDIOVERSION N/A 06/22/2018   Procedure: CARDIOVERSION;  Surgeon: Buford Dresser, MD;  Location: [facility] ENDOSCOPY;  Service: Cardiovascular;  Laterality: N/A;   CARPAL TUNNEL RELEASE Bilateral    DILATION AND CURETTAGE OF UTERUS     EYE SURGERY Bilateral     cataract surgery   KNEE ARTHROSCOPY     Right   LEFT HEART CATH AND CORONARY ANGIOGRAPHY N/A 02/01/2017   Procedure: Left Heart Cath and Coronary Angiography;  Surgeon: Lorretta Harp, MD;  Location: [place] CV LAB;  Service: Cardiovascular;  Laterality: N/A;   LITHOTRIPSY  2013   TEE WITHOUT CARDIOVERSION N/A 06/22/2018   Procedure: TRANSESOPHAGEAL ECHOCARDIOGRAM (TEE);  Surgeon: Buford Dresser, MD;  Location: [facility] ENDOSCOPY;  Service: Cardiovascular;  Laterality: N/A;   TOTAL ABDOMINAL HYSTERECTOMY     TOTAL KNEE ARTHROPLASTY Left 04/08/2015   Procedure: LEFT TOTAL KNEE ARTHROPLASTY;  Surgeon: Frederik Pear, MD;  Location: [place];  Service: Orthopedics;  Laterality: Left;    Allergies  Allergen Reactions   Cyclobenzaprine Other (See Comments)   Gabapentin Other (See Comments)   Hydrocodone Other (See Comments)    Weird sensations mentally; "makes me fuzzy"   Methocarbamol Hives   Oxycodone Other (See Comments)    Weird sensations mentally; "makes me fuzzy"   Tinidazole     Other reaction(s): hives   Latex Hives and Rash    Objective / Physical Exam:  General:  The patient is alert and oriented x3  in no acute distress. Dermatology:  Skin is warm, dry and supple bilateral lower extremities. Negative for open lesions or macerations. Vascular:  Palpable pedal pulses bilaterally. No edema or erythema noted. Capillary refill within normal limits. Neurological:  Epicritic and protective threshold grossly intact bilaterally.  Musculoskeletal Exam:  Pain on palpation to the right ankle. Mild edema noted. Range of motion within normal limits to all pedal and ankle joints bilateral. Muscle strength 5/5 in all groups bilateral.   Radiographic Exam RT ankle 08/07/2022:  Normal osseous mineralization. Joint spaces preserved.  No acute fractures identified.  2 anchors are not embedded within the posterior tubercle of the calcaneus with cortical irregularity along the posterior  tubercle of the calcaneus postsurgical.  Appears chronic  Assessment: 1.  Ankle sprain/capsulitis right  Plan of Care:  1. Patient was evaluated. X-Rays reviewed.  2. Injection of 0.5 mL Celestone Soluspan injected in the patient's right ankle. 3.  Cam boot dispensed.  WBAT x3 weeks 4.  Recommend Tylenol OTC as needed 5.  Return to clinic 4 weeks   Edrick Kins, DPM [facility]  Dr. Edrick Kins, DPM    [address], [place] [address]                Office [phone]  Fax [phone]

## 2022-09-01 DIAGNOSIS — I10 Essential (primary) hypertension: Secondary | ICD-10-CM | POA: Diagnosis not present

## 2022-09-01 DIAGNOSIS — K219 Gastro-esophageal reflux disease without esophagitis: Secondary | ICD-10-CM | POA: Diagnosis not present

## 2022-09-01 DIAGNOSIS — I4892 Unspecified atrial flutter: Secondary | ICD-10-CM | POA: Diagnosis not present

## 2022-09-01 DIAGNOSIS — G4730 Sleep apnea, unspecified: Secondary | ICD-10-CM | POA: Diagnosis not present

## 2022-09-01 DIAGNOSIS — E039 Hypothyroidism, unspecified: Secondary | ICD-10-CM | POA: Diagnosis not present

## 2022-09-01 DIAGNOSIS — E7800 Pure hypercholesterolemia, unspecified: Secondary | ICD-10-CM | POA: Diagnosis not present

## 2022-09-01 DIAGNOSIS — Z0000 Encounter for general adult medical examination without abnormal findings: Secondary | ICD-10-CM | POA: Diagnosis not present

## 2022-09-01 DIAGNOSIS — E1142 Type 2 diabetes mellitus with diabetic polyneuropathy: Secondary | ICD-10-CM | POA: Diagnosis not present

## 2022-09-01 DIAGNOSIS — Z6841 Body Mass Index (BMI) 40.0 and over, adult: Secondary | ICD-10-CM | POA: Diagnosis not present

## 2022-09-01 DIAGNOSIS — Z1389 Encounter for screening for other disorder: Secondary | ICD-10-CM | POA: Diagnosis not present

## 2022-09-01 DIAGNOSIS — N3281 Overactive bladder: Secondary | ICD-10-CM | POA: Diagnosis not present

## 2022-09-01 DIAGNOSIS — Z23 Encounter for immunization: Secondary | ICD-10-CM | POA: Diagnosis not present

## 2022-09-01 DIAGNOSIS — Z7901 Long term (current) use of anticoagulants: Secondary | ICD-10-CM | POA: Diagnosis not present

## 2022-09-09 ENCOUNTER — Ambulatory Visit: Payer: Medicare HMO | Attending: Cardiology | Admitting: *Deleted

## 2022-09-09 DIAGNOSIS — Z5181 Encounter for therapeutic drug level monitoring: Secondary | ICD-10-CM | POA: Diagnosis not present

## 2022-09-09 DIAGNOSIS — I4892 Unspecified atrial flutter: Secondary | ICD-10-CM | POA: Diagnosis not present

## 2022-09-09 LAB — POCT INR: INR: 2 (ref 2.0–3.0)

## 2022-09-09 NOTE — Patient Instructions (Addendum)
Description   Today take 2 tablets then continue taking Warfarin 1.5 tablets daily except 1 tablet on Sundays, Tuesdays, and Thursdays. Keep leafy veggies consistent. Recheck INR in 5 weeks. Coumadin Clinic [phone].

## 2022-09-26 DIAGNOSIS — J069 Acute upper respiratory infection, unspecified: Secondary | ICD-10-CM | POA: Diagnosis not present

## 2022-09-28 ENCOUNTER — Inpatient Hospital Stay: Admission: RE | Admit: 2022-09-28 | Payer: Medicare HMO | Source: Ambulatory Visit

## 2022-09-28 ENCOUNTER — Other Ambulatory Visit: Payer: Self-pay | Admitting: Family Medicine

## 2022-09-28 DIAGNOSIS — S2242XA Multiple fractures of ribs, left side, initial encounter for closed fracture: Secondary | ICD-10-CM

## 2022-10-05 ENCOUNTER — Other Ambulatory Visit: Payer: Self-pay | Admitting: Internal Medicine

## 2022-10-05 DIAGNOSIS — I4892 Unspecified atrial flutter: Secondary | ICD-10-CM

## 2022-10-07 DIAGNOSIS — M25571 Pain in right ankle and joints of right foot: Secondary | ICD-10-CM | POA: Diagnosis not present

## 2022-10-14 ENCOUNTER — Ambulatory Visit: Payer: Medicare HMO | Attending: Cardiology | Admitting: *Deleted

## 2022-10-14 DIAGNOSIS — R1319 Other dysphagia: Secondary | ICD-10-CM | POA: Diagnosis not present

## 2022-10-14 DIAGNOSIS — I4892 Unspecified atrial flutter: Secondary | ICD-10-CM

## 2022-10-14 DIAGNOSIS — Z5181 Encounter for therapeutic drug level monitoring: Secondary | ICD-10-CM | POA: Diagnosis not present

## 2022-10-14 DIAGNOSIS — K219 Gastro-esophageal reflux disease without esophagitis: Secondary | ICD-10-CM | POA: Diagnosis not present

## 2022-10-14 LAB — POCT INR: INR: 1.7 — AB (ref 2.0–3.0)

## 2022-10-14 NOTE — Patient Instructions (Signed)
Description   Today take 2 tablets then continue taking Warfarin 1.5 tablets daily except 1 tablet on Sundays, Tuesdays, and Thursdays. Keep leafy veggies consistent. Recheck INR in 3 weeks. Coumadin Clinic [phone].

## 2022-10-16 DIAGNOSIS — M25571 Pain in right ankle and joints of right foot: Secondary | ICD-10-CM | POA: Diagnosis not present

## 2022-10-21 DIAGNOSIS — M25571 Pain in right ankle and joints of right foot: Secondary | ICD-10-CM | POA: Diagnosis not present

## 2022-10-26 DIAGNOSIS — M25571 Pain in right ankle and joints of right foot: Secondary | ICD-10-CM | POA: Diagnosis not present

## 2022-10-26 DIAGNOSIS — R2689 Other abnormalities of gait and mobility: Secondary | ICD-10-CM | POA: Diagnosis not present

## 2022-10-26 DIAGNOSIS — M6281 Muscle weakness (generalized): Secondary | ICD-10-CM | POA: Diagnosis not present

## 2022-10-27 DIAGNOSIS — H02402 Unspecified ptosis of left eyelid: Secondary | ICD-10-CM | POA: Diagnosis not present

## 2022-10-27 DIAGNOSIS — H0100A Unspecified blepharitis right eye, upper and lower eyelids: Secondary | ICD-10-CM | POA: Diagnosis not present

## 2022-10-27 DIAGNOSIS — H0100B Unspecified blepharitis left eye, upper and lower eyelids: Secondary | ICD-10-CM | POA: Diagnosis not present

## 2022-10-27 DIAGNOSIS — E119 Type 2 diabetes mellitus without complications: Secondary | ICD-10-CM | POA: Diagnosis not present

## 2022-10-27 DIAGNOSIS — H43813 Vitreous degeneration, bilateral: Secondary | ICD-10-CM | POA: Diagnosis not present

## 2022-10-29 ENCOUNTER — Telehealth: Payer: Self-pay | Admitting: Podiatry

## 2022-10-29 ENCOUNTER — Encounter: Payer: Self-pay | Admitting: Podiatry

## 2022-10-29 NOTE — Telephone Encounter (Signed)
Left msg on voicemail that we have be unable to obtain paperwork from physician maintaining her diabetes.  Diabetic shoe order will be cancelled.

## 2022-11-04 ENCOUNTER — Ambulatory Visit: Payer: Medicare HMO | Attending: Cardiology | Admitting: *Deleted

## 2022-11-04 DIAGNOSIS — I4892 Unspecified atrial flutter: Secondary | ICD-10-CM | POA: Diagnosis not present

## 2022-11-04 DIAGNOSIS — Z5181 Encounter for therapeutic drug level monitoring: Secondary | ICD-10-CM | POA: Diagnosis not present

## 2022-11-04 LAB — POCT INR: INR: 1.7 — AB (ref 2.0–3.0)

## 2022-11-04 NOTE — Patient Instructions (Signed)
Description   Today take 2 tablets then START taking Warfarin 1.5 tablets daily except 1 tablet on Sundays and Thursdays. Keep leafy veggies consistent. Recheck INR in 3 weeks. Coumadin Clinic [phone].

## 2022-11-06 ENCOUNTER — Other Ambulatory Visit: Payer: Self-pay | Admitting: Family Medicine

## 2022-11-06 DIAGNOSIS — Z1231 Encounter for screening mammogram for malignant neoplasm of breast: Secondary | ICD-10-CM

## 2022-11-11 DIAGNOSIS — R2689 Other abnormalities of gait and mobility: Secondary | ICD-10-CM | POA: Diagnosis not present

## 2022-11-11 DIAGNOSIS — M25571 Pain in right ankle and joints of right foot: Secondary | ICD-10-CM | POA: Diagnosis not present

## 2022-11-11 DIAGNOSIS — M6281 Muscle weakness (generalized): Secondary | ICD-10-CM | POA: Diagnosis not present

## 2022-11-18 DIAGNOSIS — M25571 Pain in right ankle and joints of right foot: Secondary | ICD-10-CM | POA: Diagnosis not present

## 2022-11-18 DIAGNOSIS — R2689 Other abnormalities of gait and mobility: Secondary | ICD-10-CM | POA: Diagnosis not present

## 2022-11-18 DIAGNOSIS — M6281 Muscle weakness (generalized): Secondary | ICD-10-CM | POA: Diagnosis not present

## 2022-11-24 DIAGNOSIS — M25571 Pain in right ankle and joints of right foot: Secondary | ICD-10-CM | POA: Diagnosis not present

## 2022-11-24 DIAGNOSIS — R2689 Other abnormalities of gait and mobility: Secondary | ICD-10-CM | POA: Diagnosis not present

## 2022-11-24 DIAGNOSIS — M6281 Muscle weakness (generalized): Secondary | ICD-10-CM | POA: Diagnosis not present

## 2022-11-25 ENCOUNTER — Ambulatory Visit: Payer: Medicare HMO | Attending: Cardiovascular Disease | Admitting: *Deleted

## 2022-11-25 DIAGNOSIS — I4892 Unspecified atrial flutter: Secondary | ICD-10-CM | POA: Diagnosis not present

## 2022-11-25 DIAGNOSIS — Z5181 Encounter for therapeutic drug level monitoring: Secondary | ICD-10-CM | POA: Diagnosis not present

## 2022-11-25 LAB — POCT INR: INR: 2.1 (ref 2.0–3.0)

## 2022-11-25 NOTE — Patient Instructions (Signed)
Description   Continue taking Warfarin 1.5 tablets daily except 1 tablet on Sundays and Thursdays. Keep leafy veggies consistent. Recheck INR in 4 weeks. Coumadin Clinic [phone].

## 2022-12-02 DIAGNOSIS — R053 Chronic cough: Secondary | ICD-10-CM | POA: Diagnosis not present

## 2022-12-02 DIAGNOSIS — M25571 Pain in right ankle and joints of right foot: Secondary | ICD-10-CM | POA: Diagnosis not present

## 2022-12-04 ENCOUNTER — Encounter (HOSPITAL_BASED_OUTPATIENT_CLINIC_OR_DEPARTMENT_OTHER): Payer: Self-pay | Admitting: Family

## 2022-12-04 ENCOUNTER — Ambulatory Visit (HOSPITAL_BASED_OUTPATIENT_CLINIC_OR_DEPARTMENT_OTHER): Payer: Medicare HMO | Admitting: Family

## 2022-12-04 VITALS — BP 134/70 | HR 69 | Ht 68.0 in | Wt 298.0 lb

## 2022-12-04 DIAGNOSIS — D6859 Other primary thrombophilia: Secondary | ICD-10-CM | POA: Diagnosis not present

## 2022-12-04 DIAGNOSIS — I4819 Other persistent atrial fibrillation: Secondary | ICD-10-CM | POA: Diagnosis not present

## 2022-12-04 DIAGNOSIS — I5032 Chronic diastolic (congestive) heart failure: Secondary | ICD-10-CM

## 2022-12-04 DIAGNOSIS — R0609 Other forms of dyspnea: Secondary | ICD-10-CM | POA: Diagnosis not present

## 2022-12-04 DIAGNOSIS — G4733 Obstructive sleep apnea (adult) (pediatric): Secondary | ICD-10-CM

## 2022-12-04 NOTE — Progress Notes (Signed)
Office Visit    Patient Name: Linda Washington Date of Encounter: 12/04/2022  PCP:  Marda Stalker, [place]  Cardiologist:  Candee Furbish, MD  Advanced Practice Provider:  No care team member to display Electrophysiologist:  None      Chief Complaint    Linda Washington is a 72 y.o. female presents today for dyspnea   Past Medical History    Past Medical History:  Diagnosis Date   Acute on chronic diastolic (congestive) heart failure ([place]) 06/2018   Anxiety    takes Citalopram daily   Arthritis    generalized   Basal cell carcinoma    GERD (gastroesophageal reflux disease)    takes Omeprazole daily   History of Bell's palsy    left side   History of bronchitis    History of hiatal hernia    History of kidney stones    Hyperlipemia    takes Simvastatin daily   Hypertension    takes Enalapril daily   Hypothyroidism    Incontinence    takes Ditropan daily   Joint swelling    left knee   Neuropathy    takes Gabapentin daily   PONV (postoperative nausea and vomiting)    RLS (restless legs syndrome)    Seasonal allergies    takes Claritin daioy as needed   Sleep apnea    pt. states that she does not have a CPAP   Tremor    Type 2 diabetes mellitus ([place])    takes Metformin and Amaryl daily   Past Surgical History:  Procedure Laterality Date   ACHILLES TENDON REPAIR Right    APPENDECTOMY     BASAL CELL CARCINOMA EXCISION     Nose   BREAST LUMPECTOMY Left    CARDIAC CATHETERIZATION     pt. states approximately 15 years ago   CARDIOVERSION N/A 06/22/2018   Procedure: CARDIOVERSION;  Surgeon: Buford Dresser, MD;  Location: [facility] ENDOSCOPY;  Service: Cardiovascular;  Laterality: N/A;   CARPAL TUNNEL RELEASE Bilateral    DILATION AND CURETTAGE OF UTERUS     EYE SURGERY Bilateral    cataract surgery   KNEE ARTHROSCOPY     Right   LEFT HEART CATH AND CORONARY ANGIOGRAPHY N/A 02/01/2017   Procedure: Left Heart Cath  and Coronary Angiography;  Surgeon: Lorretta Harp, MD;  Location: [place] [facility];  Service: Cardiovascular;  Laterality: N/A;   LITHOTRIPSY  2013   TEE WITHOUT CARDIOVERSION N/A 06/22/2018   Procedure: TRANSESOPHAGEAL ECHOCARDIOGRAM (TEE);  Surgeon: Buford Dresser, MD;  Location: [facility] ENDOSCOPY;  Service: Cardiovascular;  Laterality: N/A;   TOTAL ABDOMINAL HYSTERECTOMY     TOTAL KNEE ARTHROPLASTY Left 04/08/2015   Procedure: LEFT TOTAL KNEE ARTHROPLASTY;  Surgeon: Frederik Pear, MD;  Location: [facility];  Service: Orthopedics;  Laterality: Left;    Allergies  Allergies  Allergen Reactions   Cyclobenzaprine Other (See Comments)   Gabapentin Other (See Comments)   Hydrocodone Other (See Comments)    Weird sensations mentally; "makes me fuzzy"   Methocarbamol Hives   Oxycodone Other (See Comments)    Weird sensations mentally; "makes me fuzzy"   Tinidazole     Other reaction(s): hives   Latex Hives and Rash    History of Present Illness    Linda Washington is a 73 y.o. female with a hx of persistent atrial flutter on chronic anticoagulation, chronic diastolic heart failure OSA, obesity, hyperlipidemia last seen 04/27/22 by Dr.  Skains.  Family history notable for heart failure in both parents.  Previously underwent TEE cardioversion but then quickly reverted back to atrial flutter the next day and has since been maintained with rate control.  Prior cardiac catheterization in 2018 with normal coronary arteries and normal LVEF.  She did have significant fall November 2022 with left rib fractures and hemothorax.  She last saw Dr. Marlou Porch 04/27/2022.  Coumadin was continued (previously on Xarelto). No changes were made at that time.  She presents today independently. Since last seen notes she has had pneumonia, upper respiratory infection, bronchitis,  thrush (which she attributes to inhaler). She notes more shortness of breath with exertion. This has been ongoing for a couple months and  has been worsening. No wheeze, orhtopnea, PND. She is coughing which is on occasion productive with green phlegm. She notes occasional pain under her left armpit which she attributes to her previous fall 08/2021. She does have a CXR upcoming later today with primary.   Notes her heart fluttering over the last week with exertion or at rest. Overall short episodes and not bothersome. Heart rate at home in the 90s. She monitors blood pressure at home and reports routinely less than 130/80. No lightheadedness nor dizziness. Occasional edema in her ankles after long periods of walking. Her exercise tolerance is limited by dyspnea.   EKGs/Labs/Other Studies Reviewed:   The following studies were reviewed today:  EKG:  EKG is ordered today.  The ekg ordered today demonstrates rate controlled atrial fibrillation 69 bpm.   Recent Labs: No results found for requested labs within last 365 days.  Recent Lipid Panel    Component Value Date/Time   CHOL 115 10/31/2019 1653   TRIG 65 10/31/2019 1653   HDL 52 10/31/2019 1653   CHOLHDL 2.2 10/31/2019 1653   LDLCALC 49 10/31/2019 1653   Home Medications   Current Meds  Medication Sig   acetaminophen (TYLENOL) 650 MG CR tablet Take 650 mg by mouth every 8 (eight) hours as needed for pain.   atorvastatin (LIPITOR) 40 MG tablet TAKE 1 TABLET AT BEDTIME   carvedilol (COREG) 3.125 MG tablet TAKE 1 TABLET TWICE DAILY WITH MEALS   citalopram (CELEXA) 20 MG tablet Take 20 mg by mouth daily.   Cyanocobalamin (VITAMIN B 12 PO) Take 1,000 mg by mouth daily.   fluticasone (FLONASE) 50 MCG/ACT nasal spray Place 1-2 sprays into both nostrils daily as needed for allergies or rhinitis.   furosemide (LASIX) 40 MG tablet Take 1 tablet (40 mg total) by mouth daily.   metFORMIN (GLUCOPHAGE) 1000 MG tablet Take 1,000 mg by mouth 2 (two) times daily with a meal.   NOVOLOG FLEXPEN 100 UNIT/ML FlexPen Inject 4 Units into the skin daily as needed. If Blood Sugar>200=Take 4  units   omeprazole (PRILOSEC) 40 MG capsule Take 40 mg by mouth daily.   oxybutynin (DITROPAN XL) 15 MG 24 hr tablet Take 15 mg by mouth daily.   Polyethyl Glycol-Propyl Glycol (SYSTANE OP) Place 1-2 drops into both eyes daily as needed (dry eyes).   warfarin (COUMADIN) 5 MG tablet TAKE AS DIRECTED BY COUMADIN CLINIC   Current Facility-Administered Medications for the 12/04/22 encounter (Office Visit) with Loel Dubonnet, NP  Medication   betamethasone acetate-betamethasone sodium phosphate (CELESTONE) injection 3 mg     Review of Systems      All other systems reviewed and are otherwise negative except as noted above.  Physical Exam    VS:  BP 134/70  Pulse 69   Ht 5' 8"$  (1.727 m)   Wt 298 lb (135.2 kg)   BMI 45.31 kg/m  , BMI Body mass index is 45.31 kg/m.  Wt Readings from Last 3 Encounters:  12/04/22 298 lb (135.2 kg)  04/27/22 288 lb (130.6 kg)  09/03/21 281 lb 15.5 oz (127.9 kg)     GEN: Well nourished, overweight, well developed, in no acute distress. HEENT: normal. Neck: Supple, no JVD, carotid bruits, or masses. Cardiac: IRIR, no murmurs, rubs, or gallops. No clubbing, cyanosis, edema.  Radials/PT 2+ and equal bilaterally.  Respiratory:  Respirations regular and unlabored, clear to auscultation bilaterally. GI: Soft, nontender, nondistended. MS: No deformity or atrophy. Skin: Warm and dry, no rash. Neuro:  Strength and sensation are intact. Psych: Normal affect.  Assessment & Plan    Persistent atrial flutter/hypercoagulable state- rate controlled atrial fibrillation today. CHA2DS2-VASc Score = 5 [CHF History: 1, HTN History: 1, Diabetes History: 1, Stroke History: 0, Vascular Disease History: 0, Age Score: 1, Gender Score: 1].  Therefore, the patient's annual risk of stroke is 7.2 %.    Continue Warfarin. Update CBC to rule out anemia as contributory to dyspnea.   Chronic diastolic heart failure- Volume status difficult to determine with body habitus. BNP  today. Notes taking Lasix 29m QD with additional tablet PRN once per week. Given worsening dyspnea, plan for echocardiogram.   Hyperlipidemia- 08/2022 LDL 45. Continue Atorvastatin 469mQD.   OSA- CPAP compliance encouraged.   Obesity / BMI 45 - Weight loss via diet and exercise encouraged. Discussed the impact being overweight would have on cardiovascular risk. [facility] PREP [facility]         Disposition: Follow up in 4-6 week(s) with MaCandee FurbishMD or APP.  Signed, CaLoel DubonnetNP 12/04/2022, 10:29 AM [place]

## 2022-12-04 NOTE — Patient Instructions (Signed)
Medication Instructions:  Continue your current medications.   *If you need a refill on your cardiac medications before your next appointment, please call your pharmacy*   Lab Work: Your physician recommends that you return for lab work today: CBC, BNP  If you have labs (blood work) drawn today and your tests are completely normal, you will receive your results only by: MyChart Message (if you have MyChart) OR A paper copy in the mail If you have any lab test that is abnormal or we need to change your treatment, we will call you to review the results.   Testing/Procedures: Your physician has requested that you have an echocardiogram at our [facility] office. Echocardiography is a painless test that uses sound waves to create images of your heart. It provides your doctor with information about the size and shape of your heart and how well your heart's chambers and valves are working. This procedure takes approximately one hour. There are no restrictions for this procedure. Please do NOT wear cologne, perfume, aftershave, or lotions (deodorant is allowed). Please arrive 15 minutes prior to your appointment time.    Follow-Up: At [facility], you and your health needs are our priority.  As part of our continuing mission to provide you with exceptional heart care, we have created designated Provider Care Teams.  These Care Teams include your primary Cardiologist (physician) and Advanced Practice Providers (APPs -  Physician Assistants and Nurse Practitioners) who all work together to provide you with the care you need, when you need it.  We recommend signing up for the patient portal called "MyChart".  Sign up information is provided on this After Visit Summary.  MyChart is used to connect with patients for Virtual Visits (Telemedicine).  Patients are able to view lab/test results, encounter notes, upcoming appointments, etc.  Non-urgent messages can be sent to your provider as well.    To learn more about what you can do with MyChart, go to [email or web address].    Your next appointment:   4-6 week(s)  Provider:   Candee Furbish, MD or Laurann Montana, NP or Advanced Practice Provider at [facility]   Other Instructions  Heart Healthy Diet Recommendations: A low-salt diet is recommended. Meats should be grilled, baked, or boiled. Avoid fried foods. Focus on lean protein sources like fish or chicken with vegetables and fruits. The American Heart Association is a Microbiologist!  American Heart Association Diet and Lifeystyle Recommendations   Exercise recommendations: The American Heart Association recommends 150 minutes of moderate intensity exercise weekly. Try 30 minutes of moderate intensity exercise 4-5 times per week. This could include walking, jogging, or swimming.

## 2022-12-05 LAB — CBC
Hematocrit: 38.5 % (ref 34.0–46.6)
Hemoglobin: 12.5 g/dL (ref 11.1–15.9)
MCH: 28.9 pg (ref 26.6–33.0)
MCHC: 32.5 g/dL (ref 31.5–35.7)
MCV: 89 fL (ref 79–97)
Platelets: 336 10*3/uL (ref 150–450)
RBC: 4.33 x10E6/uL (ref 3.77–5.28)
RDW: 13.7 % (ref 11.7–15.4)
WBC: 8.7 10*3/uL (ref 3.4–10.8)

## 2022-12-05 LAB — BRAIN NATRIURETIC PEPTIDE: BNP: 61.2 pg/mL (ref 0.0–100.0)

## 2022-12-07 ENCOUNTER — Telehealth: Payer: Self-pay

## 2022-12-07 NOTE — Telephone Encounter (Signed)
Call from pt reference PREP referral Returned call to pt. Does want to start PREP . Can do [facility].  Offered 12/28/22 MW 1p-215p Will contact later to set up intake appt.

## 2022-12-08 ENCOUNTER — Other Ambulatory Visit: Payer: Self-pay | Admitting: Family Medicine

## 2022-12-08 ENCOUNTER — Ambulatory Visit
Admission: RE | Admit: 2022-12-08 | Discharge: 2022-12-08 | Disposition: A | Discharge status: Home / Self Care | Payer: Medicare HMO | Source: Ambulatory Visit | Attending: Family Medicine | Admitting: Family Medicine

## 2022-12-08 DIAGNOSIS — R059 Cough, unspecified: Secondary | ICD-10-CM | POA: Diagnosis not present

## 2022-12-08 DIAGNOSIS — R0602 Shortness of breath: Secondary | ICD-10-CM

## 2022-12-14 ENCOUNTER — Encounter: Payer: Self-pay | Admitting: Pulmonary Disease

## 2022-12-14 ENCOUNTER — Ambulatory Visit: Payer: Medicare HMO | Admitting: Pulmonary Disease

## 2022-12-14 VITALS — BP 124/66 | HR 61 | Temp 98.0°F | Ht 66.0 in | Wt 299.2 lb

## 2022-12-14 DIAGNOSIS — G4733 Obstructive sleep apnea (adult) (pediatric): Secondary | ICD-10-CM

## 2022-12-14 DIAGNOSIS — R059 Cough, unspecified: Secondary | ICD-10-CM | POA: Diagnosis not present

## 2022-12-14 LAB — CBC WITH DIFFERENTIAL/PLATELET
Basophils Absolute: 0.1 10*3/uL (ref 0.0–0.1)
Basophils Relative: 0.8 % (ref 0.0–3.0)
Eosinophils Absolute: 0.3 10*3/uL (ref 0.0–0.7)
Eosinophils Relative: 2.7 % (ref 0.0–5.0)
HCT: 36 % (ref 36.0–46.0)
Hemoglobin: 11.9 g/dL — ABNORMAL LOW (ref 12.0–15.0)
Lymphocytes Relative: 23.7 % (ref 12.0–46.0)
Lymphs Abs: 2.2 10*3/uL (ref 0.7–4.0)
MCHC: 33.1 g/dL (ref 30.0–36.0)
MCV: 88 fl (ref 78.0–100.0)
Monocytes Absolute: 0.7 10*3/uL (ref 0.1–1.0)
Monocytes Relative: 7.7 % (ref 3.0–12.0)
Neutro Abs: 6 10*3/uL (ref 1.4–7.7)
Neutrophils Relative %: 65.1 % (ref 43.0–77.0)
Platelets: 349 10*3/uL (ref 150.0–400.0)
RBC: 4.1 Mil/uL (ref 3.87–5.11)
RDW: 15.7 % — ABNORMAL HIGH (ref 11.5–15.5)
WBC: 9.2 10*3/uL (ref 4.0–10.5)

## 2022-12-14 MED ORDER — BUDESONIDE-FORMOTEROL FUMARATE 160-4.5 MCG/ACT IN AERO: 2.0000 | INHALATION_SPRAY | Freq: Two times a day (BID) | RESPIRATORY_TRACT | 11 refills | Status: DC

## 2022-12-14 NOTE — Progress Notes (Signed)
Linda Washington    [id number]    [birth year]  Primary Care Physician:[place], Myrtha Mantis  Referring Physician: Marda Stalker, [place],  [place] [address]  Chief complaint: Consult for chronic cough  HPI: 72 y.o. who  has a past medical history of Acute on chronic diastolic (congestive) heart failure ([place]) (06/2018), Anxiety, Arthritis, Basal cell carcinoma, GERD (gastroesophageal reflux disease), History of Bell's palsy, History of bronchitis, History of hiatal hernia, History of kidney stones, Hyperlipemia, Hypertension, Hypothyroidism, Incontinence, Joint swelling, Neuropathy, PONV (postoperative nausea and vomiting), RLS (restless legs syndrome), Seasonal allergies, Sleep apnea, Tremor, and Type 2 diabetes mellitus ([place]).   Here for evaluation of cough with occasional green mucus, dyspnea.  Denies any wheeze, fevers, chills.  Symptoms started after she was diagnosed and treated with pneumonia as an outpatient with doxycycline in October 2023.  Has history of seasonal allergies, OSA noncompliant with CPAP.  History notable for a fall while on Coumadin in 2022 with left rib fractures and hemothorax which was treated conservatively.   Pets: Cats Occupation: Retired Chief Strategy Officer Exposures: No mold, hot tub, Jacuzzi.  No feather pillows or comforters Smoking history: Never smoker Travel history: Previously lived in [place] and [place] Relevant family history: No significant family history of lung disease   Outpatient Encounter Medications as of 12/14/2022  Medication Sig   acetaminophen (TYLENOL) 650 MG CR tablet Take 650 mg by mouth every 8 (eight) hours as needed for pain.   atorvastatin (LIPITOR) 40 MG tablet TAKE 1 TABLET AT BEDTIME   carvedilol (COREG) 3.125 MG tablet TAKE 1 TABLET TWICE DAILY WITH MEALS   citalopram (CELEXA) 20 MG tablet Take 20 mg by mouth daily.   Cyanocobalamin (VITAMIN B 12 PO) Take 1,000 mg by mouth daily.    fluticasone (FLONASE) 50 MCG/ACT nasal spray Place 1-2 sprays into both nostrils daily as needed for allergies or rhinitis.   furosemide (LASIX) 40 MG tablet Take 1 tablet (40 mg total) by mouth daily.   metFORMIN (GLUCOPHAGE) 1000 MG tablet Take 1,000 mg by mouth 2 (two) times daily with a meal.   NOVOLOG FLEXPEN 100 UNIT/ML FlexPen Inject 4 Units into the skin daily as needed. If Blood Sugar>200=Take 4 units   omeprazole (PRILOSEC) 40 MG capsule Take 40 mg by mouth daily.   oxybutynin (DITROPAN XL) 15 MG 24 hr tablet Take 15 mg by mouth daily.   Polyethyl Glycol-Propyl Glycol (SYSTANE OP) Place 1-2 drops into both eyes daily as needed (dry eyes).   TRULICITY [id number] [id number] SOPN Inject 0.75 mg into the skin once a week. Take on Wed   warfarin (COUMADIN) 5 MG tablet TAKE AS DIRECTED BY COUMADIN CLINIC   Facility-Administered Encounter Medications as of 12/14/2022  Medication   betamethasone acetate-betamethasone sodium phosphate (CELESTONE) injection 3 mg    Allergies as of 12/14/2022 - Review Complete 12/14/2022  Allergen Reaction Noted   Cyclobenzaprine Other (See Comments) 11/28/2020   Gabapentin Other (See Comments) 11/28/2020   Hydrocodone Other (See Comments) 03/26/2015   Methocarbamol Hives 04/28/2015   Oxycodone Other (See Comments) 03/26/2015   Tinidazole  11/28/2020   Latex Hives and Rash 01/16/2015    Past Medical History:  Diagnosis Date   Acute on chronic diastolic (congestive) heart failure ([place]) 06/2018   Anxiety    takes Citalopram daily   Arthritis    generalized   Basal cell carcinoma    GERD (gastroesophageal reflux disease)    takes  Omeprazole daily   History of Bell's palsy    left side   History of bronchitis    History of hiatal hernia    History of kidney stones    Hyperlipemia    takes Simvastatin daily   Hypertension    takes Enalapril daily   Hypothyroidism    Incontinence    takes Ditropan daily   Joint swelling    left knee   Neuropathy     takes Gabapentin daily   PONV (postoperative nausea and vomiting)    RLS (restless legs syndrome)    Seasonal allergies    takes Claritin daioy as needed   Sleep apnea    pt. states that she does not have a CPAP   Tremor    Type 2 diabetes mellitus ([place])    takes Metformin and Amaryl daily    Past Surgical History:  Procedure Laterality Date   ACHILLES TENDON REPAIR Right    APPENDECTOMY     BASAL CELL CARCINOMA EXCISION     Nose   BREAST LUMPECTOMY Left    CARDIAC CATHETERIZATION     pt. states approximately 15 years ago   CARDIOVERSION N/A 06/22/2018   Procedure: CARDIOVERSION;  Surgeon: Buford Dresser, MD;  Location: [facility] ENDOSCOPY;  Service: Cardiovascular;  Laterality: N/A;   CARPAL TUNNEL RELEASE Bilateral    DILATION AND CURETTAGE OF UTERUS     EYE SURGERY Bilateral    cataract surgery   KNEE ARTHROSCOPY     Right   LEFT HEART CATH AND CORONARY ANGIOGRAPHY N/A 02/01/2017   Procedure: Left Heart Cath and Coronary Angiography;  Surgeon: Lorretta Harp, MD;  Location: [place] [facility];  Service: Cardiovascular;  Laterality: N/A;   LITHOTRIPSY  2013   TEE WITHOUT CARDIOVERSION N/A 06/22/2018   Procedure: TRANSESOPHAGEAL ECHOCARDIOGRAM (TEE);  Surgeon: Buford Dresser, MD;  Location: [facility] ENDOSCOPY;  Service: Cardiovascular;  Laterality: N/A;   TOTAL ABDOMINAL HYSTERECTOMY     TOTAL KNEE ARTHROPLASTY Left 04/08/2015   Procedure: LEFT TOTAL KNEE ARTHROPLASTY;  Surgeon: Frederik Pear, MD;  Location: [place];  Service: Orthopedics;  Laterality: Left;    Family History  Problem Relation Age of Onset   Heart disease Mother    Heart failure Mother 79   Heart disease Father    Heart failure Father    Heart disease Brother 78   CAD Brother 21    Social History   Socioeconomic History   Marital status: Divorced    Spouse name: Not on file   Number of children: Not on file   Years of education: Some colle   Highest education level: Not on file  Occupational  History   Occupation: part-time Research scientist (physical sciences) at [employer]  Tobacco Use   Smoking status: Never    Passive exposure: Never   Smokeless tobacco: Never  Vaping Use   Vaping Use: Never used  Substance and Sexual Activity   Alcohol use: Yes    Alcohol/week: 0.0 standard drinks of alcohol    Comment: Rare-wine   Drug use: No   Sexual activity: Not on file  Other Topics Concern   Not on file  Social History Narrative   Lives at home with Lonn Georgia, roommate   Caffeine use: 1 Coffee day, 1-2 a day of tea, 1-2 sodas a day    Social Determinants of Health   Financial Resource Strain: Not on file  Food Insecurity: Not on file  Transportation Needs: Not on file  Physical Activity: Not on  file  Stress: Not on file  Social Connections: Not on file  Intimate Partner Violence: Not on file    Review of systems: Review of Systems  Constitutional: Negative for fever and chills.  HENT: Negative.   Eyes: Negative for blurred vision.  Respiratory: as per HPI  Cardiovascular: Negative for chest pain and palpitations.  Gastrointestinal: Negative for vomiting, diarrhea, blood per rectum. Genitourinary: Negative for dysuria, urgency, frequency and hematuria.  Musculoskeletal: Negative for myalgias, back pain and joint pain.  Skin: Negative for itching and rash.  Neurological: Negative for dizziness, tremors, focal weakness, seizures and loss of consciousness.  Endo/Heme/Allergies: Negative for environmental allergies.  Psychiatric/Behavioral: Negative for depression, suicidal ideas and hallucinations.  All other systems reviewed and are negative.  Physical Exam: Blood pressure 124/66, pulse 61, temperature 98 F (36.7 C), temperature source Oral, height '5\' 6"'$  (1.676 m), weight 299 lb 3.2 oz (135.7 kg), SpO2 97 %. Gen:      No acute distress HEENT:  EOMI, sclera anicteric Neck:     No masses; no thyromegaly Lungs:    Clear to auscultation bilaterally; normal respiratory effort CV:          Regular rate and rhythm; no murmurs Abd:      + bowel sounds; soft, non-tender; no palpable masses, no distension Ext:    No edema; adequate peripheral perfusion Skin:      Warm and dry; no rash Neuro: alert and oriented x 3 Psych: normal mood and affect  Data Reviewed: Imaging: CT chest 09/03/2021-left rib fractures, moderate left hemithorax with consolidative changes in the left lower lobe.  Chest x-ray 12/08/2022-no active cardiopulmonary disease. I have reviewed the images personally.  PFTs:  Labs:  Assessment:  Chronic bronchitis.   She is a non-smoker and is low suspicion for emphysema.  She may have reactive airway disease/asthma given history of seasonal allergies Will check CBC differential, IgE Start Symbicort 160 with spacer.  She does note for when she had used albuterol in the past and reminded her to rinse mouth after each use Give Z-Pak today as she has cough with purulent sputum. Schedule PFTs and follow-up in 3 months  Plan/Recommendations: Symbicort with spacer CBC with differential, IgE PFTs  Marshell Garfinkel MD [facility] 12/14/2022, 8:44 AM  CC: Marda Stalker, PA-C

## 2022-12-14 NOTE — Patient Instructions (Signed)
Will start you on an inhaler called Symbicort.  Take 2 puffs twice daily with spacer Will get CBC with differential, IgE Schedule PFTs in 3 months  Follow-up in 3 months

## 2022-12-15 ENCOUNTER — Telehealth: Payer: Self-pay | Admitting: Pulmonary Disease

## 2022-12-15 DIAGNOSIS — K219 Gastro-esophageal reflux disease without esophagitis: Secondary | ICD-10-CM | POA: Diagnosis not present

## 2022-12-15 LAB — IGE: IgE (Immunoglobulin E), Serum: 66 kU/L (ref <=114)

## 2022-12-15 NOTE — Telephone Encounter (Signed)
I called and spoke with Linda Washington she verbalized that Dr.Mannam wanted he to be prescribed a z-pak.   Dr.Mannam would you like me to send this in still?

## 2022-12-15 NOTE — Telephone Encounter (Signed)
Patient states Zpak not at pharmacy. Pharmacy is [facility]. Patient phone number is [phone].

## 2022-12-16 MED ORDER — AZITHROMYCIN 250 MG PO TABS: ORAL_TABLET | ORAL | 0 refills | Status: DC

## 2022-12-16 NOTE — Telephone Encounter (Signed)
I apologize that the Z-Pak was not sent.  Can you please send a prescription to her pharmacy.

## 2022-12-16 NOTE — Telephone Encounter (Signed)
Called and spoke with patient.  Z Pak prescription sent to requested [facility] pharmacy.  Nothing further at this time.

## 2022-12-17 DIAGNOSIS — E1142 Type 2 diabetes mellitus with diabetic polyneuropathy: Secondary | ICD-10-CM | POA: Diagnosis not present

## 2022-12-18 ENCOUNTER — Telehealth: Payer: Self-pay

## 2022-12-18 NOTE — Telephone Encounter (Signed)
Received call back from pt reference starting PREP. Can start on 12/28/22 1p-215p Intake scheduled for 12/24/22 at 230p.  Will meet her at front desk at [facility]

## 2022-12-18 NOTE — Telephone Encounter (Signed)
LVMT pt requesting call back to discuss starting PREP on 12/28/22 instead of 12/21/22.

## 2022-12-23 ENCOUNTER — Ambulatory Visit: Payer: Medicare HMO | Attending: Internal Medicine | Admitting: Pharmacist Clinician (PhC)/ Clinical Pharmacy Specialist

## 2022-12-23 DIAGNOSIS — Z5181 Encounter for therapeutic drug level monitoring: Secondary | ICD-10-CM | POA: Diagnosis not present

## 2022-12-23 DIAGNOSIS — I4892 Unspecified atrial flutter: Secondary | ICD-10-CM

## 2022-12-23 LAB — POCT INR: INR: 2 (ref 2.0–3.0)

## 2022-12-24 ENCOUNTER — Encounter: Payer: Self-pay | Admitting: Pulmonary Disease

## 2022-12-24 ENCOUNTER — Telehealth: Payer: Self-pay

## 2022-12-24 ENCOUNTER — Ambulatory Visit: Payer: Medicare HMO | Admitting: Pulmonary Disease

## 2022-12-24 VITALS — BP 124/72 | HR 66 | Temp 97.8°F | Ht 66.0 in | Wt 300.8 lb

## 2022-12-24 DIAGNOSIS — R059 Cough, unspecified: Secondary | ICD-10-CM | POA: Diagnosis not present

## 2022-12-24 DIAGNOSIS — R0602 Shortness of breath: Secondary | ICD-10-CM | POA: Diagnosis not present

## 2022-12-24 MED ORDER — PREDNISONE 20 MG PO TABS: ORAL_TABLET | ORAL | 0 refills | Status: DC

## 2022-12-24 NOTE — Telephone Encounter (Signed)
Received message from pt this am explaining she was having some difficulty breathing and would not make appt today to start PREP.  Returned call and left message just to keep me posted and let me know what the MD says.  We can start her at a later date when she is better.

## 2022-12-24 NOTE — Patient Instructions (Signed)
Sorry that you are not feeling well with your breathing We will start you on prednisone 40 mg a day for 5 days to see if that improves your breathing I do not believe you will require additional antibiotics Will order high-res CT and PFTs Follow-up in 3 months

## 2022-12-24 NOTE — Progress Notes (Signed)
Linda Washington    WB:[id number]    [birth year]  Primary Care Physician:Wharton, Myrtha Mantis  Referring Physician: Marda Stalker, [address] [place],  [place] [address]  Chief complaint: Follow-up for chronic cough  HPI: 72 y.o. who  has a past medical history of Acute on chronic diastolic (congestive) heart failure ([place]) (06/2018), Anxiety, Arthritis, Basal cell carcinoma, GERD (gastroesophageal reflux disease), History of Bell's palsy, History of bronchitis, History of hiatal hernia, History of kidney stones, Hyperlipemia, Hypertension, Hypothyroidism, Incontinence, Joint swelling, Neuropathy, PONV (postoperative nausea and vomiting), RLS (restless legs syndrome), Seasonal allergies, Sleep apnea, Tremor, and Type 2 diabetes mellitus ([place]).   Here for evaluation of cough with occasional green mucus, dyspnea.  Denies any wheeze, fevers, chills.  Symptoms started after she was diagnosed and treated with pneumonia as an outpatient with doxycycline in October 2023.  Has history of seasonal allergies, OSA noncompliant with CPAP.  History notable for a fall while on Coumadin in 2022 with left rib fractures and hemothorax which was treated conservatively.   Pets: Cats Occupation: Retired Chief Strategy Officer Exposures: No mold, hot tub, Jacuzzi.  No feather pillows or comforters Smoking history: Never smoker Travel history: Previously lived in [place] and [place] Relevant family history: No significant family history of lung disease  Interim history: At last visit he was given Z-Pak with some improvement in chest congestion Also started on Symbicort which has not made any difference in breathing  Notes increase dyspnea on exertion over the past few weeks.  No wheezing She is also dealing with atrial fibrillation and follows up with cardiology.  Echocardiogram is pending  Outpatient Encounter Medications as of 12/24/2022  Medication Sig   acetaminophen (TYLENOL) 650 MG  CR tablet Take 650 mg by mouth every 8 (eight) hours as needed for pain.   atorvastatin (LIPITOR) 40 MG tablet TAKE 1 TABLET AT BEDTIME   budesonide-formoterol (SYMBICORT) 160-4.5 MCG/ACT inhaler Inhale 2 puffs into the lungs in the morning and at bedtime.   carvedilol (COREG) 3.125 MG tablet TAKE 1 TABLET TWICE DAILY WITH MEALS   citalopram (CELEXA) 20 MG tablet Take 20 mg by mouth daily.   Cyanocobalamin (VITAMIN B 12 PO) Take 1,000 mg by mouth daily.   fluticasone (FLONASE) 50 MCG/ACT nasal spray Place 1-2 sprays into both nostrils daily as needed for allergies or rhinitis.   furosemide (LASIX) 40 MG tablet Take 1 tablet (40 mg total) by mouth daily.   metFORMIN (GLUCOPHAGE) 1000 MG tablet Take 1,000 mg by mouth 2 (two) times daily with a meal.   NOVOLOG FLEXPEN 100 UNIT/ML FlexPen Inject 4 Units into the skin daily as needed. If Blood Sugar>200=Take 4 units   omeprazole (PRILOSEC) 40 MG capsule Take 40 mg by mouth daily.   oxybutynin (DITROPAN XL) 15 MG 24 hr tablet Take 15 mg by mouth daily.   Polyethyl Glycol-Propyl Glycol (SYSTANE OP) Place 1-2 drops into both eyes daily as needed (dry eyes).   TRULICITY [id number] SOPN Inject 0.75 mg into the skin once a week. Take on Wed   warfarin (COUMADIN) 5 MG tablet TAKE AS DIRECTED BY COUMADIN CLINIC   [DISCONTINUED] azithromycin (ZITHROMAX) 250 MG tablet Take as directed   Facility-Administered Encounter Medications as of 12/24/2022  Medication   betamethasone acetate-betamethasone sodium phosphate (CELESTONE) injection 3 mg   Physical Exam: Blood pressure 124/66, pulse 61, temperature 98 F (36.7 C), temperature source Oral, height '5\' 6"'$  (1.676 m), weight 299 lb  3.2 oz (135.7 kg), SpO2 97 %. Gen:      No acute distress HEENT:  EOMI, sclera anicteric Neck:     No masses; no thyromegaly Lungs:    Clear to auscultation bilaterally; normal respiratory effort CV:         Regular rate and rhythm; no murmurs Abd:      + bowel sounds; soft,  non-tender; no palpable masses, no distension Ext:    No edema; adequate peripheral perfusion Skin:      Warm and dry; no rash Neuro: alert and oriented x 3 Psych: normal mood and affect  Data Reviewed: Imaging: CT chest 09/03/2021-left rib fractures, moderate left hemithorax with consolidative changes in the left lower lobe.  Chest x-ray 12/08/2022-no active cardiopulmonary disease. I have reviewed the images personally.  PFTs:  Labs: CBC differential 12/14/2022- WBC 9.2 eos 2.7%, absolute eosinophil count 248 IgE 12/14/2022- 66  Assessment:  Chronic bronchitis.   She is a non-smoker and is low suspicion for emphysema.  She may have reactive airway disease/asthma given history of seasonal allergies Started on Symbicort but still continues to have dyspnea on exertion of unclear etiology Suspect her atrial fibrillation may be playing a big role in symptoms of dyspnea.  Echocardiogram is pending from cardiology  Will give her prednisone 40 mg a day for 5 days to see if it improves her breathing.  She knows to increase the insulin dose in case her blood sugar is high.  I do not believe she will require additional antibiotics as she already got a Z-Pak recently Will also get high-res CT and PFTs for evaluation of lung function  Plan/Recommendations: Symbicort with spacer Prednisone 40 mg a day for 5 days High-res CT, PFTs  Marshell Garfinkel MD [facility] 12/24/2022, 3:09 PM  CC: Marda Stalker, PA-C

## 2022-12-25 ENCOUNTER — Ambulatory Visit
Admission: RE | Admit: 2022-12-25 | Discharge: 2022-12-25 | Disposition: A | Discharge status: Home / Self Care | Payer: Medicare HMO | Source: Ambulatory Visit | Attending: Family Medicine | Admitting: Family Medicine

## 2022-12-25 DIAGNOSIS — Z1231 Encounter for screening mammogram for malignant neoplasm of breast: Secondary | ICD-10-CM | POA: Diagnosis not present

## 2022-12-26 ENCOUNTER — Telehealth: Payer: Self-pay

## 2022-12-26 NOTE — Telephone Encounter (Signed)
Call to pt reference PREP class. Request call back to schedule intake and confirm attending.

## 2022-12-26 NOTE — Telephone Encounter (Signed)
Returned call-pt still having SOB and will have f/u with testing and pulm so will not be able to do 01/05/23 either. Will plan on calling in mid May to see if she is ready. Agreeable to plan

## 2022-12-29 DIAGNOSIS — L814 Other melanin hyperpigmentation: Secondary | ICD-10-CM | POA: Diagnosis not present

## 2022-12-29 DIAGNOSIS — B078 Other viral warts: Secondary | ICD-10-CM | POA: Diagnosis not present

## 2022-12-29 DIAGNOSIS — D1801 Hemangioma of skin and subcutaneous tissue: Secondary | ICD-10-CM | POA: Diagnosis not present

## 2022-12-29 DIAGNOSIS — L821 Other seborrheic keratosis: Secondary | ICD-10-CM | POA: Diagnosis not present

## 2022-12-29 DIAGNOSIS — D1721 Benign lipomatous neoplasm of skin and subcutaneous tissue of right arm: Secondary | ICD-10-CM | POA: Diagnosis not present

## 2022-12-29 DIAGNOSIS — L820 Inflamed seborrheic keratosis: Secondary | ICD-10-CM | POA: Diagnosis not present

## 2022-12-29 DIAGNOSIS — D692 Other nonthrombocytopenic purpura: Secondary | ICD-10-CM | POA: Diagnosis not present

## 2022-12-29 DIAGNOSIS — L720 Epidermal cyst: Secondary | ICD-10-CM | POA: Diagnosis not present

## 2022-12-29 DIAGNOSIS — Z85828 Personal history of other malignant neoplasm of skin: Secondary | ICD-10-CM | POA: Diagnosis not present

## 2023-01-01 ENCOUNTER — Ambulatory Visit (HOSPITAL_COMMUNITY): Payer: Medicare HMO | Attending: Internal Medicine

## 2023-01-01 DIAGNOSIS — R0609 Other forms of dyspnea: Secondary | ICD-10-CM | POA: Insufficient documentation

## 2023-01-01 DIAGNOSIS — I5032 Chronic diastolic (congestive) heart failure: Secondary | ICD-10-CM | POA: Diagnosis not present

## 2023-01-01 LAB — ECHOCARDIOGRAM COMPLETE
AV Vena cont: 0.2 cm
Area-P 1/2: 3.48 cm2
Calc EF: 57.6 %
P 1/2 time: 568 msec
S' Lateral: 3.5 cm
Single Plane A2C EF: 54.4 %
Single Plane A4C EF: 57.9 %

## 2023-01-05 ENCOUNTER — Encounter (HOSPITAL_BASED_OUTPATIENT_CLINIC_OR_DEPARTMENT_OTHER): Payer: Self-pay | Admitting: Family

## 2023-01-05 ENCOUNTER — Telehealth: Payer: Self-pay

## 2023-01-05 ENCOUNTER — Other Ambulatory Visit (HOSPITAL_COMMUNITY): Payer: Self-pay

## 2023-01-05 ENCOUNTER — Ambulatory Visit (HOSPITAL_BASED_OUTPATIENT_CLINIC_OR_DEPARTMENT_OTHER): Payer: Medicare HMO | Admitting: Family

## 2023-01-05 VITALS — BP 143/82 | HR 63 | Ht 66.0 in | Wt 301.0 lb

## 2023-01-05 DIAGNOSIS — I4819 Other persistent atrial fibrillation: Secondary | ICD-10-CM

## 2023-01-05 DIAGNOSIS — I5032 Chronic diastolic (congestive) heart failure: Secondary | ICD-10-CM

## 2023-01-05 DIAGNOSIS — G4733 Obstructive sleep apnea (adult) (pediatric): Secondary | ICD-10-CM

## 2023-01-05 DIAGNOSIS — D6859 Other primary thrombophilia: Secondary | ICD-10-CM

## 2023-01-05 DIAGNOSIS — J42 Unspecified chronic bronchitis: Secondary | ICD-10-CM

## 2023-01-05 DIAGNOSIS — R0609 Other forms of dyspnea: Secondary | ICD-10-CM | POA: Diagnosis not present

## 2023-01-05 DIAGNOSIS — I10 Essential (primary) hypertension: Secondary | ICD-10-CM

## 2023-01-05 DIAGNOSIS — Z6841 Body Mass Index (BMI) 40.0 and over, adult: Secondary | ICD-10-CM | POA: Diagnosis not present

## 2023-01-05 MED ORDER — EMPAGLIFLOZIN 10 MG PO TABS: 10.0000 mg | ORAL_TABLET | Freq: Every day | ORAL | 0 refills | Status: DC

## 2023-01-05 MED ORDER — EMPAGLIFLOZIN 10 MG PO TABS: 10.0000 mg | ORAL_TABLET | Freq: Every day | ORAL | 3 refills | Status: DC

## 2023-01-05 NOTE — Progress Notes (Signed)
Office Visit    Patient Name: Linda Washington Date of Encounter: 01/05/2023  PCP:  Marda Stalker, [place]  Cardiologist:  Candee Furbish, MD  Advanced Practice Provider:  No care team member to display Electrophysiologist:  None      Chief Complaint    Linda Washington is a 72 y.o. female presents today for follow up after echocardiogram   Past Medical History    Past Medical History:  Diagnosis Date   Acute on chronic diastolic (congestive) heart failure ([place]) 06/2018   Anxiety    takes Citalopram daily   Arthritis    generalized   Basal cell carcinoma    GERD (gastroesophageal reflux disease)    takes Omeprazole daily   History of Bell's palsy    left side   History of bronchitis    History of hiatal hernia    History of kidney stones    Hyperlipemia    takes Simvastatin daily   Hypertension    takes Enalapril daily   Hypothyroidism    Incontinence    takes Ditropan daily   Joint swelling    left knee   Neuropathy    takes Gabapentin daily   PONV (postoperative nausea and vomiting)    RLS (restless legs syndrome)    Seasonal allergies    takes Claritin daioy as needed   Sleep apnea    pt. states that she does not have a CPAP   Tremor    Type 2 diabetes mellitus ([place])    takes Metformin and Amaryl daily   Past Surgical History:  Procedure Laterality Date   ACHILLES TENDON REPAIR Right    APPENDECTOMY     BASAL CELL CARCINOMA EXCISION     Nose   BREAST LUMPECTOMY Left    CARDIAC CATHETERIZATION     pt. states approximately 15 years ago   CARDIOVERSION N/A 06/22/2018   Procedure: CARDIOVERSION;  Surgeon: Buford Dresser, MD;  Location: [facility] ENDOSCOPY;  Service: Cardiovascular;  Laterality: N/A;   CARPAL TUNNEL RELEASE Bilateral    DILATION AND CURETTAGE OF UTERUS     EYE SURGERY Bilateral    cataract surgery   KNEE ARTHROSCOPY     Right   LEFT HEART CATH AND CORONARY ANGIOGRAPHY N/A 02/01/2017    Procedure: Left Heart Cath and Coronary Angiography;  Surgeon: Lorretta Harp, MD;  Location: [facility];  Service: Cardiovascular;  Laterality: N/A;   LITHOTRIPSY  2013   TEE WITHOUT CARDIOVERSION N/A 06/22/2018   Procedure: TRANSESOPHAGEAL ECHOCARDIOGRAM (TEE);  Surgeon: Buford Dresser, MD;  Location: [facility] ENDOSCOPY;  Service: Cardiovascular;  Laterality: N/A;   TOTAL ABDOMINAL HYSTERECTOMY     TOTAL KNEE ARTHROPLASTY Left 04/08/2015   Procedure: LEFT TOTAL KNEE ARTHROPLASTY;  Surgeon: Frederik Pear, MD;  Location: [place];  Service: Orthopedics;  Laterality: Left;    Allergies  Allergies  Allergen Reactions   Cyclobenzaprine Other (See Comments)   Gabapentin Other (See Comments)   Hydrocodone Other (See Comments)    Weird sensations mentally; "makes me fuzzy"   Methocarbamol Hives   Oxycodone Other (See Comments)    Weird sensations mentally; "makes me fuzzy"   Tinidazole     Other reaction(s): hives   Latex Hives and Rash    History of Present Illness    [name] is a 72 y.o. female with a hx of persistent atrial flutter on chronic anticoagulation, chronic diastolic heart failure OSA, obesity, hyperlipidemia last seen  12/24/2022.  Family history notable for heart failure in both parents.  Previously underwent TEE cardioversion but then quickly reverted back to atrial flutter the next day and has since been maintained with rate control.  Prior cardiac catheterization in 2018 with normal coronary arteries and normal LVEF.  She did have significant fall November 2022 with left rib fractures and hemothorax.  She saw Dr. Marlou Porch 04/27/2022 doing overall well.  Coumadin was continued (previously on Xarelto). No changes were made at that time.  Last seen 12/04/2022 and since last seen had had pneumonia, upper respiratory infection, bronchitis, thrush.  She noted more exertional dyspnea for a couple of months.  BMP revealed no significant volume overload and anemia was much  improved from previous with hemoglobin of 12.5.  Recommended for updated echocardiogram performed 01/01/2023 with normal LVEF 55 to [id number], normal diastolic parameters, mildly elevated PASP, mild to moderate MR/TR, trivial AI, right atrial pressure 8 mmHg.  She saw Dr. Vaughan Browner of pulmonology 12/24/2022.  She was provided prednisone 40 mg x 5 days.  CT chest ordered due to concern for reactive airway disease/asthma given history of seasonal allergies but not yet performed.  There was also concern her atrial fibrillation may be playing a role in her dyspnea.  She presents today for follow-up. Notes she lost her sister recently and had the funeral yesterday. Does have good family support system.   She feels short of breath when she is walking more which limits her activity. She is motivated to lose weight and frustrated she is unable to lose weight.  Has not been able to get Trulicity from [employer] as had to reapply for PAP. She last took Trulicity about 2 months ago. Follows with Dr. Buddy Duty of endocrinology. Notes her exertional dyspnea has gotten somewhat better but is persistent. Prednisone helped with congestion but not much improvement in breathing.   Average home BP 143 over the last week with 16 readings. This is higher than her previous average.   Last wore CPAP two years ago. She had nasal pillow masks but felt it woke her up. Feels it wakes her up. She returned CPAP machine.   EKGs/Labs/Other Studies Reviewed:   The following studies were reviewed today:  Cardiac Studies & Procedures   CARDIAC CATHETERIZATION  CARDIAC CATHETERIZATION 02/01/2017  Narrative Images from the original result were not included.   The left ventricular systolic function is normal.  LV end diastolic pressure is normal.  The left ventricular ejection fraction is 55-65% by visual estimate.  Linda GRINDE is a 72 y.o. female   Linda Washington LOCATION:  FACILITY: [facility] PHYSICIAN: Quay Burow,  M.D. 03-20-1951   DATE OF PROCEDURE:  02/01/2017  DATE OF DISCHARGE:     CARDIAC CATHETERIZATION    History obtained from chart review. Linda Washington is a 72 year old mildly overweight Caucasian female with positive cardiac risk factors including hypertension, hyperlipidemia, type 2 diabetes, tobacco abuse and family history. She was admitted with unstable angina. Enzymes are negative. Her EKG shows no acute changes. She was referred for cardiac catheterization to define her anatomy and rule out an ischemic etiology.  Impression Ms. Schwandt has normal coronary arteries and normal LV function. I believe her chest pain is noncardiac. The sheath was removed and a TR band was placed on the right wrist to achieve patent hemostasis. The patient left the lab in stable condition. She'll be discharged home later today with outpatient follow-up.  Quay Burow. MD, [facility] 02/01/2017 2:24 PM  Findings Coronary Findings Diagnostic  Dominance: Right  No diagnostic findings have been documented. Intervention  No interventions have been documented.     ECHOCARDIOGRAM  ECHOCARDIOGRAM COMPLETE 01/01/2023  Narrative ECHOCARDIOGRAM REPORT    Patient Name:   EMMY CHARBONNEAU Donigan  Date of Exam: 01/01/2023 Medical Rec #:  Linda Washington     Height:       66.0 in Accession #:    [id number]    Weight:       300.8 lb Date of Birth:  [birth year]     BSA:          2.379 m Patient Age:    52 years      BP:           124/72 mmHg Patient Gender: F             HR:           62 bpm. Exam Location:  [facility]  Procedure: 2D Echo, Cardiac Doppler and Color Doppler  Indications:    Dyspnea R06.00  History:        Patient has prior history of Echocardiogram examinations, most recent 06/22/2018. CHF, Signs/Symptoms:Dyspnea; Risk Factors:Dyslipidemia, Hypertension, GERD, Diabetes and Sleep Apnea.  Sonographer:    Bernadene Person RDCS Referring Phys: [id number] [place]   1. Left ventricular  ejection fraction, by estimation, is 55 to 60%. The left ventricle has normal function. The left ventricle has no regional wall motion abnormalities. Left ventricular diastolic parameters were normal. 2. Right ventricular systolic function is normal. The right ventricular size is normal. There is mildly elevated pulmonary artery systolic pressure. 3. Mild to moderate mitral valve regurgitation. 4. Tricuspid valve regurgitation is mild to moderate. 5. Aortic valve regurgitation is trivial. 6. The inferior vena cava is dilated in size with >50% respiratory variability, suggesting right atrial pressure of 8 mmHg.  FINDINGS Left Ventricle: Left ventricular ejection fraction, by estimation, is 55 to 60%. The left ventricle has normal function. The left ventricle has no regional wall motion abnormalities. The left ventricular internal cavity size was normal in size. There is no left ventricular hypertrophy. Left ventricular diastolic parameters were normal.  Right Ventricle: The right ventricular size is normal. Right ventricular systolic function is normal. There is mildly elevated pulmonary artery systolic pressure. The tricuspid regurgitant velocity is 3.04 m/s, and with an assumed right atrial pressure of 8 mmHg, the estimated right ventricular systolic pressure is 0000000 mmHg.  Left Atrium: Left atrial size was normal in size.  Right Atrium: Right atrial size was normal in size.  Pericardium: There is no evidence of pericardial effusion.  Mitral Valve: Mild to moderate mitral valve regurgitation.  Tricuspid Valve: Tricuspid valve regurgitation is mild to moderate.  Aortic Valve: Aortic valve regurgitation is trivial. Aortic regurgitation PHT measures 568 msec.  Pulmonic Valve: Pulmonic valve regurgitation is not visualized.  Aorta: The aortic root and ascending aorta are structurally normal, with no evidence of dilitation.  Venous: The inferior vena cava is dilated in size with greater  than 50% respiratory variability, suggesting right atrial pressure of 8 mmHg.  IAS/Shunts: No atrial level shunt detected by color flow Doppler.   LEFT VENTRICLE PLAX 2D LVIDd:         4.60 cm      Diastology LVIDs:         3.50 cm      LV e' medial:    10.30 cm/s LV PW:         0.90 cm  LV E/e' medial:  10.1 LV IVS:        0.90 cm      LV e' lateral:   14.30 cm/s LVOT diam:     1.90 cm      LV E/e' lateral: 7.3 LV SV:         66 LV SV Index:   28 LVOT Area:     2.84 cm  LV Volumes (MOD) LV vol d, MOD A2C: 87.2 ml LV vol d, MOD A4C: 121.0 ml LV vol s, MOD A2C: 39.8 ml LV vol s, MOD A4C: 51.0 ml LV SV MOD A2C:     47.4 ml LV SV MOD A4C:     121.0 ml LV SV MOD BP:      63.2 ml  RIGHT VENTRICLE RV S prime:     14.10 cm/s TAPSE (M-mode): 1.6 cm  LEFT ATRIUM             Index        RIGHT ATRIUM           Index LA diam:        5.00 cm 2.10 cm/m   RA Area:     33.50 cm LA Vol (A2C):   71.0 ml 29.85 ml/m  RA Volume:   121.00 ml 50.87 ml/m LA Vol (A4C):   54.0 ml 22.70 ml/m LA Biplane Vol: 62.8 ml 26.40 ml/m AORTIC VALVE LVOT Vmax:         98.50 cm/s LVOT Vmean:        68.700 cm/s LVOT VTI:          0.233 m AI PHT:            568 msec AR Vena Contracta: 0.20 cm  AORTA Ao Root diam: 3.00 cm Ao Asc diam:  3.60 cm  MITRAL VALVE                TRICUSPID VALVE MV Area (PHT): 3.48 cm     TR Peak grad:   37.0 mmHg MV Decel Time: 218 msec     TR Vmax:        304.00 cm/s MV E velocity: 104.00 cm/s MV A velocity: 41.90 cm/s   SHUNTS MV E/A ratio:  2.48         Systemic VTI:  0.23 m Systemic Diam: 1.90 cm  Phineas Inches Electronically signed by Phineas Inches Signature Date/Time: 01/01/2023/4:16:01 PM    Final   TEE  ECHO TEE 06/22/2018  Narrative *[facility]* [address], [place] [address] [phone]  ------------------------------------------------------------------- Transesophageal Echocardiography with  Cardioversion  Patient:    Miria, Finnie MR #:       Linda Washington Study Date: 06/22/2018 Gender:     F Age:        63 Height:     172.7 cm Weight:     134.9 kg BSA:        2.61 m^2 Pt. Status: Room:       3E10C  ADMITTING    Karmen Bongo SONOGRAPHER  Florentina Jenny, [place]    [place], [place], [address] Lorelee Cover. REFERRING    K[place]eger, Daleen Snook M.  cc:  ------------------------------------------------------------------- LV EF: 50% -   55%  ------------------------------------------------------------------- Indications:      Atrial fibrillation - 427.31.  ------------------------------------------------------------------- History:   PMH:   Atrial fibrillation.  ------------------------------------------------------------------- Study Conclusions  - Left ventricle: Systolic  function was normal. The estimated ejection fraction was in the range of 50% to 55%. Wall motion was normal; there were no regional wall motion abnormalities. No evidence of thrombus. - Aortic valve: There was mild regurgitation originating from the commissure between the left coronary and noncoronary cusps. - Mitral valve: There was mild regurgitation directed centrally. - Left atrium: No evidence of thrombus in the atrial cavity or appendage. - Right atrium: No evidence of thrombus in the atrial cavity or appendage. - Atrial septum: A patent foramen ovale cannot be excluded. Trivial color flow noted at FO/intra-atrial septum. - Tricuspid valve: There was mild-moderate regurgitation. - Pulmonic valve: There was trivial regurgitation.  Impressions:  - Successful cardioversion. LAA and RA/RAA thoroughly interrogated, due to muscular LAA and IAS shadowing into RA/RAA. No cardiac source of emboli was indentified.  ------------------------------------------------------------------- Study data:   Study status:  Routine.  Consent:  The  risks, benefits, and alternatives to the procedure were explained to the patient and informed consent was obtained.  Procedure:  The patient reported no pain pre or post test. Initial setup. The patient was brought to the laboratory in the fasting state. A baseline ECG was recorded. Intravenous access was obtained. Surface ECG leads and pulse oximetric signals were monitored. Self-adhesive anterior-posterior defibrillation pads were applied. Sedation. Moderate sedation with intermittent deep sedation was administered during cardioversion by anesthesiology staff. Transesophageal echocardiography. Topical anesthesia was obtained using viscous lidocaine. An adult multiplane transesophageal probe was inserted by the attending cardiologistwithout difficulty. Image quality was adequate. Images were captured in a quad screen format to simplify data comparison. No intracardiac thrombus was identified. Cardioversion. The rhythm was successfully converted from atrial fibrillation to normal sinus rhythm, using 1synchronized shocks with a maximum energy of 120 J.  Study completion:  All IVs inserted during the procedure were removed. The patient tolerated the procedure well. There were no complications.  Administered medications:   Propofol.          Transesophageal echocardiography with cardioversion.  2D and intravenous contrast injection. Birthdate:  Patient birthdate: August 24, 1951.  Age:  Patient is 72 yr old.  Sex:  Gender: female.    BMI: 45.2 kg/m^2.  Blood pressure: 118/62  Patient status:  Inpatient.  Study date:  Study date: 06/22/2018. Study time: 10:31 AM.  Location:  Endoscopy.  -------------------------------------------------------------------  ------------------------------------------------------------------- Left ventricle:  Systolic function was normal. The estimated ejection fraction was in the range of 50% to 55%. Wall motion was normal; there were no regional wall motion  abnormalities.  No evidence of thrombus.  ------------------------------------------------------------------- Aortic valve:   Trileaflet; normal thickness, noncalcified leaflets. Cusp separation was normal. Mobility was not restricted. Doppler:  There was mild regurgitation originating from the commissure between the left coronary and noncoronary cusps.  ------------------------------------------------------------------- Aorta:  There was mild atheromatous plaque.  ------------------------------------------------------------------- Mitral valve:   Structurally normal valve. Normal thickness leaflets . Leaflet separation was normal. Mobility was not restricted.  Doppler:  There was mild regurgitation directed centrally.  ------------------------------------------------------------------- Left atrium:  The atrium was normal in size.  No evidence of thrombus in the atrial cavity or appendage. The appendage was well visualized and morphologically a left appendage.  ------------------------------------------------------------------- Atrial septum:  A patent foramen ovale cannot be excluded. Trivial color flow noted at FO/intra-atrial septum.  ------------------------------------------------------------------- Right ventricle:  Well visualized. The cavity size was normal. Systolic function was normal.  ------------------------------------------------------------------- Pulmonic valve:    Structurally normal valve.    Doppler:  There was trivial regurgitation.  ------------------------------------------------------------------- Tricuspid  valve:  Well visualized.  Structurally normal valve. Doppler:  There was mild-moderate regurgitation.  ------------------------------------------------------------------- Right atrium:  Well visualized. The atrium was normal in size.  No evidence of thrombus in the atrial cavity or  appendage.  ------------------------------------------------------------------- Pericardium:  There was no pericardial effusion.  ------------------------------------------------------------------- Measurements  Tricuspid valve                      Value Tricuspid regurg peak velocity       230   cm/s Tricuspid peak RV-RA gradient        21    mm Hg  Legend: (L)  and  (H)  mark values outside specified reference range.  ------------------------------------------------------------------- Prepared and Electronically Authenticated by  Buford Dresser 2019-09-04T14:24:58             EKG:  EKG is not ordered today.   Recent Labs: 12/04/2022: BNP 61.2 12/14/2022: Hemoglobin 11.9; Platelets 349.0  Recent Lipid Panel    Component Value Date/Time   CHOL 115 10/31/2019 1653   TRIG 65 10/31/2019 1653   HDL 52 10/31/2019 1653   CHOLHDL 2.2 10/31/2019 1653   LDLCALC 49 10/31/2019 1653   Home Medications   Current Meds  Medication Sig   acetaminophen (TYLENOL) 650 MG CR tablet Take 650 mg by mouth every 8 (eight) hours as needed for pain.   atorvastatin (LIPITOR) 40 MG tablet TAKE 1 TABLET AT BEDTIME   budesonide-formoterol (SYMBICORT) 160-4.5 MCG/ACT inhaler Inhale 2 puffs into the lungs in the morning and at bedtime.   carvedilol (COREG) 3.125 MG tablet TAKE 1 TABLET TWICE DAILY WITH MEALS   citalopram (CELEXA) 20 MG tablet Take 20 mg by mouth daily.   Cyanocobalamin (VITAMIN B 12 PO) Take 1,000 mg by mouth daily.   empagliflozin (JARDIANCE) 10 MG TABS tablet Take 1 tablet (10 mg total) by mouth daily before breakfast.   empagliflozin (JARDIANCE) 10 MG TABS tablet Take 1 tablet (10 mg total) by mouth daily before breakfast.   empagliflozin (JARDIANCE) 10 MG TABS tablet Take 1 tablet (10 mg total) by mouth daily before breakfast.   fluticasone (FLONASE) 50 MCG/ACT nasal spray Place 1-2 sprays into both nostrils daily as needed for allergies or rhinitis.   furosemide (LASIX)  40 MG tablet Take 1 tablet (40 mg total) by mouth daily.   metFORMIN (GLUCOPHAGE) 1000 MG tablet Take 1,000 mg by mouth 2 (two) times daily with a meal.   NOVOLOG FLEXPEN 100 UNIT/ML FlexPen Inject 4 Units into the skin daily as needed. If Blood Sugar>200=Take 4 units   omeprazole (PRILOSEC) 40 MG capsule Take 40 mg by mouth daily.   oxybutynin (DITROPAN XL) 15 MG 24 hr tablet Take 15 mg by mouth daily.   Polyethyl Glycol-Propyl Glycol (SYSTANE OP) Place 1-2 drops into both eyes daily as needed (dry eyes).   warfarin (COUMADIN) 5 MG tablet TAKE AS DIRECTED BY COUMADIN CLINIC   Current Facility-Administered Medications for the 01/05/23 encounter (Office Visit) with Loel Dubonnet, NP  Medication   betamethasone acetate-betamethasone sodium phosphate (CELESTONE) injection 3 mg     Review of Systems      All other systems reviewed and are otherwise negative except as noted above.  Physical Exam    VS:  BP (!) 143/82 Comment: average home BP  Pulse 63   Ht 5\' 6"  (1.676 m)   Wt (!) 301 lb (136.5 kg)   BMI 48.58 kg/m  , BMI Body mass index is 48.58 kg/m.  Wt Readings from Last 3 Encounters:  01/05/23 (!) 301 lb (136.5 kg)  12/24/22 (!) 300 lb 12.8 oz (136.4 kg)  12/14/22 299 lb 3.2 oz (135.7 kg)    GEN: Well nourished, overweight, well developed, in no acute distress. HEENT: normal. Neck: Supple, no JVD, carotid bruits, or masses. Cardiac: IRIR, no murmurs, rubs, or gallops. No clubbing, cyanosis, edema.  Radials/PT 2+ and equal bilaterally.  Respiratory:  Respirations regular and unlabored, clear to auscultation bilaterally. GI: Soft, nontender, nondistended. MS: No deformity or atrophy. Skin: Warm and dry, no rash. Neuro:  Strength and sensation are intact. Psych: Normal affect.  Assessment & Plan    Persistent atrial flutter/hypercoagulable state- rate controlled atrial fibrillation by asucultation today. CHA2DS2-VASc Score = 5 [CHF History: 1, HTN History: 1, Diabetes  History: 1, Stroke History: 0, Vascular Disease History: 0, Age Score: 1, Gender Score: 1].  Therefore, the patient's annual risk of stroke is 7.2 %.    Continue Warfarin. Heart rate at home routinely in the 60s making less likely that atrial fib contributory to dyspnea. Could consider short ZIO to assess rate control at follow up but will defer today as she has CT upcoming.   Chronic diastolic heart failure- Volume status difficult to determine with body habitus. Echo 01/01/23 normal LVEF, normal diastolic function, mild to moderate MR/TR, mild AR, mildly elevated PASP, RA pressure 27mmHg. Weight up another 2 lbs since last clinic visit. Previous BNP normal but may be falsely low given body habitus. Will increase Lasix to 40mg  BID x 3 days then return to 40mg  QD with additional tablet PRN. Start Jardiance 10mg  QD (previously tolerated but was too expensive, insurance coverage now better and provided 2 weeks of samples as well as patient assistance paperwork in clinic).  BMP in 1-2 weeks for monitoring.  Chronic bronchitis-follows with Dr. Kimber Relic.  Congestion improved since prednisone but still with persistent dyspnea.  Has CT chest high-resolution upcoming -  awaiting scheduling.  HTN - BP not at goal but recent stressors including losing her sister. Condolences offered, has good support system. Will continue current antihypertensive regimen and monitor at home. Adding Jardiance and increasing Lasix, as above. Check in via MyChart in one week.   Hyperlipidemia- 08/2022 LDL 45. Continue Atorvastatin 40mg  QD.   OSA-has not used CPAP in 2 years.  Reports her nasal pillow mask was previously uncomfortable and would wake her up from sleep.  She inquired about inspire device and discussed that insurance would not approve given her BMI.  Will route to Dr. Radford Pax as she may benefit from an office visit to discuss sleep apnea and treatment.  We briefly reviewed that untreated OSA would contribute to difficulty losing  weight, diastolic heart failure, hypertension.   Obesity / BMI 45 - Weight loss via diet and exercise encouraged. Discussed the impact being overweight would have on cardiovascular risk.  Plans to participate in PREP exercise program in May.  Has not been able to get Trulicity in 2 months due to delays in patient assistance paperwork and is following with endocrinology-encouraged her to reach back out to endocrinology office and consider further increasing dose to assist with weight loss once able to obtain.  HYPERTENSION CONTROL Vitals:   01/05/23 1016 01/05/23 1307  BP: (!) 142/80 (!) 143/82    The patient's blood pressure is elevated above target today.  In order to address the patient's elevated BP: Blood pressure will be monitored at home to determine if medication changes need to be made.;  Follow up with general cardiology has been recommended.         Disposition: Follow up in 2 month(s) with Candee Furbish, MD or APP.  Signed, Loel Dubonnet, NP 01/05/2023, 1:11 PM [facility]

## 2023-01-05 NOTE — Patient Instructions (Addendum)
Medication Instructions:  Your physician has recommended you make the following change in your medication:  CHANGE Furosemide to twice per day for 3 days then return to once daily with additional tablet as needed  START Jardiance 10mg  daily- we will send this to our prior auth team for insurance approval, please use the samples given today until we get this approved and we will let you know when to grab from the pharmacy.   *If you need a refill on your cardiac medications before your next appointment, please call your pharmacy*   Lab Work: Your physician recommends that you return for lab work on the day of Coumadin Clinic appointment: BMP   Follow-Up: Your next appointment:   With Dr. Marlou Porch on 02/24/23- Schedule for this day at [place] please

## 2023-01-05 NOTE — Telephone Encounter (Signed)
-----   Message from Maralyn Sago, CPhT sent at 01/05/2023 12:18 PM EDT ----- Robby Sermon,  I ran a test claim and no p/a is needed for this medication. I called and spoke with the patients pharmacy and this rx is currently being processed for 135.00 for a 90 day supply. ----- Message ----- From: Gerald Stabs, RN Sent: 01/05/2023  10:59 AM EDT To: Rx Prior Auth Team  Patient started on jardiance 10mg  today in clinic, may need prior auth. Thanks!

## 2023-01-12 ENCOUNTER — Encounter (HOSPITAL_BASED_OUTPATIENT_CLINIC_OR_DEPARTMENT_OTHER): Payer: Self-pay

## 2023-01-13 ENCOUNTER — Encounter (HOSPITAL_BASED_OUTPATIENT_CLINIC_OR_DEPARTMENT_OTHER): Payer: Self-pay

## 2023-01-13 DIAGNOSIS — G4733 Obstructive sleep apnea (adult) (pediatric): Secondary | ICD-10-CM

## 2023-01-14 ENCOUNTER — Telehealth (HOSPITAL_BASED_OUTPATIENT_CLINIC_OR_DEPARTMENT_OTHER): Payer: Self-pay

## 2023-01-14 NOTE — Telephone Encounter (Signed)
Missing information on patient assitance application.   Called patient to gather information, no answer, left message to call back.

## 2023-01-15 ENCOUNTER — Other Ambulatory Visit (HOSPITAL_BASED_OUTPATIENT_CLINIC_OR_DEPARTMENT_OTHER): Payer: Self-pay | Admitting: Cardiology

## 2023-01-15 DIAGNOSIS — J0100 Acute maxillary sinusitis, unspecified: Secondary | ICD-10-CM | POA: Diagnosis not present

## 2023-01-15 NOTE — Telephone Encounter (Signed)
Application updated and faxed back

## 2023-01-15 NOTE — Telephone Encounter (Signed)
Patient states her household income is (annual, single) $29,200.

## 2023-01-20 ENCOUNTER — Ambulatory Visit: Payer: Medicare HMO | Attending: Cardiology

## 2023-01-20 DIAGNOSIS — I4892 Unspecified atrial flutter: Secondary | ICD-10-CM

## 2023-01-20 DIAGNOSIS — Z5181 Encounter for therapeutic drug level monitoring: Secondary | ICD-10-CM | POA: Diagnosis not present

## 2023-01-20 DIAGNOSIS — I5032 Chronic diastolic (congestive) heart failure: Secondary | ICD-10-CM | POA: Diagnosis not present

## 2023-01-20 LAB — POCT INR: INR: 2.6 (ref 2.0–3.0)

## 2023-01-20 NOTE — Patient Instructions (Signed)
Description   Continue taking Warfarin 1.5 tablets daily except 1 tablet on Sundays and Thursdays.  Keep leafy veggies consistent.  Recheck INR in 5 weeks.  [facility] [phone].

## 2023-01-21 ENCOUNTER — Telehealth: Payer: Self-pay | Admitting: *Deleted

## 2023-01-21 ENCOUNTER — Encounter (HOSPITAL_BASED_OUTPATIENT_CLINIC_OR_DEPARTMENT_OTHER): Payer: Self-pay

## 2023-01-21 LAB — BASIC METABOLIC PANEL
BUN/Creatinine Ratio: 15 (ref 12–28)
BUN: 14 mg/dL (ref 8–27)
CO2: 23 mmol/L (ref 20–29)
Calcium: 9.6 mg/dL (ref 8.7–10.3)
Chloride: 102 mmol/L (ref 96–106)
Creatinine, Ser: 0.93 mg/dL (ref 0.57–1.00)
Glucose: 183 mg/dL — ABNORMAL HIGH (ref 70–99)
Potassium: 3.8 mmol/L (ref 3.5–5.2)
Sodium: 141 mmol/L (ref 134–144)
eGFR: 64 mL/min/{1.73_m2} (ref >59)

## 2023-01-21 NOTE — Telephone Encounter (Signed)
Prior Authorization for split night sleep study sent to [facility] via web portal. Approval Number [id number]. Valid dates 01/26/23 to 04/26/23.

## 2023-01-25 ENCOUNTER — Ambulatory Visit (INDEPENDENT_AMBULATORY_CARE_PROVIDER_SITE_OTHER): Payer: Medicare HMO | Admitting: Podiatry

## 2023-01-25 ENCOUNTER — Encounter: Payer: Self-pay | Admitting: Podiatry

## 2023-01-25 DIAGNOSIS — B351 Tinea unguium: Secondary | ICD-10-CM

## 2023-01-25 DIAGNOSIS — M79675 Pain in left toe(s): Secondary | ICD-10-CM | POA: Diagnosis not present

## 2023-01-25 DIAGNOSIS — E119 Type 2 diabetes mellitus without complications: Secondary | ICD-10-CM

## 2023-01-25 NOTE — Progress Notes (Signed)
This patient returns to my office for at risk foot care.  This patient requires this care by a professional since this patient will be at risk due to having diabetes mellitus.and coagulation defect.   This patient is unable to cut nails herself since the patient cannot reach her nails.These nails are painful walking and wearing shoes.  This patient presents for at risk foot care today. ? ?General Appearance  Alert, conversant and in no acute stress. ? ?Vascular  Dorsalis pedis and posterior tibial  pulses are weakly  palpable  bilaterally.  Capillary return is within normal limits  bilaterally. Temperature is within normal limits  bilaterally. ? ?Neurologic  Senn-Weinstein monofilament wire test diminished   bilaterally. Muscle power within normal limits bilaterally. ? ?Nails Thick disfigured discolored nails with subungual debris  Hallux nails. No evidence of bacterial infection or drainage bilaterally. ? ?Orthopedic  No limitations of motion  feet .  No crepitus or effusions noted.  HAV  B/L.  Hammer toes  B/L.  Haglunds deformity right foot.  Plantar flexed fifth metatarsal  B/L. ? ?Skin  normotropic skin  noted bilaterally.  No signs of infections or ulcers noted.   Porokeratosis sub 5th met  B/L asymptomatic. ? ?Onychomycosis  Pain in right toes  Pain in left toes   ? ?Consent was obtained for treatment procedures.   Mechanical debridement of nails 1-5  bilaterally performed with a nail nipper.  Filed with dremel without incident. ? ? ?Return office visit    3 months                  Told patient to return for periodic foot care and evaluation due to potential at risk complications. ? ? ?Laquenta Whitsell DPM  ?

## 2023-02-15 ENCOUNTER — Ambulatory Visit (HOSPITAL_BASED_OUTPATIENT_CLINIC_OR_DEPARTMENT_OTHER): Payer: Medicare HMO | Attending: Family | Admitting: Cardiovascular Disease

## 2023-02-15 VITALS — Ht 66.0 in | Wt 284.0 lb

## 2023-02-15 DIAGNOSIS — G4736 Sleep related hypoventilation in conditions classified elsewhere: Secondary | ICD-10-CM | POA: Diagnosis not present

## 2023-02-15 DIAGNOSIS — G4733 Obstructive sleep apnea (adult) (pediatric): Secondary | ICD-10-CM

## 2023-02-15 DIAGNOSIS — R0683 Snoring: Secondary | ICD-10-CM

## 2023-02-15 DIAGNOSIS — G478 Other sleep disorders: Secondary | ICD-10-CM

## 2023-02-19 ENCOUNTER — Telehealth: Payer: Self-pay

## 2023-02-19 NOTE — Telephone Encounter (Signed)
Call to pt reference PREP referral-starting new group on 03/08/23.  Has f/u with MD next week and will let me know so we can sched her intake. She has my number for call back.

## 2023-02-24 ENCOUNTER — Encounter (HOSPITAL_BASED_OUTPATIENT_CLINIC_OR_DEPARTMENT_OTHER): Payer: Self-pay | Admitting: Cardiology

## 2023-02-24 ENCOUNTER — Ambulatory Visit (HOSPITAL_BASED_OUTPATIENT_CLINIC_OR_DEPARTMENT_OTHER): Payer: Medicare HMO | Admitting: Cardiology

## 2023-02-24 ENCOUNTER — Ambulatory Visit: Payer: Medicare HMO | Attending: Cardiology | Admitting: *Deleted

## 2023-02-24 VITALS — BP 132/84 | HR 68 | Ht 66.0 in | Wt 289.2 lb

## 2023-02-24 DIAGNOSIS — I5032 Chronic diastolic (congestive) heart failure: Secondary | ICD-10-CM | POA: Diagnosis not present

## 2023-02-24 DIAGNOSIS — I4892 Unspecified atrial flutter: Secondary | ICD-10-CM | POA: Diagnosis not present

## 2023-02-24 DIAGNOSIS — G4733 Obstructive sleep apnea (adult) (pediatric): Secondary | ICD-10-CM

## 2023-02-24 DIAGNOSIS — Z6841 Body Mass Index (BMI) 40.0 and over, adult: Secondary | ICD-10-CM | POA: Diagnosis not present

## 2023-02-24 DIAGNOSIS — Z5181 Encounter for therapeutic drug level monitoring: Secondary | ICD-10-CM

## 2023-02-24 LAB — POCT INR: INR: 1.9 — AB (ref 2.0–3.0)

## 2023-02-24 NOTE — Progress Notes (Signed)
Cardiology Office Note:    Date:  02/24/2023   ID:  Linda Washington, DOB 08-Aug-1951, MRN [id number]  PCP:  Jarrett Soho, PA-C   [facility] Providers Cardiologist:  Donato Schultz, MD     Referring MD: Jarrett Soho, PA-C    History of Present Illness:    Linda Washington is a 72 y.o. female here for follow-up persistent atrial flutter on chronic anticoagulation chronic diastolic heart failure obstructive sleep apnea obesity hyperlipidemia.  Seen by Gillian Shields previously  Had TEE cardioversion but then quickly reverted back to atrial flutter the next day and has been maintained with rate control.  EKG from earlier this year shows underlying atrial flutter with good overall rate control.  Personally reviewed and interpreted.  Cardiac catheterization 2018 demonstrated normal coronary arteries  This year she lost her sister.  She was not able to tolerate nasal pillows CPAP.  She did undergo another sleep study.  She has a device that is blowing air into her nostril she states that she was able to tolerate.    Past Medical History:  Diagnosis Date   Acute on chronic diastolic (congestive) heart failure (HCC) 06/2018   Anxiety    takes Citalopram daily   Arthritis    generalized   Basal cell carcinoma    GERD (gastroesophageal reflux disease)    takes Omeprazole daily   History of Bell's palsy    left side   History of bronchitis    History of hiatal hernia    History of kidney stones    Hyperlipemia    takes Simvastatin daily   Hypertension    takes Enalapril daily   Hypothyroidism    Incontinence    takes Ditropan daily   Joint swelling    left knee   Neuropathy    takes Gabapentin daily   PONV (postoperative nausea and vomiting)    RLS (restless legs syndrome)    Seasonal allergies    takes Claritin daioy as needed   Sleep apnea    pt. states that she does not have a CPAP   Tremor    Type 2 diabetes mellitus (HCC)    takes Metformin and  Amaryl daily    Past Surgical History:  Procedure Laterality Date   ACHILLES TENDON REPAIR Right    APPENDECTOMY     BASAL CELL CARCINOMA EXCISION     Nose   BREAST LUMPECTOMY Left    CARDIAC CATHETERIZATION     pt. states approximately 15 years ago   CARDIOVERSION N/A 06/22/2018   Procedure: CARDIOVERSION;  Surgeon: Jodelle Red, MD;  Location: [facility] ENDOSCOPY;  Service: Cardiovascular;  Laterality: N/A;   CARPAL TUNNEL RELEASE Bilateral    DILATION AND CURETTAGE OF UTERUS     EYE SURGERY Bilateral    cataract surgery   KNEE ARTHROSCOPY     Right   LEFT HEART CATH AND CORONARY ANGIOGRAPHY N/A 02/01/2017   Procedure: Left Heart Cath and Coronary Angiography;  Surgeon: Runell Gess, MD;  Location: [facility] INVASIVE CV LAB;  Service: Cardiovascular;  Laterality: N/A;   LITHOTRIPSY  2013   TEE WITHOUT CARDIOVERSION N/A 06/22/2018   Procedure: TRANSESOPHAGEAL ECHOCARDIOGRAM (TEE);  Surgeon: Jodelle Red, MD;  Location: [facility] ENDOSCOPY;  Service: Cardiovascular;  Laterality: N/A;   TOTAL ABDOMINAL HYSTERECTOMY     TOTAL KNEE ARTHROPLASTY Left 04/08/2015   Procedure: LEFT TOTAL KNEE ARTHROPLASTY;  Surgeon: Gean Birchwood, MD;  Location: [facility] OR;  Service: Orthopedics;  Laterality: Left;  Current Medications: Current Meds  Medication Sig   acetaminophen (TYLENOL) 650 MG CR tablet Take 650 mg by mouth every 8 (eight) hours as needed for pain.   atorvastatin (LIPITOR) 40 MG tablet Take 1 tablet (40 mg total) by mouth at bedtime.   budesonide-formoterol (SYMBICORT) 160-4.5 MCG/ACT inhaler Inhale 2 puffs into the lungs in the morning and at bedtime.   carvedilol (COREG) 3.125 MG tablet TAKE 1 TABLET TWICE DAILY WITH MEALS   citalopram (CELEXA) 20 MG tablet Take 20 mg by mouth daily.   Cyanocobalamin (VITAMIN B 12 PO) Take 1,000 mg by mouth daily.   empagliflozin (JARDIANCE) 10 MG TABS tablet Take 1 tablet (10 mg total) by mouth daily before breakfast.   fluticasone (FLONASE) 50  MCG/ACT nasal spray Place 1-2 sprays into both nostrils daily as needed for allergies or rhinitis.   furosemide (LASIX) 40 MG tablet Take 1 tablet (40 mg total) by mouth daily.   metFORMIN (GLUCOPHAGE) 1000 MG tablet Take 1,000 mg by mouth 2 (two) times daily with a meal.   NOVOLOG FLEXPEN 100 UNIT/ML FlexPen Inject 4 Units into the skin daily as needed. If Blood Sugar>200=Take 4 units   omeprazole (PRILOSEC) 40 MG capsule Take 40 mg by mouth daily.   oxybutynin (DITROPAN XL) 15 MG 24 hr tablet Take 15 mg by mouth daily.   Polyethyl Glycol-Propyl Glycol (SYSTANE OP) Place 1-2 drops into both eyes daily as needed (dry eyes).   TRULICITY 0.75 MG/0.5ML SOPN Inject 0.75 mg into the skin once a week. Take on Wed   warfarin (COUMADIN) 5 MG tablet TAKE AS DIRECTED BY COUMADIN CLINIC   Current Facility-Administered Medications for the 02/24/23 encounter (Office Visit) with Jake Bathe, MD  Medication   betamethasone acetate-betamethasone sodium phosphate (CELESTONE) injection 3 mg     Allergies:   Cyclobenzaprine, Gabapentin, Hydrocodone, Methocarbamol, Oxycodone, Tinidazole, Latex, and Other   Social History   Socioeconomic History   Marital status: Divorced    Spouse name: Not on file   Number of children: Not on file   Years of education: Some colle   Highest education level: Not on file  Occupational History   Occupation: part-time receptionist at [employer]  Tobacco Use   Smoking status: Never    Passive exposure: Never   Smokeless tobacco: Never  Vaping Use   Vaping Use: Never used  Substance and Sexual Activity   Alcohol use: Yes    Alcohol/week: 0.0 standard drinks of alcohol    Comment: Rare-wine   Drug use: No   Sexual activity: Not on file  Other Topics Concern   Not on file  Social History Narrative   Lives at home with Colman Cater, roommate   Caffeine use: 1 Coffee day, 1-2 a day of tea, 1-2 sodas a day    Social Determinants of Health   Financial Resource  Strain: Not on file  Food Insecurity: Not on file  Transportation Needs: Not on file  Physical Activity: Not on file  Stress: Not on file  Social Connections: Not on file     Family History: The patient's family history includes CAD (age of onset: 32) in her brother; Heart disease in her father and mother; Heart disease (age of onset: 24) in her brother; Heart failure in her father; Heart failure (age of onset: 9) in her mother.  ROS:   Please see the history of present illness.     All other systems reviewed and are negative.  EKGs/Labs/Other Studies Reviewed:  The following studies were reviewed today: Cardiac Studies & Procedures   CARDIAC CATHETERIZATION  CARDIAC CATHETERIZATION 02/01/2017  Narrative Images from the original result were not included.   The left ventricular systolic function is normal.  LV end diastolic pressure is normal.  The left ventricular ejection fraction is 55-65% by visual estimate.  Linda Washington is a 72 y.o. female   [id number] LOCATION:  FACILITY: [facility] PHYSICIAN: Nanetta Batty, M.D. 16-Mar-1951   DATE OF PROCEDURE:  02/01/2017  DATE OF DISCHARGE:     CARDIAC CATHETERIZATION    History obtained from chart review. Ms. Libert is a 72 year old mildly overweight Caucasian female with positive cardiac risk factors including hypertension, hyperlipidemia, type 2 diabetes, tobacco abuse and family history. She was admitted with unstable angina. Enzymes are negative. Her EKG shows no acute changes. She was referred for cardiac catheterization to define her anatomy and rule out an ischemic etiology.  Impression Ms. Veloso has normal coronary arteries and normal LV function. I believe her chest pain is noncardiac. The sheath was removed and a TR band was placed on the right wrist to achieve patent hemostasis. The patient left the lab in stable condition. She'll be discharged home later today with outpatient follow-up.  Nanetta Batty. MD,  [facility] 02/01/2017 2:24 PM  Findings Coronary Findings Diagnostic  Dominance: Right  No diagnostic findings have been documented. Intervention  No interventions have been documented.     ECHOCARDIOGRAM  ECHOCARDIOGRAM COMPLETE 01/01/2023  Narrative ECHOCARDIOGRAM REPORT    Patient Name:   Linda Washington  Date of Exam: 01/01/2023 Medical Rec #:  [id number]     Height:       66.0 in Accession #:    [id number]    Weight:       300.8 lb Date of Birth:  Nov 19, 1950     BSA:          2.379 m Patient Age:    72 years      BP:           124/72 mmHg Patient Gender: F             HR:           62 bpm. Exam Location:  [address]  Procedure: 2D Echo, Cardiac Doppler and Color Doppler  Indications:    Dyspnea R06.00  History:        Patient has prior history of Echocardiogram examinations, most recent 06/22/2018. CHF, Signs/Symptoms:Dyspnea; Risk Factors:Dyslipidemia, Hypertension, GERD, Diabetes and Sleep Apnea.  Sonographer:    Eulah Pont RDCS Referring Phys: [id number] CAITLIN S WALKER  IMPRESSIONS   1. Left ventricular ejection fraction, by estimation, is 55 to 60%. The left ventricle has normal function. The left ventricle has no regional wall motion abnormalities. Left ventricular diastolic parameters were normal. 2. Right ventricular systolic function is normal. The right ventricular size is normal. There is mildly elevated pulmonary artery systolic pressure. 3. Mild to moderate mitral valve regurgitation. 4. Tricuspid valve regurgitation is mild to moderate. 5. Aortic valve regurgitation is trivial. 6. The inferior vena cava is dilated in size with >50% respiratory variability, suggesting right atrial pressure of 8 mmHg.  FINDINGS Left Ventricle: Left ventricular ejection fraction, by estimation, is 55 to 60%. The left ventricle has normal function. The left ventricle has no regional wall motion abnormalities. The left ventricular internal cavity size was normal in size.  There is no left ventricular hypertrophy. Left ventricular diastolic parameters were normal.  Right Ventricle: The right ventricular  size is normal. Right ventricular systolic function is normal. There is mildly elevated pulmonary artery systolic pressure. The tricuspid regurgitant velocity is 3.04 m/s, and with an assumed right atrial pressure of 8 mmHg, the estimated right ventricular systolic pressure is 45.0 mmHg.  Left Atrium: Left atrial size was normal in size.  Right Atrium: Right atrial size was normal in size.  Pericardium: There is no evidence of pericardial effusion.  Mitral Valve: Mild to moderate mitral valve regurgitation.  Tricuspid Valve: Tricuspid valve regurgitation is mild to moderate.  Aortic Valve: Aortic valve regurgitation is trivial. Aortic regurgitation PHT measures 568 msec.  Pulmonic Valve: Pulmonic valve regurgitation is not visualized.  Aorta: The aortic root and ascending aorta are structurally normal, with no evidence of dilitation.  Venous: The inferior vena cava is dilated in size with greater than 50% respiratory variability, suggesting right atrial pressure of 8 mmHg.  IAS/Shunts: No atrial level shunt detected by color flow Doppler.   LEFT VENTRICLE PLAX 2D LVIDd:         4.60 cm      Diastology LVIDs:         3.50 cm      LV e' medial:    10.30 cm/s LV PW:         0.90 cm      LV E/e' medial:  10.1 LV IVS:        0.90 cm      LV e' lateral:   14.30 cm/s LVOT diam:     1.90 cm      LV E/e' lateral: 7.3 LV SV:         66 LV SV Index:   28 LVOT Area:     2.84 cm  LV Volumes (MOD) LV vol d, MOD A2C: 87.2 ml LV vol d, MOD A4C: 121.0 ml LV vol s, MOD A2C: 39.8 ml LV vol s, MOD A4C: 51.0 ml LV SV MOD A2C:     47.4 ml LV SV MOD A4C:     121.0 ml LV SV MOD BP:      63.2 ml  RIGHT VENTRICLE RV S prime:     14.10 cm/s TAPSE (M-mode): 1.6 cm  LEFT ATRIUM             Index        RIGHT ATRIUM           Index LA diam:        5.00 cm 2.10  cm/m   RA Area:     33.50 cm LA Vol (A2C):   71.0 ml 29.85 ml/m  RA Volume:   121.00 ml 50.87 ml/m LA Vol (A4C):   54.0 ml 22.70 ml/m LA Biplane Vol: 62.8 ml 26.40 ml/m AORTIC VALVE LVOT Vmax:         98.50 cm/s LVOT Vmean:        68.700 cm/s LVOT VTI:          0.233 m AI PHT:            568 msec AR Vena Contracta: 0.20 cm  AORTA Ao Root diam: 3.00 cm Ao Asc diam:  3.60 cm  MITRAL VALVE                TRICUSPID VALVE MV Area (PHT): 3.48 cm     TR Peak grad:   37.0 mmHg MV Decel Time: 218 msec     TR Vmax:        304.00 cm/s MV E  velocity: 104.00 cm/s MV A velocity: 41.90 cm/s   SHUNTS MV E/A ratio:  2.48         Systemic VTI:  0.23 m Systemic Diam: 1.90 cm  Carolan Clines Electronically signed by Carolan Clines Signature Date/Time: 01/01/2023/4:16:01 PM    Final   TEE  ECHO TEE 06/22/2018  Narrative *[place]* *[facility]* [address] [place], [place] [address] [phone]  ------------------------------------------------------------------- Transesophageal Echocardiography with Cardioversion  Patient:    Linda Washington, Linda Washington MR #:       [id number] Study Date: 06/22/2018 Gender:     F Age:        38 Height:     172.7 cm Weight:     134.9 kg BSA:        2.61 m^2 Pt. Status: Room:       3E10C  ADMITTING    Jonah Blue SONOGRAPHER  Perley Jain, RDCS ATTENDING    Elease Etienne PERFORMING   Cristal Deer [address], Ovidio Kin. REFERRING    Kroeger, Dot Lanes M.  cc:  ------------------------------------------------------------------- LV EF: 50% -   55%  ------------------------------------------------------------------- Indications:      Atrial fibrillation - 427.31.  ------------------------------------------------------------------- History:   PMH:   Atrial fibrillation.  ------------------------------------------------------------------- Study Conclusions  - Left ventricle: Systolic function was normal.  The estimated ejection fraction was in the range of 50% to 55%. Wall motion was normal; there were no regional wall motion abnormalities. No evidence of thrombus. - Aortic valve: There was mild regurgitation originating from the commissure between the left coronary and noncoronary cusps. - Mitral valve: There was mild regurgitation directed centrally. - Left atrium: No evidence of thrombus in the atrial cavity or appendage. - Right atrium: No evidence of thrombus in the atrial cavity or appendage. - Atrial septum: A patent foramen ovale cannot be excluded. Trivial color flow noted at FO/intra-atrial septum. - Tricuspid valve: There was mild-moderate regurgitation. - Pulmonic valve: There was trivial regurgitation.  Impressions:  - Successful cardioversion. LAA and RA/RAA thoroughly interrogated, due to muscular LAA and IAS shadowing into RA/RAA. No cardiac source of emboli was indentified.  ------------------------------------------------------------------- Study data:   Study status:  Routine.  Consent:  The risks, benefits, and alternatives to the procedure were explained to the patient and informed consent was obtained.  Procedure:  The patient reported no pain pre or post test. Initial setup. The patient was brought to the laboratory in the fasting state. A baseline ECG was recorded. Intravenous access was obtained. Surface ECG leads and pulse oximetric signals were monitored. Self-adhesive anterior-posterior defibrillation pads were applied. Sedation. Moderate sedation with intermittent deep sedation was administered during cardioversion by anesthesiology staff. Transesophageal echocardiography. Topical anesthesia was obtained using viscous lidocaine. An adult multiplane transesophageal probe was inserted by the attending cardiologistwithout difficulty. Image quality was adequate. Images were captured in a quad screen format to simplify data comparison. No intracardiac  thrombus was identified. Cardioversion. The rhythm was successfully converted from atrial fibrillation to normal sinus rhythm, using 1synchronized shocks with a maximum energy of 120 J.  Study completion:  All IVs inserted during the procedure were removed. The patient tolerated the procedure well. There were no complications.  Administered medications:   Propofol.          Transesophageal echocardiography with cardioversion.  2D and intravenous contrast injection. Birthdate:  Patient birthdate: 04-21-1951.  Age:  Patient is 72 yr old.  Sex:  Gender: female.    BMI: 45.2  kg/m^2.  Blood pressure: 118/62  Patient status:  Inpatient.  Study date:  Study date: 06/22/2018. Study time: 10:31 AM.  Location:  Endoscopy.  -------------------------------------------------------------------  ------------------------------------------------------------------- Left ventricle:  Systolic function was normal. The estimated ejection fraction was in the range of 50% to 55%. Wall motion was normal; there were no regional wall motion abnormalities.  No evidence of thrombus.  ------------------------------------------------------------------- Aortic valve:   Trileaflet; normal thickness, noncalcified leaflets. Cusp separation was normal. Mobility was not restricted. Doppler:  There was mild regurgitation originating from the commissure between the left coronary and noncoronary cusps.  ------------------------------------------------------------------- Aorta:  There was mild atheromatous plaque.  ------------------------------------------------------------------- Mitral valve:   Structurally normal valve. Normal thickness leaflets . Leaflet separation was normal. Mobility was not restricted.  Doppler:  There was mild regurgitation directed centrally.  ------------------------------------------------------------------- Left atrium:  The atrium was normal in size.  No evidence of thrombus in the atrial  cavity or appendage. The appendage was well visualized and morphologically a left appendage.  ------------------------------------------------------------------- Atrial septum:  A patent foramen ovale cannot be excluded. Trivial color flow noted at FO/intra-atrial septum.  ------------------------------------------------------------------- Right ventricle:  Well visualized. The cavity size was normal. Systolic function was normal.  ------------------------------------------------------------------- Pulmonic valve:    Structurally normal valve.    Doppler:  There was trivial regurgitation.  ------------------------------------------------------------------- Tricuspid valve:  Well visualized.  Structurally normal valve. Doppler:  There was mild-moderate regurgitation.  ------------------------------------------------------------------- Right atrium:  Well visualized. The atrium was normal in size.  No evidence of thrombus in the atrial cavity or appendage.  ------------------------------------------------------------------- Pericardium:  There was no pericardial effusion.  ------------------------------------------------------------------- Measurements  Tricuspid valve                      Value Tricuspid regurg peak velocity       230   cm/s Tricuspid peak RV-RA gradient        21    mm Hg  Legend: (L)  and  (H)  Linda Washington values outside specified reference range.  ------------------------------------------------------------------- Prepared and Electronically Authenticated by  Jodelle Red 2019-09-04T14:24:58             EKG: 12/04/2022-atrial flutter heart rate in the 69.  Recent Labs: 12/04/2022: BNP 61.2 12/14/2022: Hemoglobin 11.9; Platelets 349.0 01/20/2023: BUN 14; Creatinine, Ser 0.93; Potassium 3.8; Sodium 141  Recent Lipid Panel    Component Value Date/Time   CHOL 115 10/31/2019 1653   TRIG 65 10/31/2019 1653   HDL 52 10/31/2019 1653   CHOLHDL 2.2  10/31/2019 1653   LDLCALC 49 10/31/2019 1653          Physical Exam:    VS:  BP 132/84   Pulse 68   Ht 5\' 6"  (1.676 m)   Wt 289 lb 3.2 oz (131.2 kg)   SpO2 98%   BMI 46.68 kg/m     Wt Readings from Last 3 Encounters:  02/24/23 289 lb 3.2 oz (131.2 kg)  02/15/23 284 lb (128.8 kg)  01/05/23 (!) 301 lb (136.5 kg)     GEN:  Well nourished, well developed in no acute distress HEENT: Normal NECK: No JVD; No carotid bruits LYMPHATICS: No lymphadenopathy CARDIAC: RRR, no murmurs, rubs, gallops RESPIRATORY:  Clear to auscultation without rales, wheezing or rhonchi  ABDOMEN: Soft, non-tender, non-distended MUSCULOSKELETAL:  No edema; No deformity  SKIN: Warm and dry NEUROLOGIC:  Alert and oriented x 3 PSYCHIATRIC:  Normal affect   ASSESSMENT:    1. Atrial flutter with controlled response (  HCC)   2. OSA (obstructive sleep apnea)   3. Chronic diastolic heart failure (HCC)   4. BMI 45.0-49.9, adult (HCC)    PLAN:    In order of problems listed above:  Persistent atrial flutter -Continue with rate control strategy  Previously failed cardioversion  Chronic anticoagulation - CHA2DS2-VASc is 5.  Warfarin.  Used to be on Xarelto.  She did have a fall, has a hematoma.  Hard for her to sleep on that side.  Discussed, it would take quite a bit of time for this to clear.  Chronic diastolic heart failure - Echo as above normal EF.  Jardiance was started at last visit.   Chronic bronchitis - Follows with Dr. De Hollingshead  Hypertension - Continue to monitor.  Hyperlipidemia - LDL 45 on atorvastatin 40.  Excellent.  Morbid obesity - Continue to encourage weight loss.  Had plans to participate in PREP exercise program in May.    82-month follow-up with Caitlin      Medication Adjustments/Labs and Tests Ordered: Current medicines are reviewed at length with the patient today.  Concerns regarding medicines are outlined above.  No orders of the defined types were placed in this  encounter.  No orders of the defined types were placed in this encounter.   Patient Instructions  Medication Instructions:  Your physician recommends that you continue on your current medications as directed. Please refer to the Current Medication list given to you today.  *If you need a refill on your cardiac medications before your next appointment, please call your pharmacy*   Follow-Up: At [facility], you and your health needs are our priority.  As part of our continuing mission to provide you with exceptional heart care, we have created designated Provider Care Teams.  These Care Teams include your primary Cardiologist (physician) and Advanced Practice Providers (APPs -  Physician Assistants and Nurse Practitioners) who all work together to provide you with the care you need, when you need it.  We recommend signing up for the patient portal called "MyChart".  Sign up information is provided on this After Visit Summary.  MyChart is used to connect with patients for Virtual Visits (Telemedicine).  Patients are able to view lab/test results, encounter notes, upcoming appointments, etc.  Non-urgent messages can be sent to your provider as well.   To learn more about what you can do with MyChart, go to [email or web address].    Your next appointment:   6 month(s)  Provider:   Gillian Shields, NP      Signed, Donato Schultz, MD  02/24/2023 11:31 AM    [facility]

## 2023-02-24 NOTE — Patient Instructions (Signed)
Medication Instructions:  Your physician recommends that you continue on your current medications as directed. Please refer to the Current Medication list given to you today.  *If you need a refill on your cardiac medications before your next appointment, please call your pharmacy*   Follow-Up: At [facility], you and your health needs are our priority.  As part of our continuing mission to provide you with exceptional heart care, we have created designated Provider Care Teams.  These Care Teams include your primary Cardiologist (physician) and Advanced Practice Providers (APPs -  Physician Assistants and Nurse Practitioners) who all work together to provide you with the care you need, when you need it.  We recommend signing up for the patient portal called "MyChart".  Sign up information is provided on this After Visit Summary.  MyChart is used to connect with patients for Virtual Visits (Telemedicine).  Patients are able to view lab/test results, encounter notes, upcoming appointments, etc.  Non-urgent messages can be sent to your provider as well.   To learn more about what you can do with MyChart, go to [email or web address].    Your next appointment:   6 month(s)  Provider:   Gillian Shields, NP

## 2023-02-24 NOTE — Patient Instructions (Signed)
Description   Today take 2 tablets then continue taking Warfarin 1.5 tablets daily except 1 tablet on Sundays and Thursdays.  Keep leafy veggies consistent.  Recheck INR in 5 weeks.  Coumadin Clinic [phone].

## 2023-02-26 ENCOUNTER — Telehealth: Payer: Self-pay

## 2023-02-26 ENCOUNTER — Encounter (HOSPITAL_BASED_OUTPATIENT_CLINIC_OR_DEPARTMENT_OTHER): Payer: Self-pay | Admitting: Cardiovascular Disease

## 2023-02-26 NOTE — Telephone Encounter (Signed)
[facility] pt reference starting PREP Md okays start.  Call to pt reference starting on 03/08/23 MW 230p-345p at [facility]. Confirmed interest and schedule Intake scheduled for 03/03/23 at 3pm.  Will meet pt at front desk

## 2023-02-26 NOTE — Procedures (Signed)
Patient Name: Linda, Washington Date: 02/15/2023 Gender: Female D.O.B: 02-20-1951 Age (years): 87 Referring Provider: Alver Sorrow NP Height (inches): 66 Interpreting Physician: Nicki Guadalajara MD, ABSM Weight (lbs): 284 RPSGT: Shelah Lewandowsky BMI: 46 MRN: [id number] Neck Size: 15.00 <br> <br> CLINICAL INFORMATION Sleep Study Type: NPSG  Indication for sleep study: Diabetes, Excessive Daytime Sleepiness, Fatigue, Obesity, OSA, Re-Evaluation, Sleep walking/talking/parasomnias, Snoring  Epworth Sleepiness Score: 12  Most recent polysomnogram dated 08/22/2018 revealed an AHI of 30.7/h and RDI of 34.1/h. Most recent titration study dated 10/26/2018 was optimal with BiPAP at 16/12cm H2O with an AHI of 2.7/h.  SLEEP STUDY TECHNIQUE As per the AASM Manual for the Scoring of Sleep and Associated Events v2.3 (April 2016) with a hypopnea requiring 4% desaturations.  The channels recorded and monitored were frontal, central and occipital EEG, electrooculogram (EOG), submentalis EMG (chin), nasal and oral airflow, thoracic and abdominal wall motion, anterior tibialis EMG, snore microphone, electrocardiogram, and pulse oximetry.  MEDICATIONS acetaminophen (TYLENOL) 650 MG CR tablet atorvastatin (LIPITOR) 40 MG tablet budesonide-formoterol (SYMBICORT) 160-4.5 MCG/ACT inhaler carvedilol (COREG) 3.125 MG tablet citalopram (CELEXA) 20 MG tablet Cyanocobalamin (VITAMIN B 12 PO) empagliflozin (JARDIANCE) 10 MG TABS tablet empagliflozin (JARDIANCE) 10 MG TABS tablet empagliflozin (JARDIANCE) 10 MG TABS tablet fluticasone (FLONASE) 50 MCG/ACT nasal spray furosemide (LASIX) 40 MG tablet metFORMIN (GLUCOPHAGE) 1000 MG tablet NOVOLOG FLEXPEN 100 UNIT/ML FlexPen omeprazole (PRILOSEC) 40 MG capsule oxybutynin (DITROPAN XL) 15 MG 24 hr tablet Polyethyl Glycol-Propyl Glycol (SYSTANE OP) TRULICITY 0.75 MG/0.5ML SOPN Medications self-administered by patient taken the night of the study :  LIPITOR, VASOTEC  SLEEP ARCHITECTURE The study was initiated at 10:43:53 PM and ended at 4:55:57 AM.  Sleep onset time was 76.3 minutes and the sleep efficiency was 50.9%. The total sleep time was 189.5 minutes.  Stage REM latency was 192.0 minutes.  The patient spent 15.6% of the night in stage N1 sleep, 76.3% in stage N2 sleep, 0.0% in stage N3 and 8.2% in REM.  Alpha intrusion was absent.  Supine sleep was 0.00%.  RESPIRATORY PARAMETERS The overall apnea/hypopnea index (AHI) was 2.2 per hour.  The respiratory disturbance index (RDI) was 12.7/h. There were 0 total apneas, including 0 obstructive, 0 central and 0 mixed apneas. There were 7 hypopneas and 33 RERAs.  The AHI during Stage REM sleep was 11.6 per hour.  AHI while supine was N/A per hour.  The mean oxygen saturation was 91.7%. The minimum SpO2 during sleep was 88.0%.  Snoring was noted during this study.  CARDIAC DATA The 2 lead EKG demonstrated sinus rhythm. The mean heart rate was 56.8 beats per minute. Other EKG findings include: PVCs, Atrial Flutter.  LEG MOVEMENT DATA The total PLMS were 0 with a resulting PLMS index of 0.0. Associated arousal with leg movement index was 0.0 .  IMPRESSIONS - Increased upper airway resistance (UARS) without significant obstructive sleep apnea overall (AHI 2.2/h; RDI 12.7/h); however, mild sleep apnea was present during REM sleep (AHI 11.7/h). The overall sleep disturbance severity may be underestimated since the patient slept in a recliner due to broken ribs and had absent supine sleep. - The patient had minimal oxygen desaturation during the study (Min O2 88.0%) - Mild snoring. - EKG findings include PVCs, Atrial Flutter. - Clinically significant periodic limb movements did not occur during sleep. No significant associated arousals.  DIAGNOSIS - Nocturnal Hypoxemia (G47.36) - Increased Upper Airway Resistance Syndrome (UARS)  RECOMMENDATIONS - On this present study patient  does  not meet criteria for CPAP. However, if she remains symptomatic consider a future study when patient can sleep in bed and supine sleep can be achieved. - Effort should be made to optimize nasal and oropharyngeal patency. - Avoid alcohol, sedatives and other CNS depressants that may worsen sleep apnea and disrupt normal sleep architecture. - Sleep hygiene should be reviewed to assess factors that may improve sleep quality. - Weight management (BMI 46) and regular exercise should be initiated or continued if appropriate.  [Electronically signed] 02/26/2023 08:06 AM  Nicki Guadalajara MD, [facility], ABSM Diplomate, American Board of Sleep Medicine  NPI: [id number]  [facility] PH: [phone]   FX: [phone] ACCREDITED BY THE AMERICAN ACADEMY OF SLEEP MEDICINE

## 2023-03-03 NOTE — Progress Notes (Signed)
[facility] PREP Evaluation  Patient Details  Name: Linda Washington MRN: [id number] Date of Birth: Mar 09, 1951 Age: 72 y.o. PCP: Jarrett Soho, PA-C  Vitals:   03/03/23 1517  BP: 112/70  Pulse: 74  SpO2: 96%  Weight: 287 lb 9.6 oz (130.5 kg)     [facility] Eval - 03/03/23 1500       [facility] "PREP" Location   [facility] "PREP" Location [facility]      Referral    Referring Provider Walker    Reason for referral Diabetes;Heart Failure;High Cholesterol;Hypertension;Inactivity;Obesitity/Overweight;Orthopedic    Program Start Date 03/08/23   MW 230p-345p x 12 wks     Measurement   Waist Circumference 54 inches    Hip Circumference 58 inches    Body fat --   Unable to register ? grip strength? has arthritis in hands     Information for Trainer   Goals Lose 24 lbs, have less SOB, Walk easier    Current Exercise some light exercises at home    Orthopedic Concerns Back, Knees, Arthritis    Pertinent Medical History HTN, OSA, [place], A Flutter, GERD, DM, neuropathy, inc lipids    Current Barriers None    Restrictions/Precautions Diabetic snack before exercise;Fall risk;Assistive device    Medications that affect exercise Medication causing dizziness/drowsiness      Timed Up and Go (TUGS)   Timed Up and Go Moderate risk 10-12 seconds      Mobility and Daily Activities   I find it easy to walk up or down two or more flights of stairs. 1    I have no trouble taking out the trash. 4    I do housework such as vacuuming and dusting on my own without difficulty. 2    I can easily lift a gallon of milk (8lbs). 4    I can easily walk a mile. 1    I have no trouble reaching into high cupboards or reaching down to pick up something from the floor. 2    I do not have trouble doing out-door work such as Loss adjuster, chartered, raking leaves, or gardening. 2      Mobility and Daily Activities   I feel younger than my age. 3    I feel independent. 3    I feel energetic. 2    I live an active life.  2     I feel strong. 4    I feel healthy. 1    I feel active as other people my age. 1      How fit and strong are you.   Fit and Strong Total Score 32            Past Medical History:  Diagnosis Date   Acute on chronic diastolic (congestive) heart failure (HCC) 06/2018   Anxiety    takes Citalopram daily   Arthritis    generalized   Basal cell carcinoma    GERD (gastroesophageal reflux disease)    takes Omeprazole daily   History of Bell's palsy    left side   History of bronchitis    History of hiatal hernia    History of kidney stones    Hyperlipemia    takes Simvastatin daily   Hypertension    takes Enalapril daily   Hypothyroidism    Incontinence    takes Ditropan daily   Joint swelling    left knee   Neuropathy    takes Gabapentin daily   PONV (postoperative nausea and  vomiting)    RLS (restless legs syndrome)    Seasonal allergies    takes Claritin daioy as needed   Sleep apnea    pt. states that she does not have a CPAP   Tremor    Type 2 diabetes mellitus (HCC)    takes Metformin and Amaryl daily   Past Surgical History:  Procedure Laterality Date   ACHILLES TENDON REPAIR Right    APPENDECTOMY     BASAL CELL CARCINOMA EXCISION     Nose   BREAST LUMPECTOMY Left    CARDIAC CATHETERIZATION     pt. states approximately 15 years ago   CARDIOVERSION N/A 06/22/2018   Procedure: CARDIOVERSION;  Surgeon: Jodelle Red, MD;  Location: [facility] ENDOSCOPY;  Service: Cardiovascular;  Laterality: N/A;   CARPAL TUNNEL RELEASE Bilateral    DILATION AND CURETTAGE OF UTERUS     EYE SURGERY Bilateral    cataract surgery   KNEE ARTHROSCOPY     Right   LEFT HEART CATH AND CORONARY ANGIOGRAPHY N/A 02/01/2017   Procedure: Left Heart Cath and Coronary Angiography;  Surgeon: Runell Gess, MD;  Location: [facility] INVASIVE CV LAB;  Service: Cardiovascular;  Laterality: N/A;   LITHOTRIPSY  2013   TEE WITHOUT CARDIOVERSION N/A 06/22/2018   Procedure: TRANSESOPHAGEAL  ECHOCARDIOGRAM (TEE);  Surgeon: Jodelle Red, MD;  Location: [facility] ENDOSCOPY;  Service: Cardiovascular;  Laterality: N/A;   TOTAL ABDOMINAL HYSTERECTOMY     TOTAL KNEE ARTHROPLASTY Left 04/08/2015   Procedure: LEFT TOTAL KNEE ARTHROPLASTY;  Surgeon: Gean Birchwood, MD;  Location: [facility] OR;  Service: Orthopedics;  Laterality: Left;   Social History   Tobacco Use  Smoking Status Never   Passive exposure: Never  Smokeless Tobacco Never    Bonnye Fava 03/03/2023, 3:24 PM

## 2023-03-08 ENCOUNTER — Telehealth: Payer: Self-pay

## 2023-03-08 NOTE — Telephone Encounter (Signed)
Left detailed VM notifying of sleep study results and recommendations.

## 2023-03-08 NOTE — Telephone Encounter (Signed)
-----   Message from Lennette Bihari, MD sent at 02/26/2023  8:13 AM EDT ----- Coralee North, please notify the patient of the study results.

## 2023-03-11 NOTE — Progress Notes (Signed)
[facility] PREP Weekly Session  Patient Details  Name: Linda Washington MRN: [id number] Date of Birth: [birth year] Age: 72 y.o. PCP: Jarrett Soho, PA-C  There were no vitals filed for this visit.   [facility] Weekly seesion - 03/11/23 1200       [facility] "PREP" Location   [facility] "PREP" Location [facility]      Weekly Session   Topic Discussed Goal setting and welcome to the program   Fit testing   Classes attended to date 2             Bonnye Fava 03/11/2023, 12:19 PM

## 2023-03-12 ENCOUNTER — Telehealth: Payer: Self-pay | Admitting: *Deleted

## 2023-03-12 NOTE — Telephone Encounter (Signed)
The patient has been notified of the result and verbalized understanding.  All questions (if any) were answered. Latrelle Dodrill, CMA 03/12/2023 2:54 PM     Pt is agreeable to normal results.

## 2023-03-12 NOTE — Telephone Encounter (Signed)
-----   Message from Thomas A Kelly, MD sent at 02/26/2023  8:13 AM EDT ----- Nina, please notify the patient of the study results.  

## 2023-03-19 ENCOUNTER — Other Ambulatory Visit: Payer: Self-pay

## 2023-03-19 DIAGNOSIS — I5032 Chronic diastolic (congestive) heart failure: Secondary | ICD-10-CM

## 2023-03-19 MED ORDER — EMPAGLIFLOZIN 10 MG PO TABS: 10.0000 mg | ORAL_TABLET | Freq: Every day | ORAL | 3 refills | Status: DC

## 2023-03-22 ENCOUNTER — Other Ambulatory Visit: Payer: Medicare HMO

## 2023-03-23 ENCOUNTER — Ambulatory Visit
Admission: RE | Admit: 2023-03-23 | Discharge: 2023-03-23 | Disposition: A | Discharge status: Home / Self Care | Payer: Medicare HMO | Source: Ambulatory Visit | Attending: Pulmonary Disease | Admitting: Pulmonary Disease

## 2023-03-23 DIAGNOSIS — I517 Cardiomegaly: Secondary | ICD-10-CM | POA: Diagnosis not present

## 2023-03-23 DIAGNOSIS — R911 Solitary pulmonary nodule: Secondary | ICD-10-CM | POA: Diagnosis not present

## 2023-03-23 DIAGNOSIS — I700 Atherosclerosis of aorta: Secondary | ICD-10-CM | POA: Diagnosis not present

## 2023-03-23 DIAGNOSIS — E1142 Type 2 diabetes mellitus with diabetic polyneuropathy: Secondary | ICD-10-CM | POA: Diagnosis not present

## 2023-03-23 DIAGNOSIS — R0602 Shortness of breath: Secondary | ICD-10-CM

## 2023-03-23 DIAGNOSIS — R590 Localized enlarged lymph nodes: Secondary | ICD-10-CM | POA: Diagnosis not present

## 2023-03-23 NOTE — Progress Notes (Signed)
[facility] PREP Weekly Session  Patient Details  Name: Linda Washington MRN: [id number] Date of Birth: 1951/05/16 Age: 72 y.o. PCP: Jarrett Soho, PA-C  Vitals:   03/22/23 1430  Weight: 282 lb 3.2 oz (128 kg)     [facility] Weekly seesion - 03/23/23 1100       [facility] "PREP" Location   [facility] "PREP" Location [facility]      Weekly Session   Topic Discussed Importance of resistance training;Other ways to be active    Minutes exercised this week 60 minutes    Classes attended to date 3             Pam Jerral Bonito 03/23/2023, 11:45 AM

## 2023-03-24 ENCOUNTER — Telehealth: Payer: Self-pay | Admitting: Cardiology

## 2023-03-24 NOTE — Telephone Encounter (Signed)
  Pt is calling back to discuss again her sleep study result. She said, that will be the next step after her sleep study

## 2023-03-29 NOTE — Progress Notes (Signed)
[facility] PREP Weekly Session  Patient Details  Name: Linda Washington MRN: [id number] Date of Birth: [birth year] Age: 72 y.o. PCP: Jarrett Soho, PA-C  Vitals:   03/29/23 1430  Weight: 284 lb (128.8 kg)     [facility] Weekly seesion - 03/29/23 1500       [facility] "PREP" Location   [facility] "PREP" Location [facility]      Weekly Session   Topic Discussed Healthy eating tips    Minutes exercised this week 315 minutes    Classes attended to date 5             Bonnye Fava 03/29/2023, 3:57 PM

## 2023-03-30 NOTE — Telephone Encounter (Signed)
On this present study patient does not meet criteria for CPAP.   The patient has been notified of the result and verbalized understanding.  All questions (if any) were answered. Latrelle Dodrill, CMA 03/30/2023 2:26 PM

## 2023-03-31 ENCOUNTER — Telehealth: Payer: Self-pay | Admitting: Pulmonary Disease

## 2023-03-31 ENCOUNTER — Ambulatory Visit: Payer: Medicare HMO | Attending: Cardiology | Admitting: *Deleted

## 2023-03-31 DIAGNOSIS — I4892 Unspecified atrial flutter: Secondary | ICD-10-CM | POA: Diagnosis not present

## 2023-03-31 DIAGNOSIS — Z5181 Encounter for therapeutic drug level monitoring: Secondary | ICD-10-CM

## 2023-03-31 DIAGNOSIS — R911 Solitary pulmonary nodule: Secondary | ICD-10-CM

## 2023-03-31 LAB — POCT INR: INR: 1.9 — AB (ref 2.0–3.0)

## 2023-03-31 NOTE — Telephone Encounter (Signed)
PT sees results from her CT and wonders if Dr. Isaiah Serge wants to see her sooner.  Please call PT to advise @ [phone]

## 2023-03-31 NOTE — Telephone Encounter (Signed)
Patient has requested HRCT results.  Patient has seen CT report in my chart and wants to know if she should come in before 04/26/23. HRCT was 03/30/23 and images are in patients chart.   Message routed to Dr. Isaiah Serge to advise on results

## 2023-03-31 NOTE — Telephone Encounter (Signed)
I had reviewed her CT scan which shows a small consolidation which does not appear to be significant.  It looks like scarring versus infection.  If she has cough and sputum production then send in a Z-Pak.  Order follow-up non contrast CT in 6 months.  I do not believe she will need to earlier visit and can keep her current appointment.

## 2023-03-31 NOTE — Patient Instructions (Signed)
Description   Today take 2 tablets then START taking Warfarin 1.5 tablets daily except 1 tablet on  Thursdays. Keep leafy veggies consistent.  Recheck INR in 4 weeks.  Coumadin Clinic [phone].

## 2023-04-01 MED ORDER — AZITHROMYCIN 250 MG PO TABS: ORAL_TABLET | ORAL | 0 refills | Status: DC

## 2023-04-01 NOTE — Telephone Encounter (Signed)
Called and spoke with patient.  Dr. Shirlee More CT results and recommendations given. Understanding stated.Patient did request a Z Pak to [facility]. Z pak and CT  orders placed.  Nothing further at this time.

## 2023-04-05 NOTE — Progress Notes (Signed)
[facility] PREP Weekly Session  Patient Details  Name: Linda Washington MRN: [id number] Date of Birth: 03/02/1951 Age: 72 y.o. PCP: Jarrett Soho, PA-C  Vitals:   04/05/23 1519  Weight: 280 lb (127 kg)     [facility] Weekly seesion - 04/05/23 1500       [facility] "PREP" Location   [facility] "PREP" Location [facility]      Weekly Session   Topic Discussed Health habits    Minutes exercised this week 330 minutes    Classes attended to date 6             Pam Jerral Bonito 04/05/2023, 3:20 PM

## 2023-04-12 ENCOUNTER — Ambulatory Visit
Admission: RE | Admit: 2023-04-12 | Discharge: 2023-04-12 | Disposition: A | Discharge status: Home / Self Care | Payer: Medicare HMO | Source: Ambulatory Visit | Attending: Family Medicine | Admitting: Family Medicine

## 2023-04-12 DIAGNOSIS — N958 Other specified menopausal and perimenopausal disorders: Secondary | ICD-10-CM | POA: Diagnosis not present

## 2023-04-12 DIAGNOSIS — E349 Endocrine disorder, unspecified: Secondary | ICD-10-CM | POA: Diagnosis not present

## 2023-04-12 DIAGNOSIS — S2242XA Multiple fractures of ribs, left side, initial encounter for closed fracture: Secondary | ICD-10-CM

## 2023-04-12 DIAGNOSIS — M810 Age-related osteoporosis without current pathological fracture: Secondary | ICD-10-CM | POA: Diagnosis not present

## 2023-04-19 NOTE — Progress Notes (Signed)
[facility] PREP Weekly Session  Patient Details  Name: Linda Washington MRN: [id number] Date of Birth: November 30, 1950 Age: 72 y.o. PCP: Jarrett Soho, PA-C  Vitals:   04/19/23 1536  Weight: 279 lb (126.6 kg)     [facility] Weekly seesion - 04/19/23 1500       [facility] "PREP" Location   [facility] "PREP" Location [facility]      Weekly Session   Topic Discussed Stress management and problem solving   progressive relaxation meditation   Minutes exercised this week 225 minutes    Classes attended to date 10             Bonnye Fava 04/19/2023, 3:37 PM

## 2023-04-26 ENCOUNTER — Ambulatory Visit: Payer: Medicare HMO | Admitting: Pulmonary Disease

## 2023-04-26 ENCOUNTER — Encounter: Payer: Self-pay | Admitting: Pulmonary Disease

## 2023-04-26 ENCOUNTER — Ambulatory Visit (INDEPENDENT_AMBULATORY_CARE_PROVIDER_SITE_OTHER): Payer: Medicare HMO | Admitting: Podiatry

## 2023-04-26 ENCOUNTER — Encounter: Payer: Self-pay | Admitting: Podiatry

## 2023-04-26 VITALS — BP 112/58 | HR 56 | Temp 97.8°F | Ht 66.0 in | Wt 282.6 lb

## 2023-04-26 DIAGNOSIS — E119 Type 2 diabetes mellitus without complications: Secondary | ICD-10-CM

## 2023-04-26 DIAGNOSIS — R911 Solitary pulmonary nodule: Secondary | ICD-10-CM

## 2023-04-26 DIAGNOSIS — R0602 Shortness of breath: Secondary | ICD-10-CM

## 2023-04-26 DIAGNOSIS — M79675 Pain in left toe(s): Secondary | ICD-10-CM

## 2023-04-26 DIAGNOSIS — B351 Tinea unguium: Secondary | ICD-10-CM | POA: Diagnosis not present

## 2023-04-26 NOTE — Patient Instructions (Addendum)
I am glad you are doing well with her breathing Continue the Symbicort inhaler Will order a CT high-resolution in 6 months for follow-up of lung nodule, interstitial lung disease Return to clinic after CT scan in December

## 2023-04-26 NOTE — Progress Notes (Signed)
Linda Washington    [id number]    12/24/1950  Primary Care Physician:Wharton, Rulon Eisenmenger  Referring Physician: Jarrett Soho, PA-C [address] [place],  [place] [address]  Chief complaint: Follow-up for chronic cough  HPI: 72 y.o. who  has a past medical history of Acute on chronic diastolic (congestive) heart failure (HCC) (06/2018), Anxiety, Arthritis, Basal cell carcinoma, GERD (gastroesophageal reflux disease), History of Bell's palsy, History of bronchitis, History of hiatal hernia, History of kidney stones, Hyperlipemia, Hypertension, Hypothyroidism, Incontinence, Joint swelling, Neuropathy, PONV (postoperative nausea and vomiting), RLS (restless legs syndrome), Seasonal allergies, Sleep apnea, Tremor, and Type 2 diabetes mellitus (HCC).   Here for evaluation of cough with occasional green mucus, dyspnea.  Denies any wheeze, fevers, chills.  Symptoms started after she was diagnosed and treated with pneumonia as an outpatient with doxycycline in October 2023.  Has history of seasonal allergies, OSA noncompliant with CPAP.  History notable for a fall while on Coumadin in 2022 with left rib fractures and hemothorax which was treated conservatively.   Pets: Cats Occupation: Retired Psychologist, clinical Exposures: No mold, hot tub, Jacuzzi.  No feather pillows or comforters Smoking history: Never smoker Travel history: Previously lived in [place] and [place] Relevant family history: No significant family history of lung disease  Interim history: She is here for review of CT results. At last visit she was treated with Symbicort inhaler and prednisone taper.  States that it has improved her breathing  Outpatient Encounter Medications as of 04/26/2023  Medication Sig   acetaminophen (TYLENOL) 650 MG CR tablet Take 650 mg by mouth every 8 (eight) hours as needed for pain.   atorvastatin (LIPITOR) 40 MG tablet Take 1 tablet (40 mg total) by mouth at bedtime.    budesonide-formoterol (SYMBICORT) 160-4.5 MCG/ACT inhaler Inhale 2 puffs into the lungs in the morning and at bedtime.   carvedilol (COREG) 3.125 MG tablet TAKE 1 TABLET TWICE DAILY WITH MEALS   citalopram (CELEXA) 20 MG tablet Take 20 mg by mouth daily.   Cyanocobalamin (VITAMIN B 12 PO) Take 1,000 mg by mouth daily.   empagliflozin (JARDIANCE) 10 MG TABS tablet Take 1 tablet (10 mg total) by mouth daily before breakfast.   fluticasone (FLONASE) 50 MCG/ACT nasal spray Place 1-2 sprays into both nostrils daily as needed for allergies or rhinitis.   furosemide (LASIX) 40 MG tablet Take 1 tablet (40 mg total) by mouth daily.   metFORMIN (GLUCOPHAGE) 1000 MG tablet Take 1,000 mg by mouth 2 (two) times daily with a meal.   NOVOLOG FLEXPEN 100 UNIT/ML FlexPen Inject 4 Units into the skin daily as needed. If Blood Sugar>200=Take 4 units   omeprazole (PRILOSEC) 40 MG capsule Take 40 mg by mouth daily.   oxybutynin (DITROPAN XL) 15 MG 24 hr tablet Take 15 mg by mouth daily.   Polyethyl Glycol-Propyl Glycol (SYSTANE OP) Place 1-2 drops into both eyes daily as needed (dry eyes).   TRULICITY 0.75 MG/0.5ML SOPN Inject 0.75 mg into the skin once a week. Take on Wed   warfarin (COUMADIN) 5 MG tablet TAKE AS DIRECTED BY COUMADIN CLINIC   [DISCONTINUED] azithromycin (ZITHROMAX) 250 MG tablet Take as directed   Facility-Administered Encounter Medications as of 04/26/2023  Medication   betamethasone acetate-betamethasone sodium phosphate (CELESTONE) injection 3 mg   Physical Exam: Blood pressure (!) 112/58, pulse (!) 56, temperature 97.8 F (36.6 C), temperature source Oral, height 5\' 6"  (1.676 m), weight 282 lb 9.6  oz (128.2 kg), SpO2 94%. Gen:      No acute distress HEENT:  EOMI, sclera anicteric Neck:     No masses; no thyromegaly Lungs:    Clear to auscultation bilaterally; normal respiratory effort CV:         Regular rate and rhythm; no murmurs Abd:      + bowel sounds; soft, non-tender; no palpable  masses, no distension Ext:    No edema; adequate peripheral perfusion Skin:      Warm and dry; no rash Neuro: alert and oriented x 3 Psych: normal mood and affect   Data Reviewed: Imaging: CT chest 09/03/2021-left rib fractures, moderate left hemithorax with consolidative changes in the left lower lobe.  Chest x-ray 12/08/2022-no active cardiopulmonary disease.  CT high-resolution 03/23/2023-nodular focus of consolidation in the left lower lobe, mild patchy air trapping.  No evidence of interstitial lung disease. I have reviewed the images personally.  PFTs:  Labs: CBC differential 12/14/2022- WBC 9.2 eos 2.7%, absolute eosinophil count 248 IgE 12/14/2022- 66  Assessment:  Chronic bronchitis.   She is a non-smoker and is low suspicion for emphysema.  She may have reactive airway disease/asthma given history of seasonal allergies.  Doing better on Symbicort CT reviewed with no evidence of interstitial lung disease.  Does show a small area of consolidation/scarring in the left lower lobe at the area for broken rib.  Will continue to monitor this and order a follow-up high-res CT in 6 months  Plan/Recommendations: Symbicort with spacer High-res CT in 6 months.  Chilton Greathouse MD [facility] 04/26/2023, 9:49 AM  CC: Jarrett Soho, PA-C

## 2023-04-26 NOTE — Progress Notes (Signed)
This patient returns to my office for at risk foot care.  This patient requires this care by a professional since this patient will be at risk due to having diabetes mellitus.and coagulation defect.   This patient is unable to cut nails herself since the patient cannot reach her nails.These nails are painful walking and wearing shoes.  This patient presents for at risk foot care today. ? ?General Appearance  Alert, conversant and in no acute stress. ? ?Vascular  Dorsalis pedis and posterior tibial  pulses are weakly  palpable  bilaterally.  Capillary return is within normal limits  bilaterally. Temperature is within normal limits  bilaterally. ? ?Neurologic  Senn-Weinstein monofilament wire test diminished   bilaterally. Muscle power within normal limits bilaterally. ? ?Nails Thick disfigured discolored nails with subungual debris  Hallux nails. No evidence of bacterial infection or drainage bilaterally. ? ?Orthopedic  No limitations of motion  feet .  No crepitus or effusions noted.  HAV  B/L.  Hammer toes  B/L.  Haglunds deformity right foot.  Plantar flexed fifth metatarsal  B/L. ? ?Skin  normotropic skin  noted bilaterally.  No signs of infections or ulcers noted.   Porokeratosis sub 5th met  B/L asymptomatic. ? ?Onychomycosis  Pain in right toes  Pain in left toes   ? ?Consent was obtained for treatment procedures.   Mechanical debridement of nails 1-5  bilaterally performed with a nail nipper.  Filed with dremel without incident. ? ? ?Return office visit    3 months                  Told patient to return for periodic foot care and evaluation due to potential at risk complications. ? ? ?Donja Tipping DPM  ?

## 2023-04-27 NOTE — Progress Notes (Signed)
[facility] PREP Weekly Session  Patient Details  Name: Linda Washington MRN: [id number] Date of Birth: 03-15-1951 Age: 72 y.o. PCP: Jarrett Soho, PA-C  Vitals:   04/26/23 1430  Weight: 279 lb (126.6 kg)     [facility] Weekly seesion - 04/27/23 1000       [facility] "PREP" Location   [facility] "PREP" Location [facility]      Weekly Session   Topic Discussed Expectations and non-scale victories    Minutes exercised this week 350 minutes    Classes attended to date 2             Pam Jerral Bonito 04/27/2023, 10:11 AM

## 2023-04-28 ENCOUNTER — Ambulatory Visit: Payer: Medicare HMO

## 2023-05-04 ENCOUNTER — Ambulatory Visit: Payer: Medicare HMO | Attending: Cardiology | Admitting: *Deleted

## 2023-05-04 DIAGNOSIS — I4892 Unspecified atrial flutter: Secondary | ICD-10-CM

## 2023-05-04 DIAGNOSIS — Z5181 Encounter for therapeutic drug level monitoring: Secondary | ICD-10-CM

## 2023-05-04 LAB — POCT INR: INR: 3.2 — AB (ref 2.0–3.0)

## 2023-05-04 NOTE — Progress Notes (Signed)
[facility] PREP Weekly Session  Patient Details  Name: Linda Washington MRN: [id number] Date of Birth: [birth year] Age: 72 y.o. PCP: Jarrett Soho, PA-C  Vitals:   05/03/23 1430  Weight: 279 lb (126.6 kg)     [facility] Weekly seesion - 05/04/23 0900       [facility] "PREP" Location   [facility] "PREP" [facility]      Weekly Session   Topic Discussed --   Portions   Minutes exercised this week 225 minutes    Classes attended to date 13             Bonnye Fava 05/04/2023, 9:58 AM

## 2023-05-04 NOTE — Patient Instructions (Addendum)
Description   Today take 1 tablet of warfarin then CONTINUE taking Warfarin 1.5 tablets daily except 1 tablet on  Thursdays. Keep leafy veggies consistent.  Recheck INR in 4 weeks. Coumadin Clinic [phone].

## 2023-05-13 NOTE — Progress Notes (Signed)
[facility] PREP Weekly Session  Patient Details  Name: Linda Washington MRN: [id number] Date of Birth: Dec 22, 1950 Age: 72 y.o. PCP: Jarrett Soho, PA-C  Vitals:   05/10/23 1430  Weight: 277 lb 9.6 oz (125.9 kg)     [facility] Weekly seesion - 05/13/23 1700       [facility] "PREP" Location   [facility] "PREP" Location [facility]      Weekly Session   Topic Discussed Finding support    Minutes exercised this week 277.6 minutes    Classes attended to date 14             Bonnye Fava 05/13/2023, 5:12 PM

## 2023-05-16 ENCOUNTER — Other Ambulatory Visit: Payer: Self-pay

## 2023-05-16 ENCOUNTER — Emergency Department (HOSPITAL_COMMUNITY)
Admission: EM | Admit: 2023-05-16 | Discharge: 2023-05-16 | Disposition: A | Discharge status: Home / Self Care | Payer: Medicare HMO | Attending: Emergency Medicine | Admitting: Emergency Medicine

## 2023-05-16 ENCOUNTER — Encounter (HOSPITAL_COMMUNITY): Payer: Self-pay

## 2023-05-16 DIAGNOSIS — R0600 Dyspnea, unspecified: Secondary | ICD-10-CM | POA: Diagnosis present

## 2023-05-16 DIAGNOSIS — Z91040 Latex allergy status: Secondary | ICD-10-CM | POA: Diagnosis not present

## 2023-05-16 DIAGNOSIS — Z7901 Long term (current) use of anticoagulants: Secondary | ICD-10-CM | POA: Insufficient documentation

## 2023-05-16 DIAGNOSIS — R001 Bradycardia, unspecified: Secondary | ICD-10-CM | POA: Diagnosis not present

## 2023-05-16 DIAGNOSIS — Z794 Long term (current) use of insulin: Secondary | ICD-10-CM | POA: Diagnosis not present

## 2023-05-16 LAB — BASIC METABOLIC PANEL
Anion gap: 15 (ref 5–15)
BUN: 23 mg/dL (ref 8–23)
CO2: 19 mmol/L — ABNORMAL LOW (ref 22–32)
Calcium: 9 mg/dL (ref 8.9–10.3)
Chloride: 105 mmol/L (ref 98–111)
Creatinine, Ser: 0.97 mg/dL (ref 0.44–1.00)
GFR, Estimated: >60 mL/min (ref >60)
Glucose, Bld: 162 mg/dL — ABNORMAL HIGH (ref 70–99)
Potassium: 3.3 mmol/L — ABNORMAL LOW (ref 3.5–5.1)
Sodium: 139 mmol/L (ref 135–145)

## 2023-05-16 LAB — CBC
HCT: 37.5 % (ref 36.0–46.0)
Hemoglobin: 11.7 g/dL — ABNORMAL LOW (ref 12.0–15.0)
MCH: 28.5 pg (ref 26.0–34.0)
MCHC: 31.2 g/dL (ref 30.0–36.0)
MCV: 91.5 fL (ref 80.0–100.0)
Platelets: 321 10*3/uL (ref 150–400)
RBC: 4.1 MIL/uL (ref 3.87–5.11)
RDW: 15.7 % — ABNORMAL HIGH (ref 11.5–15.5)
WBC: 8.6 10*3/uL (ref 4.0–10.5)
nRBC: 0 % (ref 0.0–0.2)

## 2023-05-16 LAB — PROTIME-INR
INR: 3.1 — ABNORMAL HIGH (ref 0.8–1.2)
Prothrombin Time: 32.2 seconds — ABNORMAL HIGH (ref 11.4–15.2)

## 2023-05-16 LAB — TROPONIN I (HIGH SENSITIVITY)
Troponin I (High Sensitivity): 26 ng/L — ABNORMAL HIGH (ref <18)
Troponin I (High Sensitivity): 24 ng/L — ABNORMAL HIGH (ref <18)

## 2023-05-16 MED ORDER — POTASSIUM CHLORIDE 20 MEQ PO PACK
40.0000 meq | PACK | Freq: Every day | ORAL | Status: DC
  Administered 2023-05-16: 40 meq via ORAL
  Filled 2023-05-16: qty 2

## 2023-05-16 MED ORDER — SODIUM CHLORIDE 0.9 % IV BOLUS
1000.0000 mL | Freq: Once | INTRAVENOUS | Status: AC
  Administered 2023-05-16: 1000 mL via INTRAVENOUS

## 2023-05-16 NOTE — ED Provider Notes (Addendum)
  Provider Note MRN:  [id number]  Arrival date & time: 05/17/23    ED Course and Medical Decision Making  Assumed care from Dr Rosalia Hammers at shift change.  See note from prior team for complete details, in brief:  72 yo f Hx afib on coreg HR at home 30's 40's on arrival Her normal around 60's +weakness/fatigue recently No CP Dr Rosalia Hammers spoke with Dr Anne Fu > hold coreg F/u afib clinic tomorrow   Plan per prior physician follow up trop   Clinical Course as of 05/17/23 2334  Sun May 16, 2023  1358 Troponin I (High Sensitivity)(!): 26 [DR]  1539 Feeling better on recheck  [SG]  1631 HR has dropped a bit as well, will give fluids and recheck, trop is downtrending [SG]  1725 Attempted to ambulate the pt, HR 19-23; will re-connect with cardiology [SG]  1815 HR now 62 [SG]    Clinical Course User Index [DR] Margarita Grizzle, MD [SG] Tanda Rockers A, DO   Pt here w/ bradycardia Medication adjusted by cardiology HR greatly improved  Feeling better Tolerating PO F/u with cardiology/afib clinic   The patient improved significantly and was discharged in stable condition. Detailed discussions were had with the patient regarding current findings, and need for close f/u with PCP or on call doctor. The patient has been instructed to return immediately if the symptoms worsen in any way for re-evaluation. Patient verbalized understanding and is in agreement with current care plan. All questions answered prior to discharge.    Procedures  Final Clinical Impressions(s) / ED Diagnoses     ICD-10-CM   1. Bradycardia  R00.1       ED Discharge Orders          Ordered    Amb Referral to AFIB Clinic        05/16/23 1545              Discharge Instructions      Please stop your carvedilol. Please call the A-fib clinic tomorrow for recheck Return to the emergency department if you are having chest pain, passing out, or other symptoms of lowering heart rate       Sloan Leiter,  DO 05/16/23 1546    Sloan Leiter, DO 05/17/23 2334

## 2023-05-16 NOTE — ED Notes (Signed)
Pt heart rate went down to 22 on the monitor with multifocal PVCs. Pt has cardiac pads placed on the patient and MD made aware.

## 2023-05-16 NOTE — ED Provider Notes (Signed)
[place] EMERGENCY DEPARTMENT AT [facility] Provider Note   CSN: [id number] Arrival date & time: 05/16/23  1134     History {Add pertinent medical, surgical, social history, OB history to HPI:1} Chief Complaint  Patient presents with   Bradycardia   Weakness   SOB    Linda Washington is a 72 y.o. female.  HPI 72 year old female history of a flutter, on Coumadin and carvedilol for rate control.  She has noticed increased weakness and some dyspnea.  She reports that she checked her heart rate at home and it was 31-40.  She states her normal heart rate is in the 60s.  She feels this is secondary to low heart rate.  She has had some associated dyspnea.  She denies headache, head injury, chest pain, fever, chills, cough.     Home Medications Prior to Admission medications   Medication Sig Start Date End Date Taking? Authorizing Provider  acetaminophen (TYLENOL) 650 MG CR tablet Take 650 mg by mouth every 8 (eight) hours as needed for pain.    [provider]  atorvastatin (LIPITOR) 40 MG tablet Take 1 tablet (40 mg total) by mouth at bedtime. 01/15/23   Jake Bathe, MD  budesonide-formoterol (SYMBICORT) 160-4.5 MCG/ACT inhaler Inhale 2 puffs into the lungs in the morning and at bedtime. 12/14/22   Mannam, Colbert Coyer, MD  carvedilol (COREG) 3.125 MG tablet TAKE 1 TABLET TWICE DAILY WITH MEALS 05/11/22   Jake Bathe, MD  citalopram (CELEXA) 20 MG tablet Take 20 mg by mouth daily.    [provider]  Cyanocobalamin (VITAMIN B 12 PO) Take 1,000 mg by mouth daily.    [provider]  empagliflozin (JARDIANCE) 10 MG TABS tablet Take 1 tablet (10 mg total) by mouth daily before breakfast. 03/19/23   Jake Bathe, MD  fluticasone (FLONASE) 50 MCG/ACT nasal spray Place 1-2 sprays into both nostrils daily as needed for allergies or rhinitis.    [provider]  furosemide (LASIX) 40 MG tablet Take 1 tablet (40 mg total) by mouth daily. 05/11/22    Jake Bathe, MD  metFORMIN (GLUCOPHAGE) 1000 MG tablet Take 1,000 mg by mouth 2 (two) times daily with a meal.    [provider]  NOVOLOG FLEXPEN 100 UNIT/ML FlexPen Inject 4 Units into the skin daily as needed. If Blood Sugar>200=Take 4 units 05/26/21   [provider]  omeprazole (PRILOSEC) 40 MG capsule Take 40 mg by mouth daily.    [provider]  oxybutynin (DITROPAN XL) 15 MG 24 hr tablet Take 15 mg by mouth daily.    [provider]  Polyethyl Glycol-Propyl Glycol (SYSTANE OP) Place 1-2 drops into both eyes daily as needed (dry eyes).    [provider]  TRULICITY 0.75 MG/0.5ML SOPN Inject 0.75 mg into the skin once a week. Take on Wed 05/29/20   [provider]  warfarin (COUMADIN) 5 MG tablet TAKE AS DIRECTED BY COUMADIN CLINIC 10/05/22   Pricilla Riffle, MD      Allergies    Cyclobenzaprine, Gabapentin, Hydrocodone, Methocarbamol, Oxycodone, Tinidazole, Latex, and Other    Review of Systems   Review of Systems  Physical Exam Updated Vital Signs BP (!) 112/46   Pulse 77   Temp 98 F (36.7 C)   Resp (!) 22   SpO2 97%  Physical Exam Vitals and nursing note reviewed.  Constitutional:      General: She is not in acute distress. HENT:  Head: Normocephalic.     Right Ear: External ear normal.     Left Ear: External ear normal.     Nose: Nose normal.     Mouth/Throat:     Pharynx: Oropharynx is clear.  Eyes:     Pupils: Pupils are equal, round, and reactive to light.  Cardiovascular:     Rate and Rhythm: Bradycardia present. Rhythm irregular.     Pulses: Normal pulses.  Pulmonary:     Effort: Pulmonary effort is normal.     Breath sounds: Normal breath sounds.  Abdominal:     General: Bowel sounds are normal. There is no distension.     Tenderness: There is no abdominal tenderness.  Musculoskeletal:        General: Normal range of motion.     Cervical back: Normal range of motion.  Skin:    General: Skin is  warm and dry.     Capillary Refill: Capillary refill takes less than 2 seconds.  Neurological:     General: No focal deficit present.     Mental Status: She is alert.  Psychiatric:        Mood and Affect: Mood normal.     ED Results / Procedures / Treatments   Labs (all labs ordered are listed, but only abnormal results are displayed) Labs Reviewed  PROTIME-INR  BASIC METABOLIC PANEL  CBC  TROPONIN I (HIGH SENSITIVITY)    EKG EKG Interpretation Date/Time:  Sunday May 16 2023 11:52:43 EDT Ventricular Rate:  47 PR Interval:    QRS Duration:  94 QT Interval:  484 QTC Calculation: 428 R Axis:   -25  Text Interpretation: Atrial fibrillation with slow ventricular response Nonspecific ST and T wave abnormality Abnormal ECG When compared with ECG of 22-Jun-2018 11:51, PREVIOUS ECG IS PRESENT Confirmed by Margarita Grizzle [phone]) on 05/16/2023 12:22:15 PM  Radiology No results found.  Procedures Procedures  {Document cardiac monitor, telemetry assessment procedure when appropriate:1}  Medications Ordered in ED Medications - No data to display  ED Course/ Medical Decision Making/ A&P Clinical Course as of 05/16/23 1359  Sun May 16, 2023  1358 Troponin I (High Sensitivity)(!): 26 [DR]    Clinical Course User Index [DR] Margarita Grizzle, MD   {   Click here for ABCD2, HEART and other calculatorsREFRESH Note before signing :1}                          Medical Decision Making Amount and/or Complexity of Data Reviewed Labs: ordered. Decision-making details documented in ED Course.  Risk Prescription drug management.   72 year old female history of atrial flutter on beta-blocker for rate control, history of diabetes, on Coumadin, presents today with generalized weakness and some sedation and dyspnea.  She had noticed low heart rate at home.  EKG with A-fib with slow rate of approximately 40. Differential diagnosis includes but is not limited to bradycardia, anemia, electrolyte  abnormality, hypoglycemia, Patient being evaluated here with labs and is on monitor.  Patient with mild hypokalemia will orally replete Discussed care with Dr. Anne Fu.  Patient will have carvedilol stopped and will be reevaluated in A-fib clinic. She is not having any chest pain.  However, troponin was obtained and is mildly elevated will await repeat troponin. {Document critical care time when appropriate:1} {Document review of labs and clinical decision tools ie heart score, Chads2Vasc2 etc:1}  {Document your independent review of radiology images, and any outside records:1} {Document your discussion with family  members, caretakers, and with consultants:1} {Document social determinants of health affecting pt's care:1} {Document your decision making why or why not admission, treatments were needed:1} Final Clinical Impression(s) / ED Diagnoses Final diagnoses:  None    Rx / DC Orders ED Discharge Orders     None

## 2023-05-16 NOTE — Discharge Instructions (Addendum)
Please stop your carvedilol. Please call the A-fib clinic tomorrow for recheck Return to the emergency department if you are having chest pain, passing out, or other symptoms of lowering heart rate

## 2023-05-16 NOTE — ED Provider Notes (Incomplete Revision)
  Provider Note MRN:  [id number]  Arrival date & time: 05/16/23    ED Course and Medical Decision Making  Assumed care from Dr Rosalia Hammers at shift change.  See note from prior team for complete details, in brief:  72 yo f Hx afib on coreg HR at home 30's 40's on arrival Her normal around 60's +weakness/fatigue recently No CP Dr Rosalia Hammers spoke with Dr Anne Fu > hold coreg F/u afib clinic tomorrow   Plan per prior physician follow up trop   Clinical Course as of 05/16/23 1632  Sun May 16, 2023  1358 Troponin I (High Sensitivity)(!): 26 [DR]  1539 Feeling better on recheck  [SG]  1631 HR has dropped a bit as well, will give fluids and recheck, trop is downtrending [SG]    Clinical Course User Index [DR] Margarita Grizzle, MD [SG] Tanda Rockers A, DO   Pt here w/ bradycardia Improved Medication adjusted by cardiology F/u with cardiology/afib clinic   The patient improved significantly and was discharged in stable condition. Detailed discussions were had with the patient regarding current findings, and need for close f/u with PCP or on call doctor. The patient has been instructed to return immediately if the symptoms worsen in any way for re-evaluation. Patient verbalized understanding and is in agreement with current care plan. All questions answered prior to discharge.    Procedures  Final Clinical Impressions(s) / ED Diagnoses     ICD-10-CM   1. Bradycardia  R00.1       ED Discharge Orders          Ordered    Amb Referral to AFIB Clinic        05/16/23 1545              Discharge Instructions      Please stop your carvedilol. Please call the A-fib clinic tomorrow for recheck Return to the emergency department if you are having chest pain, passing out, or other symptoms of lowering heart rate       Sloan Leiter, DO 05/16/23 1546

## 2023-05-16 NOTE — ED Triage Notes (Signed)
Pt came in via POV d/t "not feeling well" the last few days. Reports feeling weak, dizzy, SOB, nauseated & her pulse was 31 bpm this morning. Her pulse ranged from 27-41 bpm while in triage. A/Ox4. Denies acute pain.

## 2023-05-17 ENCOUNTER — Telehealth: Payer: Self-pay | Admitting: Cardiology

## 2023-05-17 ENCOUNTER — Telehealth: Payer: Self-pay

## 2023-05-17 DIAGNOSIS — E876 Hypokalemia: Secondary | ICD-10-CM

## 2023-05-17 DIAGNOSIS — R001 Bradycardia, unspecified: Secondary | ICD-10-CM | POA: Diagnosis not present

## 2023-05-17 NOTE — Telephone Encounter (Signed)
Late entry. Call from patient this am to report that she had been in ED for bradycardia. HR in the 20's and 30's had some shortness of breath also.  Did change some of her meds and got f/u with afib clinic next week. Encouraged pt to ask to be put on a cancellation list so she could get in earlier. Pt commented she had f/u with PCP this week. Message sent to referring provider. Will hold PREP til we have more information. Pt was tolerating exercise without issues.

## 2023-05-17 NOTE — Telephone Encounter (Signed)
Spoke with the patient who states that she was in the ER and they had her hold her carvedilol due to bradycardia. She states heart rate has been around 56. Advised to continue to hold until appointment with Afib clinic.

## 2023-05-17 NOTE — Telephone Encounter (Signed)
Patient returned call to office, transferred from call center.   Heart rate returned to the 50-60s. Patient states she hasn't any presyncopal or syncopal feelings even when heart rate down in the 30s. Patient has appointment Wednesday with PCP and will get labs drawn then!

## 2023-05-17 NOTE — Telephone Encounter (Signed)
Pt c/o medication issue:  1. Name of Medication: carvedilol (COREG) 3.125 MG tablet   2. How are you currently taking this medication (dosage and times per day)? Not currently taking  3. Are you having a reaction (difficulty breathing--STAT)? No   4. What is your medication issue? Patient states her HR today off the carvedilol is 56. She contacted the A-fib clinic and has been scheduled for 08/05. She is wanting to know if it is okay to be off of this medication for that long. Please advise.

## 2023-05-17 NOTE — Telephone Encounter (Signed)
Received note from PREP team inquiring if sooner visit needed than 05/24/23.  Seen in ED yesterday bradycardic, Coreg discontinued, K 3.3 which was repleted with KDur .   Will ask nursing team to call Okay to wait until 05/24/23 for scheduled Afib appointment so long as HR >45 bpm If HR persistently <45 bpm, recommend contacting the office Okay to stay off Carvedilol  Her potassium was repleted in the ED which may have been contributory to low heart rate. Recommend repeat BMP, magnesium Wednesday or Thursday for monitoring.  If noting near syncope or syncope since ED visit recommend mailing ZIO-AT. If no symptoms since ED visit, can address at upcoming AFib clinic visit.   Alver Sorrow, NP

## 2023-05-17 NOTE — Telephone Encounter (Signed)
Left message for patient to call back     Will ask nursing team to call  Okay to wait until 05/24/23 for scheduled Afib appointment so long as HR >45 bpm  If HR persistently <45 bpm, recommend contacting the office  Okay to stay off Carvedilol   Her potassium was repleted in the ED which may have been contributory to low heart rate. Recommend repeat BMP, magnesium Wednesday or Thursday for monitoring.   If noting near syncope or syncope since ED visit recommend mailing ZIO-AT. If no symptoms since ED visit, can address at upcoming AFib clinic visit.    Alver Sorrow, NP

## 2023-05-17 NOTE — Addendum Note (Signed)
Addended by: Marlene Lard on: 05/17/2023 01:53 PM   Modules accepted: Orders

## 2023-05-19 DIAGNOSIS — R001 Bradycardia, unspecified: Secondary | ICD-10-CM | POA: Diagnosis not present

## 2023-05-19 DIAGNOSIS — R350 Frequency of micturition: Secondary | ICD-10-CM | POA: Diagnosis not present

## 2023-05-19 DIAGNOSIS — I5032 Chronic diastolic (congestive) heart failure: Secondary | ICD-10-CM | POA: Diagnosis not present

## 2023-05-19 DIAGNOSIS — E538 Deficiency of other specified B group vitamins: Secondary | ICD-10-CM | POA: Diagnosis not present

## 2023-05-19 DIAGNOSIS — R799 Abnormal finding of blood chemistry, unspecified: Secondary | ICD-10-CM | POA: Diagnosis not present

## 2023-05-19 DIAGNOSIS — E1142 Type 2 diabetes mellitus with diabetic polyneuropathy: Secondary | ICD-10-CM | POA: Diagnosis not present

## 2023-05-19 DIAGNOSIS — Z6841 Body Mass Index (BMI) 40.0 and over, adult: Secondary | ICD-10-CM | POA: Diagnosis not present

## 2023-05-19 DIAGNOSIS — I4892 Unspecified atrial flutter: Secondary | ICD-10-CM | POA: Diagnosis not present

## 2023-05-20 DIAGNOSIS — E1142 Type 2 diabetes mellitus with diabetic polyneuropathy: Secondary | ICD-10-CM | POA: Diagnosis not present

## 2023-05-24 ENCOUNTER — Inpatient Hospital Stay (HOSPITAL_COMMUNITY): Admission: RE | Admit: 2023-05-24 | Payer: Medicare HMO | Source: Ambulatory Visit

## 2023-05-24 ENCOUNTER — Encounter (HOSPITAL_COMMUNITY): Payer: Self-pay | Admitting: Internal Medicine

## 2023-05-24 ENCOUNTER — Ambulatory Visit (HOSPITAL_COMMUNITY)
Admission: RE | Admit: 2023-05-24 | Discharge: 2023-05-24 | Disposition: A | Discharge status: Home / Self Care | Payer: Medicare HMO | Source: Ambulatory Visit | Attending: Internal Medicine | Admitting: Internal Medicine

## 2023-05-24 VITALS — BP 128/72 | HR 52 | Ht 66.0 in | Wt 279.8 lb

## 2023-05-24 DIAGNOSIS — E785 Hyperlipidemia, unspecified: Secondary | ICD-10-CM | POA: Diagnosis not present

## 2023-05-24 DIAGNOSIS — G4733 Obstructive sleep apnea (adult) (pediatric): Secondary | ICD-10-CM | POA: Insufficient documentation

## 2023-05-24 DIAGNOSIS — I4819 Other persistent atrial fibrillation: Secondary | ICD-10-CM | POA: Insufficient documentation

## 2023-05-24 DIAGNOSIS — I110 Hypertensive heart disease with heart failure: Secondary | ICD-10-CM | POA: Insufficient documentation

## 2023-05-24 DIAGNOSIS — Z7985 Long-term (current) use of injectable non-insulin antidiabetic drugs: Secondary | ICD-10-CM | POA: Diagnosis not present

## 2023-05-24 DIAGNOSIS — Z794 Long term (current) use of insulin: Secondary | ICD-10-CM | POA: Diagnosis not present

## 2023-05-24 DIAGNOSIS — D6869 Other thrombophilia: Secondary | ICD-10-CM | POA: Insufficient documentation

## 2023-05-24 DIAGNOSIS — R9431 Abnormal electrocardiogram [ECG] [EKG]: Secondary | ICD-10-CM | POA: Diagnosis not present

## 2023-05-24 DIAGNOSIS — R001 Bradycardia, unspecified: Secondary | ICD-10-CM

## 2023-05-24 DIAGNOSIS — I083 Combined rheumatic disorders of mitral, aortic and tricuspid valves: Secondary | ICD-10-CM | POA: Insufficient documentation

## 2023-05-24 DIAGNOSIS — I4892 Unspecified atrial flutter: Secondary | ICD-10-CM | POA: Insufficient documentation

## 2023-05-24 DIAGNOSIS — E119 Type 2 diabetes mellitus without complications: Secondary | ICD-10-CM | POA: Insufficient documentation

## 2023-05-24 DIAGNOSIS — I5032 Chronic diastolic (congestive) heart failure: Secondary | ICD-10-CM | POA: Diagnosis not present

## 2023-05-24 DIAGNOSIS — E669 Obesity, unspecified: Secondary | ICD-10-CM | POA: Insufficient documentation

## 2023-05-24 DIAGNOSIS — Z7901 Long term (current) use of anticoagulants: Secondary | ICD-10-CM | POA: Diagnosis not present

## 2023-05-24 DIAGNOSIS — Z7984 Long term (current) use of oral hypoglycemic drugs: Secondary | ICD-10-CM | POA: Insufficient documentation

## 2023-05-24 NOTE — Progress Notes (Signed)
Primary Care Physician: Jarrett Soho, PA-C Primary Cardiologist: Donato Schultz, MD Electrophysiologist: None     Referring Physician: ED     Linda Washington is a 72 y.o. female with a history of HTN, T2DM, OSA, chronic diastolic heart failure, HLD, obesity, and rate-controlled atrial flutter who presents for consultation in the [facility]. Recently seen in ED on 7/28 for weakness, bradycardia, and SOB; patient noting at home HR 30-40s normally in the 60s. Heart rates noted in the ED as low as 19-23; she did not require emergent procedure and was discharged home. Carvedilol held per cardiology. Patient is on warfarin for a CHADS2VASC score of 4.  On evaluation today, she is currently in atrial flutter. Patient states atrial flutter is rate controlled. Review of records show last ECG in [facility] in 2019. She is unsure of why her HR went suddenly so low on that day. She has a log of HR since that day with HR variability from 30-60s depending on the day.  Today, she denies symptoms of palpitations, chest pain, shortness of breath, orthopnea, PND, lower extremity edema, dizziness, presyncope, syncope, snoring, daytime somnolence, bleeding, or neurologic sequela. The patient is tolerating medications without difficulties and is otherwise without complaint today.    she has a BMI of Body mass index is 45.16 kg/m.Marland Kitchen Filed Weights   05/24/23 1452  Weight: 126.9 kg    Current Outpatient Medications  Medication Sig Dispense Refill   acetaminophen (TYLENOL) 650 MG CR tablet Take 650 mg by mouth every 8 (eight) hours as needed for pain.     atorvastatin (LIPITOR) 40 MG tablet Take 1 tablet (40 mg total) by mouth at bedtime. 90 tablet 3   budesonide-formoterol (SYMBICORT) 160-4.5 MCG/ACT inhaler Inhale 2 puffs into the lungs in the morning and at bedtime. 10.2 g 11   citalopram (CELEXA) 20 MG tablet Take 20 mg by mouth daily.     Cyanocobalamin (VITAMIN B 12 PO) Take  1,000 mg by mouth daily.     empagliflozin (JARDIANCE) 10 MG TABS tablet Take 1 tablet (10 mg total) by mouth daily before breakfast. 90 tablet 3   fluticasone (FLONASE) 50 MCG/ACT nasal spray Place 1-2 sprays into both nostrils daily as needed for allergies or rhinitis.     furosemide (LASIX) 40 MG tablet Take 1 tablet (40 mg total) by mouth daily. 90 tablet 3   metFORMIN (GLUCOPHAGE) 1000 MG tablet Take 1,000 mg by mouth 2 (two) times daily with a meal.     NOVOLOG FLEXPEN 100 UNIT/ML FlexPen Inject 4 Units into the skin daily as needed. If Blood Sugar>200=Take 4 units     omeprazole (PRILOSEC) 40 MG capsule Take 40 mg by mouth daily.     oxybutynin (DITROPAN XL) 15 MG 24 hr tablet Take 15 mg by mouth daily.     Polyethyl Glycol-Propyl Glycol (SYSTANE OP) Place 1-2 drops into both eyes daily as needed (dry eyes).     TRULICITY 0.75 MG/0.5ML SOPN Inject 0.75 mg into the skin once a week. Take on Wed     warfarin (COUMADIN) 5 MG tablet TAKE AS DIRECTED BY COUMADIN CLINIC 170 tablet 3   Current Facility-Administered Medications  Medication Dose Route Frequency Provider Last Rate Last Admin   betamethasone acetate-betamethasone sodium phosphate (CELESTONE) injection 3 mg  3 mg Intra-articular Once Felecia Shelling, DPM        Atrial Fibrillation Management history:  Previous antiarrhythmic drugs: None Previous cardioversions: 06/22/2018 Previous ablations: None  Anticoagulation history: warfarin   ROS- All systems are reviewed and negative except as per the HPI above.  Physical Exam: BP 128/72   Pulse (!) 52   Ht 5\' 6"  (1.676 m)   Wt 126.9 kg   BMI 45.16 kg/m   GEN: Well nourished, well developed in no acute distress NECK: No JVD; No carotid bruits CARDIAC: Irregularly irregular rate and rhythm, no murmurs, rubs, gallops RESPIRATORY:  Clear to auscultation without rales, wheezing or rhonchi  ABDOMEN: Soft, non-tender, non-distended EXTREMITIES:  No edema; No deformity   EKG today  demonstrates  Vent. rate 52 BPM PR interval * ms QRS duration 88 ms QT/QTcB 552/513 ms P-R-T axes * -13 6 Undetermined rhythm Low voltage QRS Nonspecific ST and T wave abnormality Prolonged QT Abnormal ECG When compared with ECG of 16-May-2023 11:52, PREVIOUS ECG IS PRESENT  Echo 01/01/23 demonstrated   1. Left ventricular ejection fraction, by estimation, is 55 to 60%. The  left ventricle has normal function. The left ventricle has no regional  wall motion abnormalities. Left ventricular diastolic parameters were  normal.   2. Right ventricular systolic function is normal. The right ventricular  size is normal. There is mildly elevated pulmonary artery systolic  pressure.   3. Mild to moderate mitral valve regurgitation.   4. Tricuspid valve regurgitation is mild to moderate.   5. Aortic valve regurgitation is trivial.   6. The inferior vena cava is dilated in size with >50% respiratory  variability, suggesting right atrial pressure of 8 mmHg.   ASSESSMENT & PLAN CHA2DS2-VASc Score = 4  The patient's score is based upon: CHF History: 0 HTN History: 1 Diabetes History: 1 Stroke History: 0 Vascular Disease History: 0 Age Score: 1 Gender Score: 1       ASSESSMENT AND PLAN: Permanent Atrial Flutter The patient's CHA2DS2-VASc score is 4, indicating a 4.8% annual risk of stroke.    Her heart rate today is difficult to discern. She is bradycardic at 52 bpm with what appears to be intermittent sinus beats vs junctional beats with retrograde p waves. I appreciate on physical exam an irregular heartbeat overall. Due to this and ED visit with HR down to the 20s, will place cardiac monitor for further assessment. Follow up will depend if needs to see EP versus back with Dr. Anne Fu for follow up holding carvedilol.   Secondary Hypercoagulable State (ICD10:  D68.69) The patient is at significant risk for stroke/thromboembolism based upon her CHA2DS2-VASc Score of 4.  Continue  Warfarin (Coumadin).   Follow up will be based on monitor results.    [name], PA-C  Afib Clinic [facility] [address] [place], [place] [address] [phone]

## 2023-05-25 ENCOUNTER — Telehealth: Payer: Self-pay

## 2023-05-25 NOTE — Telephone Encounter (Signed)
Call from pt. Had f/u for bradycardia. Has monitor 2 wks, likely will need a pacer.  Asked pt to call me when surgery/treatment complete and will fit her into another PREP class.  Pt agreeable.

## 2023-06-01 ENCOUNTER — Ambulatory Visit: Payer: Medicare HMO | Attending: Cardiovascular Disease

## 2023-06-01 DIAGNOSIS — I4892 Unspecified atrial flutter: Secondary | ICD-10-CM | POA: Diagnosis not present

## 2023-06-01 DIAGNOSIS — Z5181 Encounter for therapeutic drug level monitoring: Secondary | ICD-10-CM | POA: Diagnosis not present

## 2023-06-01 LAB — POCT INR: INR: 3.2 — AB (ref 2.0–3.0)

## 2023-06-01 NOTE — Patient Instructions (Signed)
HOLD TODAY ONLY THEN CONTINUE taking Warfarin 1.5 tablets daily except 1 tablet on  Thursdays. Keep leafy veggies consistent.  Recheck INR in 4 weeks. Coumadin Clinic [phone].

## 2023-06-13 ENCOUNTER — Other Ambulatory Visit: Payer: Self-pay | Admitting: Cardiology

## 2023-06-15 ENCOUNTER — Telehealth: Payer: Self-pay | Admitting: Cardiology

## 2023-06-15 DIAGNOSIS — R001 Bradycardia, unspecified: Secondary | ICD-10-CM | POA: Diagnosis not present

## 2023-06-15 NOTE — Telephone Encounter (Signed)
Pt stated she turned in her Heart Monitor a week ago but hasn't heard anything regarding results, updates or anything so she'd like to know what she should do next and is requesting a callback. Please advise

## 2023-06-17 ENCOUNTER — Telehealth: Payer: Self-pay | Admitting: Cardiology

## 2023-06-17 NOTE — Telephone Encounter (Signed)
Called pt advised that heart monitor was ordered by Nelva Bush, PA with Afib clinic.  Advised that Afib clinic will review results and inform pt of findings.  Pt reports really would like to know results.  Advised I will send a message to provider with this information.

## 2023-06-17 NOTE — Telephone Encounter (Signed)
Patient calling in about her heart monitor results and to say that her HR is still going up and down. Please advise

## 2023-06-18 ENCOUNTER — Encounter: Payer: Self-pay | Admitting: Cardiology

## 2023-06-18 ENCOUNTER — Encounter: Payer: Self-pay | Admitting: *Deleted

## 2023-06-18 ENCOUNTER — Ambulatory Visit: Payer: Medicare HMO | Attending: Cardiology | Admitting: Cardiology

## 2023-06-18 VITALS — BP 142/66 | HR 74 | Ht 66.0 in | Wt 284.0 lb

## 2023-06-18 DIAGNOSIS — I4892 Unspecified atrial flutter: Secondary | ICD-10-CM

## 2023-06-18 DIAGNOSIS — Z01812 Encounter for preprocedural laboratory examination: Secondary | ICD-10-CM | POA: Diagnosis not present

## 2023-06-18 DIAGNOSIS — D6869 Other thrombophilia: Secondary | ICD-10-CM

## 2023-06-18 DIAGNOSIS — I495 Sick sinus syndrome: Secondary | ICD-10-CM | POA: Diagnosis not present

## 2023-06-18 NOTE — Patient Instructions (Signed)
Medication Instructions:  Your physician recommends that you continue on your current medications as directed. Please refer to the Current Medication list given to you today.     * If you need a refill on your cardiac medications before your next appointment, please call your pharmacy. *   Labwork: Pre procedure lab work today: BMET & CBC   * Will notify you of abnormal results, otherwise continue current treatment plan.*   Testing/Procedures: Your physician has recommended that you have a pacemaker inserted. A pacemaker is a small device that is placed under the skin of your chest or abdomen to help control abnormal heart rhythms. This device uses electrical pulses to prompt the heart to beat at a normal rate. Pacemakers are used to treat heart rhythms that are too slow. Wire (leads) are attached to the pacemaker that goes into the chambers of you heart. This is done in the hospital and usually requires and overnight stay. Please follow the instructions below, located under the special instructions section.   Follow-Up: Your physician recommends that you schedule a wound check appointment 10-14 days, after your procedure on 9/6, with the device clinic.  Your physician recommends that you schedule a follow up appointment in 91 days, after your procedure on 9/6, with Dr. Elberta Fortis.  * Please note that any paperwork needing to be filled out by the provider will need to be addressed at the front desk prior to seeing the provider.  Please note that any FMLA, disability or other documents regarding health condition is subject to a $25.00 charge that must be received prior to completion of paperwork in the form of a money order or check. *  Thank you for choosing [facility]!!   Dory Horn, RN [phone]   Other Instructions    Pacemaker Implantation, Adult Pacemaker implantation is a procedure to place a pacemaker inside your chest. A pacemaker is a small computer that sends  electrical signals to the heart and helps your heart beat normally. A pacemaker also stores information about your heart rhythms. You may need pacemaker implantation if you: Have a slow heartbeat (bradycardia). Faint (syncope). Have shortness of breath (dyspnea) due to heart problems.  The pacemaker attaches to your heart through a wire, called a lead. Sometimes just one lead is needed. Other times, there will be two leads. There are two types of pacemakers: Transvenous pacemaker. This type is placed under the skin or muscle of your chest. The lead goes through a vein in the chest area to reach the inside of the heart. Epicardial pacemaker. This type is placed under the skin or muscle of your chest or belly. The lead goes through your chest to the outside of the heart.  Tell a health care provider about: Any allergies you have. All medicines you are taking, including vitamins, herbs, eye drops, creams, and over-the-counter medicines. Any problems you or family members have had with anesthetic medicines. Any blood or bone disorders you have. Any surgeries you have had. Any medical conditions you have. Whether you are pregnant or may be pregnant. What are the risks? Generally, this is a safe procedure. However, problems may occur, including: Infection. Bleeding. Failure of the pacemaker or the lead. Collapse of a lung or bleeding into a lung. Blood clot inside a blood vessel with a lead. Damage to the heart. Infection inside the heart (endocarditis). Allergic reactions to medicines.  What happens before the procedure? Staying hydrated Follow instructions from your health care provider about hydration, which  may include: Up to 2 hours before the procedure - you may continue to drink clear liquids, such as water, clear fruit juice, black coffee, and plain tea.  Eating and drinking restrictions Follow instructions from your health care provider about eating and drinking, which may  include: 8 hours before the procedure - stop eating heavy meals or foods such as meat, fried foods, or fatty foods. 6 hours before the procedure - stop eating light meals or foods, such as toast or cereal. 6 hours before the procedure - stop drinking milk or drinks that contain milk. 2 hours before the procedure - stop drinking clear liquids.  Medicines Ask your health care provider about: Changing or stopping your regular medicines. This is especially important if you are taking diabetes medicines or blood thinners. Taking medicines such as aspirin and ibuprofen. These medicines can thin your blood. Do not take these medicines before your procedure if your health care provider instructs you not to. You may be given antibiotic medicine to help prevent infection. General instructions You will have a heart evaluation. This may include an electrocardiogram (ECG), chest X-ray, and heart imaging (echocardiogram,  or echo) tests. You will have blood tests. Do not use any products that contain nicotine or tobacco, such as cigarettes and e-cigarettes. If you need help quitting, ask your health care provider. Plan to have someone take you home from the hospital or clinic. If you will be going home right after the procedure, plan to have someone with you for 24 hours. Ask your health care provider how your surgical site will be marked or identified. What happens during the procedure? To reduce your risk of infection: Your health care team will wash or sanitize their hands. Your skin will be washed with soap. Hair may be removed from the surgical area. An IV tube will be inserted into one of your veins. You will be given one or more of the following: A medicine to help you relax (sedative). A medicine to numb the area (local anesthetic). A medicine to make you fall asleep (general anesthetic). If you are getting a transvenous pacemaker: An incision will be made in your upper chest. A pocket will  be made for the pacemaker. It may be placed under the skin or between layers of muscle. The lead will be inserted into a blood vessel that returns to the heart. While X-rays are taken by an imaging machine (fluoroscopy), the lead will be advanced through the vein to the inside of your heart. The other end of the lead will be tunneled under the skin and attached to the pacemaker. If you are getting an epicardial pacemaker: An incision will be made near your ribs or breastbone (sternum) for the lead. The lead will be attached to the outside of your heart. Another incision will be made in your chest or upper belly to create a pocket for the pacemaker. The free end of the lead will be tunneled under the skin and attached to the pacemaker. The transvenous or epicardial pacemaker will be tested. Imaging studies may be done to check the lead position. The incisions will be closed with stitches (sutures), adhesive strips, or skin glue. Bandages (dressing) will be placed over the incisions. The procedure may vary among health care providers and hospitals. What happens after the procedure? Your blood pressure, heart rate, breathing rate, and blood oxygen level will be monitored until the medicines you were given have worn off. You will be given antibiotics and pain medicine. ECG  and chest x-rays will be done. You will wear a continuous type of ECG (Holter monitor) to check your heart rhythm. Your health care provider will program the pacemaker. Do not drive for 24 hours if you received a sedative. This information is not intended to replace advice given to you by your health care provider. Make sure you discuss any questions you have with your health care provider. Document Released: 09/25/2002 Document Revised: 04/24/2016 Document Reviewed: 03/18/2016 Elsevier Interactive Patient Education  2018 [employer].     Pacemaker Implantation, Adult, Care After This sheet gives you information about how  to care for yourself after your procedure. Your health care provider may also give you more specific instructions. If you have problems or questions, contact your health care provider. What can I expect after the procedure? After the procedure, it is common to have: Mild pain. Slight bruising. Some swelling over the incision. A slight bump over the skin where the device was placed. Sometimes, it is possible to feel the device under the skin. This is normal.  Follow these instructions at home: Medicines Take over-the-counter and prescription medicines only as told by your health care provider. If you were prescribed an antibiotic medicine, take it as told by your health care provider. Do not stop taking the antibiotic even if you start to feel better. Wound care Do not remove the bandage on your chest until directed to do so by your health care provider. After your bandage is removed, you may see pieces of tape called skin adhesive strips over the area where the cut was made (incision site). Let them fall off on their own. Check the incision site every day to make sure it is not infected, bleeding, or starting to pull apart. Do not use lotions or ointments near the incision site unless directed to do so. Keep the incision area clean and dry for 2-3 days after the procedure or as directed by your health care provider. It takes several weeks for the incision site to completely heal. Do not take baths, swim, or use a hot tub for 7-10 days or as otherwise directed by your health care provider. Activity Do not drive or use heavy machinery while taking prescription pain medicine. Do not drive for 24 hours if you were given a medicine to help you relax (sedative). Check with your health care provider before you start to drive or play sports. Avoid sudden jerking, pulling, or chopping movements that pull your upper arm far away from your body. Avoid these movements for at least 6 weeks or as long as  told by your health care provider. Do not lift your upper arm above your shoulders for at least 6 weeks or as long as told by your health care provider. This means no tennis, golf, or swimming. You may go back to work when your health care provider says it is okay. Pacemaker care You may be shown how to transfer data from your pacemaker through the phone to your health care provider. Always let all health care providers know about your pacemaker before you have any medical procedures or tests. Wear a medical ID bracelet or necklace stating that you have a pacemaker. Carry a pacemaker ID card with you at all times. Your pacemaker battery will last for 5-15 years. Routine checks by your health care provider will let the health care provider know when the battery is starting to run down. The pacemaker will need to be replaced when the battery starts to  run down. Do not use amateur Proofreader. Other electrical devices are safe to use, including power tools, lawn mowers, and speakers. If you are unsure of whether something is safe to use, ask your health care provider. When using your cell phone, hold it to the ear opposite the pacemaker. Do not leave your cell phone in a pocket over the pacemaker. Avoid places or objects that have a strong electric or magnetic field, including: Scientist, physiological. When at the airport, let officials know that you have a pacemaker. Power plants. Large electrical generators. Radiofrequency transmission towers, such as cell phone and radio towers. General instructions Weigh yourself every day. If you suddenly gain weight, fluid may be building up in your body. Keep all follow-up visits as told by your health care provider. This is important. Contact a health care provider if: You gain weight suddenly. Your legs or feet swell. It feels like your heart is fluttering or skipping beats (heart palpitations). You have chills or a  fever. You have more redness, swelling, or pain around your incisions. You have more fluid or blood coming from your incisions. Your incisions feel warm to the touch. You have pus or a bad smell coming from your incisions. Get help right away if: You have chest pain. You have trouble breathing or are short of breath. You become extremely tired. You are light-headed or you faint. This information is not intended to replace advice given to you by your health care provider. Make sure you discuss any questions you have with your health care provider. Document Released: 04/24/2005 Document Revised: 07/17/2016 Document Reviewed: 07/17/2016 Elsevier Interactive Patient Education  2018 [employer].    Supplemental Discharge Instructions for  Pacemaker/Defibrillator Patients  ACTIVITY No heavy lifting or vigorous activity with your left/right arm for 6 to 8 weeks.  Do not raise your left/right arm above your head for one week.  Gradually raise your affected arm as drawn below.           __  NO DRIVING for     ; you may begin driving on     .  WOUND CARE Keep the wound area clean and dry.  Do not get this area wet for one week. No showers for one week; you may shower on     . The tape/steri-strips on your wound will fall off; do not pull them off.  No bandage is needed on the site.  DO  NOT apply any creams, oils, or ointments to the wound area. If you notice any drainage or discharge from the wound, any swelling or bruising at the site, or you develop a fever > 101? F after you are discharged home, call the office at once.  SPECIAL INSTRUCTIONS You are still able to use cellular telephones; use the ear opposite the side where you have your pacemaker/defibrillator.  Avoid carrying your cellular phone near your device. When traveling through airports, show security personnel your identification card to avoid being screened in the metal detectors.  Ask the security personnel to use the hand  wand. Avoid arc welding equipment, MRI testing (magnetic resonance imaging), TENS units (transcutaneous nerve stimulators).  Call the office for questions about other devices. Avoid electrical appliances that are in poor condition or are not properly grounded. Microwave ovens are safe to be near or to operate.  ADDITIONAL INFORMATION FOR DEFIBRILLATOR PATIENTS SHOULD YOUR DEVICE GO OFF: If your device goes off ONCE and you feel fine afterward,  notify the device clinic nurses. If your device goes off ONCE and you do not feel well afterward, call 911. If your device goes off TWICE, call 911. If your device goes off THREE TIMES IN ONE DAY, call 911.  DO NOT DRIVE YOURSELF OR A FAMILY MEMBER WITH A DEFIBRILLATOR TO THE HOSPITAL--CALL 911.

## 2023-06-18 NOTE — Progress Notes (Signed)
Electrophysiology Office Note:   Date:  06/18/2023  ID:  Linda Washington, DOB 05/18/1951, MRN [id number]  Primary Cardiologist: Donato Schultz, MD Electrophysiologist: None      History of Present Illness:   Linda Washington is a 72 y.o. female with h/o atrial flutter, sick sinus syndrome seen today for  for Electrophysiology evaluation of sick sinus syndrome at the request of [name].    She has a longstanding history of atrial flutter.  She went to the emergency room for weakness and bradycardia as well as shortness of breath.  She had heart rates in the 30s and 40s, normally they are in the 60s.  She wore a cardiac monitor that showed episodes of sinus and junctional bradycardia with pauses of up to 4 seconds.  During these episodes, she feels dizzy, weak, fatigued, short of breath.  She was previously on carvedilol, but this was stopped prior to wearing her monitor.  Review of systems complete and found to be negative unless listed in HPI.   EP Information / Studies Reviewed:    EKG is ordered today. Personal review as below.  EKG Interpretation Date/Time:  Friday June 18 2023 14:47:15 EDT Ventricular Rate:  74 PR Interval:  192 QRS Duration:  96 QT Interval:  412 QTC Calculation: 457 R Axis:   -35  Text Interpretation: Normal sinus rhythm with sinus arrhythmia Left axis deviation When compared with ECG of 24-May-2023 15:02, Previous ECG has undetermined rhythm, needs review T wave inversion no longer evident in Inferior leads T wave inversion less evident in Anterolateral leads Mobitz I 2-degree AV block (Wenckebach block) is no longer Confirmed by Jacere Pangborn ([id number]) on 06/18/2023 2:55:17 PM     Risk Assessment/Calculations:    CHA2DS2-VASc Score = 4   This indicates a 4.8% annual risk of stroke. The patient's score is based upon: CHF History: 0 HTN History: 1 Diabetes History: 1 Stroke History: 0 Vascular Disease History: 0 Age Score: 1 Gender Score: 1             Physical Exam:   VS:  BP (!) 142/66   Pulse 74   Ht 5\' 6"  (1.676 m)   Wt 284 lb (128.8 kg)   SpO2 95%   BMI 45.84 kg/m    Wt Readings from Last 3 Encounters:  06/18/23 284 lb (128.8 kg)  05/24/23 279 lb 12.8 oz (126.9 kg)  05/10/23 277 lb 9.6 oz (125.9 kg)     GEN: Well nourished, well developed in no acute distress NECK: No JVD; No carotid bruits CARDIAC: Regular rate and rhythm, no murmurs, rubs, gallops RESPIRATORY:  Clear to auscultation without rales, wheezing or rhonchi  ABDOMEN: Soft, non-tender, non-distended EXTREMITIES:  No edema; No deformity   ASSESSMENT AND PLAN:    1.  Sick sinus syndrome: Occurs intermittently.  She has episodes of both junctional bradycardia and up to 4-second pauses.  She feels fatigued with shortness of breath when she has these episodes.  She would benefit pacemaker implant.  Risk and benefits were discussed which include bleeding, tamponade, infection, pneumothorax, lead dislodgment.  She understands these risks and is agreed to the procedure.  2.  Atrial flutter: Has controlled ventricular response.  3.  Secondary hypercoagulable state: Currently on warfarin for atrial flutter  4.  Obesity: Weight loss encouraged  5.  Obstructive sleep apnea: CPAP compliance encouraged  Case discussed with primary cardiology  Follow up with Dr. Elberta Fortis as usual post procedure  Signed, Kristy Schomburg Jorja Loa, MD

## 2023-06-18 NOTE — H&P (View-Only) (Signed)
 Electrophysiology Office Note:   Date:  06/18/2023  ID:  Linda Washington, DOB [birth year], MRN [id number]  Primary Cardiologist: Oneil Parchment, MD Electrophysiologist: None      History of Present Illness:   Linda Washington is a 72 y.o. female with h/o atrial flutter, sick sinus syndrome seen today for  for Electrophysiology evaluation of sick sinus syndrome at the request of Fairy Heinrich.    She has a longstanding history of atrial flutter.  She went to the emergency room for weakness and bradycardia as well as shortness of breath.  She had heart rates in the 30s and 40s, normally they are in the 60s.  She wore a cardiac monitor that showed episodes of sinus and junctional bradycardia with pauses of up to 4 seconds.  During these episodes, she feels dizzy, weak, fatigued, short of breath.  She was previously on carvedilol , but this was stopped prior to wearing her monitor.  Review of systems complete and found to be negative unless listed in HPI.   EP Information / Studies Reviewed:    EKG is ordered today. Personal review as below.  EKG Interpretation Date/Time:  Friday June 18 2023 14:47:15 EDT Ventricular Rate:  74 PR Interval:  192 QRS Duration:  96 QT Interval:  412 QTC Calculation: 457 R Axis:   -35  Text Interpretation: Normal sinus rhythm with sinus arrhythmia Left axis deviation When compared with ECG of 24-May-2023 15:02, Previous ECG has undetermined rhythm, needs review T wave inversion no longer evident in Inferior leads T wave inversion less evident in Anterolateral leads Mobitz I 2-degree AV block (Wenckebach block) is no longer Confirmed by Chastin Riesgo ([id number]) on 06/18/2023 2:55:17 PM     Risk Assessment/Calculations:    CHA2DS2-VASc Score = 4   This indicates a 4.8% annual risk of stroke. The patient's score is based upon: CHF History: 0 HTN History: 1 Diabetes History: 1 Stroke History: 0 Vascular Disease History: 0 Age Score: 1 Gender Score: 1             Physical Exam:   VS:  BP (!) 142/66   Pulse 74   Ht 5\' 6"  (1.676 m)   Wt 284 lb (128.8 kg)   SpO2 95%   BMI 45.84 kg/m    Wt Readings from Last 3 Encounters:  06/18/23 284 lb (128.8 kg)  05/24/23 279 lb 12.8 oz (126.9 kg)  05/10/23 277 lb 9.6 oz (125.9 kg)     GEN: Well nourished, well developed in no acute distress NECK: No JVD; No carotid bruits CARDIAC: Regular rate and rhythm, no murmurs, rubs, gallops RESPIRATORY:  Clear to auscultation without rales, wheezing or rhonchi  ABDOMEN: Soft, non-tender, non-distended EXTREMITIES:  No edema; No deformity   ASSESSMENT AND PLAN:    1.  Sick sinus syndrome: Occurs intermittently.  She has episodes of both junctional bradycardia and up to 4-second pauses.  She feels fatigued with shortness of breath when she has these episodes.  She would benefit pacemaker implant.  Risk and benefits were discussed which include bleeding, tamponade, infection, pneumothorax, lead dislodgment.  She understands these risks and is agreed to the procedure.  2.  Atrial flutter: Has controlled ventricular response.  3.  Secondary hypercoagulable state: Currently on warfarin for atrial flutter  4.  Obesity: Weight loss encouraged  5.  Obstructive sleep apnea: CPAP compliance encouraged  Case discussed with primary cardiology  Follow up with Dr. Inocencio as usual post procedure  Signed, Corwin Kuiken Gladis Inocencio, MD

## 2023-06-19 LAB — CBC
Hematocrit: 38.5 % (ref 34.0–46.6)
Hemoglobin: 12.5 g/dL (ref 11.1–15.9)
MCH: 28.9 pg (ref 26.6–33.0)
MCHC: 32.5 g/dL (ref 31.5–35.7)
MCV: 89 fL (ref 79–97)
Platelets: 355 10*3/uL (ref 150–450)
RBC: 4.32 x10E6/uL (ref 3.77–5.28)
RDW: 14.5 % (ref 11.7–15.4)
WBC: 9.9 10*3/uL (ref 3.4–10.8)

## 2023-06-19 LAB — BASIC METABOLIC PANEL
BUN/Creatinine Ratio: 17 (ref 12–28)
BUN: 14 mg/dL (ref 8–27)
CO2: 24 mmol/L (ref 20–29)
Calcium: 9.5 mg/dL (ref 8.7–10.3)
Chloride: 103 mmol/L (ref 96–106)
Creatinine, Ser: 0.82 mg/dL (ref 0.57–1.00)
Glucose: 101 mg/dL — ABNORMAL HIGH (ref 70–99)
Potassium: 4.3 mmol/L (ref 3.5–5.2)
Sodium: 142 mmol/L (ref 134–144)
eGFR: 75 mL/min/{1.73_m2} (ref >59)

## 2023-06-22 ENCOUNTER — Telehealth: Payer: Self-pay | Admitting: Cardiology

## 2023-06-22 NOTE — Telephone Encounter (Signed)
Returned pt call. She was asking about her Metformin and how long she needed to hold it.  Instructed pt to hold only the morning of procedure when she is holding all morning medications.  Patient verbalized understanding and agreeable to plan.

## 2023-06-22 NOTE — Telephone Encounter (Signed)
Pt called in asking to speak with Sherri, RN about her surgery this week

## 2023-06-23 DIAGNOSIS — E1142 Type 2 diabetes mellitus with diabetic polyneuropathy: Secondary | ICD-10-CM | POA: Diagnosis not present

## 2023-06-24 ENCOUNTER — Telehealth: Payer: Self-pay

## 2023-06-24 NOTE — Telephone Encounter (Signed)
Pt aware that her procedure has been moved on 9/6 from 230 to 930 and she will need to arrive at [facility] at 730 AM.

## 2023-06-24 NOTE — Pre-Procedure Instructions (Signed)
Attempted to call patient regarding procedure instructions.  Left voicemail on the following items: Arrival time 0730 Nothing to eat or drink after midnight No meds AM of procedure Responsible person to drive you home and stay with you for 24 hrs Wash with special soap night before and morning of procedure If on anti-coagulant drug instructions Coumadin- last dose 9/2

## 2023-06-25 ENCOUNTER — Ambulatory Visit (HOSPITAL_COMMUNITY)
Admission: RE | Admit: 2023-06-25 | Discharge: 2023-06-25 | Disposition: A | Discharge status: Home / Self Care | Payer: Medicare HMO | Source: Ambulatory Visit | Attending: Cardiology | Admitting: Cardiology

## 2023-06-25 ENCOUNTER — Other Ambulatory Visit: Payer: Self-pay

## 2023-06-25 ENCOUNTER — Ambulatory Visit (HOSPITAL_COMMUNITY)
Admission: RE | Disposition: A | Discharge status: Home / Self Care | Payer: Self-pay | Source: Ambulatory Visit | Attending: Cardiology

## 2023-06-25 ENCOUNTER — Ambulatory Visit (HOSPITAL_COMMUNITY): Payer: Medicare HMO

## 2023-06-25 DIAGNOSIS — G4733 Obstructive sleep apnea (adult) (pediatric): Secondary | ICD-10-CM | POA: Insufficient documentation

## 2023-06-25 DIAGNOSIS — Z6841 Body Mass Index (BMI) 40.0 and over, adult: Secondary | ICD-10-CM | POA: Diagnosis not present

## 2023-06-25 DIAGNOSIS — D6869 Other thrombophilia: Secondary | ICD-10-CM | POA: Diagnosis not present

## 2023-06-25 DIAGNOSIS — E669 Obesity, unspecified: Secondary | ICD-10-CM | POA: Insufficient documentation

## 2023-06-25 DIAGNOSIS — J9811 Atelectasis: Secondary | ICD-10-CM | POA: Diagnosis not present

## 2023-06-25 DIAGNOSIS — R001 Bradycardia, unspecified: Secondary | ICD-10-CM

## 2023-06-25 DIAGNOSIS — I4892 Unspecified atrial flutter: Secondary | ICD-10-CM | POA: Diagnosis not present

## 2023-06-25 DIAGNOSIS — I495 Sick sinus syndrome: Secondary | ICD-10-CM | POA: Insufficient documentation

## 2023-06-25 DIAGNOSIS — I517 Cardiomegaly: Secondary | ICD-10-CM | POA: Diagnosis not present

## 2023-06-25 DIAGNOSIS — Z7901 Long term (current) use of anticoagulants: Secondary | ICD-10-CM | POA: Insufficient documentation

## 2023-06-25 DIAGNOSIS — R0989 Other specified symptoms and signs involving the circulatory and respiratory systems: Secondary | ICD-10-CM | POA: Diagnosis not present

## 2023-06-25 HISTORY — PX: PACEMAKER IMPLANT: EP1218

## 2023-06-25 LAB — GLUCOSE, CAPILLARY: Glucose-Capillary: 144 mg/dL — ABNORMAL HIGH (ref 70–99)

## 2023-06-25 LAB — PROTIME-INR
INR: 1.6 — ABNORMAL HIGH (ref 0.8–1.2)
Prothrombin Time: 19.4 s — ABNORMAL HIGH (ref 11.4–15.2)

## 2023-06-25 SURGERY — PACEMAKER IMPLANT

## 2023-06-25 MED ORDER — FENTANYL CITRATE (PF) 100 MCG/2ML IJ SOLN
INTRAMUSCULAR | Status: DC | PRN
  Administered 2023-06-25 (×2): 12.5 ug via INTRAVENOUS
  Administered 2023-06-25: 25 ug via INTRAVENOUS

## 2023-06-25 MED ORDER — POVIDONE-IODINE 10 % EX SWAB: 2.0000 | Freq: Once | CUTANEOUS | Status: DC

## 2023-06-25 MED ORDER — MIDAZOLAM HCL 5 MG/5ML IJ SOLN
INTRAMUSCULAR | Status: DC | PRN
  Administered 2023-06-25 (×2): 1 mg via INTRAVENOUS

## 2023-06-25 MED ORDER — CEFAZOLIN IN SODIUM CHLORIDE 3-0.9 GM/100ML-% IV SOLN
3.0000 g | INTRAVENOUS | Status: AC
  Administered 2023-06-25: 3 g via INTRAVENOUS
  Filled 2023-06-25: qty 100

## 2023-06-25 MED ORDER — CEFAZOLIN SODIUM-DEXTROSE 1-4 GM/50ML-% IV SOLN
1.0000 g | Freq: Four times a day (QID) | INTRAVENOUS | Status: DC
  Administered 2023-06-25: 1 g via INTRAVENOUS

## 2023-06-25 MED ORDER — HEPARIN (PORCINE) IN NACL 1000-0.9 UT/500ML-% IV SOLN
INTRAVENOUS | Status: DC | PRN
  Administered 2023-06-25: 500 mL

## 2023-06-25 MED ORDER — ACETAMINOPHEN 325 MG PO TABS: 325.0000 mg | ORAL_TABLET | ORAL | Status: DC | PRN

## 2023-06-25 MED ORDER — CEFAZOLIN SODIUM-DEXTROSE 1-4 GM/50ML-% IV SOLN
INTRAVENOUS | Status: AC
  Filled 2023-06-25: qty 50

## 2023-06-25 MED ORDER — SODIUM CHLORIDE 0.9 % IV SOLN: INTRAVENOUS | Status: DC

## 2023-06-25 MED ORDER — ONDANSETRON HCL 4 MG/2ML IJ SOLN: 4.0000 mg | Freq: Four times a day (QID) | INTRAMUSCULAR | Status: DC | PRN

## 2023-06-25 MED ORDER — TRAMADOL HCL 50 MG PO TABS: 50.0000 mg | ORAL_TABLET | Freq: Once | ORAL | Status: DC

## 2023-06-25 MED ORDER — MIDAZOLAM HCL 5 MG/5ML IJ SOLN
INTRAMUSCULAR | Status: AC
  Filled 2023-06-25: qty 5

## 2023-06-25 MED ORDER — CHLORHEXIDINE GLUCONATE 4 % EX SOLN: 4.0000 | Freq: Once | CUTANEOUS | Status: DC

## 2023-06-25 MED ORDER — LIDOCAINE HCL (PF) 1 % IJ SOLN
INTRAMUSCULAR | Status: DC | PRN
  Administered 2023-06-25: 60 mL

## 2023-06-25 MED ORDER — FENTANYL CITRATE (PF) 100 MCG/2ML IJ SOLN
INTRAMUSCULAR | Status: AC
  Filled 2023-06-25: qty 2

## 2023-06-25 MED ORDER — SODIUM CHLORIDE 0.9 % IV SOLN
INTRAVENOUS | Status: AC
  Filled 2023-06-25: qty 2

## 2023-06-25 MED ORDER — LIDOCAINE HCL (PF) 1 % IJ SOLN
INTRAMUSCULAR | Status: AC
  Filled 2023-06-25: qty 60

## 2023-06-25 MED ORDER — SODIUM CHLORIDE 0.9 % IV SOLN
80.0000 mg | INTRAVENOUS | Status: AC
  Administered 2023-06-25: 80 mg

## 2023-06-25 MED ORDER — LIDOCAINE HCL (PF) 1 % IJ SOLN
INTRAMUSCULAR | Status: AC
  Filled 2023-06-25: qty 30

## 2023-06-25 SURGICAL SUPPLY — 14 items
CABLE SURGICAL S-101-97-12 (CABLE) ×1 IMPLANT
CATH CPS LOCATOR 3D MED (CATHETERS) IMPLANT
GUIDEWIRE VASC J-TIP .035X150 (WIRE) IMPLANT
HELIX LOCKING TOOL (MISCELLANEOUS) ×1
LEAD ULTIPACE 52 LPA1231/52 (Lead) IMPLANT
LEAD ULTIPACE 65 LPA1231/65 (Lead) IMPLANT
PACEMAKER ASSURITY DR-RF (Pacemaker) IMPLANT
PAD DEFIB RADIO PHYSIO CONN (PAD) ×1 IMPLANT
SHEATH 7FR PRELUDE SNAP 13 (SHEATH) IMPLANT
SHEATH 9FR PRELUDE SNAP 13 (SHEATH) IMPLANT
SHEATH PROBE COVER 6X72 (BAG) IMPLANT
SLITTER AGILIS HISPRO (INSTRUMENTS) IMPLANT
TOOL HELIX LOCKING (MISCELLANEOUS) IMPLANT
TRAY PACEMAKER INSERTION (PACKS) ×1 IMPLANT

## 2023-06-25 NOTE — Interval H&P Note (Signed)
History and Physical Interval Note:  06/25/2023 7:12 AM  Linda Washington  has presented today for surgery, with the diagnosis of bradicardia.  The various methods of treatment have been discussed with the patient and family. After consideration of risks, benefits and other options for treatment, the patient has consented to  Procedure(s): PACEMAKER IMPLANT (N/A) as a surgical intervention.  The patient's history has been reviewed, patient examined, no change in status, stable for surgery.  I have reviewed the patient's chart and labs.  Questions were answered to the patient's satisfaction.     Trajon Rosete [employer]

## 2023-06-25 NOTE — Discharge Instructions (Addendum)
After Your Pacemaker   You have a [employer] Pacemaker  ACTIVITY Do not lift your arm above shoulder height for 1 week after your procedure. After 7 days, you may progress as below.  You should remove your sling 24 hours after your procedure, unless otherwise instructed by your provider.     Friday July 02, 2023  Saturday July 03, 2023 Sunday July 04, 2023 Monday July 05, 2023   Do not lift, push, pull, or carry anything over 10 pounds with the affected arm until 6 weeks (Friday August 06, 2023 ) after your procedure.   You may drive AFTER your wound check, unless you have been told otherwise by your provider.   Ask your healthcare provider when you can go back to work   INCISION/Dressing If you are on a blood thinner such as Coumadin, Xarelto, Eliquis, Plavix, or Pradaxa please confirm with your provider when this should be resumed. Resume warfarin today at your usual dosing schedule  If large square, outer bandage is left in place, this can be removed after 24 hours from your procedure. Do not remove steri-strips or glue as below.   If a PRESSURE DRESSING (a bulky dressing that usually goes up over your shoulder) was applied or left in place, please follow instructions given by your provider on when to return to have this removed.   Monitor your Pacemaker site for redness, swelling, and drainage. Call the device clinic at [phone] if you experience these symptoms or fever/chills.  If your incision is sealed with Steri-strips or staples, you may shower 7 days after your procedure or when told by your provider. Do not remove the steri-strips or let the shower hit directly on your site. You may wash around your site with soap and water.    If you were discharged in a sling, please do not wear this during the day more than 48 hours after your surgery unless otherwise instructed. This may increase the risk of stiffness and soreness in your shoulder.   Avoid lotions,  ointments, or perfumes over your incision until it is well-healed.  You may use a hot tub or a pool AFTER your wound check appointment if the incision is completely closed.  Pacemaker Alerts:  Some alerts are vibratory and others beep. These are NOT emergencies. Please call our office to let us know. If this occurs at night or on weekends, it can wait until the next business day. Send a remote transmission.  If your device is capable of reading fluid status (for heart failure), you will be offered monthly monitoring to review this with you.   DEVICE MANAGEMENT Remote monitoring is used to monitor your pacemaker from home. This monitoring is scheduled every 91 days by our office. It allows [place] to keep an eye on the functioning of your device to ensure it is working properly. You will routinely see your Electrophysiologist annually (more often if necessary).   You should receive your ID card for your new device in 4-8 weeks. Keep this card with you at all times once received. Consider wearing a medical alert bracelet or necklace.  Your Pacemaker may be MRI compatible. This will be discussed at your next office visit/wound check.  You should avoid contact with strong electric or magnetic fields.   Do not use amateur (ham) radio equipment or electric (arc) welding torches. MP3 player headphones with magnets should not be used. Some devices are safe to use if held at least 12 inches (30 cm) from  your Pacemaker. These include power tools, lawn mowers, and speakers. If you are unsure if something is safe to use, ask your health care provider.  When using your cell phone, hold it to the ear that is on the opposite side from the Pacemaker. Do not leave your cell phone in a pocket over the Pacemaker.  You may safely use electric blankets, heating pads, computers, and microwave ovens.  Call the office right away if: You have chest pain. You feel more short of breath than you have felt before. You feel  more light-headed than you have felt before. Your incision starts to open up.  This information is not intended to replace advice given to you by your health care provider. Make sure you discuss any questions you have with your health care provider.

## 2023-06-25 NOTE — Progress Notes (Signed)
Client had complained of pacer site pain 8/10 and requested percocet; Dr Elberta Fortis notified of complaint and order noted and client states does not want pain med now and states pain decreased to 4/10; Dr Elberta Fortis notified of CXR no read and he read CXR and per Dr Elberta Fortis ok to d/c home

## 2023-06-28 ENCOUNTER — Telehealth: Payer: Self-pay | Admitting: Cardiology

## 2023-06-28 ENCOUNTER — Encounter (HOSPITAL_COMMUNITY): Payer: Self-pay | Admitting: Cardiology

## 2023-06-28 NOTE — Telephone Encounter (Signed)
Left message to call back  

## 2023-06-28 NOTE — Progress Notes (Signed)
Called client for post procedure followup and went over client's discharge instructions  and she voiced understanding and printed instructions to mail to client

## 2023-06-28 NOTE — Telephone Encounter (Signed)
Pt is requesting a callback after having a pace maker procedure done on 9/6, she now has some question and would like to speak with the nurse. Please advise

## 2023-06-28 NOTE — Telephone Encounter (Signed)
Pt states they did not get discharge papers before leaving the hospital. She is asking if she can shower after a week -- instructed that she can but she cannot get site area wet.  Preference is to continue bed baths until wound check next week. She is also asking if she can wear a bra -- informed that she may. Aware I will discuss with device clinic about sending her some information via mychart w/ instructions. Patient verbalized understanding and agreeable to plan.

## 2023-06-29 MED FILL — Lidocaine HCl Local Preservative Free (PF) Inj 1%: INTRAMUSCULAR | Qty: 30 | Status: AC

## 2023-07-02 ENCOUNTER — Telehealth: Payer: Self-pay | Admitting: *Deleted

## 2023-07-02 NOTE — Telephone Encounter (Signed)
Pt asking if she can leave her house, as she thought she was told she could not until her wound check. Pt informed that she may leave her house but that she cannot drive until cleared at wound check next week.  Also reminded to continue all post procedure instructions. Patient verbalized understanding and agreeable to plan.

## 2023-07-02 NOTE — Telephone Encounter (Signed)
Patient requested a call back directly from [employer].

## 2023-07-02 NOTE — Telephone Encounter (Signed)
Pt called to report she had a pacemaker implant done last week (9/6). She states she is doing well and look forward to seeing [place] on 07/06/23.

## 2023-07-06 ENCOUNTER — Ambulatory Visit: Payer: Medicare HMO

## 2023-07-08 ENCOUNTER — Ambulatory Visit: Payer: Medicare HMO | Attending: Cardiovascular Disease

## 2023-07-08 ENCOUNTER — Ambulatory Visit (INDEPENDENT_AMBULATORY_CARE_PROVIDER_SITE_OTHER): Payer: Medicare HMO

## 2023-07-08 DIAGNOSIS — I4892 Unspecified atrial flutter: Secondary | ICD-10-CM

## 2023-07-08 DIAGNOSIS — I495 Sick sinus syndrome: Secondary | ICD-10-CM | POA: Diagnosis not present

## 2023-07-08 DIAGNOSIS — Z5181 Encounter for therapeutic drug level monitoring: Secondary | ICD-10-CM | POA: Diagnosis not present

## 2023-07-08 LAB — CUP PACEART INCLINIC DEVICE CHECK
Battery Remaining Longevity: 92 mo
Battery Voltage: 3.05 V
Brady Statistic RA Percent Paced: 47 %
Brady Statistic RV Percent Paced: 45 %
Date Time Interrogation Session: 20240919085551
Implantable Lead Connection Status: 753985
Implantable Lead Connection Status: 753985
Implantable Lead Implant Date: 20240906
Implantable Lead Implant Date: 20240906
Implantable Lead Location: 753859
Implantable Lead Location: 753860
Implantable Pulse Generator Implant Date: 20240906
Lead Channel Impedance Value: 437.5 Ohm
Lead Channel Impedance Value: 475 Ohm
Lead Channel Pacing Threshold Amplitude: 0.5 V
Lead Channel Pacing Threshold Amplitude: 0.5 V
Lead Channel Pacing Threshold Amplitude: 0.75 V
Lead Channel Pacing Threshold Amplitude: 0.75 V
Lead Channel Pacing Threshold Pulse Width: 0.5 ms
Lead Channel Pacing Threshold Pulse Width: 0.5 ms
Lead Channel Pacing Threshold Pulse Width: 0.5 ms
Lead Channel Pacing Threshold Pulse Width: 0.5 ms
Lead Channel Sensing Intrinsic Amplitude: 12 mV
Lead Channel Sensing Intrinsic Amplitude: 2 mV
Lead Channel Setting Pacing Amplitude: 1 V
Lead Channel Setting Pacing Amplitude: 3.5 V
Lead Channel Setting Pacing Pulse Width: 0.5 ms
Lead Channel Setting Sensing Sensitivity: 2 mV
Pulse Gen Model: 2272
Pulse Gen Serial Number: 8206505

## 2023-07-08 LAB — POCT INR: INR: 2 (ref 2.0–3.0)

## 2023-07-08 NOTE — Patient Instructions (Signed)

## 2023-07-08 NOTE — Patient Instructions (Signed)
Description   Take 1.5 tablets today and continue taking Warfarin 1.5 tablets daily except 1 tablet on  Thursdays. Keep leafy veggies consistent.  Recheck INR in 4 weeks. Coumadin Clinic [phone].

## 2023-07-08 NOTE — Progress Notes (Signed)
Wound check appointment. Steri-strips removed. Wound without edema-slight redness d/t glue from steristrips. Incision edges approximated, wound well healed. Normal device function. Thresholds, sensing, and impedances consistent with implant measurements. Device programmed at 3.5V/auto capture programmed on for extra safety margin until 3 month visit. Histogram distribution appropriate for patient and level of activity. Afib burden 15%-on warfarin. Patient educated about wound care, arm mobility, lifting restrictions. ROV in 3 months with implanting physician.

## 2023-07-09 ENCOUNTER — Telehealth: Payer: Self-pay

## 2023-07-09 ENCOUNTER — Ambulatory Visit: Payer: Medicare HMO | Attending: Internal Medicine

## 2023-07-09 DIAGNOSIS — I495 Sick sinus syndrome: Secondary | ICD-10-CM

## 2023-07-09 NOTE — Progress Notes (Signed)
Pt seen in device clinic for complaints of drainage at incision site.  Site evaluated with Otilio Saber PA.  Slight clear drainage noted.  Does not appear to be infected.  Cleansed area and covered small area with 2 steristrips.  Advised Pt to continue to wash with soap and water.  Follow up appointment made next week to ensure healing.  Pt will continue to monitor and call if drainage increases, or s/s of infection or fever.

## 2023-07-09 NOTE — Telephone Encounter (Signed)
Pt called in stating she is having drainage at her wound site. Pt will send in pic to email and I have let her know a nurse will give her a call back once received

## 2023-07-09 NOTE — Patient Instructions (Signed)
See follow up appointment with device clinic.

## 2023-07-14 ENCOUNTER — Ambulatory Visit: Payer: Medicare HMO | Attending: Cardiology

## 2023-07-14 ENCOUNTER — Telehealth: Payer: Self-pay

## 2023-07-14 DIAGNOSIS — I4819 Other persistent atrial fibrillation: Secondary | ICD-10-CM

## 2023-07-14 LAB — CUP PACEART INCLINIC DEVICE CHECK
Battery Remaining Longevity: 106 mo
Battery Voltage: 3.05 V
Brady Statistic RA Percent Paced: 18 %
Brady Statistic RV Percent Paced: 78 %
Date Time Interrogation Session: 20240925202152
Implantable Lead Connection Status: 753985
Implantable Lead Connection Status: 753985
Implantable Lead Implant Date: 20240906
Implantable Lead Implant Date: 20240906
Implantable Lead Location: 753859
Implantable Lead Location: 753860
Implantable Pulse Generator Implant Date: 20240906
Lead Channel Impedance Value: 450 Ohm
Lead Channel Impedance Value: 487.5 Ohm
Lead Channel Pacing Threshold Amplitude: 1 V
Lead Channel Pacing Threshold Amplitude: 1 V
Lead Channel Pacing Threshold Pulse Width: 0.5 ms
Lead Channel Pacing Threshold Pulse Width: 0.5 ms
Lead Channel Sensing Intrinsic Amplitude: 12 mV
Lead Channel Sensing Intrinsic Amplitude: 2.7 mV
Lead Channel Setting Pacing Amplitude: 1.125
Lead Channel Setting Pacing Amplitude: 3.5 V
Lead Channel Setting Pacing Pulse Width: 0.5 ms
Lead Channel Setting Sensing Sensitivity: 2 mV
Pulse Gen Model: 2272
Pulse Gen Serial Number: 8206505

## 2023-07-14 NOTE — Progress Notes (Signed)
Patient scheduled today for re-evaluation of wound site following open area with drainage noted at last weeks clinic visit.  In addition, today patient is reporting periods of pounding, fast heart rates in recent days.  Denies any SOB or chest pain, no worsening fatigue.   Steri-strips removed from incision site, wound edges well approximated, no open or draining areas and no signs of infection.  Patient to continue to watch closely;however, no further follow up is needed for wound re-evaluation as appears well healed.   Device interrogated today given patient's acute symptoms.  Presenting Aflutter with irregular R-R VS 75-120bpm.  Device function is normal; sensing, impedance and threshold trends all stable and WNL since implant. There is noted undersensing of P waves resulting in likely under reporting of overall burden and delay in mode switches.  AT/AF Burden is 35%.  There are 87 AMS episodes all appear Aflutter with poor ventricular control.  Patient has been off of Carvedilol since admission to hospital.  Reviewed with Dr. Elberta Fortis.   PLAN AND PROGRAMMING CHANGES:  PVAB decreased from 130 to 60ms Decreased AMS detection to 150bpm Decreased AMS pacing rate from 70 to 60bpm to eliminate RV pacing   Patient restarted on Carvedilol 3.125mg  bid Referred to Afib clinic for next steps regarding Aflutter/rate control follow up

## 2023-07-14 NOTE — Patient Instructions (Signed)
Your wound has healed within normal limits.  Continue to monitor for redness, swelling, drainage from incision site or fever/chills.  Call our office at [phone] if any concerns.   You are in atrial flutter with elevated heart rates.  Continue to take your warfarin. Restart your Carvedilol 3.125mg  bid today to help with better heart rate control.   We will have the atrial fibrillation clinic reach out to you with an appointment to be seen within the next week. If you have any concerns with fast heart rates, palpitations, shortness of breath or fatigue, call our office to follow up in the meantime.   If you experience any severe symptoms: Chest pain, shortness of breath, passing out or near passing out events, call 911 and go to the ER.

## 2023-07-14 NOTE — Telephone Encounter (Signed)
Device interrogated today given patient's acute symptoms of recent pounding, fast heart rates.  Presenting Aflutter with irregular R-R VS 75-120bpm.  Device function is normal; sensing, impedance and threshold trends all stable and WNL since implant.   There is noted undersensing of P waves resulting in likely under reporting of overall burden and delay in mode switches.  AT/AF Burden is 35%.  There are 87 AMS episodes all appear Aflutter with poor ventricular control.  Patient has been off of Carvedilol since admission to hospital.  Reviewed with Dr. Elberta Fortis.   PLAN AND PROGRAMMING CHANGES:  PVAB decreased from 130 to 60ms Decreased AMS detection to 150bpm Decreased AMS pacing rate from 70 to 60bpm to eliminate RV pacing   Patient restarted on Carvedilol 3.125mg  bid Referred to Afib clinic for next steps regarding Aflutter/rate control follow up

## 2023-07-19 DIAGNOSIS — R1310 Dysphagia, unspecified: Secondary | ICD-10-CM | POA: Diagnosis not present

## 2023-07-19 DIAGNOSIS — K429 Umbilical hernia without obstruction or gangrene: Secondary | ICD-10-CM | POA: Diagnosis not present

## 2023-07-19 DIAGNOSIS — Z86010 Personal history of colonic polyps: Secondary | ICD-10-CM | POA: Diagnosis not present

## 2023-07-19 DIAGNOSIS — K219 Gastro-esophageal reflux disease without esophagitis: Secondary | ICD-10-CM | POA: Diagnosis not present

## 2023-07-20 ENCOUNTER — Ambulatory Visit (HOSPITAL_COMMUNITY)
Admission: RE | Admit: 2023-07-20 | Discharge: 2023-07-20 | Disposition: A | Discharge status: Home / Self Care | Payer: Medicare HMO | Source: Ambulatory Visit | Attending: Internal Medicine | Admitting: Internal Medicine

## 2023-07-20 VITALS — BP 130/62 | HR 60 | Ht 66.0 in | Wt 280.2 lb

## 2023-07-20 DIAGNOSIS — I10 Essential (primary) hypertension: Secondary | ICD-10-CM | POA: Diagnosis not present

## 2023-07-20 DIAGNOSIS — E669 Obesity, unspecified: Secondary | ICD-10-CM | POA: Diagnosis not present

## 2023-07-20 DIAGNOSIS — I441 Atrioventricular block, second degree: Secondary | ICD-10-CM | POA: Diagnosis not present

## 2023-07-20 DIAGNOSIS — Z950 Presence of cardiac pacemaker: Secondary | ICD-10-CM | POA: Insufficient documentation

## 2023-07-20 DIAGNOSIS — Z79899 Other long term (current) drug therapy: Secondary | ICD-10-CM | POA: Diagnosis not present

## 2023-07-20 DIAGNOSIS — Z7901 Long term (current) use of anticoagulants: Secondary | ICD-10-CM | POA: Insufficient documentation

## 2023-07-20 DIAGNOSIS — D6869 Other thrombophilia: Secondary | ICD-10-CM | POA: Diagnosis not present

## 2023-07-20 DIAGNOSIS — G4733 Obstructive sleep apnea (adult) (pediatric): Secondary | ICD-10-CM | POA: Insufficient documentation

## 2023-07-20 DIAGNOSIS — I4892 Unspecified atrial flutter: Secondary | ICD-10-CM | POA: Diagnosis not present

## 2023-07-20 DIAGNOSIS — Z6841 Body Mass Index (BMI) 40.0 and over, adult: Secondary | ICD-10-CM | POA: Diagnosis not present

## 2023-07-20 DIAGNOSIS — I484 Atypical atrial flutter: Secondary | ICD-10-CM

## 2023-07-20 MED ORDER — CARVEDILOL 3.125 MG PO TABS: 6.2500 mg | ORAL_TABLET | Freq: Two times a day (BID) | ORAL | Status: DC

## 2023-07-20 NOTE — Progress Notes (Signed)
Primary Care Physician: Jarrett Soho, PA-C Primary Cardiologist: Donato Schultz, MD Electrophysiologist: Will Jorja Loa, MD  Referring Physician: ED     Linda Washington is a 72 y.o. female with a history of HTN, T2DM, OSA, chronic diastolic heart failure, HLD, obesity, and rate-controlled atrial flutter who presents for follow up in the [facility]rial [facility]. Seen in ED on 05/16/23 for weakness, bradycardia, and SOB; patient noting at home HR 30-40s normally in the 60s. Heart rates noted in the ED as low as 19-23; she did not require emergent procedure and was discharged home. Carvedilol held per cardiology. Patient is on warfarin for a CHADS2VASC score of 4. 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  On follow up today, the device clinic received an alert for atrial flutter, rapid V rate at times. Her carvedilol was resumed. She reports that she has had atrial flutter for several years and is unaware of her arrhythmia. No bleeding issues on anticoagulation.   Today, she denies symptoms of palpitations, chest pain, shortness of breath, orthopnea, PND, lower extremity edema, dizziness, presyncope, syncope, snoring, daytime somnolence, bleeding, or neurologic sequela. The patient is tolerating medications without difficulties and is otherwise without complaint today.    Current Outpatient Medications  Medication Sig Dispense Refill   acetaminophen (TYLENOL) 650 MG CR tablet Take 650 mg by mouth every 8 (eight) hours as needed for pain.     atorvastatin (LIPITOR) 40 MG tablet Take 1 tablet (40 mg total) by mouth at bedtime. 90 tablet 3   citalopram (CELEXA) 20 MG tablet Take 20 mg by mouth daily.     Cyanocobalamin (VITAMIN B 12 PO) Take 1,000 mcg by mouth daily.     fluticasone (FLONASE) 50 MCG/ACT nasal spray Place 1-2 sprays into both nostrils daily as needed for  allergies or rhinitis.     furosemide (LASIX) 40 MG tablet TAKE 1 TABLET EVERY DAY 90 tablet 2   JARDIANCE 25 MG TABS tablet 1 tablet Orally Once a day     metFORMIN (GLUCOPHAGE) 1000 MG tablet Take 1,000 mg by mouth 2 (two) times daily with a meal.     NOVOLOG FLEXPEN 100 UNIT/ML FlexPen Inject 4 Units into the skin daily as needed. If Blood Sugar>200=Take 4 units     omeprazole (PRILOSEC) 40 MG capsule Take 40 mg by mouth daily.     oxybutynin (DITROPAN XL) 15 MG 24 hr tablet Take 15 mg by mouth daily.     Polyethyl Glycol-Propyl Glycol (SYSTANE OP) Place 1-2 drops into both eyes daily as needed (dry eyes).     TRULICITY 0.75 MG/0.5ML SOPN Inject 0.75 mg into the skin once a week. Take on Wed     warfarin (COUMADIN) 5 MG tablet TAKE AS DIRECTED BY COUMADIN CLINIC 170 tablet 3   budesonide-formoterol (SYMBICORT) 160-4.5 MCG/ACT inhaler Inhale 2 puffs into the lungs in the morning and at bedtime. (Patient not taking: Reported on 07/20/2023) 10.2 g 11   carvedilol (COREG) 3.125 MG tablet Take 2 tablets (6.25 mg total) by mouth 2 (two) times daily with a meal.     Current Facility-Administered Medications  Medication Dose Route Frequency Provider Last Rate Last Admin   betamethasone acetate-betamethasone sodium phosphate (CELESTONE) injection 3 mg  3 mg Intra-articular Once Felecia Shelling, DPM        Atrial Fibrillation Management history:  Previous  antiarrhythmic drugs: None Previous cardioversions: 06/22/2018 Previous ablations: None Anticoagulation history: warfarin   ROS- All systems are reviewed and negative except as per the HPI above.  Physical Exam: BP 130/62   Pulse 60   Ht 5\' 6"  (1.676 m)   Wt 127.1 kg   BMI 45.23 kg/m   GEN: Well nourished, well developed in no acute distress NECK: No JVD; No carotid bruits CARDIAC: Regular rate and rhythm, no murmurs, rubs, gallops RESPIRATORY:  Clear to auscultation without rales, wheezing or rhonchi  ABDOMEN: Soft, non-tender,  non-distended EXTREMITIES:  No edema; No deformity    EKG today demonstrates  V pacing with underlying atypical atrial flutter Vent. rate 60 BPM PR interval * ms QRS duration 100 ms QT/QTcB 436/436 ms   Echo 01/01/23 demonstrated   1. Left ventricular ejection fraction, by estimation, is 55 to 60%. The  left ventricle has normal function. The left ventricle has no regional  wall motion abnormalities. Left ventricular diastolic parameters were  normal.   2. Right ventricular systolic function is normal. The right ventricular  size is normal. There is mildly elevated pulmonary artery systolic  pressure.   3. Mild to moderate mitral valve regurgitation.   4. Tricuspid valve regurgitation is mild to moderate.   5. Aortic valve regurgitation is trivial.   6. The inferior vena cava is dilated in size with >50% respiratory  variability, suggesting right atrial pressure of 8 mmHg.    CHA2DS2-VASc Score = 5  The patient's score is based upon: CHF History: 0 HTN History: 1 Diabetes History: 1 Stroke History: 0 Vascular Disease History: 1 (aoritc atherosclerosis) Age Score: 1 Gender Score: 1        ASSESSMENT AND PLAN: Atrial Flutter The patient's CHA2DS2-VASc score is 5, indicating a 7.2% annual risk of stroke.   Patient in atrial flutter today. We discussed rate vs rhythm control today. Will increase carvedilol to 6.25 mg BID since she has PPM now. Will not pursue DCCV or AAD at this time since her warfarin was held for [facility] implant. Will continue to monitor burden on device. Can consider flecainide or dofetilide. BMI likely prohibitive for ablation.  Continue warfarin  Secondary Hypercoagulable State (ICD10:  D68.69) The patient is at significant risk for stroke/thromboembolism based upon her CHA2DS2-VASc Score of 5.  Continue Warfarin (Coumadin).   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  Encouraged lifestyle modification   Follow up with Gillian Shields and Canary Brim as scheduled.    Jorja Loa PA-C Afib Clinic [facility] [address] [place], [place] [address] [phone]

## 2023-07-22 ENCOUNTER — Other Ambulatory Visit: Payer: Self-pay | Admitting: Internal Medicine

## 2023-07-22 DIAGNOSIS — I4892 Unspecified atrial flutter: Secondary | ICD-10-CM

## 2023-07-22 NOTE — Telephone Encounter (Signed)
Refill request for warfarin:  Last INR was 2.0 on 07/08/23 Next INR due 08/05/23 LOV was 07/20/23  Refill approved.

## 2023-07-27 ENCOUNTER — Ambulatory Visit (INDEPENDENT_AMBULATORY_CARE_PROVIDER_SITE_OTHER): Payer: Medicare HMO | Admitting: Podiatry

## 2023-07-27 ENCOUNTER — Encounter: Payer: Self-pay | Admitting: Podiatry

## 2023-07-27 DIAGNOSIS — E119 Type 2 diabetes mellitus without complications: Secondary | ICD-10-CM

## 2023-07-27 DIAGNOSIS — B351 Tinea unguium: Secondary | ICD-10-CM | POA: Diagnosis not present

## 2023-07-27 DIAGNOSIS — M79675 Pain in left toe(s): Secondary | ICD-10-CM

## 2023-07-27 NOTE — Progress Notes (Signed)
This patient returns to my office for at risk foot care.  This patient requires this care by a professional since this patient will be at risk due to having diabetes mellitus.and coagulation defect.   This patient is unable to cut nails herself since the patient cannot reach her nails.These nails are painful walking and wearing shoes.  This patient presents for at risk foot care today. ° °General Appearance  Alert, conversant and in no acute stress. ° °Vascular  Dorsalis pedis and posterior tibial  pulses are weakly  palpable  bilaterally.  Capillary return is within normal limits  bilaterally. Temperature is within normal limits  bilaterally. ° °Neurologic  Senn-Weinstein monofilament wire test diminished   bilaterally. Muscle power within normal limits bilaterally. ° °Nails Thick disfigured discolored nails with subungual debris  Hallux nails. No evidence of bacterial infection or drainage bilaterally. ° °Orthopedic  No limitations of motion  feet .  No crepitus or effusions noted.  HAV  B/L.  Hammer toes  B/L.  Haglunds deformity right foot.  Plantar flexed fifth metatarsal  B/L. ° °Skin  normotropic skin  noted bilaterally.  No signs of infections or ulcers noted.   Porokeratosis sub 5th met  B/L asymptomatic. ° °Onychomycosis  Pain in right toes  Pain in left toes   ° °Consent was obtained for treatment procedures.   Mechanical debridement of nails 1-5  bilaterally performed with a nail nipper.  Filed with dremel without incident. ° ° °Return office visit    3 months                  Told patient to return for periodic foot care and evaluation due to potential at risk complications. ° ° °Linda Washington DPM  °

## 2023-08-05 ENCOUNTER — Ambulatory Visit: Payer: Medicare HMO

## 2023-08-09 ENCOUNTER — Ambulatory Visit: Payer: Medicare HMO | Attending: Cardiology | Admitting: *Deleted

## 2023-08-09 DIAGNOSIS — Z5181 Encounter for therapeutic drug level monitoring: Secondary | ICD-10-CM

## 2023-08-09 DIAGNOSIS — I4892 Unspecified atrial flutter: Secondary | ICD-10-CM | POA: Diagnosis not present

## 2023-08-09 LAB — POCT INR: POC INR: 2.3

## 2023-08-09 NOTE — Patient Instructions (Signed)
Description   Continue taking Warfarin 1.5 tablets daily except 1 tablet on  Thursdays. Keep leafy veggies consistent.  Recheck INR in 5 weeks. Coumadin Clinic [phone].

## 2023-08-19 DIAGNOSIS — M65331 Trigger finger, right middle finger: Secondary | ICD-10-CM | POA: Diagnosis not present

## 2023-08-19 DIAGNOSIS — M65321 Trigger finger, right index finger: Secondary | ICD-10-CM | POA: Diagnosis not present

## 2023-08-23 ENCOUNTER — Encounter (HOSPITAL_BASED_OUTPATIENT_CLINIC_OR_DEPARTMENT_OTHER): Payer: Self-pay | Admitting: Family

## 2023-08-23 ENCOUNTER — Ambulatory Visit (HOSPITAL_BASED_OUTPATIENT_CLINIC_OR_DEPARTMENT_OTHER): Payer: Medicare HMO | Admitting: Family

## 2023-08-23 VITALS — BP 124/74 | HR 77 | Temp 97.3°F | Resp 20 | Ht 66.0 in | Wt 285.8 lb

## 2023-08-23 DIAGNOSIS — I10 Essential (primary) hypertension: Secondary | ICD-10-CM | POA: Diagnosis not present

## 2023-08-23 DIAGNOSIS — I4819 Other persistent atrial fibrillation: Secondary | ICD-10-CM | POA: Diagnosis not present

## 2023-08-23 DIAGNOSIS — Z950 Presence of cardiac pacemaker: Secondary | ICD-10-CM

## 2023-08-23 DIAGNOSIS — D6859 Other primary thrombophilia: Secondary | ICD-10-CM

## 2023-08-23 NOTE — Patient Instructions (Signed)
Medication Instructions:  Your physician recommends that you continue on your current medications as directed. Please refer to the Current Medication list given to you today.   Follow-Up: At [facility], you and your health needs are our priority.  As part of our continuing mission to provide you with exceptional heart care, we have created designated Provider Care Teams.  These Care Teams include your primary Cardiologist (physician) and Advanced Practice Providers (APPs -  Physician Assistants and Nurse Practitioners) who all work together to provide you with the care you need, when you need it.  We recommend signing up for the patient portal called "MyChart".  Sign up information is provided on this After Visit Summary.  MyChart is used to connect with patients for Virtual Visits (Telemedicine).  Patients are able to view lab/test results, encounter notes, upcoming appointments, etc.  Non-urgent messages can be sent to your provider as well.   To learn more about what you can do with MyChart, go to [email or web address].    Your next appointment:   6 months with Dr. Anne Fu

## 2023-08-23 NOTE — Progress Notes (Signed)
Cardiology Office Note:  .   Date:  08/23/2023  ID:  Linda Washington, DOB July 09, 1951, MRN [id number] PCP: Jarrett Soho, PA-C  [facility] Providers Cardiologist:  Donato Schultz, MD Electrophysiologist:  Will Jorja Loa, MD    History of Present Illness: .   Linda Washington is a 72 y.o. female  with a hx of persistent atrial flutter on chronic anticoagulation, SSS s/p PPM, chronic diastolic heart failure OSA, obesity, hyperlipidemia. Family history notable for heart failure in both parents.   Previously underwent TEE cardioversion but then quickly reverted back to atrial flutter the next day and has since been maintained with rate control.  Prior cardiac catheterization in 2018 with normal coronary arteries and normal LVEF.  She did have significant fall November 2022 with left rib fractures and hemothorax.   She saw Dr. Anne Fu 04/27/2022 doing overall well.  Coumadin was continued (previously on Xarelto). No changes were made at that time.   Last seen 12/04/2022 and since last seen had had pneumonia, upper respiratory infection, bronchitis, thrush.  She noted more exertional dyspnea for a couple of months.  BMP revealed no significant volume overload and anemia was much improved from previous with hemoglobin of 12.5.  Recommended for updated echocardiogram performed 01/01/2023 with normal LVEF 55 to 60%, normal diastolic parameters, mildly elevated PASP, mild to moderate MR/TR, trivial AI, right atrial pressure 8 mmHg.   She saw Dr. Isaiah Serge of pulmonology 12/24/2022.  She was provided prednisone 40 mg x 5 days.  CT chest ordered due to concern for reactive airway disease/asthma given history of seasonal allergies but not yet performed.  There was also concern her atrial fibrillation may be playing a role in her dyspnea.   Seen 01/05/23 shortly after her sister's passing. Furosemide increased to BID x 3 days due to volume overload. Jardiance 10mg  daily initiated.   ED visit 05/16/23  with bradycardia, Coreg discontinued, potassium PO provided for K 3.3. She saw Afib clinic 05/24/23 with monitor placed. Saw Dr. Elberta Fortis 06/18/23 with intermittent SSS, junctional bradycardia, up to 4 second pauses. PPM was placed 06/25/23.  Carvedilol later resumed and increased due to rapid atrial flutter.   She presents today for follow-up. Pleasant lady who lives with her son. Cooking predominantly at home. Plans to resume PREP exercise program. Has been taking Lasix PRN about 1-2x per month for LE edema. Exertional dyspnea is mild and not bothersome. No chest pain, edema, orthopnea, PND. Notes did not tolerate CPAP but is sleeping well.   ROS: Please see the history of present illness.    All other systems reviewed and are negative.   Studies Reviewed: .        Cardiac Studies & Procedures   CARDIAC CATHETERIZATION  CARDIAC CATHETERIZATION 02/01/2017  Narrative Images from the original result were not included.   The left ventricular systolic function is normal.  LV end diastolic pressure is normal.  The left ventricular ejection fraction is 55-65% by visual estimate.  Linda Washington is a 72 y.o. female   [id number] LOCATION:  FACILITY: [facility] PHYSICIAN: Nanetta Batty, M.D. 1951/06/17   DATE OF PROCEDURE:  02/01/2017  DATE OF DISCHARGE:     CARDIAC CATHETERIZATION    History obtained from chart review. Ms. Hanratty is a 72 year old mildly overweight Caucasian female with positive cardiac risk factors including hypertension, hyperlipidemia, type 2 diabetes, tobacco abuse and family history. She was admitted with unstable angina. Enzymes are negative. Her EKG shows no acute changes. She was referred  for cardiac catheterization to define her anatomy and rule out an ischemic etiology.  Impression Ms. Delaine has normal coronary arteries and normal LV function. I believe her chest pain is noncardiac. The sheath was removed and a TR band was placed on the right wrist to achieve patent  hemostasis. The patient left the lab in stable condition. She'll be discharged home later today with outpatient follow-up.  Nanetta Batty. MD, [facility] 02/01/2017 2:24 PM  Findings Coronary Findings Diagnostic  Dominance: Right  No diagnostic findings have been documented. Intervention  No interventions have been documented.     ECHOCARDIOGRAM  ECHOCARDIOGRAM COMPLETE 01/01/2023  Narrative ECHOCARDIOGRAM REPORT    Patient Name:   Linda Washington Dark  Date of Exam: 01/01/2023 Medical Rec #:  [id number]     Height:       66.0 in Accession #:    [id number]    Weight:       300.8 lb Date of Birth:  [birth year]     BSA:          2.379 m Patient Age:    72 years      BP:           124/72 mmHg Patient Gender: F             HR:           62 bpm. Exam Location:  [address]  Procedure: 2D Echo, Cardiac Doppler and Color Doppler  Indications:    Dyspnea R06.00  History:        Patient has prior history of Echocardiogram examinations, most recent 06/22/2018. CHF, Signs/Symptoms:Dyspnea; Risk Factors:Dyslipidemia, Hypertension, GERD, Diabetes and Sleep Apnea.  Sonographer:    Eulah Pont RDCS Referring Phys: [id number] Chrystel Barefield S Geniene List  IMPRESSIONS   1. Left ventricular ejection fraction, by estimation, is 55 to 60%. The left ventricle has normal function. The left ventricle has no regional wall motion abnormalities. Left ventricular diastolic parameters were normal. 2. Right ventricular systolic function is normal. The right ventricular size is normal. There is mildly elevated pulmonary artery systolic pressure. 3. Mild to moderate mitral valve regurgitation. 4. Tricuspid valve regurgitation is mild to moderate. 5. Aortic valve regurgitation is trivial. 6. The inferior vena cava is dilated in size with >50% respiratory variability, suggesting right atrial pressure of 8 mmHg.  FINDINGS Left Ventricle: Left ventricular ejection fraction, by estimation, is 55 to 60%. The left ventricle  has normal function. The left ventricle has no regional wall motion abnormalities. The left ventricular internal cavity size was normal in size. There is no left ventricular hypertrophy. Left ventricular diastolic parameters were normal.  Right Ventricle: The right ventricular size is normal. Right ventricular systolic function is normal. There is mildly elevated pulmonary artery systolic pressure. The tricuspid regurgitant velocity is 3.04 m/s, and with an assumed right atrial pressure of 8 mmHg, the estimated right ventricular systolic pressure is 45.0 mmHg.  Left Atrium: Left atrial size was normal in size.  Right Atrium: Right atrial size was normal in size.  Pericardium: There is no evidence of pericardial effusion.  Mitral Valve: Mild to moderate mitral valve regurgitation.  Tricuspid Valve: Tricuspid valve regurgitation is mild to moderate.  Aortic Valve: Aortic valve regurgitation is trivial. Aortic regurgitation PHT measures 568 msec.  Pulmonic Valve: Pulmonic valve regurgitation is not visualized.  Aorta: The aortic root and ascending aorta are structurally normal, with no evidence of dilitation.  Venous: The inferior vena cava is dilated in size with greater than 50%  respiratory variability, suggesting right atrial pressure of 8 mmHg.  IAS/Shunts: No atrial level shunt detected by color flow Doppler.   LEFT VENTRICLE PLAX 2D LVIDd:         4.60 cm      Diastology LVIDs:         3.50 cm      LV e' medial:    10.30 cm/s LV PW:         0.90 cm      LV E/e' medial:  10.1 LV IVS:        0.90 cm      LV e' lateral:   14.30 cm/s LVOT diam:     1.90 cm      LV E/e' lateral: 7.3 LV SV:         66 LV SV Index:   28 LVOT Area:     2.84 cm  LV Volumes (MOD) LV vol d, MOD A2C: 87.2 ml LV vol d, MOD A4C: 121.0 ml LV vol s, MOD A2C: 39.8 ml LV vol s, MOD A4C: 51.0 ml LV SV MOD A2C:     47.4 ml LV SV MOD A4C:     121.0 ml LV SV MOD BP:      63.2 ml  RIGHT VENTRICLE RV S  prime:     14.10 cm/s TAPSE (M-mode): 1.6 cm  LEFT ATRIUM             Index        RIGHT ATRIUM           Index LA diam:        5.00 cm 2.10 cm/m   RA Area:     33.50 cm LA Vol (A2C):   71.0 ml 29.85 ml/m  RA Volume:   121.00 ml 50.87 ml/m LA Vol (A4C):   54.0 ml 22.70 ml/m LA Biplane Vol: 62.8 ml 26.40 ml/m AORTIC VALVE LVOT Vmax:         98.50 cm/s LVOT Vmean:        68.700 cm/s LVOT VTI:          0.233 m AI PHT:            568 msec AR Vena Contracta: 0.20 cm  AORTA Ao Root diam: 3.00 cm Ao Asc diam:  3.60 cm  MITRAL VALVE                TRICUSPID VALVE MV Area (PHT): 3.48 cm     TR Peak grad:   37.0 mmHg MV Decel Time: 218 msec     TR Vmax:        304.00 cm/s MV E velocity: 104.00 cm/s MV A velocity: 41.90 cm/s   SHUNTS MV E/A ratio:  2.48         Systemic VTI:  0.23 m Systemic Diam: 1.90 cm  Carolan Clines Electronically signed by Carolan Clines Signature Date/Time: 01/01/2023/4:16:01 PM    Final   TEE  ECHO TEE 06/22/2018  Narrative *[place]* *[facility]* [address] [place], [place] [address] [phone]  ------------------------------------------------------------------- Transesophageal Echocardiography with Cardioversion  Patient:    Letina, Luckett MR #:       [id number] Study Date: 06/22/2018 Gender:     F Age:        55 Height:     172.7 cm Weight:     134.9 kg BSA:        2.61 m^2 Pt. Status: Room:  3E10C  ADMITTING    Jonah Blue SONOGRAPHER  Perley Jain, RDCS ATTENDING    Elease Etienne PERFORMING   Cristal Deer, [address] Ovidio Kin. REFERRING    Kroeger, Dot Lanes M.  cc:  ------------------------------------------------------------------- LV EF: 50% -   55%  ------------------------------------------------------------------- Indications:      Atrial fibrillation - 427.31.  ------------------------------------------------------------------- History:   PMH:   Atrial  fibrillation.  ------------------------------------------------------------------- Study Conclusions  - Left ventricle: Systolic function was normal. The estimated ejection fraction was in the range of 50% to 55%. Wall motion was normal; there were no regional wall motion abnormalities. No evidence of thrombus. - Aortic valve: There was mild regurgitation originating from the commissure between the left coronary and noncoronary cusps. - Mitral valve: There was mild regurgitation directed centrally. - Left atrium: No evidence of thrombus in the atrial cavity or appendage. - Right atrium: No evidence of thrombus in the atrial cavity or appendage. - Atrial septum: A patent foramen ovale cannot be excluded. Trivial color flow noted at FO/intra-atrial septum. - Tricuspid valve: There was mild-moderate regurgitation. - Pulmonic valve: There was trivial regurgitation.  Impressions:  - Successful cardioversion. LAA and RA/RAA thoroughly interrogated, due to muscular LAA and IAS shadowing into RA/RAA. No cardiac source of emboli was indentified.  ------------------------------------------------------------------- Study data:   Study status:  Routine.  Consent:  The risks, benefits, and alternatives to the procedure were explained to the patient and informed consent was obtained.  Procedure:  The patient reported no pain pre or post test. Initial setup. The patient was brought to the laboratory in the fasting state. A baseline ECG was recorded. Intravenous access was obtained. Surface ECG leads and pulse oximetric signals were monitored. Self-adhesive anterior-posterior defibrillation pads were applied. Sedation. Moderate sedation with intermittent deep sedation was administered during cardioversion by anesthesiology staff. Transesophageal echocardiography. Topical anesthesia was obtained using viscous lidocaine. An adult multiplane transesophageal probe was inserted by the attending  cardiologistwithout difficulty. Image quality was adequate. Images were captured in a quad screen format to simplify data comparison. No intracardiac thrombus was identified. Cardioversion. The rhythm was successfully converted from atrial fibrillation to normal sinus rhythm, using 1synchronized shocks with a maximum energy of 120 J.  Study completion:  All IVs inserted during the procedure were removed. The patient tolerated the procedure well. There were no complications.  Administered medications:   Propofol.          Transesophageal echocardiography with cardioversion.  2D and intravenous contrast injection. Birthdate:  Patient birthdate: 1951/06/17.  Age:  Patient is 72 yr old.  Sex:  Gender: female.    BMI: 45.2 kg/m^2.  Blood pressure: 118/62  Patient status:  Inpatient.  Study date:  Study date: 06/22/2018. Study time: 10:31 AM.  Location:  Endoscopy.  -------------------------------------------------------------------  ------------------------------------------------------------------- Left ventricle:  Systolic function was normal. The estimated ejection fraction was in the range of 50% to 55%. Wall motion was normal; there were no regional wall motion abnormalities.  No evidence of thrombus.  ------------------------------------------------------------------- Aortic valve:   Trileaflet; normal thickness, noncalcified leaflets. Cusp separation was normal. Mobility was not restricted. Doppler:  There was mild regurgitation originating from the commissure between the left coronary and noncoronary cusps.  ------------------------------------------------------------------- Aorta:  There was mild atheromatous plaque.  ------------------------------------------------------------------- Mitral valve:   Structurally normal valve. Normal thickness leaflets . Leaflet separation was normal. Mobility was not restricted.  Doppler:  There was mild regurgitation  directed centrally.  ------------------------------------------------------------------- Left atrium:  The  atrium was normal in size.  No evidence of thrombus in the atrial cavity or appendage. The appendage was well visualized and morphologically a left appendage.  ------------------------------------------------------------------- Atrial septum:  A patent foramen ovale cannot be excluded. Trivial color flow noted at FO/intra-atrial septum.  ------------------------------------------------------------------- Right ventricle:  Well visualized. The cavity size was normal. Systolic function was normal.  ------------------------------------------------------------------- Pulmonic valve:    Structurally normal valve.    Doppler:  There was trivial regurgitation.  ------------------------------------------------------------------- Tricuspid valve:  Well visualized.  Structurally normal valve. Doppler:  There was mild-moderate regurgitation.  ------------------------------------------------------------------- Right atrium:  Well visualized. The atrium was normal in size.  No evidence of thrombus in the atrial cavity or appendage.  ------------------------------------------------------------------- Pericardium:  There was no pericardial effusion.  ------------------------------------------------------------------- Measurements  Tricuspid valve                      Value Tricuspid regurg peak velocity       230   cm/s Tricuspid peak RV-RA gradient        21    mm Hg  Legend: (L)  and  (H)  mark values outside specified reference range.  ------------------------------------------------------------------- Prepared and Electronically Authenticated by  Jodelle Red 2019-09-04T14:24:58   MONITORS  LONG TERM MONITOR (3-14 DAYS) 06/15/2023  Narrative Patch Wear Time:  13 days and 14 hours  Patient had a min HR of 23 bpm, max HR of 203 bpm, and avg HR of 53  bpm. Predominant underlying rhythm was Sinus Rhythm. 5 Ventricular tachycardia runs occurred, the fastest interval lasting 16 beats with a max rate of 203 bpm, longest 15.4 secs with an avg rate of 148 bpm. 613 Pauses occurred, the longest lasting 4.4 secs (14 bpm). Second Degree AV Block-Mobitz I was present. Junctional Rhythm was present. Triggered episodes associated with junctional rhythm and sinus rhythm 1.9% supraventricular ectopy <1% ventricular ectopy  Will Camnitz, MD           Risk Assessment/Calculations:    CHA2DS2-VASc Score = 5   This indicates a 7.2% annual risk of stroke. The patient's score is based upon: CHF History: 0 HTN History: 1 Diabetes History: 1 Stroke History: 0 Vascular Disease History: 1 (aoritc atherosclerosis) Age Score: 1 Gender Score: 1            Physical Exam:   VS:  BP 124/74 (BP Location: Right Arm, Patient Position: Sitting, Cuff Size: Normal)   Pulse 77   Temp (!) 97.3 F (36.3 C) (Temporal)   Resp 20   Ht 5\' 6"  (1.676 m)   Wt 285 lb 12.8 oz (129.6 kg)   SpO2 99%   BMI 46.13 kg/m    Wt Readings from Last 3 Encounters:  08/23/23 285 lb 12.8 oz (129.6 kg)  07/20/23 280 lb 3.2 oz (127.1 kg)  06/25/23 282 lb (127.9 kg)    GEN: Well nourished, well developed in no acute distress NECK: No JVD; No carotid bruits CARDIAC: RRR, no murmurs, rubs, gallops RESPIRATORY:  Clear to auscultation without rales, wheezing or rhonchi  ABDOMEN: Soft, non-tender, non-distended EXTREMITIES:  No edema; No deformity   ASSESSMENT AND PLAN: .    Persistent atrial flutter/hypercoagulable state/ s/p PPM - Follows with EP. RRR on auscultation. Tolerating Coreg 6.25mg  BID (using up her 3.125mg  tablets by taking two twice per day). CHA2DS2-VASc Score = 5 [CHF History: 0, HTN History: 1, Diabetes History: 1, Stroke History: 0, Vascular Disease History: 1 (aoritc atherosclerosis), Age Score: 1, Gender Score:  1].  Therefore, the patient's annual risk of  stroke is 7.2 %.    Continue Warfarin, denies bleeding complications.   Chronic diastolic heart failure- Volume status difficult to determine with body habitus but overall euvolemic. NYHA II with mild dyspnea with more than usual activities. Echo 01/01/23 normal LVEF, normal diastolic function, mild to moderate MR/TR, mild AR, mildly elevated PASP, RA pressure . Continue GDMT Jardiance, Lasix PRN.  BMP in 1-2 weeks for monitoring.   Chronic bronchitis-follows with Dr. De Hollingshead.     HTN - BP well controlled. Continue current antihypertensive regimen.     Hyperlipidemia-  Continue Atorvastatin 40mg  QD. Has upcoming labs with PCP.    OSA- Does not tolerate CPAP. Not candidate for Inspire due to BMI.   Obesity / BMI 46 - Weight loss via diet and exercise encouraged. Discussed the impact being overweight would have on cardiovascular risk.  Plans to resume PREP program.          Dispo: follow up as scheduled with EP and in 6 months with Dr. Anne Fu  Signed, Alver Sorrow, NP

## 2023-08-26 ENCOUNTER — Telehealth: Payer: Self-pay

## 2023-08-26 NOTE — Telephone Encounter (Signed)
Alert remote transmission: Continued persistent AF Sent to triage due to persistent AF, on OAC, AF burden is 77% of the time, AF ongoing since 07/20/2023  Noted increased burden with elevated V rates since last appt with AF clinic.  Will forward to R. Fenton PA and team for follow up.

## 2023-08-27 NOTE — Telephone Encounter (Signed)
Left message to discuss

## 2023-08-27 NOTE — Telephone Encounter (Signed)
Patient states she feels great and is having no symptoms. She would prefer to keep her scheduled appt next month as she does not want a change in her plan at this point. Pt instructed to call should symptoms worsen before appt due in December. Pt in agreement.

## 2023-09-06 DIAGNOSIS — Z85828 Personal history of other malignant neoplasm of skin: Secondary | ICD-10-CM | POA: Diagnosis not present

## 2023-09-06 DIAGNOSIS — Z6841 Body Mass Index (BMI) 40.0 and over, adult: Secondary | ICD-10-CM | POA: Diagnosis not present

## 2023-09-06 DIAGNOSIS — Z0000 Encounter for general adult medical examination without abnormal findings: Secondary | ICD-10-CM | POA: Diagnosis not present

## 2023-09-06 DIAGNOSIS — Z1159 Encounter for screening for other viral diseases: Secondary | ICD-10-CM | POA: Diagnosis not present

## 2023-09-06 DIAGNOSIS — E1142 Type 2 diabetes mellitus with diabetic polyneuropathy: Secondary | ICD-10-CM | POA: Diagnosis not present

## 2023-09-06 DIAGNOSIS — M47816 Spondylosis without myelopathy or radiculopathy, lumbar region: Secondary | ICD-10-CM | POA: Diagnosis not present

## 2023-09-06 DIAGNOSIS — Z1211 Encounter for screening for malignant neoplasm of colon: Secondary | ICD-10-CM | POA: Diagnosis not present

## 2023-09-06 DIAGNOSIS — Z1331 Encounter for screening for depression: Secondary | ICD-10-CM | POA: Diagnosis not present

## 2023-09-06 DIAGNOSIS — D6869 Other thrombophilia: Secondary | ICD-10-CM | POA: Diagnosis not present

## 2023-09-06 DIAGNOSIS — I4819 Other persistent atrial fibrillation: Secondary | ICD-10-CM | POA: Diagnosis not present

## 2023-09-06 DIAGNOSIS — E7800 Pure hypercholesterolemia, unspecified: Secondary | ICD-10-CM | POA: Diagnosis not present

## 2023-09-06 DIAGNOSIS — F418 Other specified anxiety disorders: Secondary | ICD-10-CM | POA: Diagnosis not present

## 2023-09-13 ENCOUNTER — Ambulatory Visit: Payer: Medicare HMO | Attending: Cardiology | Admitting: *Deleted

## 2023-09-13 DIAGNOSIS — Z5181 Encounter for therapeutic drug level monitoring: Secondary | ICD-10-CM | POA: Diagnosis not present

## 2023-09-13 DIAGNOSIS — I4892 Unspecified atrial flutter: Secondary | ICD-10-CM

## 2023-09-13 LAB — POCT INR: INR: 3.4 — AB (ref 2.0–3.0)

## 2023-09-13 NOTE — Patient Instructions (Signed)
Description   Do not take any warfarin today then continue taking Warfarin 1.5 tablets daily except 1 tablet on Thursdays. Keep leafy veggies consistent.  Recheck INR in 5 weeks. Coumadin Clinic [phone].

## 2023-09-22 DIAGNOSIS — E1142 Type 2 diabetes mellitus with diabetic polyneuropathy: Secondary | ICD-10-CM | POA: Diagnosis not present

## 2023-09-23 ENCOUNTER — Ambulatory Visit
Admission: RE | Admit: 2023-09-23 | Discharge: 2023-09-23 | Disposition: A | Discharge status: Home / Self Care | Payer: Medicare HMO | Source: Ambulatory Visit | Attending: Pulmonary Disease | Admitting: Pulmonary Disease

## 2023-09-23 DIAGNOSIS — R0602 Shortness of breath: Secondary | ICD-10-CM | POA: Diagnosis not present

## 2023-09-23 DIAGNOSIS — R911 Solitary pulmonary nodule: Secondary | ICD-10-CM

## 2023-09-23 DIAGNOSIS — R059 Cough, unspecified: Secondary | ICD-10-CM | POA: Diagnosis not present

## 2023-09-23 DIAGNOSIS — I700 Atherosclerosis of aorta: Secondary | ICD-10-CM | POA: Diagnosis not present

## 2023-09-24 DIAGNOSIS — Z1211 Encounter for screening for malignant neoplasm of colon: Secondary | ICD-10-CM | POA: Diagnosis not present

## 2023-09-24 DIAGNOSIS — Z1212 Encounter for screening for malignant neoplasm of rectum: Secondary | ICD-10-CM | POA: Diagnosis not present

## 2023-09-27 DIAGNOSIS — Z1211 Encounter for screening for malignant neoplasm of colon: Secondary | ICD-10-CM | POA: Diagnosis not present

## 2023-09-27 NOTE — Progress Notes (Signed)
Electrophysiology Office Note:   Date:  09/27/2023  ID:  Linda Washington, DOB 15-Jan-1951, MRN [id number]  Primary Cardiologist: Donato Schultz, MD Electrophysiologist: Will Jorja Loa, MD   {Click to update primary MD,subspecialty MD or APP then REFRESH:1}    History of Present Illness:   Linda Washington is a 72 y.o. female with h/o persistent AFL, SSS s/p PPM, chronic dCHF, HTN, OSA, obesity seen today for routine electrophysiology followup.   Since last being seen in our clinic the patient reports doing ***.  she denies chest pain, palpitations, dyspnea, PND, orthopnea, nausea, vomiting, dizziness, syncope, edema, weight gain, or early satiety.   Review of systems complete and found to be negative unless listed in HPI.   EP Information / Studies Reviewed:    {EKGtoday:28818}      PPM Interrogation-  reviewed in detail today,  See PACEART report.  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 Predominant underlying rhythm was SR. 5 VT runs > fastest interval lasting 16 beats with max rate of 203 bpm, longest 15.4 sec with ave rate of 148 bpm.  613 pauses with longest lasting 4.4 sec.  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  If not, click here to update score.  REFRESH note.  :1} This indicates a 7.2% annual risk of stroke. The patient's score is based upon: CHF History: 0 HTN History: 1 Diabetes History: 1 Stroke History: 0 Vascular Disease History: 1 (aoritc atherosclerosis) Age Score: 1 Gender Score: 1   {This patient  has a significant risk of stroke if diagnosed with atrial fibrillation.  Please consider VKA or DOAC agent for anticoagulation if the bleeding risk is acceptable.   You can also use the SmartPhrase .[email or web address] for documentation.   :[id number]} No BP recorded.  {Refresh Note OR Click here to enter BP  :1}***        Physical Exam:   VS:  There were no vitals taken for this visit.   Wt Readings from Last 3 Encounters:  08/23/23 285 lb 12.8 oz (129.6 kg)  07/20/23 280 lb 3.2 oz (127.1 kg)  06/25/23 282 lb (127.9 kg)     GEN: Well nourished, well developed in no acute distress NECK: No JVD; No carotid bruits CARDIAC: {EPRHYTHM:28826}, no murmurs, rubs, gallops. PPM site well healed, no edema, erythema or tethering *** RESPIRATORY:  Clear to auscultation without rales, wheezing or rhonchi  ABDOMEN: Soft, non-tender, non-distended EXTREMITIES:  No edema; No deformity   ASSESSMENT AND PLAN:    SND s/p Abbott PPM  -Normal PPM function -See Pace Art report -No changes today -site well healed ***  Atrial Flutter  -controlled ventricular response ***  -if V rates not controlled, consider flecainide or dofetilitde  -continue Coreg 6.25mg  BID  -BMI likely precludes ablation   Secondary Hypercoagulable State  -continue warfarin, dosing per Coumadin Clinic  OSA  -CPAP compliance encouraged  Disposition:   Follow up with {EPPROVIDERS:28135} {EPFOLLOW SA:63016}  Signed, Canary Brim, MSN, APRN, NP-C, AGACNP-BC [facility] - Electrophysiology  09/27/2023, 8:05 AM

## 2023-09-28 ENCOUNTER — Encounter: Payer: Self-pay | Admitting: Pulmonary Disease

## 2023-09-28 ENCOUNTER — Ambulatory Visit (INDEPENDENT_AMBULATORY_CARE_PROVIDER_SITE_OTHER): Payer: Medicare HMO

## 2023-09-28 ENCOUNTER — Ambulatory Visit: Payer: Medicare HMO | Attending: Pulmonary Disease | Admitting: Pulmonary Disease

## 2023-09-28 VITALS — BP 122/72 | HR 60 | Ht 66.0 in | Wt 289.2 lb

## 2023-09-28 DIAGNOSIS — I4892 Unspecified atrial flutter: Secondary | ICD-10-CM | POA: Diagnosis not present

## 2023-09-28 DIAGNOSIS — Z950 Presence of cardiac pacemaker: Secondary | ICD-10-CM

## 2023-09-28 DIAGNOSIS — I495 Sick sinus syndrome: Secondary | ICD-10-CM

## 2023-09-28 DIAGNOSIS — D6869 Other thrombophilia: Secondary | ICD-10-CM | POA: Diagnosis not present

## 2023-09-28 NOTE — Patient Instructions (Addendum)
Medication Instructions:   Your physician recommends that you continue on your current medications as directed. Please refer to the Current Medication list given to you today.   *If you need a refill on your cardiac medications before your next appointment, please call your pharmacy*   Lab Work:  NONE ORDERED  TODAY    If you have labs (blood work) drawn today and your tests are completely normal, you will receive your results only by: MyChart Message (if you have MyChart) OR A paper copy in the mail If you have any lab test that is abnormal or we need to change your treatment, we will call you to review the results.   Testing/Procedures: NONE ORDERED  TODAY      Follow-Up: At [facility], you and your health needs are our priority.  As part of our continuing mission to provide you with exceptional heart care, we have created designated Provider Care Teams.  These Care Teams include your primary Cardiologist (physician) and Advanced Practice Providers (APPs -  Physician Assistants and Nurse Practitioners) who all work together to provide you with the care you need, when you need it.  We recommend signing up for the patient portal called "MyChart".  Sign up information is provided on this After Visit Summary.  MyChart is used to connect with patients for Virtual Visits (Telemedicine).  Patients are able to view lab/test results, encounter notes, upcoming appointments, etc.  Non-urgent messages can be sent to your provider as well.   To learn more about what you can do with MyChart, go to [email or web address].    Your next appointment:  FIRST WEEK JANUARY AF[facility] CLINIC                             AND    1 year(s)  Provider: CAMNITZ/ OLLIS       Other Instructions

## 2023-09-29 LAB — CUP PACEART REMOTE DEVICE CHECK
Battery Remaining Longevity: 104 mo
Battery Remaining Percentage: 95.5 %
Battery Voltage: 3.02 V
Brady Statistic AP VP Percent: 20 %
Brady Statistic AP VS Percent: 33 %
Brady Statistic AS VP Percent: 34 %
Brady Statistic AS VS Percent: 12 %
Brady Statistic RA Percent Paced: 6.3 %
Brady Statistic RV Percent Paced: 62 %
Date Time Interrogation Session: 20241210020015
Implantable Lead Connection Status: 753985
Implantable Lead Connection Status: 753985
Implantable Lead Implant Date: 20240906
Implantable Lead Implant Date: 20240906
Implantable Lead Location: 753859
Implantable Lead Location: 753860
Implantable Pulse Generator Implant Date: 20240906
Lead Channel Impedance Value: 430 Ohm
Lead Channel Impedance Value: 450 Ohm
Lead Channel Pacing Threshold Amplitude: 0.5 V
Lead Channel Pacing Threshold Amplitude: 0.75 V
Lead Channel Pacing Threshold Pulse Width: 0.5 ms
Lead Channel Pacing Threshold Pulse Width: 0.5 ms
Lead Channel Sensing Intrinsic Amplitude: 12 mV
Lead Channel Sensing Intrinsic Amplitude: 2.8 mV
Lead Channel Setting Pacing Amplitude: 1 V
Lead Channel Setting Pacing Amplitude: 3.5 V
Lead Channel Setting Pacing Pulse Width: 0.5 ms
Lead Channel Setting Sensing Sensitivity: 2 mV
Pulse Gen Model: 2272
Pulse Gen Serial Number: 8206505

## 2023-09-30 DIAGNOSIS — M65331 Trigger finger, right middle finger: Secondary | ICD-10-CM | POA: Diagnosis not present

## 2023-09-30 DIAGNOSIS — M65332 Trigger finger, left middle finger: Secondary | ICD-10-CM | POA: Diagnosis not present

## 2023-10-03 LAB — COLOGUARD: COLOGUARD: NEGATIVE

## 2023-10-18 ENCOUNTER — Ambulatory Visit: Payer: Medicare HMO | Attending: Internal Medicine

## 2023-10-18 DIAGNOSIS — Z5181 Encounter for therapeutic drug level monitoring: Secondary | ICD-10-CM

## 2023-10-18 DIAGNOSIS — I4892 Unspecified atrial flutter: Secondary | ICD-10-CM

## 2023-10-18 LAB — POCT INR: INR: 2.2 (ref 2.0–3.0)

## 2023-10-18 NOTE — Patient Instructions (Signed)
continue taking Warfarin 1.5 tablets daily except 1 tablet on Thursdays. Keep leafy veggies consistent.  Recheck INR in 6 weeks. Coumadin Clinic [phone].

## 2023-10-25 ENCOUNTER — Other Ambulatory Visit: Payer: Self-pay

## 2023-10-25 MED ORDER — ATORVASTATIN CALCIUM 40 MG PO TABS: 40.0000 mg | ORAL_TABLET | Freq: Every day | ORAL | 3 refills | Status: DC

## 2023-10-27 ENCOUNTER — Ambulatory Visit: Payer: Medicare Other | Admitting: Pulmonary Disease

## 2023-10-27 ENCOUNTER — Encounter: Payer: Self-pay | Admitting: Pulmonary Disease

## 2023-10-27 VITALS — BP 132/75 | HR 70 | Ht 66.0 in | Wt 287.4 lb

## 2023-10-27 DIAGNOSIS — R911 Solitary pulmonary nodule: Secondary | ICD-10-CM

## 2023-10-27 DIAGNOSIS — R0602 Shortness of breath: Secondary | ICD-10-CM | POA: Diagnosis not present

## 2023-10-27 MED ORDER — BUDESONIDE-FORMOTEROL FUMARATE 160-4.5 MCG/ACT IN AERO: 2.0000 | INHALATION_SPRAY | Freq: Two times a day (BID) | RESPIRATORY_TRACT | 3 refills | Status: DC

## 2023-10-27 NOTE — Patient Instructions (Signed)
 VISIT SUMMARY:  Mrs. Scroggin, during your visit today, we discussed your recent asthma exacerbation, recent falls, sleep apnea, and your ongoing management for sick sinus syndrome and atrial flutter. We reviewed your current medications and made some recommendations for your continued care.  YOUR PLAN:  -MILD ASTHMA: Asthma is a condition where your airways narrow and swell, producing extra mucus. You reported increased coughing and occasional green sputum. We will continue your Symbicort  inhaler and repeat a CT scan in one year to monitor your condition.  -FALLS: You experienced a recent fall without significant injury. To help prevent future falls, please continue using your cane for stability.  -SLEEP APNEA: Sleep apnea is a condition where your breathing repeatedly stops and starts during sleep. Since you are having difficulty tolerating the CPAP machine, we recommend following up with your cardiologist to explore other treatment options.  -PACEMAKER FOR SICK SINUS SYNDROME: Sick sinus syndrome is a group of heart rhythm problems due to malfunction of the heart's natural pacemaker. You had a pacemaker placed in September 2024 and are currently managing well. Continue your current management with your cardiologist.  -ATRIAL FLUTTER: Atrial flutter is a type of abnormal heart rhythm. You are currently managing this condition with Coumadin . Please continue your current management.  -GENERAL HEALTH MAINTENANCE: We renewed your Symbicort  prescription and scheduled a follow-up visit in one year. Please continue with your current lifestyle and medication regimen.  INSTRUCTIONS:  Please schedule a follow-up visit in one year. Continue using your Symbicort  inhaler as prescribed. Follow up with your cardiologist to discuss alternative treatments for sleep apnea.

## 2023-10-27 NOTE — Progress Notes (Signed)
 Linda [facility]    [id number]    08/16/1951  Primary Care Physician:Wharton, Linda Washington  Referring Physician: Katina Charmaine, PA-C [address] [place],  [place] [address]  Chief complaint: Follow-up for chronic cough  HPI: 73 y.o. who  has a past medical history of Acute on chronic diastolic (congestive) heart failure (HCC) (06/2018), Anxiety, Arthritis, Basal cell carcinoma, GERD (gastroesophageal reflux disease), History of Bell's palsy, History of bronchitis, History of hiatal hernia, History of kidney stones, Hyperlipemia, Hypertension, Hypothyroidism, Incontinence, Joint swelling, Neuropathy, PONV (postoperative nausea and vomiting), RLS (restless legs syndrome), Seasonal allergies, Sleep apnea, Tremor, and Type 2 diabetes mellitus (HCC).   Here for evaluation of cough with occasional green mucus, dyspnea.  Denies any wheeze, fevers, chills.  Symptoms started after she was diagnosed and treated with pneumonia as an outpatient with doxycycline in October 2023.  Has history of seasonal allergies, OSA noncompliant with CPAP.  History notable for a fall while on Coumadin  in 2022 with left rib fractures and hemothorax which was treated conservatively.  Pets: Cats Occupation: Retired Psychologist, Clinical Exposures: No mold, hot tub, Jacuzzi.  No feather pillows or comforters Smoking history: Never smoker Travel history: Previously lived in [place]  and [place]  Relevant family history: No significant family history of lung disease  Interim history: Discussed the use of AI scribe software for clinical note transcription with the patient, who gave verbal consent to proceed.  [name], a patient with a history of mild asthma, presents with a recent exacerbation of her symptoms. She reports a persistent cough and the sensation of something being caught in her throat. Occasionally, she coughs up green sputum. Despite these symptoms, she reports no difficulty breathing. She is  currently on Symbicort  inhaler for her asthma, which she reports has been helpful in the past.  In addition to her asthma, [name] has experienced recent falls. She had a fall in 2022 and another recent fall while taking out the trash. She did not sustain any fractures from the recent fall, only bruises.  [name] also reports having a pacemaker inserted in September due to episodes of feeling outside of her body and a pulse rate of 22. She was diagnosed with sick sinus syndrome. She is currently on Coumadin  for atrial flutter.  Hospitalization records and cardiologist notes reviewed in detail.  She has been diagnosed with mild sleep apnea and was prescribed a CPAP machine, which she reports is not helpful as it wakes her up. She is considering other treatment options for her sleep apnea.    Outpatient Encounter Medications as of 10/27/2023  Medication Sig   acetaminophen  (TYLENOL ) 650 MG CR tablet Take 650 mg by mouth every 8 (eight) hours as needed for pain.   atorvastatin  (LIPITOR) 40 MG tablet Take 1 tablet (40 mg total) by mouth at bedtime.   budesonide -formoterol  (SYMBICORT ) 160-4.5 MCG/ACT inhaler Inhale 2 puffs into the lungs in the morning and at bedtime.   carvedilol  (COREG ) 3.125 MG tablet Take 2 tablets (6.25 mg total) by mouth 2 (two) times daily with a meal.   citalopram  (CELEXA ) 20 MG tablet Take 20 mg by mouth daily.   Cyanocobalamin  (VITAMIN B 12 PO) Take 1,000 mcg by mouth daily.   fluticasone (FLONASE) 50 MCG/ACT nasal spray Place 1-2 sprays into both nostrils daily as needed for allergies or rhinitis.   furosemide  (LASIX ) 40 MG tablet TAKE 1 TABLET EVERY DAY   JARDIANCE  25 MG TABS tablet 1  tablet Orally Once a day   metFORMIN  (GLUCOPHAGE ) 1000 MG tablet Take 1,000 mg by mouth 2 (two) times daily with a meal.   NOVOLOG  FLEXPEN 100 UNIT/ML FlexPen Inject 4 Units into the skin daily as needed. If Blood Sugar>200=Take 4 units   omeprazole (PRILOSEC) 40 MG capsule Take 40 mg  by mouth daily.   oxybutynin  (DITROPAN  XL) 15 MG 24 hr tablet Take 15 mg by mouth daily.   Polyethyl Glycol-Propyl Glycol (SYSTANE OP) Place 1-2 drops into both eyes daily as needed (dry eyes).   TRULICITY 0.75 MG/0.5ML SOPN Inject 0.75 mg into the skin once a week. Take on Wed   warfarin (COUMADIN ) 5 MG tablet TAKE AS DIRECTED BY COUMADIN  CLINIC   Facility-Administered Encounter Medications as of 10/27/2023  Medication   betamethasone  acetate-betamethasone  sodium phosphate (CELESTONE ) injection 3 mg   Physical Exam: Blood pressure 132/75, pulse 70, height 5' 6 (1.676 m), weight 287 lb 6.4 oz (130.4 kg), SpO2 95%. Gen:      No acute distress HEENT:  EOMI, sclera anicteric Neck:     No masses; no thyromegaly Lungs:    Clear to auscultation bilaterally; normal respiratory effort CV:         Regular rate and rhythm; no murmurs Abd:      + bowel sounds; soft, non-tender; no palpable masses, no distension Ext:    No edema; adequate peripheral perfusion Skin:      Warm and dry; no rash Neuro: alert and oriented x 3 Psych: normal mood and affect   Data Reviewed: Imaging: CT chest 09/03/2021-left rib fractures, moderate left hemithorax with consolidative changes in the left lower lobe.  Chest x-ray 12/08/2022-no active cardiopulmonary disease.  CT high-resolution 03/23/2023-nodular focus of consolidation in the left lower lobe, mild patchy air trapping.  No evidence of interstitial lung disease.  CT high resolution 09/23/2023-no clear evidence of interstitial lung disease, mild air trapping, focus of nodular consolidation/atelectatic scarring in the left lower lobe which is stable I have reviewed the images personally.  PFTs:  Labs: CBC differential 12/14/2022- WBC 9.2 eos 2.7%, absolute eosinophil count 248 IgE 12/14/2022- 66  Assessment:  Mild Asthma Increased coughing and occasional production of green sputum.  Recent CT scan reviewed which shows changes consistent with asthma and a  small stable nodule. No acute distress. -Continue Symbicort  inhaler. -Repeat CT scan in 1 year. -PFTs have been ordered but not completed yet.  Reorder PFTs  Falls Recent fall without significant injury. History of previous fall in 2022. -Encouraged to continue using cane for stability.  Sleep Apnea Difficulty tolerating CPAP. Mild sleep apnea per previous assessment. -Recommended follow-up with cardiologist for potential alternative treatments.  Pacemaker for Sick Sinus Syndrome Pacemaker placed in September 2024. No current issues reported. -Continue current management with cardiology.  Atrial Flutter Managed with Coumadin . -Continue current management.  General Health Maintenance / Followup Plans -Renew Symbicort  prescription. -Follow-up visit in 1 year.   Plan/Recommendations: Symbicort  with spacer CT, PFTs at follow-up visit.  Lonna Coder MD [facility] 10/27/2023, 9:29 AM  CC: Linda Pfeiffer, PA-C

## 2023-10-28 ENCOUNTER — Ambulatory Visit (INDEPENDENT_AMBULATORY_CARE_PROVIDER_SITE_OTHER): Payer: Medicare Other | Admitting: Podiatry

## 2023-10-28 ENCOUNTER — Encounter: Payer: Self-pay | Admitting: Podiatry

## 2023-10-28 DIAGNOSIS — M79675 Pain in left toe(s): Secondary | ICD-10-CM | POA: Diagnosis not present

## 2023-10-28 DIAGNOSIS — B351 Tinea unguium: Secondary | ICD-10-CM | POA: Diagnosis not present

## 2023-10-28 DIAGNOSIS — E119 Type 2 diabetes mellitus without complications: Secondary | ICD-10-CM | POA: Diagnosis not present

## 2023-10-28 NOTE — Progress Notes (Signed)
This patient returns to my office for at risk foot care.  This patient requires this care by a professional since this patient will be at risk due to having diabetes mellitus.and coagulation defect.   This patient is unable to cut nails herself since the patient cannot reach her nails.These nails are painful walking and wearing shoes.  This patient presents for at risk foot care today. ° °General Appearance  Alert, conversant and in no acute stress. ° °Vascular  Dorsalis pedis and posterior tibial  pulses are weakly  palpable  bilaterally.  Capillary return is within normal limits  bilaterally. Temperature is within normal limits  bilaterally. ° °Neurologic  Senn-Weinstein monofilament wire test diminished   bilaterally. Muscle power within normal limits bilaterally. ° °Nails Thick disfigured discolored nails with subungual debris  Hallux nails. No evidence of bacterial infection or drainage bilaterally. ° °Orthopedic  No limitations of motion  feet .  No crepitus or effusions noted.  HAV  B/L.  Hammer toes  B/L.  Haglunds deformity right foot.  Plantar flexed fifth metatarsal  B/L. ° °Skin  normotropic skin  noted bilaterally.  No signs of infections or ulcers noted.   Porokeratosis sub 5th met  B/L asymptomatic. ° °Onychomycosis  Pain in right toes  Pain in left toes   ° °Consent was obtained for treatment procedures.   Mechanical debridement of nails 1-5  bilaterally performed with a nail nipper.  Filed with dremel without incident. ° ° °Return office visit    3 months                  Told patient to return for periodic foot care and evaluation due to potential at risk complications. ° ° °Giamarie Bueche DPM  °

## 2023-11-03 ENCOUNTER — Ambulatory Visit (HOSPITAL_COMMUNITY): Payer: Medicare HMO | Admitting: Physician Assistant

## 2023-11-08 ENCOUNTER — Encounter (HOSPITAL_COMMUNITY): Payer: Self-pay | Admitting: Physician Assistant

## 2023-11-08 ENCOUNTER — Ambulatory Visit (HOSPITAL_COMMUNITY)
Admission: RE | Admit: 2023-11-08 | Discharge: 2023-11-08 | Disposition: A | Discharge status: Home / Self Care | Payer: Medicare Other | Source: Ambulatory Visit | Attending: Physician Assistant | Admitting: Physician Assistant

## 2023-11-08 ENCOUNTER — Telehealth: Payer: Self-pay

## 2023-11-08 VITALS — BP 132/78 | HR 60 | Ht 66.0 in | Wt 282.0 lb

## 2023-11-08 DIAGNOSIS — Z7901 Long term (current) use of anticoagulants: Secondary | ICD-10-CM | POA: Diagnosis not present

## 2023-11-08 DIAGNOSIS — I484 Atypical atrial flutter: Secondary | ICD-10-CM | POA: Insufficient documentation

## 2023-11-08 DIAGNOSIS — E669 Obesity, unspecified: Secondary | ICD-10-CM | POA: Diagnosis not present

## 2023-11-08 DIAGNOSIS — I4892 Unspecified atrial flutter: Secondary | ICD-10-CM | POA: Diagnosis not present

## 2023-11-08 DIAGNOSIS — I110 Hypertensive heart disease with heart failure: Secondary | ICD-10-CM | POA: Diagnosis not present

## 2023-11-08 DIAGNOSIS — E119 Type 2 diabetes mellitus without complications: Secondary | ICD-10-CM | POA: Insufficient documentation

## 2023-11-08 DIAGNOSIS — I10 Essential (primary) hypertension: Secondary | ICD-10-CM | POA: Diagnosis present

## 2023-11-08 DIAGNOSIS — I441 Atrioventricular block, second degree: Secondary | ICD-10-CM | POA: Insufficient documentation

## 2023-11-08 DIAGNOSIS — D6869 Other thrombophilia: Secondary | ICD-10-CM | POA: Insufficient documentation

## 2023-11-08 DIAGNOSIS — Z6841 Body Mass Index (BMI) 40.0 and over, adult: Secondary | ICD-10-CM | POA: Insufficient documentation

## 2023-11-08 DIAGNOSIS — R9431 Abnormal electrocardiogram [ECG] [EKG]: Secondary | ICD-10-CM | POA: Diagnosis not present

## 2023-11-08 DIAGNOSIS — G4733 Obstructive sleep apnea (adult) (pediatric): Secondary | ICD-10-CM | POA: Insufficient documentation

## 2023-11-08 DIAGNOSIS — E785 Hyperlipidemia, unspecified: Secondary | ICD-10-CM | POA: Insufficient documentation

## 2023-11-08 DIAGNOSIS — I5022 Chronic systolic (congestive) heart failure: Secondary | ICD-10-CM | POA: Insufficient documentation

## 2023-11-08 DIAGNOSIS — Z950 Presence of cardiac pacemaker: Secondary | ICD-10-CM | POA: Insufficient documentation

## 2023-11-08 MED ORDER — CARVEDILOL 6.25 MG PO TABS: 6.2500 mg | ORAL_TABLET | Freq: Two times a day (BID) | ORAL | 3 refills | Status: DC

## 2023-11-08 NOTE — Progress Notes (Signed)
Remote pacemaker transmission.   

## 2023-11-08 NOTE — Telephone Encounter (Signed)
Lpmtcb to schedule INR check this week for tentative DCCV in 4 weeks and she needs weekly INRs

## 2023-11-08 NOTE — Progress Notes (Addendum)
Primary Care Physician: Jarrett Soho, PA-C Primary Cardiologist: Donato Schultz, MD Electrophysiologist: Will Jorja Loa, MD  Referring Physician: ED     Linda Washington is a 73 y.o. female with a history of HTN, T2DM, OSA, chronic diastolic heart failure, HLD, obesity, and rate-controlled atrial flutter who presents for follow up in the [facility]. Seen in ED on 05/16/23 for weakness, bradycardia, and SOB; patient noting at home HR 30-40s normally in the 60s. Heart rates noted in the ED as low as 19-23; she did not require emergent procedure and was discharged home. Carvedilol held per cardiology. Patient is on warfarin for a CHADS2VASC score of 4. 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 The device clinic received an alert for atrial flutter, rapid V rate at times. Her carvedilol was resumed. She reported that she has had atrial flutter for several years and is unaware of her arrhythmia.   Patient returns for follow up for atrial flutter. She remains in persistent rate controlled atrial flutter. She does admit to fatigue with exertion but otherwise is unaware of her arrhythmia. She does bruise easily on warfarin.    Today, he denies symptoms of palpitations, chest pain, orthopnea, PND, lower extremity edema, dizziness, presyncope, syncope, snoring, daytime somnolence, bleeding, or neurologic sequela. The patient is tolerating medications without difficulties and is otherwise without complaint today.    Current Outpatient Medications  Medication Sig Dispense Refill   acetaminophen (TYLENOL) 650 MG CR tablet Take 650 mg by mouth every 8 (eight) hours as needed for pain.     atorvastatin (LIPITOR) 40 MG tablet Take 1 tablet (40 mg total) by mouth at bedtime. 90 tablet 3   budesonide-formoterol (SYMBICORT) 160-4.5 MCG/ACT inhaler Inhale 2 puffs into the  lungs in the morning and at bedtime. 3 each 3   carvedilol (COREG) 3.125 MG tablet Take 2 tablets (6.25 mg total) by mouth 2 (two) times daily with a meal.     citalopram (CELEXA) 20 MG tablet Take 20 mg by mouth daily.     Cyanocobalamin (VITAMIN B 12 PO) Take 1,000 mcg by mouth daily.     fluticasone (FLONASE) 50 MCG/ACT nasal spray Place 1-2 sprays into both nostrils daily as needed for allergies or rhinitis.     furosemide (LASIX) 40 MG tablet TAKE 1 TABLET EVERY DAY 90 tablet 2   JARDIANCE 25 MG TABS tablet 1 tablet Orally Once a day     metFORMIN (GLUCOPHAGE) 1000 MG tablet Take 1,000 mg by mouth 2 (two) times daily with a meal.     NOVOLOG FLEXPEN 100 UNIT/ML FlexPen Inject 4 Units into the skin daily as needed. If Blood Sugar>200=Take 4 units     omeprazole (PRILOSEC) 40 MG capsule Take 40 mg by mouth daily.     oxybutynin (DITROPAN XL) 15 MG 24 hr tablet Take 15 mg by mouth daily.     Polyethyl Glycol-Propyl Glycol (SYSTANE OP) Place 1-2 drops into both eyes daily as needed (dry eyes).     TRULICITY 0.75 MG/0.5ML SOPN Inject 0.75 mg into the skin once a week. Take on Wed     warfarin (COUMADIN) 5 MG tablet TAKE AS DIRECTED BY COUMADIN CLINIC 170 tablet 1   Current Facility-Administered Medications  Medication Dose Route Frequency Provider Last Rate Last Admin   betamethasone acetate-betamethasone sodium phosphate (CELESTONE) injection 3 mg  3  mg Intra-articular Once Felecia Shelling, [place]        Atrial Fibrillation Management history:  Previous antiarrhythmic drugs: None Previous cardioversions: 06/22/2018 Previous ablations: None Anticoagulation history: warfarin   ROS- All systems are reviewed and negative except as per the HPI above.  Physical Exam: BP 132/78   Pulse 60   Ht 5\' 6"  (1.676 m)   Wt 127.9 kg   BMI 45.52 kg/m   GEN: Well nourished, well developed in no acute distress CARDIAC: Regular rate and rhythm, no murmurs, rubs, gallops RESPIRATORY:  Clear to  auscultation without rales, wheezing or rhonchi  ABDOMEN: Soft, non-tender, non-distended EXTREMITIES:  No edema; No deformity    EKG today demonstrates  Atypical atrial flutter with 3:1 block Vent. rate 60 BPM PR interval 114 ms QRS duration 102 ms QT/QTcB 458/458 ms   Echo 01/01/23 demonstrated   1. Left ventricular ejection fraction, by estimation, is 55 to 60%. The  left ventricle has normal function. The left ventricle has no regional  wall motion abnormalities. Left ventricular diastolic parameters were  normal.   2. Right ventricular systolic function is normal. The right ventricular  size is normal. There is mildly elevated pulmonary artery systolic  pressure.   3. Mild to moderate mitral valve regurgitation.   4. Tricuspid valve regurgitation is mild to moderate.   5. Aortic valve regurgitation is trivial.   6. The inferior vena cava is dilated in size with >50% respiratory  variability, suggesting right atrial pressure of 8 mmHg.    CHA2DS2-VASc Score = 5  The patient's score is based upon: CHF History: 0 HTN History: 1 Diabetes History: 1 Stroke History: 0 Vascular Disease History: 1 (aoritc atherosclerosis) Age Score: 1 Gender Score: 1        ASSESSMENT AND PLAN: Atypical Atrial Flutter The patient's CHA2DS2-VASc score is 5, indicating a 7.2% annual risk of stroke.   We discussed rate vs rhythm control today. Given her fatigue with exertion, will proceed with a trial of SR. After discussing the risks and benefits, will arrange for DCCV. She will need 4 therapeutic INR levels.  Continue carvedilol 6.25 mg BID Continue warfarin Can consider flecainide or dofetilide if she has quick return of atrial flutter. BMI likely prohibitive for ablation.   Secondary Hypercoagulable State (ICD10:  D68.69) The patient is at significant risk for stroke/thromboembolism based upon her CHA2DS2-VASc Score of 5.  Continue Warfarin (Coumadin). No bleeding issues. Patient  concerned about her bruising while taking warfarin. She has also had some labile INR levels (1.6 on 06/25/23, 3.4 on 09/13/23). She inquires about Watchman today, brochure given. Will refer her to the [facility] team for evaluation.   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  HTN Stable on current regimen  OSA  Mild, difficulty tolerating CPAP Not a candidate for Inspire Followed by Dr Tresa Endo  Obesity Body mass index is 45.52 kg/m.  Encouraged lifestyle modification   Follow up with the Watchman team for evaluation. Follow up in the AF clinic pending timing of DCCV.    Informed Consent   Shared Decision Making/Informed Consent The risks (stroke, cardiac arrhythmias rarely resulting in the need for a temporary or permanent pacemaker, skin irritation or burns and complications associated with conscious sedation including aspiration, arrhythmia, respiratory failure and death), benefits (restoration of normal sinus rhythm) and alternatives of a direct current cardioversion were explained in detail to Ms. Arambula and she agrees to proceed.  Jorja Loa PA-C Afib Clinic [facility] [address] [place], [place] [address] [phone]

## 2023-11-08 NOTE — Patient Instructions (Signed)
Will need 4 weekly INR checks before cardioversion. Once you have 2 weekly checks call Dai Apel to get the cardioversion date scheduled.

## 2023-11-08 NOTE — Addendum Note (Signed)
Addended by: Elease Etienne A on: 11/08/2023 11:46 AM   Modules accepted: Orders

## 2023-11-09 ENCOUNTER — Telehealth: Payer: Self-pay

## 2023-11-09 NOTE — Telephone Encounter (Signed)
Scheduled the patient for Watchman consult with Dr. Lalla Brothers 12/20/2023.

## 2023-11-09 NOTE — Telephone Encounter (Addendum)
Per Jorja Loa, called to arrange [facility] consult.  Left message to call back.

## 2023-11-11 ENCOUNTER — Ambulatory Visit: Payer: Medicare Other | Attending: Cardiology | Admitting: *Deleted

## 2023-11-11 DIAGNOSIS — Z5181 Encounter for therapeutic drug level monitoring: Secondary | ICD-10-CM

## 2023-11-11 DIAGNOSIS — I4892 Unspecified atrial flutter: Secondary | ICD-10-CM

## 2023-11-11 LAB — POCT INR: INR: 2.6 (ref 2.0–3.0)

## 2023-11-11 NOTE — Patient Instructions (Signed)
Description   Continue taking Warfarin 1.5 tablets daily except 1 tablet on Thursdays. Keep leafy veggies consistent- do not eat more since pending Cardioversion.  Recheck INR in 1 week . Coumadin Clinic [phone].

## 2023-11-15 ENCOUNTER — Telehealth: Payer: Self-pay | Admitting: Pulmonary Disease

## 2023-11-15 NOTE — Telephone Encounter (Signed)
I called and spoke with the pt  She states that her blood sugars have been elevated since starting symbicort  She has been on this for a while, but started taking more consistently after recent visit and her BS has been above 200 fasting  She is asking if there is something else that will not affect her diabetes  Please advise, thanks!

## 2023-11-15 NOTE — Telephone Encounter (Signed)
PT was RX's Symbicort but she is diabetic and it is making her sugar go into the 200's. Can Dr. Malena Peer something w/o Cortisol?  Her # is [phone]

## 2023-11-17 DIAGNOSIS — H43813 Vitreous degeneration, bilateral: Secondary | ICD-10-CM | POA: Diagnosis not present

## 2023-11-17 DIAGNOSIS — H02403 Unspecified ptosis of bilateral eyelids: Secondary | ICD-10-CM | POA: Diagnosis not present

## 2023-11-17 DIAGNOSIS — H04123 Dry eye syndrome of bilateral lacrimal glands: Secondary | ICD-10-CM | POA: Diagnosis not present

## 2023-11-17 DIAGNOSIS — E119 Type 2 diabetes mellitus without complications: Secondary | ICD-10-CM | POA: Diagnosis not present

## 2023-11-18 ENCOUNTER — Ambulatory Visit: Payer: Medicare Other | Attending: Cardiology | Admitting: Pharmacist

## 2023-11-18 DIAGNOSIS — I4892 Unspecified atrial flutter: Secondary | ICD-10-CM | POA: Diagnosis not present

## 2023-11-18 DIAGNOSIS — Z5181 Encounter for therapeutic drug level monitoring: Secondary | ICD-10-CM | POA: Diagnosis not present

## 2023-11-18 DIAGNOSIS — M65332 Trigger finger, left middle finger: Secondary | ICD-10-CM | POA: Diagnosis not present

## 2023-11-18 DIAGNOSIS — M65321 Trigger finger, right index finger: Secondary | ICD-10-CM | POA: Diagnosis not present

## 2023-11-18 DIAGNOSIS — M65331 Trigger finger, right middle finger: Secondary | ICD-10-CM | POA: Diagnosis not present

## 2023-11-18 LAB — POCT INR: INR: 3.2 — AB (ref 2.0–3.0)

## 2023-11-18 NOTE — Patient Instructions (Signed)
Description   Continue taking Warfarin 1.5 tablets daily except 1 tablet on Thursdays. Keep leafy veggies consistent- do not eat more since pending Cardioversion.  Recheck INR in 1 week . Coumadin Clinic [phone].

## 2023-11-19 ENCOUNTER — Telehealth: Payer: Self-pay | Admitting: *Deleted

## 2023-11-19 MED ORDER — CARVEDILOL 6.25 MG PO TABS: ORAL_TABLET | ORAL | 3 refills | Status: DC

## 2023-11-19 NOTE — Telephone Encounter (Signed)
Alert received from [employer]   Device alert for HVR x3 on 1/30, longest duration , HR's 177-183, atrial driven Known persistent AF/AFL, burden70%%, Warfarin per EPIC Per ov 1/20 will plan DCCV in 4 weeks, watchman consult 3/3 Route for HVR duration.  Discussed with Jorja Loa with afib clinic. Order received to increase carvedilol 12.5 mg AM and 6.25 mg PM. Will call patient.   Contacted patient and explained the change in her medication and the discussion between this RN and Jorja Loa, PA.   Patient verbally acknowledged understanding of medication dose change and the reasons why. Primary pharmacy verified with patient and order placed.

## 2023-11-22 NOTE — Telephone Encounter (Signed)
Patient checking on message for Symbicort inhaler. Patient phone number is [phone].

## 2023-11-22 NOTE — Telephone Encounter (Signed)
Spoke to patient.  She is calling for update on Symbicort causing high blood sugar. She completed Rx of Symbicort 2 days ago.  Dr. Isaiah Serge, please advise. Thanks

## 2023-11-23 NOTE — Telephone Encounter (Signed)
I called and discussed with the patient.  Since the symbicort is causing elevated blood sugars, is not affordable for the patient and has not helped with the symptoms of cough we have decided to discontinue the medication. Nothing further needed.

## 2023-11-25 ENCOUNTER — Ambulatory Visit: Payer: Medicare Other | Attending: Cardiology

## 2023-11-25 DIAGNOSIS — I4892 Unspecified atrial flutter: Secondary | ICD-10-CM

## 2023-11-25 LAB — POCT INR: INR: 2.5 (ref 2.0–3.0)

## 2023-11-25 NOTE — Patient Instructions (Signed)
 Description   Continue taking Warfarin 1.5 tablets daily except 1 tablet on Thursdays. Keep leafy veggies consistent- do not eat more since pending Cardioversion.  Recheck INR in 1 week . Coumadin Clinic [phone].

## 2023-12-01 ENCOUNTER — Other Ambulatory Visit (HOSPITAL_COMMUNITY): Payer: Self-pay | Admitting: *Deleted

## 2023-12-01 DIAGNOSIS — I4892 Unspecified atrial flutter: Secondary | ICD-10-CM

## 2023-12-02 ENCOUNTER — Ambulatory Visit: Payer: Medicare Other | Attending: Cardiology

## 2023-12-02 DIAGNOSIS — I4892 Unspecified atrial flutter: Secondary | ICD-10-CM | POA: Diagnosis not present

## 2023-12-02 LAB — POCT INR: INR: 2.2 (ref 2.0–3.0)

## 2023-12-02 NOTE — Patient Instructions (Signed)
Description   Continue taking Warfarin 1.5 tablets daily except 1 tablet on Thursdays. Keep leafy veggies consistent- do not eat more since pending Cardioversion.  Recheck INR in 1 week . Coumadin Clinic [phone].

## 2023-12-06 ENCOUNTER — Ambulatory Visit (HOSPITAL_COMMUNITY)
Admission: RE | Admit: 2023-12-06 | Discharge: 2023-12-06 | Disposition: A | Discharge status: Home / Self Care | Payer: Medicare Other | Source: Ambulatory Visit | Attending: Physician Assistant | Admitting: Physician Assistant

## 2023-12-06 DIAGNOSIS — I484 Atypical atrial flutter: Secondary | ICD-10-CM | POA: Insufficient documentation

## 2023-12-06 LAB — BASIC METABOLIC PANEL
Anion gap: 11 (ref 5–15)
BUN: 14 mg/dL (ref 8–23)
CO2: 25 mmol/L (ref 22–32)
Calcium: 9 mg/dL (ref 8.9–10.3)
Chloride: 105 mmol/L (ref 98–111)
Creatinine, Ser: 1.03 mg/dL — ABNORMAL HIGH (ref 0.44–1.00)
GFR, Estimated: 57 mL/min — ABNORMAL LOW (ref >60)
Glucose, Bld: 189 mg/dL — ABNORMAL HIGH (ref 70–99)
Potassium: 3.9 mmol/L (ref 3.5–5.1)
Sodium: 141 mmol/L (ref 135–145)

## 2023-12-06 LAB — CBC
HCT: 40.7 % (ref 36.0–46.0)
Hemoglobin: 12.9 g/dL (ref 12.0–15.0)
MCH: 29.1 pg (ref 26.0–34.0)
MCHC: 31.7 g/dL (ref 30.0–36.0)
MCV: 91.9 fL (ref 80.0–100.0)
Platelets: 321 10*3/uL (ref 150–400)
RBC: 4.43 MIL/uL (ref 3.87–5.11)
RDW: 16.1 % — ABNORMAL HIGH (ref 11.5–15.5)
WBC: 8.6 10*3/uL (ref 4.0–10.5)
nRBC: 0 % (ref 0.0–0.2)

## 2023-12-07 ENCOUNTER — Ambulatory Visit: Payer: Medicare Other | Attending: Cardiovascular Disease

## 2023-12-07 ENCOUNTER — Telehealth (HOSPITAL_COMMUNITY): Payer: Self-pay | Admitting: *Deleted

## 2023-12-07 ENCOUNTER — Telehealth: Payer: Self-pay

## 2023-12-07 DIAGNOSIS — I4892 Unspecified atrial flutter: Secondary | ICD-10-CM

## 2023-12-07 DIAGNOSIS — Z5181 Encounter for therapeutic drug level monitoring: Secondary | ICD-10-CM | POA: Diagnosis not present

## 2023-12-07 LAB — POCT INR: INR: 1.7 — AB (ref 2.0–3.0)

## 2023-12-07 NOTE — Telephone Encounter (Signed)
-----   Message from Nurse Casimiro Needle D sent at 12/07/2023  4:02 PM EST ----- Regarding: INR Patient came in today for INR check pre Cardioversion 2/20 and resulted at 1.7.  She said that she ate more greens in the last 5 days, not sure why?  I boosted her up today, but not sure what you want to do.  Thank you  Casimiro Needle

## 2023-12-07 NOTE — Patient Instructions (Signed)
 Take 2.5 tablets today then Continue taking Warfarin 1.5 tablets daily except 1 tablet on Thursdays.  Keep leafy veggies consistent- do not eat more since pending Cardioversion.  Recheck INR in 2 weeks. Coumadin Clinic [phone].

## 2023-12-07 NOTE — Telephone Encounter (Signed)
 Lpmtcb to schedule weekly INR checks starting next week for rescheduled Cardioversion.

## 2023-12-07 NOTE — Telephone Encounter (Signed)
 Pt aware. Will restart weekly INRs. DCCV to be rescheduled once INRs therapeutic.

## 2023-12-07 NOTE — Telephone Encounter (Signed)
 Left message per Jorja Loa PA will cancel cardioversion that is currently scheduled. Will need to restart weekly INRs once therapeutic.

## 2023-12-08 ENCOUNTER — Ambulatory Visit: Payer: Medicare Other

## 2023-12-09 ENCOUNTER — Encounter (HOSPITAL_COMMUNITY): Admission: RE | Payer: Self-pay | Source: Ambulatory Visit

## 2023-12-09 ENCOUNTER — Ambulatory Visit (HOSPITAL_COMMUNITY): Admission: RE | Admit: 2023-12-09 | Payer: Medicare Other | Source: Ambulatory Visit | Admitting: Cardiology

## 2023-12-09 SURGERY — CARDIOVERSION (CATH LAB)
Anesthesia: General

## 2023-12-19 NOTE — Progress Notes (Unsigned)
 Electrophysiology Office Note:    Date:  12/20/2023   ID:  Linda Washington, DOB 1951-08-06, MRN [id number]  [facility] Cardiologist:  Donato Schultz, MD  [facility] Electrophysiologist:  Will Jorja Loa, MD   Referring MD: Jarrett Soho, PA-C   Chief Complaint: Atrial fibrillation  History of Present Illness:    Linda Washington is a 73 year old woman who I am seeing today for an evaluation of atrial fibrillation at the request of Jorja Loa.  The patient has a history of hypertension, diabetes, sleep apnea, chronic diastolic heart failure, hyperlipidemia, obesity and atrial fibrillation/flutter.  She takes warfarin for stroke prophylaxis.  She had a pacemaker implanted June 25, 2023 for severe bradycardia.  She has experienced difficult to control INRs and presents today for discussion re: Watchman   She was recently scheduled for a cardioversion but this was canceled given a subtherapeutic INR.  She tells me she is never taken a NOAC.     Their past medical, social and family history was reviewed.   ROS:   Please see the history of present illness.    All other systems reviewed and are negative.  EKGs/Labs/Other Studies Reviewed:    The following studies were reviewed today:  01/01/2023 Echo EF 55-60 RV normal Mild to moderate MR Mod TR         Physical Exam:    VS:  BP 114/68   Pulse (!) 59   Ht 5\' 6"  (1.676 m)   Wt 284 lb 9.6 oz (129.1 kg)   SpO2 95%   BMI 45.94 kg/m     Wt Readings from Last 3 Encounters:  12/20/23 284 lb 9.6 oz (129.1 kg)  11/08/23 282 lb (127.9 kg)  10/27/23 287 lb 6.4 oz (130.4 kg)     GEN: no distress.  Obese CARD: irregularly irregular, No MRG.  Pacemaker pocket well-healed. RESP: No IWOB. CTAB.        ASSESSMENT AND PLAN:    1. Atrial flutter, unspecified type (HCC)   2. Atypical atrial flutter (HCC)   3. Sick sinus syndrome (HCC)   4. Pacemaker      #Persistent Atrial Flutter, atypical She has  longstanding persistent atrial arrhythmias.  In reviewing her EKGs, she has atrial flutters.  There is at least 1 EKG that may be coarse atrial fibrillation.  She has been on Coumadin but has difficult to control INR's.  She therefore presents to discuss left atrial appendage occlusion.  I discussed the Watchman procedure in detail during today's visit.  I would like to start by rescheduling her cardioversion.  Will try to get her switched over to Eliquis depending on the finances of such a change.  I will have her check with her pharmacist today with this question.  I will plan to see her back in about 2 months to see if she is maintained normal rhythm.  If she has returned to atrial fibrillation/flutter, will consider her rhythm to be permanent and will focus on rate control.    --------------  I have seen Linda Washington in the office today who is being considered for a Watchman left atrial appendage closure device. I believe they will benefit from this procedure given their history of atrial fibrillation, CHA2DS2-VASc score of 5 and unadjusted ischemic stroke rate of 7.2% per year. Unfortunately, the patient is not felt to be a long term anticoagulation candidate secondary to difficult to control INR's. The patient's chart has been reviewed and I feel that they would be  a candidate for short term oral anticoagulation after Watchman implant.   It is my belief that after undergoing a LAA closure procedure, Linda Washington will not need long term anticoagulation which eliminates anticoagulation side effects and major bleeding risk.   Procedural risks for the Watchman implant have been reviewed with the patient including a 0.5% risk of stroke, <1% risk of perforation and <1% risk of device embolization. Other risks include bleeding, vascular damage, tamponade, worsening renal function, and death.   The published clinical data on the safety and effectiveness of WATCHMAN include but are not limited to  the following: - Holmes DR, Everlene Farrier, Sick P et al. for the PROTECT AF Investigators. Percutaneous closure of the left atrial appendage versus warfarin therapy for prevention of stroke in patients with atrial fibrillation: a randomised non-inferiority trial. Lancet 2009; 374: 534-42. Everlene Farrier, Doshi SK, Isa Rankin D et al. on behalf of the PROTECT AF Investigators. Percutaneous Left Atrial Appendage Closure for Stroke Prophylaxis in Patients With Atrial Fibrillation 2.3-Year Follow-up of the PROTECT AF (Watchman Left Atrial Appendage System for Embolic Protection in Patients With Atrial Fibrillation) Trial. Circulation 2013; 127:720-729. - Alli O, Doshi S,  Kar S, Reddy VY, Sievert H et al. Quality of Life Assessment in the Randomized PROTECT AF (Percutaneous Closure of the Left Atrial Appendage Versus Warfarin Therapy for Prevention of Stroke in Patients With Atrial Fibrillation) Trial of Patients at Risk for Stroke With Nonvalvular Atrial Fibrillation. J Am Coll Cardiol 2013; 61:1790-8. Aline August DR, Mia [place], Price M, Whisenant B, Sievert H, Doshi S, Huber K, Reddy V. Prospective randomized evaluation of the Watchman left atrial appendage Device in patients with atrial fibrillation versus long-term warfarin therapy; the PREVAIL trial. Journal of the [employer] of Cardiology, Vol. 4, No. 1, 2014, 1-11. - Kar S, Doshi SK, Sadhu A, Horton R, Osorio J et al. Primary outcome evaluation of a next-generation left atrial appendage closure device: results from the PINNACLE FLX trial. Circulation 2021;143(18)1754-1762.   HAS-BLED score 3 Hypertension Yes  Abnormal renal and liver function (Dialysis, transplant, Cr >2.26 mg/dL /Cirrhosis or Bilirubin >2x Normal or AST/ALT/AP >3x Normal) No  Stroke No  Bleeding No  Labile INR (Unstable/high INR) Yes  Elderly (>65) Yes  Drugs or alcohol (>= 8 drinks/week, anti-plt or NSAID) No   CHA2DS2-VASc Score = 5  The patient's score is based upon: CHF  History: 0 HTN History: 1 Diabetes History: 1 Stroke History: 0 Vascular Disease History: 1 (aoritc atherosclerosis) Age Score: 1 Gender Score: 1  Follow-up 2 months with me.  Review rhythm control and watchman at that appointment.    Signed, Rossie Muskrat. Lalla Brothers, MD, [facility], [facility] 12/20/2023 12:28 PM    Electrophysiology [facility]

## 2023-12-20 ENCOUNTER — Encounter: Payer: Self-pay | Admitting: Cardiology

## 2023-12-20 ENCOUNTER — Encounter: Payer: Self-pay | Admitting: Pharmacist

## 2023-12-20 ENCOUNTER — Ambulatory Visit: Payer: Medicare Other | Attending: Cardiology | Admitting: Cardiology

## 2023-12-20 ENCOUNTER — Ambulatory Visit (INDEPENDENT_AMBULATORY_CARE_PROVIDER_SITE_OTHER): Payer: Self-pay | Admitting: Pharmacist

## 2023-12-20 VITALS — BP 114/68 | HR 59 | Ht 66.0 in | Wt 284.6 lb

## 2023-12-20 DIAGNOSIS — I484 Atypical atrial flutter: Secondary | ICD-10-CM

## 2023-12-20 DIAGNOSIS — Z950 Presence of cardiac pacemaker: Secondary | ICD-10-CM | POA: Diagnosis not present

## 2023-12-20 DIAGNOSIS — I4892 Unspecified atrial flutter: Secondary | ICD-10-CM

## 2023-12-20 DIAGNOSIS — I495 Sick sinus syndrome: Secondary | ICD-10-CM

## 2023-12-20 DIAGNOSIS — Z5181 Encounter for therapeutic drug level monitoring: Secondary | ICD-10-CM

## 2023-12-20 LAB — POCT INR: INR: 2 (ref 2.0–3.0)

## 2023-12-20 MED ORDER — APIXABAN 5 MG PO TABS: 5.0000 mg | ORAL_TABLET | Freq: Two times a day (BID) | ORAL | 0 refills | Status: DC

## 2023-12-20 NOTE — Addendum Note (Signed)
 Addended by: Tylene Fantasia on: 12/20/2023 02:15 PM   Modules accepted: Orders

## 2023-12-20 NOTE — Patient Instructions (Addendum)
 Today your INR was 2. Skip Warfarin dose tonight and start taking Eliquis 5 mg twice daily starting from tomorrow morning ( Mar 4,2025).

## 2023-12-20 NOTE — Telephone Encounter (Signed)
Error

## 2023-12-20 NOTE — Patient Instructions (Addendum)
 Medication Instructions:  Your physician recommends that you continue on your current medications as directed. Please refer to the Current Medication list given to you today.   *If you need a refill on your cardiac medications before your next appointment, please call your pharmacy*  Tests/Procedures Cardioversion Your physician has recommended that you have a Cardioversion (DCCV). Electrical Cardioversion uses a jolt of electricity to your heart either through paddles or wired patches attached to your chest. This is a controlled, usually prescheduled, procedure. Defibrillation is done under light anesthesia in the hospital, and you usually go home the day of the procedure. This is done to get your heart back into a normal rhythm. You are not awake for the procedure. Please see the instruction sheet given to you today.  Follow-Up: At [facility], you and your health needs are our priority.  As part of our continuing mission to provide you with exceptional heart care, we have created designated Provider Care Teams.  These Care Teams include your primary Cardiologist (physician) and Advanced Practice Providers (APPs -  Physician Assistants and Nurse Practitioners) who all work together to provide you with the care you need, when you need it.  Your next appointment:   2 months  Provider:   Steffanie Dunn, MD

## 2023-12-20 NOTE — Progress Notes (Addendum)
 Pt was in to see Dr. Lalla Brothers. INR check was requested to switch warfarin to Eliquis. 2 weeks worth of samples provided. PAP paperwork for Eliquis signed by patient will assess eligibility.

## 2023-12-20 NOTE — Addendum Note (Signed)
 Addended by: Frutoso Schatz on: 12/20/2023 12:57 PM   Modules accepted: Orders

## 2023-12-21 ENCOUNTER — Telehealth: Payer: Self-pay

## 2023-12-21 ENCOUNTER — Ambulatory Visit: Payer: Medicare Other

## 2023-12-21 ENCOUNTER — Other Ambulatory Visit (HOSPITAL_COMMUNITY): Payer: Self-pay

## 2023-12-21 DIAGNOSIS — I4892 Unspecified atrial flutter: Secondary | ICD-10-CM

## 2023-12-21 NOTE — Telephone Encounter (Addendum)
 Received PAP application. Missing 3% OOP spending, POI, and provider signature. Reaching out to patient via mychart to obtain needed documents.

## 2023-12-24 ENCOUNTER — Other Ambulatory Visit (HOSPITAL_COMMUNITY): Payer: Self-pay

## 2023-12-24 NOTE — Telephone Encounter (Addendum)
 Received income paperwork and spending from patient. Just need a provider to sign the prescription form. V.Patel, RPH is aware and working on this.

## 2023-12-24 NOTE — Telephone Encounter (Signed)
 PAP: Application for Eliquis has been submitted to [employer] ([employer]), via fax If patient requests an update in the meantime, please refer them to [employer] at [phone]

## 2023-12-28 ENCOUNTER — Ambulatory Visit (INDEPENDENT_AMBULATORY_CARE_PROVIDER_SITE_OTHER): Payer: Medicare HMO

## 2023-12-28 DIAGNOSIS — I4892 Unspecified atrial flutter: Secondary | ICD-10-CM | POA: Diagnosis not present

## 2023-12-28 DIAGNOSIS — Z794 Long term (current) use of insulin: Secondary | ICD-10-CM | POA: Diagnosis not present

## 2023-12-28 DIAGNOSIS — E1142 Type 2 diabetes mellitus with diabetic polyneuropathy: Secondary | ICD-10-CM | POA: Diagnosis not present

## 2023-12-28 NOTE — Telephone Encounter (Signed)
 Fax received from [employer] that application will not be accepted due to being done on outdated forms. Will have to redo on updated application format.

## 2023-12-29 LAB — CUP PACEART REMOTE DEVICE CHECK
Battery Remaining Longevity: 92 mo
Battery Remaining Percentage: 95.5 %
Battery Voltage: 3.01 V
Brady Statistic AP VP Percent: 15 %
Brady Statistic AP VS Percent: 49 %
Brady Statistic AS VP Percent: 22 %
Brady Statistic AS VS Percent: 13 %
Brady Statistic RA Percent Paced: 19 %
Brady Statistic RV Percent Paced: 60 %
Date Time Interrogation Session: 20250311020015
Implantable Lead Connection Status: 753985
Implantable Lead Connection Status: 753985
Implantable Lead Implant Date: 20240906
Implantable Lead Implant Date: 20240906
Implantable Lead Location: 753859
Implantable Lead Location: 753860
Implantable Pulse Generator Implant Date: 20240906
Lead Channel Impedance Value: 390 Ohm
Lead Channel Impedance Value: 460 Ohm
Lead Channel Pacing Threshold Amplitude: 0.5 V
Lead Channel Pacing Threshold Amplitude: 0.75 V
Lead Channel Pacing Threshold Pulse Width: 0.5 ms
Lead Channel Pacing Threshold Pulse Width: 0.5 ms
Lead Channel Sensing Intrinsic Amplitude: 1.2 mV
Lead Channel Sensing Intrinsic Amplitude: 12 mV
Lead Channel Setting Pacing Amplitude: 1 V
Lead Channel Setting Pacing Amplitude: 3.5 V
Lead Channel Setting Pacing Pulse Width: 0.5 ms
Lead Channel Setting Sensing Sensitivity: 2 mV
Pulse Gen Model: 2272
Pulse Gen Serial Number: 8206505

## 2023-12-30 DIAGNOSIS — L304 Erythema intertrigo: Secondary | ICD-10-CM | POA: Diagnosis not present

## 2023-12-30 DIAGNOSIS — L570 Actinic keratosis: Secondary | ICD-10-CM | POA: Diagnosis not present

## 2023-12-30 DIAGNOSIS — Z85828 Personal history of other malignant neoplasm of skin: Secondary | ICD-10-CM | POA: Diagnosis not present

## 2023-12-30 DIAGNOSIS — L821 Other seborrheic keratosis: Secondary | ICD-10-CM | POA: Diagnosis not present

## 2023-12-30 DIAGNOSIS — D485 Neoplasm of uncertain behavior of skin: Secondary | ICD-10-CM | POA: Diagnosis not present

## 2023-12-30 DIAGNOSIS — B079 Viral wart, unspecified: Secondary | ICD-10-CM | POA: Diagnosis not present

## 2023-12-30 DIAGNOSIS — L820 Inflamed seborrheic keratosis: Secondary | ICD-10-CM | POA: Diagnosis not present

## 2023-12-30 DIAGNOSIS — D1801 Hemangioma of skin and subcutaneous tissue: Secondary | ICD-10-CM | POA: Diagnosis not present

## 2023-12-30 DIAGNOSIS — L814 Other melanin hyperpigmentation: Secondary | ICD-10-CM | POA: Diagnosis not present

## 2023-12-30 DIAGNOSIS — D692 Other nonthrombocytopenic purpura: Secondary | ICD-10-CM | POA: Diagnosis not present

## 2024-01-03 ENCOUNTER — Telehealth: Payer: Self-pay

## 2024-01-03 MED ORDER — APIXABAN 5 MG PO TABS: 5.0000 mg | ORAL_TABLET | Freq: Two times a day (BID) | ORAL | Status: DC

## 2024-01-03 NOTE — Telephone Encounter (Signed)
 Patient requesting samples, please advise

## 2024-01-03 NOTE — Telephone Encounter (Signed)
Ok to give 2 weeks of Eliquis '5mg'$  BID

## 2024-01-03 NOTE — Telephone Encounter (Signed)
 The patient called to request Eliquis samples in preparation for her DCCV 4/1. She understood a message will be sent to Dr. Lovena Neighbours nurse for assistance.

## 2024-01-03 NOTE — Telephone Encounter (Signed)
 See previous phone note. Samples have been left for the patient at the front office.

## 2024-01-03 NOTE — Telephone Encounter (Signed)
 Called pt to inform her that we were leaving her 2 weeks of Eliquis 5 mg tablets samples at [facility] front desk for pt to pick up, along with another application, but pt stated that she has the application and will be bringing it when she comes. I advised the pt that if she has any other problems, questions or concerns, to give our office a call back. Pt verbalized understanding. FYI

## 2024-01-06 DIAGNOSIS — Z950 Presence of cardiac pacemaker: Secondary | ICD-10-CM | POA: Diagnosis not present

## 2024-01-06 DIAGNOSIS — I495 Sick sinus syndrome: Secondary | ICD-10-CM | POA: Diagnosis not present

## 2024-01-06 DIAGNOSIS — I484 Atypical atrial flutter: Secondary | ICD-10-CM | POA: Diagnosis not present

## 2024-01-06 DIAGNOSIS — I4892 Unspecified atrial flutter: Secondary | ICD-10-CM | POA: Diagnosis not present

## 2024-01-07 LAB — BASIC METABOLIC PANEL
BUN/Creatinine Ratio: 14 (ref 12–28)
BUN: 13 mg/dL (ref 8–27)
CO2: 20 mmol/L (ref 20–29)
Calcium: 9.3 mg/dL (ref 8.7–10.3)
Chloride: 101 mmol/L (ref 96–106)
Creatinine, Ser: 0.91 mg/dL (ref 0.57–1.00)
Glucose: 152 mg/dL — ABNORMAL HIGH (ref 70–99)
Potassium: 3.5 mmol/L (ref 3.5–5.2)
Sodium: 141 mmol/L (ref 134–144)
eGFR: 66 mL/min/{1.73_m2} (ref >59)

## 2024-01-07 LAB — CBC
Hematocrit: 40.2 % (ref 34.0–46.6)
Hemoglobin: 13.2 g/dL (ref 11.1–15.9)
MCH: 29.3 pg (ref 26.6–33.0)
MCHC: 32.8 g/dL (ref 31.5–35.7)
MCV: 89 fL (ref 79–97)
Platelets: 347 10*3/uL (ref 150–450)
RBC: 4.5 x10E6/uL (ref 3.77–5.28)
RDW: 14.5 % (ref 11.7–15.4)
WBC: 9.1 10*3/uL (ref 3.4–10.8)

## 2024-01-09 NOTE — Pre-Procedure Instructions (Signed)
 Attempted to call patient regarding procedure scheduled on 01/18/24 with anesthesia.  Anesthesia requires Trulicity to be held 7 days before procedure.  Last dose on Truilicity can be 01/10/23.  Do not take any after Monday, until after procedure.

## 2024-01-12 NOTE — Telephone Encounter (Addendum)
 Patient's application is still pending with [employer]. Will need samples again on 01/17/24 if not resolved.

## 2024-01-13 ENCOUNTER — Other Ambulatory Visit (HOSPITAL_COMMUNITY): Payer: Self-pay

## 2024-01-13 NOTE — Telephone Encounter (Signed)
 PAP: Patient has been denied for patient assistance by [employer] ([employer]) due to 3% spending not yet met

## 2024-01-13 NOTE — Telephone Encounter (Signed)
 Spoke to [employer] again regarding app. Patient's OOP for prescriptions was not sent in. Spoke to patient to see if they have this info. Patient has spent about half of what they need to in order to get approved. Advised patient that based on POI, 3% should be expected to spend $160.71. Patient has spent $75 so far and will need to spend another $85.71 in order to meet the 3% spending requirement. Patient states she should meet it when she picks up her next $75 script. Advised her to request the OOP print out once that refill has been picked up. Don't have an official denial yet, but pt is likely to get denied until OOP has been provided. Pt is aware. Noting she may need more samples until this is resolved.

## 2024-01-17 ENCOUNTER — Telehealth: Payer: Self-pay

## 2024-01-17 MED ORDER — APIXABAN 5 MG PO TABS: 5.0000 mg | ORAL_TABLET | Freq: Two times a day (BID) | ORAL | Status: DC

## 2024-01-17 NOTE — Telephone Encounter (Signed)
 Pt presented to office reports was told could have 1 more dispense of Eliquis samples d/t having DCCV on 01/18/24.  Pt reports was told this by Dorathy Daft pt assistance coordinator.  Advised pt this will be the 3 rd time we have provided with samples will need to contact insurance company regarding cost and possibly splitting cost of deductible up over a year.  Pt reports has to pay $75-$175 to meet 3% out of pocket to qualify for pt assistance.  Advised pt will give 2 more weeks of samples but this will be the last time.

## 2024-01-17 NOTE — Progress Notes (Signed)
 Called patient with pre-procedure instructions for tomorrow. Left detailed voicemail of with procedure instructions.  Patient informed of:   Time to arrive for procedure. 0630 Remain NPO past midnight.  Must have a ride home and a responsible adult to remain with them for 24 hours post procedure.  Confirmed blood thinner.Eliquis Confirmed no breaks in taking blood thinner for 3+ weeks prior to procedure. Confirmed patient stopped all GLP-1s and GLP-2s for at least one week before procedure.Jardiance

## 2024-01-17 NOTE — Anesthesia Preprocedure Evaluation (Signed)
 Anesthesia Evaluation  Patient identified by MRN, date of birth, ID band Patient awake    Reviewed: Allergy & Precautions, NPO status , Patient's Chart, lab work & pertinent test results  History of Anesthesia Complications (+) PONV and history of anesthetic complications  Airway Mallampati: II  TM Distance: >3 FB Neck ROM: Full    Dental no notable dental hx.    Pulmonary sleep apnea    Pulmonary exam normal        Cardiovascular hypertension, +CHF  + dysrhythmias Atrial Fibrillation + pacemaker  Rhythm:Irregular Rate:Normal   1. Left ventricular ejection fraction, by estimation, is 55 to 60%. The  left ventricle has normal function. The left ventricle has no regional  wall motion abnormalities. Left ventricular diastolic parameters were  normal.   2. Right ventricular systolic function is normal. The right ventricular  size is normal. There is mildly elevated pulmonary artery systolic  pressure.   3. Mild to moderate mitral valve regurgitation.   4. Tricuspid valve regurgitation is mild to moderate.   5. Aortic valve regurgitation is trivial.   6. The inferior vena cava is dilated in size with >50% respiratory  variability, suggesting right atrial pressure of 8 mmHg.     Neuro/Psych   Anxiety Depression    negative neurological ROS     GI/Hepatic Neg liver ROS, hiatal hernia,GERD  ,,  Endo/Other  diabetes, Type 2Hypothyroidism    Renal/GU      Musculoskeletal  (+) Arthritis , Osteoarthritis,    Abdominal Normal abdominal exam  (+)   Peds  Hematology negative hematology ROS (+)   Anesthesia Other Findings   Reproductive/Obstetrics                             Anesthesia Physical Anesthesia Plan  ASA: 3  Anesthesia Plan: General   Post-op Pain Management:    Induction: Intravenous  PONV Risk Score and Plan: 4 or greater  Airway Management Planned: Mask  Additional  Equipment: None  Intra-op Plan:   Post-operative Plan:   Informed Consent: I have reviewed the patients History and Physical, chart, labs and discussed the procedure including the risks, benefits and alternatives for the proposed anesthesia with the patient or authorized representative who has indicated his/her understanding and acceptance.     Dental advisory given  Plan Discussed with: CRNA  Anesthesia Plan Comments:        Anesthesia Quick Evaluation

## 2024-01-18 ENCOUNTER — Encounter (HOSPITAL_COMMUNITY): Payer: Self-pay | Admitting: Anesthesiology

## 2024-01-18 ENCOUNTER — Ambulatory Visit (HOSPITAL_COMMUNITY)
Admission: RE | Admit: 2024-01-18 | Discharge: 2024-01-18 | Disposition: A | Discharge status: Home / Self Care | Source: Ambulatory Visit | Attending: Internal Medicine | Admitting: Internal Medicine

## 2024-01-18 ENCOUNTER — Other Ambulatory Visit: Payer: Self-pay

## 2024-01-18 ENCOUNTER — Encounter (HOSPITAL_COMMUNITY)
Admission: RE | Disposition: A | Discharge status: Home / Self Care | Source: Ambulatory Visit | Attending: Internal Medicine

## 2024-01-18 DIAGNOSIS — Z01818 Encounter for other preprocedural examination: Secondary | ICD-10-CM

## 2024-01-18 DIAGNOSIS — Z538 Procedure and treatment not carried out for other reasons: Secondary | ICD-10-CM | POA: Insufficient documentation

## 2024-01-18 LAB — GLUCOSE, CAPILLARY: Glucose-Capillary: 133 mg/dL — ABNORMAL HIGH (ref 70–99)

## 2024-01-18 SURGERY — CARDIOVERSION (CATH LAB)
Anesthesia: General

## 2024-01-18 MED ORDER — SODIUM CHLORIDE 0.9 % IV SOLN
INTRAVENOUS | Status: DC
Start: 2024-01-18 — End: 2024-01-18

## 2024-01-18 MED ORDER — FENTANYL CITRATE (PF) 100 MCG/2ML IJ SOLN: 25.0000 ug | INTRAMUSCULAR | Status: DC | PRN

## 2024-01-18 NOTE — H&P (Signed)
 Procedure canceled due to being A paced ; V sensed. V paced

## 2024-01-18 NOTE — Plan of Care (Signed)
 Procedure canceled. A-paced rhythm

## 2024-01-19 ENCOUNTER — Telehealth: Payer: Self-pay | Admitting: Cardiology

## 2024-01-19 DIAGNOSIS — I4892 Unspecified atrial flutter: Secondary | ICD-10-CM

## 2024-01-19 MED ORDER — APIXABAN 5 MG PO TABS: 5.0000 mg | ORAL_TABLET | Freq: Two times a day (BID) | ORAL | 1 refills | Status: DC

## 2024-01-19 NOTE — Telephone Encounter (Signed)
*  STAT* If patient is at the pharmacy, call can be transferred to refill team.   1. Which medications need to be refilled? (please list name of each medication and dose if known) apixaban (ELIQUIS) 5 MG TABS tablet   2. Which pharmacy/location (including street and city if local pharmacy) is medication to be sent to? [facility] [address] [place], [place] - [address] [phone]   3. Do they need a 30 day or 90 day supply? 90 and also would like a discount card

## 2024-01-19 NOTE — Telephone Encounter (Signed)
 Prescription refill request for Eliquis received. Indication: Aflutter  Last office visit: 12/20/23 Lalla Brothers)  Scr: 0.91 (01/06/24)  Age: 73  Weight: 125.2kg  Appropriate dose. Refill sent.   Attempted to call pt concerning discount card. If she has already used the discount card, she will not be eligible to use it again. No answer, left message on voicemail.

## 2024-01-20 ENCOUNTER — Other Ambulatory Visit (HOSPITAL_COMMUNITY): Payer: Self-pay | Admitting: *Deleted

## 2024-01-20 MED ORDER — CARVEDILOL 6.25 MG PO TABS: ORAL_TABLET | ORAL | 3 refills | Status: AC

## 2024-01-25 ENCOUNTER — Other Ambulatory Visit (HOSPITAL_COMMUNITY): Payer: Self-pay

## 2024-01-25 ENCOUNTER — Telehealth: Payer: Self-pay

## 2024-01-25 MED ORDER — APIXABAN 5 MG PO TABS: 5.0000 mg | ORAL_TABLET | Freq: Two times a day (BID) | ORAL | 3 refills | Status: DC

## 2024-01-25 NOTE — Telephone Encounter (Signed)
 AFL in progress from 4/6, overall controlled rates, Eliquis per [facility] Cardioversion cancelled on 4/2 due to patient back in AP/VP rhythm at the time.   Cc. RAlphonzo Severance PA,

## 2024-01-25 NOTE — Telephone Encounter (Signed)
 Eliquis ordered sent to [facility]

## 2024-01-25 NOTE — Telephone Encounter (Signed)
 Please send in prescription for [facility] to [facility] on [address] per patient's request.

## 2024-01-25 NOTE — Addendum Note (Signed)
 Addended by: Judene Companion on: 01/25/2024 02:21 PM   Modules accepted: Orders

## 2024-01-25 NOTE — Telephone Encounter (Signed)
 Per Jorja Loa PA. Keep scheduled follow up with Dr Lalla Brothers in May. No changes at this time.

## 2024-01-26 ENCOUNTER — Telehealth: Payer: Self-pay

## 2024-01-26 NOTE — Telephone Encounter (Signed)
 Patient called me to let me know she successfully enrolled into a MPP for $50 a month so her Eliquis is now free at the pharmacy.   Patient also expressed concerns regarding a cardioversion procedure being cancelled and stated that she hasn't heard anything from anyone yet regarding next steps. Patient would like clarification as to whether or not this should be rescheduled. Please advise.

## 2024-01-26 NOTE — Telephone Encounter (Signed)
 Spoke with patient, per telephone note from yesterday:  [name], RN    01/25/24  9:02 AM Note Per Jorja Loa PA. Keep scheduled follow up with Dr Lalla Brothers in May. No changes at this time.     No change, keep F/U appt with Dr. Lalla Brothers in May. Patient verbalized understanding and expressed appreciation for call.

## 2024-01-27 ENCOUNTER — Ambulatory Visit: Payer: Medicare Other | Admitting: Podiatry

## 2024-01-27 ENCOUNTER — Encounter: Payer: Self-pay | Admitting: Podiatry

## 2024-01-27 VITALS — Ht 66.0 in | Wt 276.0 lb

## 2024-01-27 DIAGNOSIS — E119 Type 2 diabetes mellitus without complications: Secondary | ICD-10-CM | POA: Diagnosis not present

## 2024-01-27 DIAGNOSIS — B351 Tinea unguium: Secondary | ICD-10-CM | POA: Diagnosis not present

## 2024-01-27 DIAGNOSIS — M79675 Pain in left toe(s): Secondary | ICD-10-CM | POA: Diagnosis not present

## 2024-01-27 NOTE — Progress Notes (Signed)
This patient returns to my office for at risk foot care.  This patient requires this care by a professional since this patient will be at risk due to having diabetes mellitus.and coagulation defect.   This patient is unable to cut nails herself since the patient cannot reach her nails.These nails are painful walking and wearing shoes.  This patient presents for at risk foot care today. ° °General Appearance  Alert, conversant and in no acute stress. ° °Vascular  Dorsalis pedis and posterior tibial  pulses are weakly  palpable  bilaterally.  Capillary return is within normal limits  bilaterally. Temperature is within normal limits  bilaterally. ° °Neurologic  Senn-Weinstein monofilament wire test diminished   bilaterally. Muscle power within normal limits bilaterally. ° °Nails Thick disfigured discolored nails with subungual debris  Hallux nails. No evidence of bacterial infection or drainage bilaterally. ° °Orthopedic  No limitations of motion  feet .  No crepitus or effusions noted.  HAV  B/L.  Hammer toes  B/L.  Haglunds deformity right foot.  Plantar flexed fifth metatarsal  B/L. ° °Skin  normotropic skin  noted bilaterally.  No signs of infections or ulcers noted.   Porokeratosis sub 5th met  B/L asymptomatic. ° °Onychomycosis  Pain in right toes  Pain in left toes   ° °Consent was obtained for treatment procedures.   Mechanical debridement of nails 1-5  bilaterally performed with a nail nipper.  Filed with dremel without incident. ° ° °Return office visit    3 months                  Told patient to return for periodic foot care and evaluation due to potential at risk complications. ° ° °Giamarie Bueche DPM  °

## 2024-01-28 ENCOUNTER — Encounter (HOSPITAL_BASED_OUTPATIENT_CLINIC_OR_DEPARTMENT_OTHER): Payer: Self-pay | Admitting: Cardiology

## 2024-02-15 ENCOUNTER — Telehealth: Payer: Self-pay | Admitting: Cardiology

## 2024-02-15 NOTE — Telephone Encounter (Signed)
   Pre-operative Risk Assessment    Patient Name: Linda Washington  DOB: 30-Sep-1951 MRN: [id number]      Request for Surgical Clearance    Procedure:   Right and index finger  endoscopic trigger finger release  Date of Surgery:  Clearance TBD                                 Surgeon:  Dr. Rober Chimera Surgeon's Group or Practice Name:  [facility]  Phone number:  [phone] Fax number:  [phone]   Type of Clearance Requested:   - Medical  - Pharmacy:  Hold Warfarin (Coumadin ) Def to Cards   Type of Anesthesia:  Local    Additional requests/questions:    Jennine Mohair   02/15/2024, 4:17 PM

## 2024-02-15 NOTE — Progress Notes (Signed)
 Remote pacemaker transmission.

## 2024-02-15 NOTE — Addendum Note (Signed)
 Addended by: Lott Rouleau A on: 02/15/2024 11:43 AM   Modules accepted: Orders

## 2024-02-20 NOTE — Progress Notes (Unsigned)
 Electrophysiology Office Follow up Visit Note:    Date:  02/21/2024   ID:  Linda Washington, DOB [birth year], MRN [id number]  PCP:  Darnelle Elders, PA-C  [facility] Cardiologist:  Dorothye Gathers, MD  [facility] Electrophysiologist:  Will Cortland Ding, MD    Interval History:     Linda Washington is a 73 y.o. female who presents for a follow up visit.   I last saw the patient December 20, 2023 for history of atrial fibrillation.  She has hypertension, diabetes, sleep apnea, chronic diastolic heart failure, hyperlipidemia, obesity and atrial fibrillation/flutter.  She is on warfarin for stroke prophylaxis.  She has a pacemaker was implanted in September 2024 for severe bradycardia.  She has had very difficult to control INR's and watchman was discussed at the last appointment.  Before proceeding with watchman I wanted to get her back in rhythm with a cardioversion.  That was going to help determine whether her rhythm was permanent or just persistent.  She is with her son today in clinic.  She has felt well since changing over to Eliquis .  She is very pleased with the change.  No signs of bleeding.  She presented for her cardioversion on January 18, 2024 but arrived in an atrial paced rhythm.       Past medical, surgical, social and family history were reviewed.  ROS:   Please see the history of present illness.    All other systems reviewed and are negative.  EKGs/Labs/Other Studies Reviewed:    The following studies were reviewed today:  Feb 21, 2024 in-clinic device interrogation personally reviewed Battery and lead parameter stable No sustained episodes of atrial fibrillation following the cardioversion No programming changes made today           Physical Exam:    VS:  BP (!) 112/54   Pulse 64   Ht 5\' 6"  (1.676 m)   Wt 283 lb (128.4 kg)   SpO2 94%   BMI 45.68 kg/m     Wt Readings from Last 3 Encounters:  02/21/24 283 lb (128.4 kg)  01/27/24 276 lb (125.2 kg)   01/18/24 276 lb (125.2 kg)     GEN: no distress.  Obese CARD: RRR, No MRG.  CIED pocket well-healed RESP: No IWOB. CTAB.      ASSESSMENT:    1. Atrial flutter, unspecified type (HCC)   2. Pacemaker   3. Persistent atrial fibrillation (HCC)    PLAN:    In order of problems listed above:  #Persistent atrial fibrillation Long history of atrial fibrillation.  She is maintaining sinus rhythm after the cardioversion.    Her BMI is 45 which complicates catheter ablation and increases the procedural risk.  This was discussed in detail with the patient during today's clinic appointment.  We discussed using antiarrhythmic drugs including amiodarone and dofetilide.  Her QTc during an atrial paced rhythm on April 1 was prohibitively long for Tikosyn.  We discussed the pros and cons of amiodarone use during today's clinic appointment.  If antiarrhythmics are needed in the future, favor starting amiodarone.  For now, given maintaining normal rhythm, will delay starting.  Given her tolerance of Eliquis  and procedural risk profile, favor using anticoagulation over left atrial appendage occlusion.  #Symptomatic bradycardia #Permanent pacemaker in situ Device functioning appropriately.  Continue remote monitoring.   Follow-up 6 months with APP.  If recurrence of arrhythmia, favor amiodarone.  Signed, Harvie Liner, MD, [facility], [facility] 02/21/2024 12:03 PM    Electrophysiology  [facility]

## 2024-02-21 ENCOUNTER — Encounter: Payer: Self-pay | Admitting: Cardiology

## 2024-02-21 ENCOUNTER — Ambulatory Visit: Attending: Cardiology | Admitting: Cardiology

## 2024-02-21 VITALS — BP 112/54 | HR 64 | Ht 66.0 in | Wt 283.0 lb

## 2024-02-21 DIAGNOSIS — Z950 Presence of cardiac pacemaker: Secondary | ICD-10-CM

## 2024-02-21 DIAGNOSIS — I4819 Other persistent atrial fibrillation: Secondary | ICD-10-CM | POA: Diagnosis not present

## 2024-02-21 DIAGNOSIS — I4892 Unspecified atrial flutter: Secondary | ICD-10-CM | POA: Diagnosis not present

## 2024-02-21 LAB — CUP PACEART INCLINIC DEVICE CHECK
Battery Remaining Longevity: 121 mo
Battery Voltage: 2.99 V
Brady Statistic RA Percent Paced: 21 %
Brady Statistic RV Percent Paced: 60 %
Date Time Interrogation Session: 20250505132016
Implantable Lead Connection Status: 753985
Implantable Lead Connection Status: 753985
Implantable Lead Implant Date: 20240906
Implantable Lead Implant Date: 20240906
Implantable Lead Location: 753859
Implantable Lead Location: 753860
Implantable Pulse Generator Implant Date: 20240906
Lead Channel Impedance Value: 375 Ohm
Lead Channel Impedance Value: 450 Ohm
Lead Channel Pacing Threshold Amplitude: 0.5 V
Lead Channel Pacing Threshold Amplitude: 0.5 V
Lead Channel Pacing Threshold Amplitude: 0.75 V
Lead Channel Pacing Threshold Amplitude: 0.75 V
Lead Channel Pacing Threshold Pulse Width: 0.5 ms
Lead Channel Pacing Threshold Pulse Width: 0.5 ms
Lead Channel Pacing Threshold Pulse Width: 0.5 ms
Lead Channel Pacing Threshold Pulse Width: 0.5 ms
Lead Channel Sensing Intrinsic Amplitude: 1.3 mV
Lead Channel Sensing Intrinsic Amplitude: 12 mV
Lead Channel Setting Pacing Amplitude: 0.875
Lead Channel Setting Pacing Amplitude: 2.5 V
Lead Channel Setting Pacing Pulse Width: 0.5 ms
Lead Channel Setting Sensing Sensitivity: 2 mV
Pulse Gen Model: 2272
Pulse Gen Serial Number: 8206505

## 2024-02-21 NOTE — Patient Instructions (Signed)
 Medication Instructions:  Your physician recommends that you continue on your current medications as directed. Please refer to the Current Medication list given to you today.  *If you need a refill on your cardiac medications before your next appointment, please call your pharmacy*  Follow-Up: At [facility], you and your health needs are our priority.  As part of our continuing mission to provide you with exceptional heart care, our providers are all part of one team.  This team includes your primary Cardiologist (physician) and Advanced Practice Providers or APPs (Physician Assistants and Nurse Practitioners) who all work together to provide you with the care you need, when you need it.  Your next appointment:   6 months  Provider:   You will see one of the following Advanced Practice Providers on your designated Care Team:   Mertha Abrahams, Kennard Pea [address] [place], PA-C Suzann Riddle, NP Creighton Doffing, NP

## 2024-02-21 NOTE — Telephone Encounter (Signed)
 Patient with diagnosis of atrial flutter on Eliquis  for anticoagulation.    Procedure:   Right and index finger  endoscopic trigger finger release   Date of Surgery:  Clearance TBD    CHA2DS2-VASc Score = 6   This indicates a 9.7% annual risk of stroke. The patient's score is based upon: CHF History: 1 HTN History: 1 Diabetes History: 1 Stroke History: 0 Vascular Disease History: 0 Age Score: 2 Gender Score: 1    CrCl 72 (with adjusted body weight) Platelet count 347  Patient has not had an Afib/aflutter ablation within the last 3 months or DCCV within the last 30 days  Per office protocol, patient can hold Eliquis  for 2 days prior to procedure.   Patient will not need bridging with Lovenox (enoxaparin) around procedure.  **This guidance is not considered finalized until pre-operative APP has relayed final recommendations.**

## 2024-02-21 NOTE — Telephone Encounter (Signed)
   Primary Cardiologist: Dorothye Gathers, MD  Chart reviewed as part of pre-operative protocol coverage. Given past medical history and time since last visit, based on ACC/AHA guidelines, Linda Washington would be at acceptable risk for the planned procedure without further cardiovascular testing.   Patient was advised that if she develops new symptoms prior to surgery to contact our office to arrange a follow-up appointment.  He verbalized understanding.  Per office protocol, patient can hold Eliquis  for 2 days prior to procedure.   Patient will not need bridging with Lovenox (enoxaparin) around procedure.  I will route this recommendation to the requesting party via Epic fax function and remove from pre-op  pool.  Please call with questions.  Gerldine Koch, NP-C  02/21/2024, 12:15 PM [address] [place], [place] [address] Office [phone] Fax [phone]

## 2024-02-28 ENCOUNTER — Telehealth: Payer: Self-pay

## 2024-02-28 NOTE — Telephone Encounter (Signed)
 Alert received from [employer] for 1  AMS event starting on 02/24/24 @ 0626am. AF continues @ time  of transmission (02/26/24). On OAC per chart.  Recent DCCV 01/18/2024.   Routing to Dr. Marven Slimmer to advise further.   Last OV with Dr. Marven Slimmer 02/21/24 states if patient went back into AF, favor amiodarone.

## 2024-03-02 ENCOUNTER — Telehealth (HOSPITAL_BASED_OUTPATIENT_CLINIC_OR_DEPARTMENT_OTHER): Payer: Self-pay

## 2024-03-02 NOTE — Telephone Encounter (Signed)
 Copied from CRM [id number]. Topic: General - Other >> Mar 02, 2024  9:36 AM Eveleen Hinds B wrote: Reason for CRM:  Patient has upcoming surgery 03/08/2024 and [facility] needs medical clearance.  Carolynne Citron [phone]

## 2024-03-03 NOTE — Telephone Encounter (Addendum)
 I have no clearance request for her  I called Thea at the number requested and there was no answer- LMTCB  I have provided my fax number for form to be sent

## 2024-03-06 ENCOUNTER — Encounter: Payer: Self-pay | Admitting: Pulmonary Disease

## 2024-03-06 NOTE — Progress Notes (Signed)
 [facility] Washington Pulmonary note Peri-operative Assessment of Pulmonary Risk for Non-Thoracic Surgery:  Asked to provide pulmonary evaluation for planned procedure, right index and long finger trigger release.  Patient has history of OSA on CPAP, mild asthma.  She has good control and does not need any long-term controller medication with.  Per ARISCAT Score she has Low risk- 1.6% risk of in-hospital post-op pulmonary complications (composite including respiratory failure, respiratory infection, pleural effusion, atelectasis, pneumothorax, bronchospasm, aspiration pneumonitis)    ForMs. Linda Washington, risk of perioperative pulmonary complications is increased by:  Age greater than 65 years  Obstructive sleep apnea    Respiratory complications generally occur in 1% of ASA Class I patients, 5% of ASA Class II and 10% of ASA Class III-IV patients These complications rarely result in mortality and include postoperative pneumonia, atelectasis, pulmonary embolism, ARDS and increased time requiring postoperative mechanical ventilation.  Overall, I recommend proceeding with the surgery if the risk for respiratory complications are outweighed by the potential benefits. This will need to be discussed between the patient and surgeon.  To reduce risks of respiratory complications, I recommend: --Pre- and post-operative incentive spirometry performed frequently while awake --Inpatient use of currently prescribed positive-pressure for OSA whenever the patient is sleeping  Linda Bateson MD [facility] 03/06/2024, 4:19 PM

## 2024-03-06 NOTE — Telephone Encounter (Signed)
 Fax received from Dr. Randene Bustard  with [facility] (fax- [phone]) to perform a right index and long finger trigger releases on patient.  Patient needs surgery clearance. Surgery is 03/08/24. Patient was seen on 10/27/23. Office protocol is a risk assessment can be sent to surgeon if patient has been seen in 60 days or less.   Sending to Dr Waylan Haggard for risk assessment or recommendations if patient needs to be seen in office prior to surgical procedure.

## 2024-03-07 NOTE — Telephone Encounter (Signed)
 Risk assessment was faxed to [facility] Confirmation recieved

## 2024-03-07 NOTE — Telephone Encounter (Signed)
 Called patient to advise AF was noted and Dr. Marven Slimmer recommends AF clinic referral to discuss start Amiodarone. Patient is agreeable.

## 2024-03-07 NOTE — Telephone Encounter (Signed)
 I put in a separate note with preop assessment.

## 2024-03-08 DIAGNOSIS — M65321 Trigger finger, right index finger: Secondary | ICD-10-CM | POA: Diagnosis not present

## 2024-03-08 DIAGNOSIS — M65331 Trigger finger, right middle finger: Secondary | ICD-10-CM | POA: Diagnosis not present

## 2024-03-23 ENCOUNTER — Encounter (HOSPITAL_COMMUNITY): Payer: Self-pay | Admitting: Physician Assistant

## 2024-03-23 ENCOUNTER — Ambulatory Visit (HOSPITAL_COMMUNITY)
Admission: RE | Admit: 2024-03-23 | Discharge: 2024-03-23 | Disposition: A | Discharge status: Home / Self Care | Source: Ambulatory Visit | Attending: Physician Assistant | Admitting: Physician Assistant

## 2024-03-23 VITALS — BP 132/74 | HR 63 | Ht 66.0 in | Wt 285.2 lb

## 2024-03-23 DIAGNOSIS — I4819 Other persistent atrial fibrillation: Secondary | ICD-10-CM | POA: Diagnosis not present

## 2024-03-23 DIAGNOSIS — I484 Atypical atrial flutter: Secondary | ICD-10-CM

## 2024-03-23 DIAGNOSIS — D6869 Other thrombophilia: Secondary | ICD-10-CM

## 2024-03-23 DIAGNOSIS — I480 Paroxysmal atrial fibrillation: Secondary | ICD-10-CM | POA: Diagnosis not present

## 2024-03-23 DIAGNOSIS — Z79899 Other long term (current) drug therapy: Secondary | ICD-10-CM

## 2024-03-23 NOTE — Patient Instructions (Signed)
 Cymbalta  Call our office once citalopram  has switched to start amiodarone

## 2024-03-23 NOTE — Progress Notes (Signed)
 Primary Care Physician: Darnelle Elders, PA-C Primary Cardiologist: Dorothye Gathers, MD Electrophysiologist: Boyce Byes, MD  Referring Physician: ED     Linda Washington is a 73 y.o. female with a history of HTN, T2DM, OSA, chronic diastolic heart failure, HLD, obesity, and rate-controlled atrial flutter who presents for follow up in the [facility]. Seen in ED on 05/16/23 for weakness, bradycardia, and SOB; patient noting at home HR 30-40s normally in the 60s. Heart rates noted in the ED as low as 19-23; she did not require emergent procedure and was discharged home. Carvedilol  held per cardiology. 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 She was changed from warfarin to Eliquis  due to labile INRs.   Patient returns for follow up for atrial fibrillation. The device clinic received an alert for an increased afib burden on  5/12. She is in AV dual paced rhythm today. There were no specific triggers that she could identify. No bleeding issues on anticoagulation.   Today, she  denies symptoms of palpitations, chest pain, shortness of breath, orthopnea, PND, lower extremity edema, dizziness, presyncope, syncope, snoring, daytime somnolence, bleeding, or neurologic sequela. The patient is tolerating medications without difficulties and is otherwise without complaint today.    Current Outpatient Medications  Medication Sig Dispense Refill   acetaminophen  (TYLENOL ) 650 MG CR tablet Take 650 mg by mouth every 8 (eight) hours as needed for pain.     apixaban  (ELIQUIS ) 5 MG TABS tablet Take 1 tablet (5 mg total) by mouth 2 (two) times daily. 180 tablet 3   atorvastatin  (LIPITOR) 40 MG tablet Take 1 tablet (40 mg total) by mouth at bedtime. 90 tablet 3   carvedilol  (COREG ) 6.25 MG tablet Take 2 tablets in the AM and 1 tablet in the PM 270 tablet 3   citalopram   (CELEXA ) 20 MG tablet Take 20 mg by mouth daily.     cyanocobalamin  (VITAMIN B12) 1000 MCG tablet Take 1,000 mcg by mouth daily.     Dulaglutide (TRULICITY) 1.5 MG/0.5ML SOAJ Inject 1.5 mg into the skin once a week.     empagliflozin  (JARDIANCE ) 25 MG TABS tablet Take 25 mg by mouth daily.     fluticasone (FLONASE) 50 MCG/ACT nasal spray Place 1-2 sprays into both nostrils daily as needed for allergies or rhinitis.     furosemide  (LASIX ) 40 MG tablet TAKE 1 TABLET EVERY DAY 90 tablet 2   insulin  glargine (LANTUS) 100 UNIT/ML injection Inject 10 Units into the skin daily.     metFORMIN  (GLUCOPHAGE ) 1000 MG tablet Take 1,000 mg by mouth 2 (two) times daily with a meal.     NOVOLOG  FLEXPEN 100 UNIT/ML FlexPen Inject 4 Units into the skin daily as needed (blood sugar above 200).     omeprazole (PRILOSEC) 40 MG capsule Take 40 mg by mouth daily.     oxybutynin  (DITROPAN  XL) 15 MG 24 hr tablet Take 15 mg by mouth daily.     Polyethyl Glycol-Propyl Glycol (SYSTANE OP) Place 1-2 drops into both eyes daily as needed (dry eyes).     Current Facility-Administered Medications  Medication Dose Route Frequency Provider Last Rate Last Admin   betamethasone  acetate-betamethasone  sodium phosphate (CELESTONE ) injection 3 mg  3 mg Intra-articular Once Evans, Brent M, DPM        Atrial Fibrillation Management history:  Previous antiarrhythmic drugs: None Previous  cardioversions: 06/22/2018 Previous ablations: None Anticoagulation history: warfarin, Eliquis    ROS- All systems are reviewed and negative except as per the HPI above.  Physical Exam: BP 132/74   Pulse 63   Ht 5\' 6"  (1.676 m)   Wt 129.4 kg   BMI 46.03 kg/m   GEN: Well nourished, well developed in no acute distress CARDIAC: Regular rate and rhythm, no murmurs, rubs, gallops RESPIRATORY:  Clear to auscultation without rales, wheezing or rhonchi  ABDOMEN: Soft, non-tender, non-distended EXTREMITIES:  No edema; No deformity    EKG today  demonstrates  AV dual paced rhythm, PVC Vent. rate 63 BPM PR interval * ms QRS duration 90 ms QT/QTcB 442/452 ms   Echo 01/01/23 demonstrated   1. Left ventricular ejection fraction, by estimation, is 55 to 60%. The  left ventricle has normal function. The left ventricle has no regional  wall motion abnormalities. Left ventricular diastolic parameters were  normal.   2. Right ventricular systolic function is normal. The right ventricular  size is normal. There is mildly elevated pulmonary artery systolic  pressure.   3. Mild to moderate mitral valve regurgitation.   4. Tricuspid valve regurgitation is mild to moderate.   5. Aortic valve regurgitation is trivial.   6. The inferior vena cava is dilated in size with >50% respiratory  variability, suggesting right atrial pressure of 8 mmHg.    CHA2DS2-VASc Score = 6  The patient's score is based upon: CHF History: 1 HTN History: 1 Diabetes History: 1 Stroke History: 0 Vascular Disease History: 0 Age Score: 2 Gender Score: 1       ASSESSMENT AND PLAN: Persistent Atrial Fibrillation/atrial flutter The patient's CHA2DS2-VASc score is 6, indicating a 9.7% annual risk of stroke.   Patient in SR today but having increased afib burden recently. We discussed rhythm control options. Will plan to start amiodarone 200 mg BID x 2 weeks then decrease to once daily. She is on citalopram , she will need to discontinue this to start on amiodarone. She plans to reach out to her PCP about switching to Cymbalta. She will call the clinic when she has been switched. Check cmet/TSH today. Continue Eliquis  5 mg BID Continue carvedilol  12.5 mg AM and 6.25 mg PM  Secondary Hypercoagulable State (ICD10:  D68.69) The patient is at significant risk for stroke/thromboembolism based upon her CHA2DS2-VASc Score of 6.  Continue Apixaban  (Eliquis ). No bleeding issues.   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  Encouraged lifestyle modification   Follow up in the AF clinic two weeks after amiodarone start.     Myrtha Ates PA-C [facility] [address] [place], [place] [address] [phone]

## 2024-03-25 LAB — COMPREHENSIVE METABOLIC PANEL WITH GFR
ALT: 14 IU/L (ref 0–32)
AST: 16 IU/L (ref 0–40)
Albumin: 4 g/dL (ref 3.8–4.8)
Alkaline Phosphatase: 82 IU/L (ref 44–121)
BUN/Creatinine Ratio: 14 (ref 12–28)
BUN: 12 mg/dL (ref 8–27)
Bilirubin Total: 0.6 mg/dL (ref 0.0–1.2)
CO2: 18 mmol/L — ABNORMAL LOW (ref 20–29)
Calcium: 9.2 mg/dL (ref 8.7–10.3)
Chloride: 105 mmol/L (ref 96–106)
Creatinine, Ser: 0.88 mg/dL (ref 0.57–1.00)
Globulin, Total: 2.4 g/dL (ref 1.5–4.5)
Glucose: 116 mg/dL — ABNORMAL HIGH (ref 70–99)
Potassium: 4.3 mmol/L (ref 3.5–5.2)
Sodium: 141 mmol/L (ref 134–144)
Total Protein: 6.4 g/dL (ref 6.0–8.5)
eGFR: 68 mL/min/{1.73_m2} (ref >59)

## 2024-03-25 LAB — TSH: TSH: 2.18 u[IU]/mL (ref 0.450–4.500)

## 2024-03-27 ENCOUNTER — Ambulatory Visit (HOSPITAL_COMMUNITY): Payer: Self-pay | Admitting: Physician Assistant

## 2024-03-28 ENCOUNTER — Ambulatory Visit (INDEPENDENT_AMBULATORY_CARE_PROVIDER_SITE_OTHER): Payer: Medicare HMO

## 2024-03-28 DIAGNOSIS — I495 Sick sinus syndrome: Secondary | ICD-10-CM | POA: Diagnosis not present

## 2024-03-29 LAB — CUP PACEART REMOTE DEVICE CHECK
Battery Remaining Longevity: 109 mo
Battery Remaining Percentage: 95 %
Battery Voltage: 3.01 V
Brady Statistic AP VP Percent: 11 %
Brady Statistic AP VS Percent: 73 %
Brady Statistic AS VP Percent: 6 %
Brady Statistic AS VS Percent: 8.7 %
Brady Statistic RA Percent Paced: 30 %
Brady Statistic RV Percent Paced: 55 %
Date Time Interrogation Session: 20250610035714
Implantable Lead Connection Status: 753985
Implantable Lead Connection Status: 753985
Implantable Lead Implant Date: 20240906
Implantable Lead Implant Date: 20240906
Implantable Lead Location: 753859
Implantable Lead Location: 753860
Implantable Pulse Generator Implant Date: 20240906
Lead Channel Impedance Value: 380 Ohm
Lead Channel Impedance Value: 440 Ohm
Lead Channel Pacing Threshold Amplitude: 0.5 V
Lead Channel Pacing Threshold Amplitude: 0.75 V
Lead Channel Pacing Threshold Pulse Width: 0.5 ms
Lead Channel Pacing Threshold Pulse Width: 0.5 ms
Lead Channel Sensing Intrinsic Amplitude: 1.1 mV
Lead Channel Sensing Intrinsic Amplitude: 12 mV
Lead Channel Setting Pacing Amplitude: 1 V
Lead Channel Setting Pacing Amplitude: 2.5 V
Lead Channel Setting Pacing Pulse Width: 0.5 ms
Lead Channel Setting Sensing Sensitivity: 2 mV
Pulse Gen Model: 2272
Pulse Gen Serial Number: 8206505

## 2024-03-31 ENCOUNTER — Ambulatory Visit: Payer: Self-pay | Admitting: Cardiology

## 2024-04-10 ENCOUNTER — Telehealth (HOSPITAL_COMMUNITY): Payer: Self-pay | Admitting: *Deleted

## 2024-04-10 MED ORDER — AMIODARONE HCL 200 MG PO TABS: ORAL_TABLET | ORAL | 1 refills | Status: DC

## 2024-04-10 NOTE — Telephone Encounter (Signed)
 Pt called wanting to proceed with amiodarone. Discussed with Ricky Fenton P.A. he agrees and ordered Amiodarone 200 mg bid x 2 weeks then 200 mg once daily. Discussed the plan with patient she verbalized understand. Follow up appt made.

## 2024-04-13 ENCOUNTER — Telehealth: Payer: Self-pay

## 2024-04-13 NOTE — Telephone Encounter (Signed)
 Alert remote transmission:  Repeat HVR Event occurred 6/25 @ 13:07, duration 29sec, HR 177, EGM c/w atrial driven tachycardia triggered by 20 cycles at 175 bpm  This is the 4th HVR since 6/21  Patient said she started Amio 2 days ago.  No longer is on Carvedilol .  Denies any symptoms other than ongoing fatigue.  Forwarding to R. Fenton in the AF clinic and copying Dr. Cindie if anything further other than continued monitoring.

## 2024-04-13 NOTE — Telephone Encounter (Addendum)
 Pt should still be on carvedilol  this was never dicussed to stop. I did call pt and clarify that she should not stop it. She needs to resume her normal routine with it. She verbalized understanding not to stop and will start this evening.

## 2024-04-25 DIAGNOSIS — E1142 Type 2 diabetes mellitus with diabetic polyneuropathy: Secondary | ICD-10-CM | POA: Diagnosis not present

## 2024-04-25 DIAGNOSIS — G4730 Sleep apnea, unspecified: Secondary | ICD-10-CM | POA: Diagnosis not present

## 2024-04-25 DIAGNOSIS — I10 Essential (primary) hypertension: Secondary | ICD-10-CM | POA: Diagnosis not present

## 2024-04-25 DIAGNOSIS — N3281 Overactive bladder: Secondary | ICD-10-CM | POA: Diagnosis not present

## 2024-04-25 DIAGNOSIS — E039 Hypothyroidism, unspecified: Secondary | ICD-10-CM | POA: Diagnosis not present

## 2024-04-25 DIAGNOSIS — E7800 Pure hypercholesterolemia, unspecified: Secondary | ICD-10-CM | POA: Diagnosis not present

## 2024-04-25 DIAGNOSIS — I4819 Other persistent atrial fibrillation: Secondary | ICD-10-CM | POA: Diagnosis not present

## 2024-04-27 ENCOUNTER — Ambulatory Visit: Admitting: Podiatry

## 2024-04-27 ENCOUNTER — Encounter: Payer: Self-pay | Admitting: Podiatry

## 2024-04-27 DIAGNOSIS — M79675 Pain in left toe(s): Secondary | ICD-10-CM

## 2024-04-27 DIAGNOSIS — B351 Tinea unguium: Secondary | ICD-10-CM

## 2024-04-27 DIAGNOSIS — E119 Type 2 diabetes mellitus without complications: Secondary | ICD-10-CM

## 2024-04-27 NOTE — Progress Notes (Signed)
This patient returns to my office for at risk foot care.  This patient requires this care by a professional since this patient will be at risk due to having diabetes mellitus.and coagulation defect.   This patient is unable to cut nails herself since the patient cannot reach her nails.These nails are painful walking and wearing shoes.  This patient presents for at risk foot care today. ° °General Appearance  Alert, conversant and in no acute stress. ° °Vascular  Dorsalis pedis and posterior tibial  pulses are weakly  palpable  bilaterally.  Capillary return is within normal limits  bilaterally. Temperature is within normal limits  bilaterally. ° °Neurologic  Senn-Weinstein monofilament wire test diminished   bilaterally. Muscle power within normal limits bilaterally. ° °Nails Thick disfigured discolored nails with subungual debris  Hallux nails. No evidence of bacterial infection or drainage bilaterally. ° °Orthopedic  No limitations of motion  feet .  No crepitus or effusions noted.  HAV  B/L.  Hammer toes  B/L.  Haglunds deformity right foot.  Plantar flexed fifth metatarsal  B/L. ° °Skin  normotropic skin  noted bilaterally.  No signs of infections or ulcers noted.   Porokeratosis sub 5th met  B/L asymptomatic. ° °Onychomycosis  Pain in right toes  Pain in left toes   ° °Consent was obtained for treatment procedures.   Mechanical debridement of nails 1-5  bilaterally performed with a nail nipper.  Filed with dremel without incident. ° ° °Return office visit    3 months                  Told patient to return for periodic foot care and evaluation due to potential at risk complications. ° ° °Giamarie Bueche DPM  °

## 2024-05-02 ENCOUNTER — Encounter (HOSPITAL_COMMUNITY): Payer: Self-pay | Admitting: Physician Assistant

## 2024-05-02 ENCOUNTER — Ambulatory Visit (HOSPITAL_COMMUNITY)
Admission: RE | Admit: 2024-05-02 | Discharge: 2024-05-02 | Disposition: A | Discharge status: Home / Self Care | Source: Ambulatory Visit | Attending: Physician Assistant | Admitting: Physician Assistant

## 2024-05-02 ENCOUNTER — Other Ambulatory Visit (HOSPITAL_COMMUNITY): Payer: Self-pay

## 2024-05-02 VITALS — BP 136/82 | HR 78 | Ht 66.0 in | Wt 272.2 lb

## 2024-05-02 DIAGNOSIS — Z5181 Encounter for therapeutic drug level monitoring: Secondary | ICD-10-CM | POA: Diagnosis not present

## 2024-05-02 DIAGNOSIS — D6869 Other thrombophilia: Secondary | ICD-10-CM | POA: Diagnosis not present

## 2024-05-02 DIAGNOSIS — Z79899 Other long term (current) drug therapy: Secondary | ICD-10-CM

## 2024-05-02 DIAGNOSIS — I484 Atypical atrial flutter: Secondary | ICD-10-CM

## 2024-05-02 DIAGNOSIS — I4892 Unspecified atrial flutter: Secondary | ICD-10-CM

## 2024-05-02 MED ORDER — APIXABAN 5 MG PO TABS: 5.0000 mg | ORAL_TABLET | Freq: Two times a day (BID) | ORAL | Status: AC

## 2024-05-02 NOTE — Progress Notes (Signed)
 Primary Care Physician: Katina Pfeiffer, PA-C Primary Cardiologist: Oneil Parchment, MD Electrophysiologist: OLE ONEIDA HOLTS, MD  Referring Physician: ED     Linda Washington is a 73 y.o. female with a history of HTN, T2DM, OSA, chronic diastolic heart failure, HLD, obesity, and rate-controlled atrial flutter who presents for follow up in the [facility]. Seen in ED on 05/16/23 for weakness, bradycardia, and SOB; patient noting at home HR 30-40s normally in the 60s. Heart rates noted in the ED as low as 19-23; she did not require emergent procedure and was discharged home. Carvedilol  held per cardiology. 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 She was changed from warfarin to Eliquis  due to labile INRs. She was started on amiodarone  04/10/24.  Patient returns for follow up for atrial fibrillation and amiodarone  monitoring. She is in rate controlled atrial flutter. She does have intermittent fatigue but this has been chronic, well before starting amiodarone . No bleeding issues on anticoagulation.   Today, she  denies symptoms of palpitations, chest pain, shortness of breath, orthopnea, PND, lower extremity edema, dizziness, presyncope, syncope, bleeding, or neurologic sequela. The patient is tolerating medications without difficulties and is otherwise without complaint today.    Current Outpatient Medications  Medication Sig Dispense Refill   acetaminophen  (TYLENOL ) 650 MG CR tablet Take 650 mg by mouth every 8 (eight) hours as needed for pain.     amiodarone  (PACERONE ) 200 MG tablet Take 1 tablet (200 mg total) by mouth 2 (two) times daily for 14 days, THEN 1 tablet (200 mg total) daily. 60 tablet 1   apixaban  (ELIQUIS ) 5 MG TABS tablet Take 1 tablet (5 mg total) by mouth 2 (two) times daily. 180 tablet 3   atorvastatin  (LIPITOR) 40 MG tablet Take 1 tablet (40  mg total) by mouth at bedtime. 90 tablet 3   carvedilol  (COREG ) 6.25 MG tablet Take 2 tablets in the AM and 1 tablet in the PM 270 tablet 3   cyanocobalamin  (VITAMIN B12) 1000 MCG tablet Take 1,000 mcg by mouth daily.     Dulaglutide (TRULICITY) 1.5 MG/0.5ML SOAJ Inject 1.5 mg into the skin once a week.     DULoxetine (CYMBALTA) 30 MG capsule Take 30 mg by mouth daily.     empagliflozin  (JARDIANCE ) 25 MG TABS tablet Take 25 mg by mouth daily.     fluticasone (FLONASE) 50 MCG/ACT nasal spray Place 1-2 sprays into both nostrils daily as needed for allergies or rhinitis.     furosemide  (LASIX ) 40 MG tablet TAKE 1 TABLET EVERY DAY 90 tablet 2   insulin  glargine (LANTUS) 100 UNIT/ML injection Inject 10 Units into the skin daily.     metFORMIN  (GLUCOPHAGE ) 1000 MG tablet Take 1,000 mg by mouth 2 (two) times daily with a meal.     NOVOLOG  FLEXPEN 100 UNIT/ML FlexPen Inject 4 Units into the skin daily as needed (blood sugar above 200).     omeprazole (PRILOSEC) 40 MG capsule Take 40 mg by mouth daily.     oxybutynin  (DITROPAN  XL) 15 MG 24 hr tablet Take 15 mg by mouth daily.     Polyethyl Glycol-Propyl Glycol (SYSTANE OP) Place 1-2 drops into both eyes daily as needed (dry eyes).     Current Facility-Administered Medications  Medication Dose Route Frequency Provider Last Rate Last Admin   betamethasone  acetate-betamethasone  sodium phosphate (CELESTONE ) injection 3  mg  3 mg Intra-articular Once Evans, Brent M, DPM        Atrial Fibrillation Management history:  Previous antiarrhythmic drugs: amiodarone   Previous cardioversions: 06/22/2018 Previous ablations: None Anticoagulation history: warfarin, Eliquis    ROS- All systems are reviewed and negative except as per the HPI above.  Physical Exam: BP 136/82   Pulse 78   Ht 5' 6 (1.676 m)   Wt 123.5 kg   BMI 43.93 kg/m   GEN: Well nourished, well developed in no acute distress CARDIAC: Regular rate and rhythm, no murmurs, rubs,  gallops RESPIRATORY:  Clear to auscultation without rales, wheezing or rhonchi  ABDOMEN: Soft, non-tender, non-distended EXTREMITIES:  No edema; No deformity    EKG today demonstrates  Atypical atrial flutter with 2:1 block Vent. rate 78 BPM PR interval 232 ms QRS duration 98 ms QT/QTcB 380/433 ms   Echo 01/01/23 demonstrated   1. Left ventricular ejection fraction, by estimation, is 55 to 60%. The  left ventricle has normal function. The left ventricle has no regional  wall motion abnormalities. Left ventricular diastolic parameters were  normal.   2. Right ventricular systolic function is normal. The right ventricular  size is normal. There is mildly elevated pulmonary artery systolic  pressure.   3. Mild to moderate mitral valve regurgitation.   4. Tricuspid valve regurgitation is mild to moderate.   5. Aortic valve regurgitation is trivial.   6. The inferior vena cava is dilated in size with >50% respiratory  variability, suggesting right atrial pressure of 8 mmHg.    CHA2DS2-VASc Score = 6  The patient's score is based upon: CHF History: 1 HTN History: 1 Diabetes History: 1 Stroke History: 0 Vascular Disease History: 0 Age Score: 2 Gender Score: 1       ASSESSMENT AND PLAN: Persistent Atrial Fibrillation/atrial flutter (ICD10:  I48.19) The patient's CHA2DS2-VASc score is 6, indicating a 9.7% annual risk of stroke.   Patient in atrial flutter today. Will reassess burden as she continues to load on amiodarone . Remote PPM check scheduled for 9/9. Continue amiodarone  200 mg daily Continue Eliquis  5 mg BID Continue carvedilol  12.5 mg AM and 6.25 mg PM  Secondary Hypercoagulable State (ICD10:  D68.69) The patient is at significant risk for stroke/thromboembolism based upon her CHA2DS2-VASc Score of 6.  Continue Apixaban  (Eliquis ). No bleeding issues.   High Risk Medication Monitoring (ICD 10: Z79.899) Intervals on ECG acceptable for amiodarone  monitoring. Check  cmet/TSH at next follow up.   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   HTN Stable on current regimen  OSA  Did not tolerate CPAP  Obesity Body mass index is 43.93 kg/m.  Encouraged lifestyle modification   Follow up in the AF clinic in 2 months.     Daril Kicks PA-C [facility] [address] [place], [place] [address] [phone]

## 2024-05-03 ENCOUNTER — Other Ambulatory Visit (HOSPITAL_COMMUNITY): Payer: Self-pay | Admitting: Physician Assistant

## 2024-05-03 DIAGNOSIS — Z794 Long term (current) use of insulin: Secondary | ICD-10-CM | POA: Diagnosis not present

## 2024-05-03 DIAGNOSIS — E1142 Type 2 diabetes mellitus with diabetic polyneuropathy: Secondary | ICD-10-CM | POA: Diagnosis not present

## 2024-05-22 ENCOUNTER — Ambulatory Visit (HOSPITAL_BASED_OUTPATIENT_CLINIC_OR_DEPARTMENT_OTHER): Admitting: Cardiology

## 2024-05-22 ENCOUNTER — Encounter (HOSPITAL_BASED_OUTPATIENT_CLINIC_OR_DEPARTMENT_OTHER): Payer: Self-pay | Admitting: Cardiology

## 2024-05-22 VITALS — BP 124/60 | HR 79 | Ht 66.0 in | Wt 269.4 lb

## 2024-05-22 DIAGNOSIS — I484 Atypical atrial flutter: Secondary | ICD-10-CM | POA: Diagnosis not present

## 2024-05-22 DIAGNOSIS — Z950 Presence of cardiac pacemaker: Secondary | ICD-10-CM | POA: Diagnosis not present

## 2024-05-22 DIAGNOSIS — D6869 Other thrombophilia: Secondary | ICD-10-CM | POA: Diagnosis not present

## 2024-05-22 DIAGNOSIS — I10 Essential (primary) hypertension: Secondary | ICD-10-CM

## 2024-05-22 NOTE — Patient Instructions (Signed)

## 2024-05-22 NOTE — Progress Notes (Signed)
 Cardiology Office Note:  .   Date:  05/22/2024  ID:  Linda Washington, DOB 02-Jun-1951, MRN [id number] PCP: Katina Pfeiffer, PA-C  [facility] Providers Cardiologist:  Oneil Parchment, MD Electrophysiologist:  OLE ONEIDA HOLTS, MD     History of Present Illness: .   Linda Washington is a 73 y.o. female Discussed the use of AI scribe software for clinical note transcription with the patient, who gave verbal consent to proceed.  History of Present Illness Linda Washington is a 73 year old female with atrial flutter who presents for follow-up on rate control.  She was started on amiodarone  on April 10, 2024, and had a pacemaker placed in September 2024 after pauses of four seconds were noted on a monitor.  Her echocardiogram in 2024 showed an ejection fraction of 60%. She continues to take Eliquis  5 mg twice a day, carvedilol  12.5 mg in the morning and 6.25 mg in the evening, atorvastatin  40 mg, and furosemide  as needed. She also takes Jardiance  for diabetes and reports some weight loss with its use.  She feels tired all the time and attributes it to her heart condition. She wants to return to exercising, which was interrupted due to car issues, but she has since acquired a new vehicle.  No chest pain. Occasionally, she feels a 'twinge' near the pacemaker site, which she attributes to the device.  Her hemoglobin is 12.9, creatinine is 0.9, and LDL is 60. Her TSH is 2.1 and ALT is 14, indicating stable liver and thyroid  function.     Studies Reviewed: .        Results LABS Hb: 12.9 Cr: 0.9 LDL: 60 TSH: 2.1 ALT: 14  DIAGNOSTIC Echocardiogram: EF 60% EKG: Subtle atrial flutter Risk Assessment/Calculations:            Physical Exam:   VS:  BP 124/60 (BP Location: Left Arm, Patient Position: Sitting, Cuff Size: Large)   Pulse 79   Ht 5' 6 (1.676 m)   Wt 269 lb 6.4 oz (122.2 kg)   SpO2 95%   BMI 43.48 kg/m    Wt Readings from Last 3 Encounters:  05/22/24 269 lb  6.4 oz (122.2 kg)  05/02/24 272 lb 3.2 oz (123.5 kg)  03/23/24 285 lb 3.2 oz (129.4 kg)    GEN: Well nourished, well developed in no acute distress NECK: No JVD; No carotid bruits CARDIAC: RRR, no murmurs, no rubs, no gallops RESPIRATORY:  Clear to auscultation without rales, wheezing or rhonchi  ABDOMEN: Soft, non-tender, non-distended EXTREMITIES:  No edema; No deformity   ASSESSMENT AND PLAN: .    Assessment and Plan Assessment & Plan Persistent atrial flutter, rate controlled, with pacemaker Rate controlled atrial flutter with pacemaker in place. Heart rate is well controlled and close to normal rhythm. No chest pain reported. Pacemaker functioning well with occasional twinge likely due to pacemaker movement. - Continue amiodarone  at current dose - Continue carvedilol  12.5 mg in the morning and 6.25 mg in the evening - Continue Eliquis  5 mg twice daily - Amio labs are stable - Schedule one year follow-up  Chronic diastolic heart failure - Continue furosemide  as needed  Type 2 diabetes mellitus Type 2 diabetes is managed with Jardiance . Weight loss noted, beneficial for diabetes management. Dexcom used for continuous glucose monitoring, providing effective glucose control. - Continue Jardiance  - Continue using Dexcom for glucose monitoring  Hyperlipidemia Hyperlipidemia is well controlled with atorvastatin . LDL is at 60, which is optimal for cardiovascular health. -  Continue atorvastatin  40 mg daily         Dispo: 1 yr  Signed, Oneil Parchment, MD

## 2024-05-31 NOTE — Progress Notes (Signed)
 Remote pacemaker transmission.

## 2024-05-31 NOTE — Addendum Note (Signed)
 Addended by: VICCI SELLER A on: 05/31/2024 11:08 AM   Modules accepted: Orders

## 2024-06-22 DIAGNOSIS — M65321 Trigger finger, right index finger: Secondary | ICD-10-CM | POA: Diagnosis not present

## 2024-06-22 DIAGNOSIS — M65331 Trigger finger, right middle finger: Secondary | ICD-10-CM | POA: Diagnosis not present

## 2024-06-22 DIAGNOSIS — M65332 Trigger finger, left middle finger: Secondary | ICD-10-CM | POA: Diagnosis not present

## 2024-06-27 ENCOUNTER — Ambulatory Visit (INDEPENDENT_AMBULATORY_CARE_PROVIDER_SITE_OTHER): Payer: Medicare HMO

## 2024-06-27 DIAGNOSIS — I495 Sick sinus syndrome: Secondary | ICD-10-CM

## 2024-06-28 LAB — CUP PACEART REMOTE DEVICE CHECK
Battery Remaining Longevity: 109 mo
Battery Remaining Percentage: 92 %
Battery Voltage: 2.99 V
Brady Statistic AP VP Percent: 8.9 %
Brady Statistic AP VS Percent: 79 %
Brady Statistic AS VP Percent: 4 %
Brady Statistic AS VS Percent: 6.9 %
Brady Statistic RA Percent Paced: 13 %
Brady Statistic RV Percent Paced: 67 %
Date Time Interrogation Session: 20250909124336
Implantable Lead Connection Status: 753985
Implantable Lead Connection Status: 753985
Implantable Lead Implant Date: 20240906
Implantable Lead Implant Date: 20240906
Implantable Lead Location: 753859
Implantable Lead Location: 753860
Implantable Pulse Generator Implant Date: 20240906
Lead Channel Impedance Value: 380 Ohm
Lead Channel Impedance Value: 430 Ohm
Lead Channel Pacing Threshold Amplitude: 0.5 V
Lead Channel Pacing Threshold Amplitude: 0.75 V
Lead Channel Pacing Threshold Pulse Width: 0.5 ms
Lead Channel Pacing Threshold Pulse Width: 0.5 ms
Lead Channel Sensing Intrinsic Amplitude: 0.8 mV
Lead Channel Sensing Intrinsic Amplitude: 12 mV
Lead Channel Setting Pacing Amplitude: 1 V
Lead Channel Setting Pacing Amplitude: 2.5 V
Lead Channel Setting Pacing Pulse Width: 0.5 ms
Lead Channel Setting Sensing Sensitivity: 2 mV
Pulse Gen Model: 2272
Pulse Gen Serial Number: 8206505

## 2024-07-02 ENCOUNTER — Ambulatory Visit: Payer: Self-pay | Admitting: Cardiology

## 2024-07-03 ENCOUNTER — Other Ambulatory Visit (HOSPITAL_COMMUNITY): Payer: Self-pay

## 2024-07-03 ENCOUNTER — Ambulatory Visit (HOSPITAL_COMMUNITY)
Admission: RE | Admit: 2024-07-03 | Discharge: 2024-07-03 | Disposition: A | Discharge status: Home / Self Care | Source: Ambulatory Visit | Attending: Physician Assistant | Admitting: Physician Assistant

## 2024-07-03 VITALS — BP 128/64 | HR 60 | Ht 66.0 in | Wt 278.0 lb

## 2024-07-03 DIAGNOSIS — Z5181 Encounter for therapeutic drug level monitoring: Secondary | ICD-10-CM

## 2024-07-03 DIAGNOSIS — I4819 Other persistent atrial fibrillation: Secondary | ICD-10-CM | POA: Diagnosis not present

## 2024-07-03 DIAGNOSIS — I484 Atypical atrial flutter: Secondary | ICD-10-CM

## 2024-07-03 DIAGNOSIS — I4891 Unspecified atrial fibrillation: Secondary | ICD-10-CM | POA: Diagnosis not present

## 2024-07-03 DIAGNOSIS — Z79899 Other long term (current) drug therapy: Secondary | ICD-10-CM

## 2024-07-03 MED ORDER — FLUZONE HIGH-DOSE 0.5 ML IM SUSY
0.5000 mL | PREFILLED_SYRINGE | Freq: Once | INTRAMUSCULAR | 0 refills | Status: AC
  Filled 2024-07-03: qty 0.5, 1d supply, fill #0

## 2024-07-03 NOTE — Progress Notes (Signed)
 Primary Care Physician: Katina Pfeiffer, PA-C Primary Cardiologist: Oneil Parchment, MD Electrophysiologist: OLE ONEIDA HOLTS, MD  Referring Physician: ED     Linda Washington is a 73 y.o. female with a history of HTN, T2DM, OSA, chronic diastolic heart failure, HLD, obesity, and rate-controlled atrial flutter who presents for follow up in the [facility]. Seen in ED on 05/16/23 for weakness, bradycardia, and SOB; patient noting at home HR 30-40s normally in the 60s. Heart rates noted in the ED as low as 19-23; she did not require emergent procedure and was discharged home. Carvedilol  held per cardiology. 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 She was changed from warfarin to Eliquis  due to labile INRs. She was started on amiodarone  04/10/24.  Patient returns for follow up for atrial fibrillation and amiodarone  monitoring. She reports fatigue at times but overall is feeling well. She is in atypical atrial flutter vs atrial tach today. Her device interrogation on 9/9 showed A paced rhythm. No bleeding issues on anticoagulation.   Today, she  denies symptoms of palpitations, chest pain, shortness of breath, orthopnea, PND, lower extremity edema, dizziness, presyncope, syncope, bleeding, or neurologic sequela. The patient is tolerating medications without difficulties and is otherwise without complaint today.    Current Outpatient Medications  Medication Sig Dispense Refill   acetaminophen  (TYLENOL ) 650 MG CR tablet Take 650 mg by mouth every 8 (eight) hours as needed for pain. (Patient taking differently: Take 650 mg by mouth as needed for pain.)     amiodarone  (PACERONE ) 200 MG tablet Take 1 tablet (200 mg total) by mouth daily. 90 tablet 2   apixaban  (ELIQUIS ) 5 MG TABS tablet Take 1 tablet (5 mg total) by mouth 2 (two) times daily. 56 tablet    atorvastatin   (LIPITOR) 40 MG tablet Take 1 tablet (40 mg total) by mouth at bedtime. 90 tablet 3   carvedilol  (COREG ) 6.25 MG tablet Take 2 tablets in the AM and 1 tablet in the PM 270 tablet 3   Continuous Glucose Sensor (DEXCOM G7 SENSOR) MISC change sensor every 10 days; Duration: 90 days     cyanocobalamin  (VITAMIN B12) 1000 MCG tablet Take 1,000 mcg by mouth daily.     Dulaglutide (TRULICITY) 1.5 MG/0.5ML SOAJ Inject 1.5 mg into the skin once a week.     DULoxetine (CYMBALTA) 30 MG capsule Take 30 mg by mouth daily.     empagliflozin  (JARDIANCE ) 25 MG TABS tablet Take 25 mg by mouth daily.     fluticasone (FLONASE) 50 MCG/ACT nasal spray Place 1-2 sprays into both nostrils daily as needed for allergies or rhinitis.     furosemide  (LASIX ) 40 MG tablet TAKE 1 TABLET EVERY DAY (Patient taking differently: Take 40 mg by mouth as needed.) 90 tablet 2   insulin  glargine (LANTUS) 100 UNIT/ML injection Inject 10 Units into the skin daily.     metFORMIN  (GLUCOPHAGE ) 1000 MG tablet Take 1,000 mg by mouth 2 (two) times daily with a meal.     NOVOLOG  FLEXPEN 100 UNIT/ML FlexPen Inject 4 Units into the skin daily as needed (blood sugar above 200).     omeprazole (PRILOSEC) 40 MG capsule Take 40 mg by mouth daily.     oxybutynin  (DITROPAN  XL) 15 MG 24 hr tablet Take 15 mg by mouth daily.     Polyethyl Glycol-Propyl Glycol (SYSTANE OP) Place  1-2 drops into both eyes daily as needed (dry eyes).     Current Facility-Administered Medications  Medication Dose Route Frequency Provider Last Rate Last Admin   betamethasone  acetate-betamethasone  sodium phosphate (CELESTONE ) injection 3 mg  3 mg Intra-articular Once Evans, Brent M, DPM        Atrial Fibrillation Management history:  Previous antiarrhythmic drugs: amiodarone   Previous cardioversions: 06/22/2018 Previous ablations: None Anticoagulation history: warfarin, Eliquis    ROS- All systems are reviewed and negative except as per the HPI above.  Physical Exam: BP  128/64   Pulse 60   Ht 5' 6 (1.676 m)   Wt 126.1 kg   BMI 44.87 kg/m   GEN: Well nourished, well developed in no acute distress CARDIAC: Regular rate and rhythm, no murmurs, rubs, gallops RESPIRATORY:  Clear to auscultation without rales, wheezing or rhonchi  ABDOMEN: Soft, non-tender, non-distended EXTREMITIES:  No edema; No deformity    EKG today demonstrates  Atypical atrial flutter vs atrial tachycardia with variable block Vent. rate 60 BPM PR interval * ms QRS duration 100 ms QT/QTcB 442/442 ms   Echo 01/01/23 demonstrated   1. Left ventricular ejection fraction, by estimation, is 55 to 60%. The  left ventricle has normal function. The left ventricle has no regional  wall motion abnormalities. Left ventricular diastolic parameters were  normal.   2. Right ventricular systolic function is normal. The right ventricular  size is normal. There is mildly elevated pulmonary artery systolic  pressure.   3. Mild to moderate mitral valve regurgitation.   4. Tricuspid valve regurgitation is mild to moderate.   5. Aortic valve regurgitation is trivial.   6. The inferior vena cava is dilated in size with >50% respiratory  variability, suggesting right atrial pressure of 8 mmHg.    CHA2DS2-VASc Score = 6  The patient's score is based upon: CHF History: 1 HTN History: 1 Diabetes History: 1 Stroke History: 0 Vascular Disease History: 0 Age Score: 2 Gender Score: 1       ASSESSMENT AND PLAN: Persistent Atrial Fibrillation/atrial flutter (ICD10:  I48.19) The patient's CHA2DS2-VASc score is 6, indicating a 9.7% annual risk of stroke.   Patient in likely atrial flutter today, 85% burden on PPM interrogation but burden trending down recently. Will continue present treatment for now.  Continue amiodarone  200 mg daily Continue Eliquis  5 mg BID Continue carvedilol  12.5 mg AM and 6.25 mg PM  Secondary Hypercoagulable State (ICD10:  D68.69) The patient is at significant risk for  stroke/thromboembolism based upon her CHA2DS2-VASc Score of 6.  Continue Apixaban  (Eliquis ). No bleeding issues.   High Risk Medication Monitoring (ICD 10: J342684) Patient requires ongoing monitoring for anti-arrhythmic medication which has the potential to cause life threatening arrhythmias. Intervals on ECG acceptable for amiodarone  monitoring. Check cmet/TSH today.     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  Encouraged lifestyle modification   Follow up with Dr Cindie in 3 months.     Daril Kicks PA-C [facility] [address] [place], [place] [address] [phone]

## 2024-07-04 ENCOUNTER — Ambulatory Visit (HOSPITAL_COMMUNITY): Payer: Self-pay | Admitting: Physician Assistant

## 2024-07-04 LAB — COMPREHENSIVE METABOLIC PANEL WITH GFR
ALT: 26 IU/L (ref 0–32)
AST: 21 IU/L (ref 0–40)
Albumin: 4.1 g/dL (ref 3.8–4.8)
Alkaline Phosphatase: 72 IU/L (ref 49–135)
BUN/Creatinine Ratio: 18 (ref 12–28)
BUN: 17 mg/dL (ref 8–27)
Bilirubin Total: 0.5 mg/dL (ref 0.0–1.2)
CO2: 22 mmol/L (ref 20–29)
Calcium: 9.2 mg/dL (ref 8.7–10.3)
Chloride: 104 mmol/L (ref 96–106)
Creatinine, Ser: 0.94 mg/dL (ref 0.57–1.00)
Globulin, Total: 2.5 g/dL (ref 1.5–4.5)
Glucose: 101 mg/dL — ABNORMAL HIGH (ref 70–99)
Potassium: 4.5 mmol/L (ref 3.5–5.2)
Sodium: 142 mmol/L (ref 134–144)
Total Protein: 6.6 g/dL (ref 6.0–8.5)
eGFR: 63 mL/min/1.73 (ref >59)

## 2024-07-04 LAB — TSH: TSH: 1.55 u[IU]/mL (ref 0.450–4.500)

## 2024-07-07 NOTE — Progress Notes (Signed)
 Remote PPM Transmission

## 2024-07-12 ENCOUNTER — Telehealth: Payer: Self-pay | Admitting: *Deleted

## 2024-07-12 ENCOUNTER — Encounter: Payer: Self-pay | Admitting: Cardiology

## 2024-07-12 DIAGNOSIS — R130 Aphagia: Secondary | ICD-10-CM | POA: Diagnosis not present

## 2024-07-12 DIAGNOSIS — K429 Umbilical hernia without obstruction or gangrene: Secondary | ICD-10-CM | POA: Diagnosis not present

## 2024-07-12 DIAGNOSIS — Z7901 Long term (current) use of anticoagulants: Secondary | ICD-10-CM | POA: Diagnosis not present

## 2024-07-12 DIAGNOSIS — K219 Gastro-esophageal reflux disease without esophagitis: Secondary | ICD-10-CM | POA: Diagnosis not present

## 2024-07-12 DIAGNOSIS — Z860100 Personal history of colon polyps, unspecified: Secondary | ICD-10-CM | POA: Diagnosis not present

## 2024-07-12 NOTE — Telephone Encounter (Signed)
 Pharmacy please advise on holding Eliquis  prior to coolonoscopy scheduled for 08/30/2024. Last labs (CBC/BMET) 01/06/2024. Thank you.

## 2024-07-12 NOTE — Progress Notes (Signed)
 PERIOPERATIVE PRESCRIPTION FOR IMPLANTED CARDIAC DEVICE PROGRAMMING   Patient Information:  Patient: Linda Washington  MRN: [id number]  Date of Birth: 1951/03/06    Procedure:  COLONOSCOPY Date of Surgery:  Clearance 08/30/24                              Surgeon: DR. BURNETTE Surgeon's Group or Practice Name:  [facility] Phone number:  [phone] Fax number:  [phone]   Device Information:   Clinic EP Physician:   Dr. Ole Holts Device Type:  Pacemaker Manufacturer and Phone #:  St. Jude/Abbott: [phone] Pacemaker Dependent?:  Yes Date of Last Device Check:  06/27/2024         Normal Device Function?:  Yes     Electrophysiologist's Recommendations:   Have magnet available. Provide continuous ECG monitoring when magnet is used or reprogramming is to be performed.  Procedure may interfere with device function.  Magnet should be placed over device during procedure.  Per Device Clinic Standing Orders, Almarie ONEIDA Shutter  07/12/2024 4:19 PM

## 2024-07-12 NOTE — Telephone Encounter (Addendum)
   Pre-operative Risk Assessment    Patient Name: Linda Washington  DOB: 18-Dec-1950 MRN: [id number]   Date of last office visit: 05/22/24 DR. SKAINS Date of next office visit: NONE   Request for Surgical Clearance    Procedure:  COLONOSCOPY  Date of Surgery:  Clearance 08/30/24                                Surgeon: DR. BURNETTE Surgeon's Group or Practice Name:  [facility] Phone number:  [phone] Fax number:  [phone]   Type of Clearance Requested:   - Medical  - Pharmacy:  Hold Apixaban  (Eliquis ) x 2 DAYS PRIOR  NEED DEVICE CLEARANCE AS WELL; SENT TO DEVICE CLINIC FOR CLEARANCE Type of Anesthesia:  PROPOFOL    Additional requests/questions:    Bonney Niels Jest   07/12/2024, 3:30 PM

## 2024-07-25 NOTE — Telephone Encounter (Signed)
 Dr. Jeffrie,  You saw this patient on 05/22/2024. Per office protocol, will you please comment on medical clearance for colonoscopy on 11/12?  Please route your response to P CV DIV Preop. I will communicate with requesting office once you have given recommendations.   Thank you!  Barnie Hila, NP

## 2024-07-25 NOTE — Telephone Encounter (Signed)
 Patient with diagnosis of atrial flutter on Eliquis  for anticoagulation.    Procedure:  COLONOSCOPY   Date of Surgery:  Clearance 08/30/24   CHA2DS2-VASc Score = 6   This indicates a 9.7% annual risk of stroke. The patient's score is based upon: CHF History: 1 HTN History: 1 Diabetes History: 1 Stroke History: 0 Vascular Disease History: 0 Age Score: 2 Gender Score: 1    CrCl 70 ml/min Platelet count 347 K  Patient has not had an Afib/aflutter ablation within the last 3 months or DCCV within the last 30 days  Per office protocol, patient can hold Eliquis  for 2 days prior to procedure.    Patient will not need bridging with Lovenox (enoxaparin) around procedure.  **This guidance is not considered finalized until pre-operative APP has relayed final recommendations.**

## 2024-07-27 ENCOUNTER — Telehealth: Payer: Self-pay | Admitting: *Deleted

## 2024-07-27 ENCOUNTER — Other Ambulatory Visit: Payer: Self-pay | Admitting: General Surgery

## 2024-07-27 DIAGNOSIS — K429 Umbilical hernia without obstruction or gangrene: Secondary | ICD-10-CM

## 2024-07-27 DIAGNOSIS — E119 Type 2 diabetes mellitus without complications: Secondary | ICD-10-CM | POA: Diagnosis not present

## 2024-07-27 DIAGNOSIS — G4733 Obstructive sleep apnea (adult) (pediatric): Secondary | ICD-10-CM | POA: Diagnosis not present

## 2024-07-27 DIAGNOSIS — Z794 Long term (current) use of insulin: Secondary | ICD-10-CM | POA: Diagnosis not present

## 2024-07-27 DIAGNOSIS — I480 Paroxysmal atrial fibrillation: Secondary | ICD-10-CM | POA: Diagnosis not present

## 2024-07-27 DIAGNOSIS — M6208 Separation of muscle (nontraumatic), other site: Secondary | ICD-10-CM | POA: Diagnosis not present

## 2024-07-27 NOTE — Telephone Encounter (Signed)
   Pre-operative Risk Assessment    Patient Name: Linda Washington  DOB: 1951-05-20 MRN: [id number]   Date of last office visit: 05/22/2024 Date of next office visit: 08/22/2024   Request for Surgical Clearance    Procedure:  UMBILICAL HERNIA REPAIR  Date of Surgery:  Clearance TBD                                 Surgeon:  HERLENE STEVIE COME Surgeon's Group or Practice Name:  [facility] Phone number:  [phone] Fax number:  [phone]   Type of Clearance Requested:   - Medical  - Pharmacy:  Hold Apixaban  (Eliquis ) NOT INDICATED   Type of Anesthesia:  General    Additional requests/questions:    SignedApolinar Essex   07/27/2024, 2:11 PM

## 2024-07-28 ENCOUNTER — Encounter: Payer: Self-pay | Admitting: Podiatry

## 2024-07-28 ENCOUNTER — Ambulatory Visit: Admitting: Podiatry

## 2024-07-28 DIAGNOSIS — M79675 Pain in left toe(s): Secondary | ICD-10-CM

## 2024-07-28 DIAGNOSIS — B351 Tinea unguium: Secondary | ICD-10-CM | POA: Diagnosis not present

## 2024-07-28 DIAGNOSIS — E119 Type 2 diabetes mellitus without complications: Secondary | ICD-10-CM

## 2024-07-28 NOTE — Telephone Encounter (Signed)
   Patient Name: Linda Washington  DOB: 1951-09-02 MRN: [id number]  Primary Cardiologist: Oneil Parchment, MD  Chart reviewed as part of pre-operative protocol coverage. Given past medical history and time since last visit, based on ACC/AHA guidelines, [name] is at acceptable risk for the planned procedure without further cardiovascular testing.   Per office protocol, patient can hold Eliquis  for 2 days prior to procedure.   The patient was advised that if she develops new symptoms prior to surgery to contact our office to arrange for a follow-up visit, and she verbalized understanding.  I will route this recommendation to the requesting party via Epic fax function and remove from pre-op  pool.  Please call with questions.  Lamarr Satterfield, NP 07/28/2024, 3:43 PM

## 2024-07-28 NOTE — Progress Notes (Signed)
This patient returns to my office for at risk foot care.  This patient requires this care by a professional since this patient will be at risk due to having diabetes mellitus.and coagulation defect.   This patient is unable to cut nails herself since the patient cannot reach her nails.These nails are painful walking and wearing shoes.  This patient presents for at risk foot care today. ° °General Appearance  Alert, conversant and in no acute stress. ° °Vascular  Dorsalis pedis and posterior tibial  pulses are weakly  palpable  bilaterally.  Capillary return is within normal limits  bilaterally. Temperature is within normal limits  bilaterally. ° °Neurologic  Senn-Weinstein monofilament wire test diminished   bilaterally. Muscle power within normal limits bilaterally. ° °Nails Thick disfigured discolored nails with subungual debris  Hallux nails. No evidence of bacterial infection or drainage bilaterally. ° °Orthopedic  No limitations of motion  feet .  No crepitus or effusions noted.  HAV  B/L.  Hammer toes  B/L.  Haglunds deformity right foot.  Plantar flexed fifth metatarsal  B/L. ° °Skin  normotropic skin  noted bilaterally.  No signs of infections or ulcers noted.   Porokeratosis sub 5th met  B/L asymptomatic. ° °Onychomycosis  Pain in right toes  Pain in left toes   ° °Consent was obtained for treatment procedures.   Mechanical debridement of nails 1-5  bilaterally performed with a nail nipper.  Filed with dremel without incident. ° ° °Return office visit    3 months                  Told patient to return for periodic foot care and evaluation due to potential at risk complications. ° ° °Giamarie Bueche DPM  °

## 2024-08-03 ENCOUNTER — Other Ambulatory Visit: Payer: Self-pay | Admitting: Family Medicine

## 2024-08-03 DIAGNOSIS — Z1231 Encounter for screening mammogram for malignant neoplasm of breast: Secondary | ICD-10-CM

## 2024-08-06 NOTE — Telephone Encounter (Signed)
 Patient with diagnosis of atrial fibrillation on Eliquis  for anticoagulation.    Procedure:  UMBILICAL HERNIA REPAIR   Date of Surgery:  Clearance TBD      CHA2DS2-VASc Score = 6   This indicates a 9.7% annual risk of stroke. The patient's score is based upon: CHF History: 1 HTN History: 1 Diabetes History: 1 Stroke History: 0 Vascular Disease History: 0 Age Score: 2 Gender Score: 1    CrCl 100 Platelet count 347  Patient has not had an Afib/aflutter ablation in the last 3 months, DCCV within the last 4 weeks or a watchman implanted in the last 45 days   Per office protocol, patient can hold Eliquis  for 2 days prior to procedure.   Patient will not need bridging with Lovenox (enoxaparin) around procedure.  **This guidance is not considered finalized until pre-operative APP has relayed final recommendations.**

## 2024-08-07 NOTE — Telephone Encounter (Signed)
   Name: Linda Washington  DOB: 01/08/1951  MRN: [id number]  Primary Cardiologist: Oneil Parchment, MD  Chart reviewed as part of pre-operative protocol coverage. The patient has an upcoming visit scheduled with Daphne Barrack, NP on 08/22/24 at which time clearance can be addressed in case there are any issues that would impact surgical recommendations.  Umbilical hernia repair is not scheduled as below. I added preop FYI to appointment note so that provider is aware to address at time of outpatient visit.  Per office protocol the cardiology provider should forward their finalized clearance decision and recommendations regarding antiplatelet therapy to the requesting party below.    This message will also be routed to pharmacy pool for input on holding Eliquis  as requested below so that this information is available to the clearing provider at time of patient's appointment.   I will route this message as FYI to requesting party and remove this message from the preop box as separate preop APP input not needed at this time.   Please call with any questions.  Kysean Sweet D Natan Hartog, NP  08/07/2024, 10:00 AM

## 2024-08-07 NOTE — Telephone Encounter (Signed)
 Note was made in patient's chart of upcoming appointment and adding pre-op  clearance to appointment note.

## 2024-08-08 ENCOUNTER — Ambulatory Visit
Admission: RE | Admit: 2024-08-08 | Discharge: 2024-08-08 | Disposition: A | Discharge status: Home / Self Care | Source: Ambulatory Visit | Attending: General Surgery | Admitting: General Surgery

## 2024-08-08 DIAGNOSIS — K429 Umbilical hernia without obstruction or gangrene: Secondary | ICD-10-CM | POA: Diagnosis not present

## 2024-08-18 ENCOUNTER — Ambulatory Visit
Admission: RE | Admit: 2024-08-18 | Discharge: 2024-08-18 | Disposition: A | Discharge status: Home / Self Care | Source: Ambulatory Visit | Attending: Family Medicine | Admitting: Family Medicine

## 2024-08-18 DIAGNOSIS — Z1231 Encounter for screening mammogram for malignant neoplasm of breast: Secondary | ICD-10-CM

## 2024-08-21 NOTE — Progress Notes (Unsigned)
 Electrophysiology Office Note:   Date:  08/22/2024  ID:  [name], DOB 1951-09-13, MRN [id number]  Primary Cardiologist: Oneil Parchment, MD Primary Heart Failure: None Electrophysiologist: OLE ONEIDA HOLTS, MD       History of Present Illness:   Linda Washington is a 73 y.o. female with h/o persistent AFL, SSS s/p PPM, chronic dCHF, HTN, OSA, obesity seen today for routine electrophysiology followup & pre-procedure risk evaluation.   Since last being seen in our clinic the patient reports she is pending a colonoscopy and possible umbilical hernia repair. She was recently cleared for her colonoscopy by Dr. Parchment.  In today for clearance for hernia surgery. She reports she remains independent and is able to care for herself. No limitations in activity.  Denies shortness of breath or chest pain. Unfortunately, she reports she was recently taken advantage of by a scam and lost a lot of money.   She denies chest pain, palpitations, dyspnea, PND, orthopnea, nausea, vomiting, dizziness, syncope, edema, weight gain, or early satiety.   Review of systems complete and found to be negative unless listed in HPI.   EP Information / Studies Reviewed:    EKG is ordered today. Personal review as below.  EKG Interpretation Date/Time:  Tuesday August 22 2024 13:32:19 EST Ventricular Rate:  60 PR Interval:  248 QRS Duration:  94 QT Interval:  452 QTC Calculation: 452 R Axis:   -18  Text Interpretation: Atrial-paced rhythm with prolonged AV conduction Confirmed by Aniceto Jarvis ([id number]) on 08/22/2024 1:37:19 PM   PPM Interrogation-  reviewed in detail today,  See PACEART report.  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 Predominant underlying rhythm was SR. 5 VT runs > fastest interval lasting 16 beats with max rate of  203 bpm, longest 15.4 sec with ave rate of 148 bpm. 613 pauses with longest lasting 4.4 sec. Second degree type I AVB present, junctional rhythm present     Risk Assessment/Calculations:    CHA2DS2-VASc Score = 6   This indicates a 9.7% annual risk of stroke. The patient's score is based upon: CHF History: 1 HTN History: 1 Diabetes History: 1 Stroke History: 0 Vascular Disease History: 0 Age Score: 2 Gender Score: 1             Physical Exam:   VS:  BP 128/78   Pulse 60   Ht 5' 6 (1.676 m)   Wt 283 lb (128.4 kg)   BMI 45.68 kg/m    Wt Readings from Last 3 Encounters:  08/22/24 283 lb (128.4 kg)  07/03/24 278 lb (126.1 kg)  05/22/24 269 lb 6.4 oz (122.2 kg)     GEN: Well nourished, well developed in no acute distress NECK: No JVD; No carotid bruits CARDIAC: Regular rate and rhythm, no murmurs, rubs, gallops RESPIRATORY:  Clear to auscultation without rales, wheezing or rhonchi  ABDOMEN: Soft, non-tender, non-distended EXTREMITIES:  No edema; No deformity   ASSESSMENT AND PLAN:    SND s/p Abbott PPM  -Normal PPM function -See Pace Art report -No changes today  Persistent Atrial Fibrillation  Atypical Atrial Flutter  High Risk Medication Monitoring: Amiodarone   -previously evaluated for LAAO / ablation, felt BMI of 45 would complicate risk, medications were reviewed (QTc prohibitively long for Tikosyn), amiodarone  reviewed as AAD  of choice   -continue amiodarone  200 mg daily  -follow up amio labs in 6 months -EKG with APVS  -coreg  6.25mg  BID  -AF burden 85% by device / AMS episodes   Secondary Hypercoagulable State  -continue Eliquis  5mg  BID, dose reviewed and appropriate by age / wt -pt tolerating OAC, per Dr. Cindie, favor using medication for now over LAAO  OSA  -CPAP compliance encouraged     Pre-Procedure Risk Assessment  Procedure:  UMBILICAL HERNIA REPAIR Date of Surgery:  Clearance TBD                              Surgeon:  HERLENE STEVIE COME Surgeon's Group or Practice Name:  [facility] Phone number:  [phone] Fax number:  [phone] Type of Clearance Requested:   - Medical  - Pharmacy:  Hold Apixaban  (Eliquis ) NOT INDICATED Type of Anesthesia:  General       Ms. Naron's perioperative risk of a major cardiac event is 0.9% according to the Revised Cardiac Risk Index (RCRI).  Therefore, she is at low risk for perioperative complications.   Her functional capacity is good at 6.27 METs according to the Duke Activity Status Index (DASI).  Recommendations:  According to ACC/AHA guidelines, no further cardiovascular testing needed.  The patient may proceed to surgery at acceptable risk.     Antiplatelet and/or Anticoagulation Recommendations:  Eliquis  (Apixaban ) can be held for 2 days prior to surgery.  Please resume post op when felt to be safe.     Disposition:   Follow up with EP APP in 6 months  Signed, Daphne Barrack, NP-C, AGACNP-BC [facility] - Electrophysiology  08/22/2024, 1:57 PM

## 2024-08-22 ENCOUNTER — Ambulatory Visit: Attending: Pulmonary Disease | Admitting: Pulmonary Disease

## 2024-08-22 ENCOUNTER — Encounter: Payer: Self-pay | Admitting: Pulmonary Disease

## 2024-08-22 VITALS — BP 128/78 | HR 60 | Ht 66.0 in | Wt 283.0 lb

## 2024-08-22 DIAGNOSIS — I484 Atypical atrial flutter: Secondary | ICD-10-CM | POA: Diagnosis not present

## 2024-08-22 DIAGNOSIS — Z950 Presence of cardiac pacemaker: Secondary | ICD-10-CM | POA: Diagnosis not present

## 2024-08-22 DIAGNOSIS — I495 Sick sinus syndrome: Secondary | ICD-10-CM

## 2024-08-22 DIAGNOSIS — G4733 Obstructive sleep apnea (adult) (pediatric): Secondary | ICD-10-CM

## 2024-08-22 DIAGNOSIS — I4819 Other persistent atrial fibrillation: Secondary | ICD-10-CM

## 2024-08-22 DIAGNOSIS — D6869 Other thrombophilia: Secondary | ICD-10-CM

## 2024-08-22 LAB — CUP PACEART INCLINIC DEVICE CHECK
Date Time Interrogation Session: 20251104170615
Implantable Lead Connection Status: 753985
Implantable Lead Connection Status: 753985
Implantable Lead Implant Date: 20240906
Implantable Lead Implant Date: 20240906
Implantable Lead Location: 753859
Implantable Lead Location: 753860
Implantable Pulse Generator Implant Date: 20240906
Pulse Gen Model: 2272
Pulse Gen Serial Number: 8206505

## 2024-08-22 NOTE — Patient Instructions (Signed)
 Medication Instructions:  Your physician recommends that you continue on your current medications as directed. Please refer to the Current Medication list given to you today.  *If you need a refill on your cardiac medications before your next appointment, please call your pharmacy*  Lab Work: None ordered If you have labs (blood work) drawn today and your tests are completely normal, you will receive your results only by: MyChart Message (if you have MyChart) OR A paper copy in the mail If you have any lab test that is abnormal or we need to change your treatment, we will call you to review the results.  Follow-Up: At [facility], you and your health needs are our priority.  As part of our continuing mission to provide you with exceptional heart care, our providers are all part of one team.  This team includes your primary Cardiologist (physician) and Advanced Practice Providers or APPs (Physician Assistants and Nurse Practitioners) who all work together to provide you with the care you need, when you need it.  Your next appointment:   6 month(s)  Provider:   You will see one of the following Advanced Practice Providers on your designated Care Team:   Charlies Arthur, [place] Ozell Jodie Passey, PA-C Suzann Riddle, NP Daphne Barrack, NP Artist Pouch, PA-C

## 2024-08-23 NOTE — Progress Notes (Signed)
 Note faxed to requesting surgeons office.

## 2024-08-27 ENCOUNTER — Other Ambulatory Visit: Payer: Self-pay | Admitting: Cardiology

## 2024-08-28 ENCOUNTER — Ambulatory Visit: Payer: Self-pay | Admitting: Cardiology

## 2024-08-31 ENCOUNTER — Telehealth (HOSPITAL_BASED_OUTPATIENT_CLINIC_OR_DEPARTMENT_OTHER): Payer: Self-pay | Admitting: *Deleted

## 2024-08-31 NOTE — Telephone Encounter (Signed)
   Pre-operative Risk Assessment    Patient Name: Linda Washington  DOB: January 14, 1951 MRN: [id number]   Date of last office visit: 08/22/24 DAPHNE BARRACK, NP Date of next office visit: NONE   Request for Surgical Clearance    Procedure:  LAP CHOLE AND UMBILICAL HERNIA REPAIR  Date of Surgery:  Clearance TBD                                Surgeon:  DR. HERLENE BUREAU Surgeon's Group or Practice Name:  [facility] Phone number:  [phone] Fax number:  [phone] KELSEY PHILLIPS, CMA   Type of Clearance Requested:   - Medical  - Pharmacy:  Hold Apixaban  (Eliquis )     Type of Anesthesia:  General    Additional requests/questions:    Bonney Niels Jest   08/31/2024, 3:06 PM

## 2024-09-01 NOTE — Telephone Encounter (Addendum)
   Patient Name: Linda Washington  DOB: [birth year] MRN: [id number]  Primary Cardiologist: Oneil Parchment, MD  Chart reviewed as part of pre-operative protocol coverage. Patient was recently seen by Daphne Barrack, NP, on 08/22/2024 at which time she mentioned need for hernia repair. Per Ms. Ollis's note:  Ms. Cosens's perioperative risk of a major cardiac event is 0.9% according to the Revised Cardiac Risk Index (RCRI).  Therefore, she is at low risk for perioperative complications.   Her functional capacity is good at 6.27 METs according to the Duke Activity Status Index (DASI). According to ACC/AHA guidelines, no further cardiovascular testing needed. The patient may proceed to surgery at acceptable risk. Eliquis  (Apixaban ) can be held for 2 days prior to surgery.  Please resume post op when felt to be safe.    I will route this recommendation to the requesting party via Epic fax function.  Please call with questions.  Heli Dino E Lanson Randle, PA-C 09/01/2024, 8:39 AM

## 2024-09-04 ENCOUNTER — Ambulatory Visit: Payer: Self-pay | Admitting: General Surgery

## 2024-09-22 ENCOUNTER — Encounter (HOSPITAL_BASED_OUTPATIENT_CLINIC_OR_DEPARTMENT_OTHER)

## 2024-09-22 ENCOUNTER — Ambulatory Visit: Admitting: Pulmonary Disease

## 2024-09-26 ENCOUNTER — Ambulatory Visit: Payer: Medicare HMO

## 2024-09-27 ENCOUNTER — Ambulatory Visit

## 2024-09-27 DIAGNOSIS — R0602 Shortness of breath: Secondary | ICD-10-CM

## 2024-09-27 LAB — CUP PACEART REMOTE DEVICE CHECK
Battery Remaining Longevity: 107 mo
Battery Remaining Percentage: 90 %
Battery Voltage: 2.99 V
Brady Statistic AP VP Percent: 11 %
Brady Statistic AP VS Percent: 69 %
Brady Statistic AS VP Percent: 15 %
Brady Statistic AS VS Percent: 4.5 %
Brady Statistic RA Percent Paced: 12 %
Brady Statistic RV Percent Paced: 64 %
Date Time Interrogation Session: 20251209020018
Implantable Lead Connection Status: 753985
Implantable Lead Connection Status: 753985
Implantable Lead Implant Date: 20240906
Implantable Lead Implant Date: 20240906
Implantable Lead Location: 753859
Implantable Lead Location: 753860
Implantable Pulse Generator Implant Date: 20240906
Lead Channel Impedance Value: 400 Ohm
Lead Channel Impedance Value: 440 Ohm
Lead Channel Pacing Threshold Amplitude: 0.5 V
Lead Channel Pacing Threshold Amplitude: 0.75 V
Lead Channel Pacing Threshold Pulse Width: 0.5 ms
Lead Channel Pacing Threshold Pulse Width: 0.5 ms
Lead Channel Sensing Intrinsic Amplitude: 1.2 mV
Lead Channel Sensing Intrinsic Amplitude: 12 mV
Lead Channel Setting Pacing Amplitude: 1 V
Lead Channel Setting Pacing Amplitude: 2.5 V
Lead Channel Setting Pacing Pulse Width: 0.5 ms
Lead Channel Setting Sensing Sensitivity: 2 mV
Pulse Gen Model: 2272
Pulse Gen Serial Number: 8206505

## 2024-09-27 LAB — PULMONARY FUNCTION TEST
DL/VA % pred: 156 %
DL/VA: 6.38 ml/min/mmHg/L
DLCO unc % pred: 119 %
DLCO unc: 24.15 ml/min/mmHg
FEF 25-75 Post: 3.39 L/s
FEF 25-75 Pre: 1.97 L/s
FEF2575-%Change-Post: 72 %
FEF2575-%Pred-Post: 191 %
FEF2575-%Pred-Pre: 110 %
FEV1-%Change-Post: 11 %
FEV1-%Pred-Post: 87 %
FEV1-%Pred-Pre: 78 %
FEV1-Post: 2.02 L
FEV1-Pre: 1.81 L
FEV1FVC-%Change-Post: 5 %
FEV1FVC-%Pred-Pre: 108 %
FEV6-%Change-Post: 5 %
FEV6-%Pred-Post: 80 %
FEV6-%Pred-Pre: 76 %
FEV6-Post: 2.36 L
FEV6-Pre: 2.23 L
FEV6FVC-%Pred-Post: 104 %
FEV6FVC-%Pred-Pre: 104 %
FVC-%Change-Post: 5 %
FVC-%Pred-Post: 76 %
FVC-%Pred-Pre: 72 %
FVC-Post: 2.36 L
FVC-Pre: 2.23 L
Post FEV1/FVC ratio: 86 %
Post FEV6/FVC ratio: 100 %
Pre FEV1/FVC ratio: 81 %
Pre FEV6/FVC Ratio: 100 %
RV % pred: 107 %
RV: 2.57 L
TLC % pred: 89 %
TLC: 4.8 L

## 2024-09-27 NOTE — Progress Notes (Signed)
 Full PFT completed today ? ?

## 2024-09-27 NOTE — Patient Instructions (Signed)
 Full PFT completed today ? ?

## 2024-09-28 ENCOUNTER — Ambulatory Visit: Payer: Self-pay | Admitting: Cardiology

## 2024-09-28 ENCOUNTER — Encounter: Payer: Self-pay | Admitting: Cardiology

## 2024-09-28 NOTE — Progress Notes (Signed)
 PERIOPERATIVE PRESCRIPTION FOR IMPLANTED CARDIAC DEVICE PROGRAMMING  Patient Information: Name:  Yeilyn Gent  DOB:  November 29, 1950  MRN:  [id number]    Planned Procedure:  laparoscopic cholecystectomy, umbilical hernia repair  Surgeon:  Dr. LITTIE Bureau  Date of Procedure:  10-02-24 @ 13:30  Cautery will be used.  Position during surgery:  Supine   Please send documentation back to:  [facility] (Fax # [phone])  Device Information:  Clinic EP Physician:  Soyla Norton, MD   Device Type:  Pacemaker Manufacturer and Phone #:  St. Jude/Abbott: [phone] Pacemaker Dependent?:  No. Date of Last Device Check:  09/26/2024  Normal Device Function?:  Yes.    Electrophysiologist's Recommendations:  Have magnet available. Provide continuous ECG monitoring when magnet is used or reprogramming is to be performed.  Procedure may interfere with device function.  Magnet should be placed over device during procedure.  Per Device Clinic Standing Orders, Almarie ONEIDA Shutter, RN  4:16 PM 09/28/2024

## 2024-09-28 NOTE — Progress Notes (Signed)
 Surgical Instructions   Your procedure is scheduled on Monday, December 15th. Report to [facility] Main Entrance A at 11:30 A.M., then check in with the Admitting office. Any questions or running late day of surgery: call [phone]  Questions prior to your surgery date: call [phone], Monday-Friday, 8am-4pm. If you experience any cold or flu symptoms such as cough, fever, chills, shortness of breath, etc. between now and your scheduled surgery, please notify us  at the above number.     Remember:  Do not eat after midnight the night before your surgery  You may drink clear liquids until 10:30 the morning of your surgery.   Clear liquids allowed are: Water, Non-Citrus Juices (without pulp), Carbonated Beverages, Clear Tea (no milk, honey, etc.), Black Coffee Only (NO MILK, CREAM OR POWDERED CREAMER of any kind), and Gatorade.    Take these medicines the morning of surgery with A SIP OF WATER  amiodarone  (PACERONE )  carvedilol  (COREG )  DULoxetine (CYMBALTA)  omeprazole (PRILOSEC)   May take these medicines IF NEEDED: acetaminophen  (TYLENOL )  fluticasone (FLONASE)  oxybutynin  (DITROPAN  XL)   Per your physician's instruction, HOLD your apixaban  (ELIQUIS ) for 2 days prior to surgery.  Last dose on Friday, Dec 12th.   One week prior to surgery, STOP taking any Aspirin  (unless otherwise instructed by your surgeon) Aleve, Naproxen, Ibuprofen, Motrin, Advil, Goody's, BC's, all herbal medications, fish oil, and non-prescription vitamins.          WHAT DO I DO ABOUT MY DIABETES MEDICATION?   Do not take oral diabetes medicines metFORMIN  (GLUCOPHAGE ) the morning of surgery.  As of today, HOLD your Dulaglutide (TRULICITY) and your empagliflozin  (JARDIANCE ) .  The night before or the day of surgery only take 5 units (50%) of your insulin  glargine (LANTUS).  If your CBG is greater than 220 mg/dL, you may take 2 units (50%) of your NOVOLOG  insulin  injection .   HOW TO MANAGE YOUR  DIABETES BEFORE AND AFTER SURGERY  Why is it important to control my blood sugar before and after surgery? Improving blood sugar levels before and after surgery helps healing and can limit problems. A way of improving blood sugar control is eating a healthy diet by:  Eating less sugar and carbohydrates  Increasing activity/exercise  Talking with your doctor about reaching your blood sugar goals High blood sugars (greater than 180 mg/dL) can raise your risk of infections and slow your recovery, so you will need to focus on controlling your diabetes during the weeks before surgery. Make sure that the doctor who takes care of your diabetes knows about your planned surgery including the date and location.  How do I manage my blood sugar before surgery? Check your blood sugar at least 4 times a day, starting 2 days before surgery, to make sure that the level is not too high or low.  Check your blood sugar the morning of your surgery when you wake up and every 2 hours until you get to the Short Stay unit.  If your blood sugar is less than 70 mg/dL, you will need to treat for low blood sugar: Do not take insulin . Treat a low blood sugar (less than 70 mg/dL) with  cup of clear juice (cranberry or apple), 4 glucose tablets, OR glucose gel. Recheck blood sugar in 15 minutes after treatment (to make sure it is greater than 70 mg/dL). If your blood sugar is not greater than 70 mg/dL on recheck, call [phone] for further instructions. Report your blood sugar to  the short stay nurse when you get to Short Stay.  If you are admitted to the hospital after surgery: Your blood sugar will be checked by the staff and you will probably be given insulin  after surgery (instead of oral diabetes medicines) to make sure you have good blood sugar levels. The goal for blood sugar control after surgery is 80-180 mg/dL.             Do NOT Smoke (Tobacco/Vaping) for 24 hours prior to your procedure.  If you use a  CPAP at night, you may bring your mask/headgear for your overnight stay.   You will be asked to remove any contacts, glasses, piercing's, hearing aid's, dentures/partials prior to surgery. Please bring cases for these items if needed.    Patients discharged the day of surgery will not be allowed to drive home, and someone needs to stay with them for 24 hours.  SURGICAL WAITING ROOM VISITATION Patients may have no more than 2 support people in the waiting area - these visitors may rotate.   Pre-op  nurse will coordinate an appropriate time for 1 ADULT support person, who may not rotate, to accompany patient in pre-op .  Children under the age of 57 must have an adult with them who is not the patient and must remain in the main waiting area with an adult.  If the patient needs to stay at the hospital during part of their recovery, the visitor guidelines for inpatient rooms apply.  Please refer to the [facility] website for the visitor guidelines for any additional information.   If you received a COVID test during your pre-op  visit  it is requested that you wear a mask when out in public, stay away from anyone that may not be feeling well and notify your surgeon if you develop symptoms. If you have been in contact with anyone that has tested positive in the last 10 days please notify you surgeon.      Pre-operative 4 CHG Bathing Instructions   You can play a key role in reducing the risk of infection after surgery. Your skin needs to be as free of germs as possible. You can reduce the number of germs on your skin by washing with CHG (chlorhexidine  gluconate) soap before surgery. CHG is an antiseptic soap that kills germs and continues to kill germs even after washing.   DO NOT use if you have an allergy to chlorhexidine /CHG or antibacterial soaps. If your skin becomes reddened or irritated, stop using the CHG and notify one of our RNs at [phone].   Please shower with the CHG soap  starting 4 days before surgery using the following schedule:     Please keep in mind the following:  DO NOT shave, including legs and underarms, starting the day of your first shower.   You may shave your face at any point before/day of surgery.  Place clean sheets on your bed the day you start using CHG soap. Use a clean washcloth (not used since being washed) for each shower. DO NOT sleep with pets once you start using the CHG.   CHG Shower Instructions:  Wash your face and private area with normal soap. If you choose to wash your hair, wash first with your normal shampoo.  After you use shampoo/soap, rinse your hair and body thoroughly to remove shampoo/soap residue.  Turn the water OFF and apply  bottle of CHG soap to a CLEAN washcloth.  Apply CHG soap ONLY FROM YOUR NECK DOWN  TO YOUR TOES (washing for 3-5 minutes)  DO NOT use CHG soap on face, private areas, open wounds, or sores.  Pay special attention to the area where your surgery is being performed.  If you are having back surgery, having someone wash your back for you may be helpful. Wait 2 minutes after CHG soap is applied, then you may rinse off the CHG soap.  Pat dry with a clean towel  Put on clean clothes/pajamas   If you choose to wear lotion, please use ONLY the CHG-compatible lotions that are listed below.  Additional instructions for the day of surgery:  If you choose, you may shower the morning of surgery with an antibacterial soap.  DO NOT APPLY any lotions, deodorants, cologne, or perfumes.   Do not bring valuables to the hospital. [facility] is not responsible for any belongings/valuables. Do not wear nail polish, gel polish, artificial nails, or any other type of covering on natural nails (fingers and toes) Do not wear jewelry or makeup Put on clean/comfortable clothes.  Please brush your teeth.  Ask your nurse before applying any prescription medications to the skin.     CHG Compatible Lotions    Aveeno Moisturizing lotion  Cetaphil Moisturizing Cream  Cetaphil Moisturizing Lotion  Clairol Herbal Essence Moisturizing Lotion, Dry Skin  Clairol Herbal Essence Moisturizing Lotion, Extra Dry Skin  Clairol Herbal Essence Moisturizing Lotion, Normal Skin  Curel Age Defying Therapeutic Moisturizing Lotion with Alpha Hydroxy  Curel Extreme Care Body Lotion  Curel Soothing Hands Moisturizing Hand Lotion  Curel Therapeutic Moisturizing Cream, Fragrance-Free  Curel Therapeutic Moisturizing Lotion, Fragrance-Free  Curel Therapeutic Moisturizing Lotion, Original Formula  Eucerin Daily Replenishing Lotion  Eucerin Dry Skin Therapy Plus Alpha Hydroxy Crme  Eucerin Dry Skin Therapy Plus Alpha Hydroxy Lotion  Eucerin Original Crme  Eucerin Original Lotion  Eucerin Plus Crme Eucerin Plus Lotion  Eucerin TriLipid Replenishing Lotion  Keri Anti-Bacterial Hand Lotion  Keri Deep Conditioning Original Lotion Dry Skin Formula Softly Scented  Keri Deep Conditioning Original Lotion, Fragrance Free Sensitive Skin Formula  Keri Lotion Fast Absorbing Fragrance Free Sensitive Skin Formula  Keri Lotion Fast Absorbing Softly Scented Dry Skin Formula  Keri Original Lotion  Keri Skin Renewal Lotion Keri Silky Smooth Lotion  Keri Silky Smooth Sensitive Skin Lotion  Nivea Body Creamy Conditioning Oil  Nivea Body Extra Enriched Lotion  Nivea Body Original Lotion  Nivea Body Sheer Moisturizing Lotion Nivea Crme  Nivea Skin Firming Lotion  NutraDerm 30 Skin Lotion  NutraDerm Skin Lotion  NutraDerm Therapeutic Skin Cream  NutraDerm Therapeutic Skin Lotion  ProShield Protective Hand Cream  Provon moisturizing lotion  Please read over the following fact sheets that you were given.

## 2024-09-28 NOTE — Progress Notes (Signed)
 Device Orders requested

## 2024-09-29 ENCOUNTER — Other Ambulatory Visit: Payer: Self-pay

## 2024-09-29 ENCOUNTER — Inpatient Hospital Stay (HOSPITAL_COMMUNITY): Admission: RE | Admit: 2024-09-29 | Discharge: 2024-09-29 | Attending: General Surgery

## 2024-09-29 ENCOUNTER — Encounter (HOSPITAL_COMMUNITY): Payer: Self-pay

## 2024-09-29 VITALS — BP 146/80 | HR 70 | Temp 97.9°F | Resp 20 | Ht 66.0 in | Wt 278.9 lb

## 2024-09-29 DIAGNOSIS — Z01818 Encounter for other preprocedural examination: Secondary | ICD-10-CM

## 2024-09-29 HISTORY — DX: Presence of cardiac pacemaker: Z95.0

## 2024-09-29 LAB — CBC
HCT: 40.6 % (ref 36.0–46.0)
Hemoglobin: 13.3 g/dL (ref 12.0–15.0)
MCH: 31 pg (ref 26.0–34.0)
MCHC: 32.8 g/dL (ref 30.0–36.0)
MCV: 94.6 fL (ref 80.0–100.0)
Platelets: 283 K/uL (ref 150–400)
RBC: 4.29 MIL/uL (ref 3.87–5.11)
RDW: 14.2 % (ref 11.5–15.5)
WBC: 7.3 K/uL (ref 4.0–10.5)
nRBC: 0 % (ref 0.0–0.2)

## 2024-09-29 LAB — HEMOGLOBIN A1C
Hgb A1c MFr Bld: 6.8 % — ABNORMAL HIGH (ref 4.8–5.6)
Mean Plasma Glucose: 148.46 mg/dL

## 2024-09-29 LAB — BASIC METABOLIC PANEL WITH GFR
Anion gap: 10 (ref 5–15)
BUN: 12 mg/dL (ref 8–23)
CO2: 25 mmol/L (ref 22–32)
Calcium: 9 mg/dL (ref 8.9–10.3)
Chloride: 105 mmol/L (ref 98–111)
Creatinine, Ser: 0.94 mg/dL (ref 0.44–1.00)
GFR, Estimated: >60 mL/min (ref >60)
Glucose, Bld: 165 mg/dL — ABNORMAL HIGH (ref 70–99)
Potassium: 3.9 mmol/L (ref 3.5–5.1)
Sodium: 140 mmol/L (ref 135–145)

## 2024-09-29 LAB — TYPE AND SCREEN
ABO/RH(D): A POS
Antibody Screen: NEGATIVE

## 2024-09-29 LAB — GLUCOSE, CAPILLARY: Glucose-Capillary: 142 mg/dL — ABNORMAL HIGH (ref 70–99)

## 2024-09-29 NOTE — Progress Notes (Signed)
 Anesthesia Chart Review:  Case: [id number] Date/Time: 10/02/24 1315   Procedures:      LAPAROSCOPIC CHOLECYSTECTOMY     REPAIR, HERNIA, UMBILICAL, ADULT   Anesthesia type: General   Pre-op  diagnosis: GALLSTONES UMBILICAL HERNIA   [facility]: [facility] OR ROOM 20 / [facility] OR   Surgeons: Kinsinger, Herlene Righter, MD       DISCUSSION: Patient is a 73 year old female scheduled for the above procedure.   History includes never smoker, post-operative N//V, HTN, HLD, DM2, aflutter (diagnosed is 2016; s/p DCCV 06/22/2018), SSS (s/p St. Jude Assurity MRI PPM 06/25/2023), chronic diastolic CHF, GERD, hiatal hernia, OSA (intolerant to CPAP), skin cancer (BCC excision), osteoarthritis (left TKA 04/08/2015). Normal coronaries in 2018. BMI is consistent with morbid obesity.   EP follow-up and preoperative evaluation on 08/22/2024 by Aniceto Jarvis, NP. Ms. Mariotti's perioperative risk of a major cardiac event is 0.9% according to the Revised Cardiac Risk Index (RCRI).  Therefore, she is at low risk for perioperative complications.   Her functional capacity is good at 6.27 METs according to the Duke Activity Status Index (DASI).   Recommendations:  According to ACC/AHA guidelines, no further cardiovascular testing needed.  The patient may proceed to surgery at acceptable risk.       Antiplatelet and/or Anticoagulation Recommendations:   Eliquis  (Apixaban ) can be held for 2 days prior to surgery.  Please resume post op when felt to be safe.   EP PPM Perioperative Recommendations: Device Information: Clinic EP Physician:  Soyla Norton, MD  Device Type:  Pacemaker Manufacturer and Phone #:  St. Jude/Abbott: [phone] Pacemaker Dependent?:  No. Date of Last Device Check:  09/26/2024      Normal Device Function?:  Yes.     Electrophysiologist's Recommendations: Have magnet available. Provide continuous ECG monitoring when magnet is used or reprogramming is to be performed.  Procedure may interfere with device  function.  Magnet should be placed over device during procedure.  A1c 6.8%. Meds include Trulicity, Jardiance , Lantus. She reported last Trulicity 09/27/2024. To hold Trulicity and Jardiance  until after 10/02/2024 surgery.   Anesthesia team to evaluate on the day of surgery.    VS: BP (!) 146/80   Pulse 70   Temp 36.6 C (Oral)   Resp 20   Ht 5' 6 (1.676 m)   Wt 126.5 kg   SpO2 (!) 70%   BMI 45.02 kg/m    PROVIDERS: Katina Pfeiffer, PA-C is PCP  Jeffrie Anes, MD is cardiologist Cindie Smalls, MD is EP   LABS: Labs reviewed: Acceptable for surgery. LFTs normal on 07/03/2024. (all labs ordered are listed, but only abnormal results are displayed)  Labs Reviewed  GLUCOSE, CAPILLARY - Abnormal; Notable for the following components:      Result Value   Glucose-Capillary 142 (*)    All other components within normal limits  BASIC METABOLIC PANEL WITH GFR - Abnormal; Notable for the following components:   Glucose, Bld 165 (*)    All other components within normal limits  HEMOGLOBIN A1C - Abnormal; Notable for the following components:   Hgb A1c MFr Bld 6.8 (*)    All other components within normal limits  CBC  TYPE AND SCREEN    PFTs 09/27/2024: FVC 2.23 (72%), post 2.36 (76%). FEV1 1.81 (78%), post 2.02 (87%). DLCO unc 24.15 (119%).   IMAGES: CT Abd/pelvis 08/08/2024: IMPRESSION: 1. No CT evidence for acute intra-abdominal or pelvic abnormality. 2. Small fat containing umbilical hernia without signs for incarceration. Negative for bowel containing  ventral hernia. 3. Gallstones.   EKG: 08/22/2024: Atrial-paced rhythm with prolonged AV conduction Confirmed by Aniceto Jarvis ([id number]) on 08/22/2024 1:37:19 P<   CV: Echo 01/01/2023: IMPRESSIONS   1. Left ventricular ejection fraction, by estimation, is 55 to 60%. The  left ventricle has normal function. The left ventricle has no regional  wall motion abnormalities. Left ventricular diastolic parameters were   normal.   2. Right ventricular systolic function is normal. The right ventricular  size is normal. There is mildly elevated pulmonary artery systolic  pressure.   3. Mild to moderate mitral valve regurgitation.   4. Tricuspid valve regurgitation is mild to moderate.   5. Aortic valve regurgitation is trivial.   6. The inferior vena cava is dilated in size with >50% respiratory  variability, suggesting right atrial pressure of 8 mmHg.    Cardiac cath 02/01/2017: The left ventricular systolic function is normal. LV end diastolic pressure is normal. The left ventricular ejection fraction is 55-65% by visual estimate.   IMPRESSION: Ms. Ewalt has normal coronary arteries and normal LV function. I believe her chest pain is noncardiac.    Past Medical History:  Diagnosis Date   Acute on chronic diastolic (congestive) heart failure (HCC) 06/2018   Anxiety    takes Citalopram  daily   Arthritis    generalized   Basal cell carcinoma    GERD (gastroesophageal reflux disease)    takes Omeprazole daily   History of Bell's palsy    left side   History of bronchitis    History of hiatal hernia    History of kidney stones    Hyperlipemia    takes Simvastatin  daily   Hypertension    takes Enalapril  daily   Hypothyroidism    Incontinence    takes Ditropan  daily   Joint swelling    left knee   Neuropathy    takes Gabapentin  daily   PONV (postoperative nausea and vomiting)    Presence of permanent cardiac pacemaker    RLS (restless legs syndrome)    Seasonal allergies    takes Claritin  daioy as needed   Sleep apnea    pt. states that she does not have a CPAP   Tremor    Type 2 diabetes mellitus (HCC)    takes Metformin  and Amaryl  daily    Past Surgical History:  Procedure Laterality Date   ACHILLES TENDON REPAIR Right    APPENDECTOMY     BASAL CELL CARCINOMA EXCISION     Nose   BREAST LUMPECTOMY Left    CARDIAC CATHETERIZATION     pt. states approximately 15 years ago    CARDIOVERSION N/A 06/22/2018   Procedure: CARDIOVERSION;  Surgeon: Lonni Slain, MD;  Location: [facility] ENDOSCOPY;  Service: Cardiovascular;  Laterality: N/A;   CARPAL TUNNEL RELEASE Bilateral    DILATION AND CURETTAGE OF UTERUS     EYE SURGERY Bilateral    cataract surgery   KNEE ARTHROSCOPY     Right   LEFT HEART CATH AND CORONARY ANGIOGRAPHY N/A 02/01/2017   Procedure: Left Heart Cath and Coronary Angiography;  Surgeon: Dorn JINNY Lesches, MD;  Location: [facility] INVASIVE CV LAB;  Service: Cardiovascular;  Laterality: N/A;   LITHOTRIPSY  2013   PACEMAKER IMPLANT N/A 06/25/2023   Procedure: PACEMAKER IMPLANT;  Surgeon: Inocencio Soyla Lunger, MD;  Location: [facility] INVASIVE CV LAB;  Service: Cardiovascular;  Laterality: N/A;   TEE WITHOUT CARDIOVERSION N/A 06/22/2018   Procedure: TRANSESOPHAGEAL ECHOCARDIOGRAM (TEE);  Surgeon: Lonni Slain, MD;  Location: [facility]  ENDOSCOPY;  Service: Cardiovascular;  Laterality: N/A;   TOTAL ABDOMINAL HYSTERECTOMY     TOTAL KNEE ARTHROPLASTY Left 04/08/2015   Procedure: LEFT TOTAL KNEE ARTHROPLASTY;  Surgeon: Dempsey Sensor, MD;  Location: [facility] OR;  Service: Orthopedics;  Laterality: Left;    MEDICATIONS:  apixaban  (ELIQUIS ) 5 MG TABS tablet   acetaminophen  (TYLENOL ) 650 MG CR tablet   amiodarone  (PACERONE ) 200 MG tablet   atorvastatin  (LIPITOR) 40 MG tablet   carvedilol  (COREG ) 6.25 MG tablet   Continuous Glucose Sensor (DEXCOM G7 SENSOR) MISC   cyanocobalamin  (VITAMIN B12) 1000 MCG tablet   Dulaglutide (TRULICITY) 1.5 MG/0.5ML SOAJ   DULoxetine (CYMBALTA) 30 MG capsule   empagliflozin  (JARDIANCE ) 25 MG TABS tablet   fluticasone (FLONASE) 50 MCG/ACT nasal spray   furosemide  (LASIX ) 40 MG tablet   insulin  glargine (LANTUS) 100 UNIT/ML injection   metFORMIN  (GLUCOPHAGE ) 1000 MG tablet   NOVOLOG  FLEXPEN 100 UNIT/ML FlexPen   omeprazole (PRILOSEC) 40 MG capsule   oxybutynin  (DITROPAN  XL) 15 MG 24 hr tablet   Polyethyl Glycol-Propyl Glycol (SYSTANE OP)     betamethasone  acetate-betamethasone  sodium phosphate (CELESTONE ) injection 3 mg   Wataru Mccowen, PA-C Surgical Short Stay/Anesthesiology [facility] Phone [phone] [facility] Phone [phone] 09/29/2024 6:17 PM

## 2024-09-29 NOTE — Anesthesia Preprocedure Evaluation (Signed)
 Anesthesia Evaluation  Patient identified by MRN, date of birth, ID band Patient awake    Reviewed: Allergy & Precautions, H&P , NPO status , Patient's Chart, lab work & pertinent test results, reviewed documented beta blocker date and time   History of Anesthesia Complications (+) PONV and history of anesthetic complications  Airway Mallampati: II   Neck ROM: full    Dental   Pulmonary sleep apnea    breath sounds clear to auscultation       Cardiovascular hypertension, Pt. on medications and Pt. on home beta blockers +CHF  + pacemaker  Rhythm:regular Rate:Normal     Neuro/Psych   Anxiety Depression    RLS    GI/Hepatic Neg liver ROS, hiatal hernia,GERD  Medicated,,  Endo/Other  diabetes, Type 2, Oral Hypoglycemic AgentsHypothyroidism  Class 3 obesity  Renal/GU negative Renal ROS     Musculoskeletal  (+) Arthritis ,    Abdominal   Peds  Hematology negative hematology ROS (+)   Anesthesia Other Findings   Reproductive/Obstetrics                              Anesthesia Physical Anesthesia Plan  ASA: 2  Anesthesia Plan: General   Post-op Pain Management: Tylenol  PO (pre-op )*   Induction: Intravenous  PONV Risk Score and Plan: 4 or greater and Treatment may vary due to age or medical condition, Ondansetron , Dexamethasone , Midazolam  and Propofol  infusion  Airway Management Planned: Oral ETT  Additional Equipment: None  Intra-op Plan:   Post-operative Plan: Extubation in OR  Informed Consent: I have reviewed the patients History and Physical, chart, labs and discussed the procedure including the risks, benefits and alternatives for the proposed anesthesia with the patient or authorized representative who has indicated his/her understanding and acceptance.     Dental advisory given  Plan Discussed with: CRNA, Anesthesiologist and Surgeon  Anesthesia Plan Comments: (PAT note  written 09/29/2024 by Isaiah Ruder, PA-C.    EP PPM Perioperative Recommendations: Device Information: Clinic EP Physician:  Soyla Norton, MD  Device Type:  Pacemaker Manufacturer and Phone #:  St. Jude/Abbott: [phone] Pacemaker Dependent?:  No. Date of Last Device Check:  09/26/2024      Normal Device Function?:  Yes.    Electrophysiologist's Recommendations:  Have magnet available.  Provide continuous ECG monitoring when magnet is used or reprogramming is to be performed.   Procedure may interfere with device function.  Magnet should be placed over device during procedure. )         Anesthesia Quick Evaluation

## 2024-09-29 NOTE — Progress Notes (Signed)
 PCP - Charmaine Bright PA Cardiologist - Dr. Oneil Parchment EP - Dr. Ole Holts  PPM/ICD - St. Jude Abbott Pacemaker Device Orders - received on chart Rep Notified - no rep notified  Chest x-ray - n/a EKG - 08-22-24 Stress Test - denies ECHO - 01-01-23 Cardiac Cath - 02-01-17  Sleep Study - 02-17-23, positive CPAP - per patient does not use as she can not tolerate  Fasting Blood Sugar - 90-160's Checks Blood Sugar - wears a dexcom on right arm  Last dose of GLP1 agonist-  Trulicity - per patient last dose taken on 09-27-24 GLP1 instructions:  hold for 7 day's  Blood Thinner Instructions: Eliquis  - hold for 2 day's.  LD 09-29-24; per patient last dose on 09-28-24 Aspirin  Instructions: denies  ERAS Protcol - clears until 1030 PRE-SURGERY Ensure or G2- none  COVID TEST- n/a   Anesthesia review: yes, DM type 2, OSA, HTN, CHF, Pacemaker   Patient denies shortness of breath, fever, cough and chest pain at PAT appointment. Patient denies any respiratory issues at this time.    All instructions explained to the patient, with a verbal understanding of the material. Patient agrees to go over the instructions while at home for a better understanding. Patient also instructed to self quarantine after being tested for COVID-19. The opportunity to ask questions was provided.

## 2024-10-02 ENCOUNTER — Ambulatory Visit (HOSPITAL_COMMUNITY)
Admission: RE | Admit: 2024-10-02 | Discharge: 2024-10-02 | Disposition: A | Attending: General Surgery | Admitting: General Surgery

## 2024-10-02 ENCOUNTER — Ambulatory Visit (HOSPITAL_COMMUNITY): Admitting: Anesthesiology

## 2024-10-02 ENCOUNTER — Encounter (HOSPITAL_COMMUNITY): Payer: Self-pay | Admitting: General Surgery

## 2024-10-02 ENCOUNTER — Ambulatory Visit (HOSPITAL_COMMUNITY): Admitting: Vascular Surgery

## 2024-10-02 ENCOUNTER — Encounter (HOSPITAL_COMMUNITY): Admission: RE | Disposition: A | Payer: Self-pay | Source: Home / Self Care | Attending: General Surgery

## 2024-10-02 ENCOUNTER — Other Ambulatory Visit: Payer: Self-pay

## 2024-10-02 DIAGNOSIS — F32A Depression, unspecified: Secondary | ICD-10-CM | POA: Diagnosis not present

## 2024-10-02 DIAGNOSIS — I110 Hypertensive heart disease with heart failure: Secondary | ICD-10-CM | POA: Diagnosis not present

## 2024-10-02 DIAGNOSIS — Z6841 Body Mass Index (BMI) 40.0 and over, adult: Secondary | ICD-10-CM | POA: Diagnosis not present

## 2024-10-02 DIAGNOSIS — K8010 Calculus of gallbladder with chronic cholecystitis without obstruction: Secondary | ICD-10-CM | POA: Insufficient documentation

## 2024-10-02 DIAGNOSIS — E119 Type 2 diabetes mellitus without complications: Secondary | ICD-10-CM | POA: Insufficient documentation

## 2024-10-02 DIAGNOSIS — Z794 Long term (current) use of insulin: Secondary | ICD-10-CM | POA: Insufficient documentation

## 2024-10-02 DIAGNOSIS — K449 Diaphragmatic hernia without obstruction or gangrene: Secondary | ICD-10-CM | POA: Diagnosis not present

## 2024-10-02 DIAGNOSIS — Z7984 Long term (current) use of oral hypoglycemic drugs: Secondary | ICD-10-CM | POA: Diagnosis not present

## 2024-10-02 DIAGNOSIS — Z79899 Other long term (current) drug therapy: Secondary | ICD-10-CM | POA: Diagnosis not present

## 2024-10-02 DIAGNOSIS — Z950 Presence of cardiac pacemaker: Secondary | ICD-10-CM | POA: Diagnosis not present

## 2024-10-02 DIAGNOSIS — K429 Umbilical hernia without obstruction or gangrene: Secondary | ICD-10-CM | POA: Insufficient documentation

## 2024-10-02 DIAGNOSIS — K219 Gastro-esophageal reflux disease without esophagitis: Secondary | ICD-10-CM | POA: Insufficient documentation

## 2024-10-02 DIAGNOSIS — F419 Anxiety disorder, unspecified: Secondary | ICD-10-CM | POA: Insufficient documentation

## 2024-10-02 DIAGNOSIS — I509 Heart failure, unspecified: Secondary | ICD-10-CM | POA: Insufficient documentation

## 2024-10-02 DIAGNOSIS — E66813 Obesity, class 3: Secondary | ICD-10-CM | POA: Insufficient documentation

## 2024-10-02 DIAGNOSIS — Z7985 Long-term (current) use of injectable non-insulin antidiabetic drugs: Secondary | ICD-10-CM | POA: Insufficient documentation

## 2024-10-02 DIAGNOSIS — G4730 Sleep apnea, unspecified: Secondary | ICD-10-CM | POA: Diagnosis not present

## 2024-10-02 DIAGNOSIS — Z01818 Encounter for other preprocedural examination: Secondary | ICD-10-CM

## 2024-10-02 HISTORY — PX: UMBILICAL HERNIA REPAIR: SHX196

## 2024-10-02 HISTORY — PX: CHOLECYSTECTOMY: SHX55

## 2024-10-02 LAB — GLUCOSE, CAPILLARY
Glucose-Capillary: 177 mg/dL — ABNORMAL HIGH (ref 70–99)
Glucose-Capillary: 115 mg/dL — ABNORMAL HIGH (ref 70–99)
Glucose-Capillary: 104 mg/dL — ABNORMAL HIGH (ref 70–99)
Glucose-Capillary: 115 mg/dL — ABNORMAL HIGH (ref 70–99)

## 2024-10-02 SURGERY — LAPAROSCOPIC CHOLECYSTECTOMY
Anesthesia: General

## 2024-10-02 MED ORDER — ACETAMINOPHEN 500 MG PO TABS
1000.0000 mg | ORAL_TABLET | ORAL | Status: AC
  Administered 2024-10-02: 13:00:00 1000 mg via ORAL
  Filled 2024-10-02: qty 2

## 2024-10-02 MED ORDER — LACTATED RINGERS IV SOLN: INTRAVENOUS | Status: DC

## 2024-10-02 MED ORDER — SODIUM CHLORIDE 0.9 % IR SOLN
Status: DC | PRN
  Administered 2024-10-02: 17:00:00 1000 mL

## 2024-10-02 MED ORDER — PROPOFOL 10 MG/ML IV BOLUS
INTRAVENOUS | Status: AC
  Filled 2024-10-02: qty 20

## 2024-10-02 MED ORDER — DROPERIDOL 2.5 MG/ML IJ SOLN: 0.6250 mg | Freq: Once | INTRAMUSCULAR | Status: DC | PRN

## 2024-10-02 MED ORDER — LABETALOL HCL 5 MG/ML IV SOLN: INTRAVENOUS | Status: DC | PRN

## 2024-10-02 MED ORDER — CHLORHEXIDINE GLUCONATE CLOTH 2 % EX PADS: 6.0000 | MEDICATED_PAD | Freq: Once | CUTANEOUS | Status: DC

## 2024-10-02 MED ORDER — 0.9 % SODIUM CHLORIDE (POUR BTL) OPTIME
TOPICAL | Status: DC | PRN
  Administered 2024-10-02: 17:00:00 1000 mL

## 2024-10-02 MED ORDER — ONDANSETRON HCL 4 MG/2ML IJ SOLN
INTRAMUSCULAR | Status: DC | PRN
  Administered 2024-10-02: 17:00:00 4 mg via INTRAVENOUS

## 2024-10-02 MED ORDER — CEFAZOLIN SODIUM-DEXTROSE 3-4 GM/150ML-% IV SOLN
INTRAVENOUS | Status: AC
  Filled 2024-10-02: qty 150

## 2024-10-02 MED ORDER — HYDRALAZINE HCL 20 MG/ML IJ SOLN
INTRAMUSCULAR | Status: DC | PRN
  Administered 2024-10-02 (×2): 5 mg via INTRAVENOUS

## 2024-10-02 MED ORDER — FENTANYL CITRATE (PF) 250 MCG/5ML IJ SOLN
INTRAMUSCULAR | Status: AC
  Filled 2024-10-02: qty 5

## 2024-10-02 MED ORDER — FENTANYL CITRATE (PF) 100 MCG/2ML IJ SOLN
25.0000 ug | INTRAMUSCULAR | Status: DC | PRN
  Administered 2024-10-02: 18:00:00 25 ug via INTRAVENOUS

## 2024-10-02 MED ORDER — BUPIVACAINE-EPINEPHRINE (PF) 0.25% -1:200000 IJ SOLN
INTRAMUSCULAR | Status: AC
  Filled 2024-10-02: qty 30

## 2024-10-02 MED ORDER — BUPIVACAINE-EPINEPHRINE 0.25% -1:200000 IJ SOLN
INTRAMUSCULAR | Status: DC | PRN
  Administered 2024-10-02: 17:00:00 30 mL

## 2024-10-02 MED ORDER — PROPOFOL 10 MG/ML IV BOLUS
INTRAVENOUS | Status: DC | PRN
  Administered 2024-10-02: 16:00:00 150 mg via INTRAVENOUS

## 2024-10-02 MED ORDER — IBUPROFEN 800 MG PO TABS: 800.0000 mg | ORAL_TABLET | Freq: Three times a day (TID) | ORAL | 0 refills | Status: AC | PRN

## 2024-10-02 MED ORDER — ROCURONIUM BROMIDE 10 MG/ML (PF) SYRINGE
PREFILLED_SYRINGE | INTRAVENOUS | Status: DC | PRN
  Administered 2024-10-02: 16:00:00 80 mg via INTRAVENOUS

## 2024-10-02 MED ORDER — CEFAZOLIN IN SODIUM CHLORIDE 3-0.9 GM/100ML-% IV SOLN
2.0000 g | INTRAVENOUS | Status: AC
  Administered 2024-10-02: 16:00:00 3 g via INTRAVENOUS
  Filled 2024-10-02: qty 100

## 2024-10-02 MED ORDER — FENTANYL CITRATE (PF) 250 MCG/5ML IJ SOLN
INTRAMUSCULAR | Status: DC | PRN
  Administered 2024-10-02: 17:00:00 50 ug via INTRAVENOUS
  Administered 2024-10-02: 16:00:00 100 ug via INTRAVENOUS
  Administered 2024-10-02 (×2): 50 ug via INTRAVENOUS

## 2024-10-02 MED ORDER — SUGAMMADEX SODIUM 200 MG/2ML IV SOLN
INTRAVENOUS | Status: DC | PRN
  Administered 2024-10-02: 17:00:00 200 mg via INTRAVENOUS

## 2024-10-02 MED ORDER — FENTANYL CITRATE (PF) 100 MCG/2ML IJ SOLN
INTRAMUSCULAR | Status: AC
  Filled 2024-10-02: qty 2

## 2024-10-02 MED ORDER — CHLORHEXIDINE GLUCONATE 0.12 % MT SOLN
15.0000 mL | Freq: Once | OROMUCOSAL | Status: AC
  Administered 2024-10-02: 13:00:00 15 mL via OROMUCOSAL
  Filled 2024-10-02: qty 15

## 2024-10-02 MED ORDER — DEXAMETHASONE SOD PHOSPHATE PF 10 MG/ML IJ SOLN
INTRAMUSCULAR | Status: DC | PRN
  Administered 2024-10-02: 16:00:00 4 mg via INTRAVENOUS

## 2024-10-02 MED ORDER — TRAMADOL HCL 50 MG PO TABS: 50.0000 mg | ORAL_TABLET | Freq: Four times a day (QID) | ORAL | 0 refills | Status: DC | PRN

## 2024-10-02 MED ORDER — ORAL CARE MOUTH RINSE: 15.0000 mL | Freq: Once | OROMUCOSAL | Status: AC

## 2024-10-02 MED ORDER — INSULIN ASPART 100 UNIT/ML IJ SOLN: 0.0000 [IU] | INTRAMUSCULAR | Status: DC | PRN

## 2024-10-02 SURGICAL SUPPLY — 43 items
BAG COUNTER SPONGE SURGICOUNT (BAG) ×1 IMPLANT
BLADE CLIPPER SURG (BLADE) IMPLANT
CANISTER SUCTION 3000ML PPV (SUCTIONS) ×1 IMPLANT
CHLORAPREP W/TINT 26 (MISCELLANEOUS) ×1 IMPLANT
CLIP LIGATING HEMO O LOK GREEN (MISCELLANEOUS) ×1 IMPLANT
COVER SURGICAL LIGHT HANDLE (MISCELLANEOUS) ×1 IMPLANT
DERMABOND ADVANCED .7 DNX12 (GAUZE/BANDAGES/DRESSINGS) ×1 IMPLANT
DRAPE LAPAROTOMY 100X72 PEDS (DRAPES) ×1 IMPLANT
ELECTRODE REM PT RTRN 9FT ADLT (ELECTROSURGICAL) ×1 IMPLANT
GLOVE BIOGEL PI IND STRL 7.0 (GLOVE) ×1 IMPLANT
GLOVE SURG SS PI 7.0 STRL IVOR (GLOVE) ×1 IMPLANT
GOWN STRL REUS W/ TWL LRG LVL3 (GOWN DISPOSABLE) ×3 IMPLANT
GRASPER SUT TROCAR 14GX15 (MISCELLANEOUS) ×1 IMPLANT
IRRIGATION SUCT STRKRFLW 2 WTP (MISCELLANEOUS) ×1 IMPLANT
KIT BASIN OR (CUSTOM PROCEDURE TRAY) ×1 IMPLANT
KIT IMAGING PINPOINTPAQ (MISCELLANEOUS) IMPLANT
KIT TURNOVER KIT B (KITS) ×1 IMPLANT
NDL 22X1.5 STRL (OR ONLY) (MISCELLANEOUS) ×1 IMPLANT
NDL HYPO 22X1.5 SAFETY MO (MISCELLANEOUS) ×1 IMPLANT
NEEDLE 22X1.5 STRL (OR ONLY) (MISCELLANEOUS) ×1 IMPLANT
NEEDLE HYPO 22X1.5 SAFETY MO (MISCELLANEOUS) ×1 IMPLANT
PACK GENERAL/GYN (CUSTOM PROCEDURE TRAY) ×1 IMPLANT
PAD ARMBOARD POSITIONER FOAM (MISCELLANEOUS) ×1 IMPLANT
PENCIL SMOKE EVACUATOR (MISCELLANEOUS) ×1 IMPLANT
POUCH RETRIEVAL ECOSAC 10 (ENDOMECHANICALS) ×1 IMPLANT
SCISSORS LAP 5X35 DISP (ENDOMECHANICALS) ×1 IMPLANT
SET TUBE SMOKE EVAC HIGH FLOW (TUBING) ×1 IMPLANT
SLEEVE Z-THREAD 5X100MM (TROCAR) ×2 IMPLANT
SOLN 0.9% NACL POUR BTL 1000ML (IV SOLUTION) ×1 IMPLANT
SOLN STERILE WATER BTL 1000 ML (IV SOLUTION) ×1 IMPLANT
SUT MNCRL AB 4-0 PS2 18 (SUTURE) ×1 IMPLANT
SUT NOVA 0 T19/GS 22DT (SUTURE) IMPLANT
SUT NOVA 0 T20/GS 25DT 4464 63 (SUTURE) IMPLANT
SUT PDS AB 0 CT 36 (SUTURE) IMPLANT
SUT VIC AB 3-0 SH 27XBRD (SUTURE) ×1 IMPLANT
SUT VICRYL 0 UR6 27IN ABS (SUTURE) IMPLANT
SYR CONTROL 10ML LL (SYRINGE) ×1 IMPLANT
TOWEL GREEN STERILE (TOWEL DISPOSABLE) ×1 IMPLANT
TOWEL GREEN STERILE FF (TOWEL DISPOSABLE) ×1 IMPLANT
TRAY LAPAROSCOPIC MC (CUSTOM PROCEDURE TRAY) ×1 IMPLANT
TROCAR Z THREAD OPTICAL 12X100 (TROCAR) ×1 IMPLANT
TROCAR Z-THREAD OPTICAL 5X100M (TROCAR) ×1 IMPLANT
WARMER LAPAROSCOPE (MISCELLANEOUS) ×1 IMPLANT

## 2024-10-02 NOTE — Op Note (Signed)
 PATIENT:  Linda Washington  73 y.o. female  PRE-OPERATIVE DIAGNOSIS:  GALLSTONES UMBILICAL HERNIA  POST-OPERATIVE DIAGNOSIS:  GALLSTONES UMBILICAL HERNIA  PROCEDURE:  Procedures: LAPAROSCOPIC CHOLECYSTECTOMY REPAIR, HERNIA, UMBILICAL, ADULT  SURGEON:  Lachandra Dettmann, Herlene Righter, MD   ASSISTANT: none  ANESTHESIA:   local and general  Indications for procedure: Linda Washington is a 73 y.o. female with symptoms of Abdominal pain and Nausea and vomiting consistent with gallbladder disease, Confirmed by CT.  Description of procedure: The patient was brought into the operative suite, placed supine. Anesthesia was administered with endotracheal tube. Patient was strapped in place and foot board was secured. All pressure points were offloaded by foam padding. The patient was prepped and draped in the usual sterile fashion.  A right subcostal incision was made and optical entry was used to enter the abdomen. 2 5 mm trocars were placed on in the right lateral space on in the right subcostal space. A 12mm trocar was placed in the periumbilical space. Marcaine  was infused to the subxiphoid space and lateral upper right abdomen in the transversus abdominis plane. Next the patient was placed in reverse trendelenberg. The gallbladder appearedchronically inflamed. Omentum was adhered to the gallbladder and was taken down with cautery/blunt dissection.  The gallbladder was retracted cephalad and lateral. The peritoneum was reflected off the infundibulum working lateral to medial. The cystic duct and cystic artery were identified and further dissection revealed a critical view. The cystic duct and cystic artery were doubly clipped and ligated.   The gallbladder was removed off the liver bed with cautery. The Gallbladder was placed in a specimen bag. The gallbladder fossa was irrigated and hemostasis was applied with cautery. The gallbladder was removed via the 12 mm trocar. Pneumoperitoneum was removed, all  trocar were removed. The periumbilical hernia sac was dissected free and removed. The sac was closed with 3-0 vicryl. The fascia was closed in 0 PDS interrupted fashion. All incisions were closed with 4-0 monocryl subcuticular stitch. The patient woke from anesthesia and was brought to PACU in stable condition. All counts were correct  Findings: 2 cm umbilical hernia, chronic cholecystitis  Specimen: gallbladder  Blood loss: 20 ml  Local anesthesia: 30 ml Marcaine   Complications: none  PLAN OF CARE: Discharge to home after PACU  PATIENT DISPOSITION:  PACU - hemodynamically stable.  Herlene Righter [facility], [place]

## 2024-10-02 NOTE — Transfer of Care (Signed)
 Immediate Anesthesia Transfer of Care Note  Patient: Linda Washington  Procedure(s) Performed: LAPAROSCOPIC CHOLECYSTECTOMY REPAIR, HERNIA, UMBILICAL, ADULT  Patient Location: PACU  Anesthesia Type:General  Level of Consciousness: awake, alert , and oriented  Airway & Oxygen  Therapy: Patient Spontanous Breathing and Patient connected to nasal cannula oxygen   Post-op Assessment: Report given to RN and Post -op Vital signs reviewed and stable  Post vital signs: Reviewed and stable  Last Vitals:  Vitals Value Taken Time  BP 168/100 10/02/24 17:34  Temp 36.6 C 10/02/24 17:34  Pulse 72 10/02/24 17:36  Resp 21 10/02/24 17:36  SpO2 89 % 10/02/24 17:36  Vitals shown include unfiled device data.  Last Pain:  Vitals:   10/02/24 1241  TempSrc:   PainSc: 0-No pain         Complications: No notable events documented.

## 2024-10-02 NOTE — H&P (Signed)
 1 month follow up - umbilical hernia   Subjective   Linda Washington is a 73 y.o. female established patient in today for: History of Present Illness Linda Washington is a 73 year old female who presents for surgical consultation regarding a small umbilical hernia and gallstones.  A CT scan shows a small umbilical hernia above the navel with a base of about two centimeters. Discomfort occurs primarily when bending, with no significant pain otherwise.  Gallstones are present, with occasional sharp pain under the right ribs, particularly after fatty meals. The pain is severe but infrequent. Consumption of fattier ground beef from a specific local source results in hives, diarrhea, and vomiting, but these symptoms do not occur with other meats.  She takes metoprolol daily.  There is no problem list on file for this patient.  Outpatient Medications Prior to Visit  Medication Sig Dispense Refill  AMIOdarone  (PACERONE ) 200 MG tablet Take 200 mg by mouth once daily  apixaban  (ELIQUIS ) 5 mg tablet Take 5 mg by mouth 2 (two) times daily  atorvastatin  (LIPITOR) 40 MG tablet Take 40 mg by mouth at bedtime  carvediloL  (COREG ) 6.25 MG tablet Take 2 tablets in the AM and 1 tablet in the PM  dulaglutide (TRULICITY) 1.5 mg/0.5 mL subcutaneous pen injector Inject 1.5 mg subcutaneously every 7 (seven) days  DULoxetine (CYMBALTA) 30 MG DR capsule Take 30 mg by mouth once daily  empagliflozin  (JARDIANCE ) 25 mg tablet Take 25 mg by mouth once daily  fluticasone propionate (FLONASE) 50 mcg/actuation nasal spray Place 1-2 sprays into both nostrils daily as needed for allergies or rhinitis.  FUROsemide  (LASIX ) 40 MG tablet Take 40 mg by mouth once daily  insulin  GLARGINE (LANTUS) injection (concentration 100 units/mL) Inject 10 Units subcutaneously once daily  metFORMIN  (GLUCOPHAGE ) 1000 MG tablet Take 1,000 mg by mouth 2 (two) times daily with meals   No facility-administered medications prior to visit.    Objective    Vitals:  08/31/24 1012  PainSc: 0-No pain   There is no height or weight on file to calculate BMI. Physical Exam Constitutional:  Appearance: Normal appearance.  HENT:  Head: Normocephalic and atraumatic.  Pulmonary:  Effort: Pulmonary effort is normal.  Abdominal:  Comments: Supraumbilical hernia  Musculoskeletal:  General: Normal range of motion.  Cervical back: Normal range of motion.  Neurological:  General: No focal deficit present.  Mental Status: She is alert and oriented to person, place, and time. Mental status is at baseline.  Psychiatric:  Mood and Affect: Mood normal.  Behavior: Behavior normal.  Thought Content: Thought content normal.   I reviewed CT scan showing 2 cm supraumbilical hernia, calcified gallstones  Assessment/Plan:   Assessment & Plan Umbilical hernia without obstruction or gangrene Small umbilical hernia confirmed by CT, located above the navel, base ~2 cm. No intestine involved. Occasional pain suggests potential need for repair. Discussed benefits of early repair to prevent complications. Explained surgical procedure, risks, and post-operative care. - Schedule surgical repair with mesh placement. - Administer antibiotics during surgery. - Provide post-operative pain management. - Schedule follow-up three weeks post-surgery.  Symptomatic cholelithiasis without cholecystitis or obstruction Gallstones on CT with occasional pain post-fatty meals. Discussed potential complications and surgical removal of gallbladder. Explained laparoscopic procedure, risks, and post-operative expectations. We discussed the etiology of gallstones and probably cause pain. We discussed exacerbating factors including fatty meals. We discussed the details of surgery for removal of the gallbladder including general anesthesia, 4 small incisions in the patient's abdomen, removal of the  patient's gallbladder with the liver and common bile duct, and most likely outpatient  procedure. We discussed risks of common bile duct injury, cystic duct stump leak, injury to liver, bleeding, infection, need for open procedure, and post cholecystectomy syndrome. The patient showed good understanding and wanted to proceed with laparoscopic cholecystectomy with concomitant umbilical hernia.   Diagnoses and all orders for this visit:  Morbid (severe) obesity due to excess calories (CMS-HCC)  Calculus of gallbladder with chronic cholecystitis without obstruction  Umbilical hernia without obstruction or gangrene

## 2024-10-02 NOTE — Anesthesia Procedure Notes (Signed)
 Procedure Name: Intubation Date/Time: 10/02/2024 4:29 PM  Performed by: Lockie Flesher, CRNAPre-anesthesia Checklist: Patient identified, Emergency Drugs available, Suction available and Patient being monitored Patient Re-evaluated:Patient Re-evaluated prior to induction Oxygen  Delivery Method: Circle System Utilized Preoxygenation: Pre-oxygenation with 100% oxygen  Induction Type: IV induction Ventilation: Mask ventilation without difficulty Laryngoscope Size: Mac and 3 Grade View: Grade I Tube type: Oral Tube size: 7.5 mm Number of attempts: 1 Airway Equipment and Method: Stylet and Oral airway Placement Confirmation: ETT inserted through vocal cords under direct vision, positive ETCO2 and breath sounds checked- equal and bilateral Secured at: 22 cm Tube secured with: Tape Dental Injury: Teeth and Oropharynx as per pre-operative assessment

## 2024-10-03 ENCOUNTER — Encounter (HOSPITAL_COMMUNITY): Payer: Self-pay | Admitting: General Surgery

## 2024-10-03 NOTE — Anesthesia Postprocedure Evaluation (Signed)
 Anesthesia Post Note  Patient: Linda Washington  Procedure(s) Performed: LAPAROSCOPIC CHOLECYSTECTOMY REPAIR, HERNIA, UMBILICAL, ADULT     Patient location during evaluation: PACU Anesthesia Type: General Level of consciousness: awake and alert Pain management: pain level controlled Vital Signs Assessment: post-procedure vital signs reviewed and stable Respiratory status: spontaneous breathing, nonlabored ventilation, respiratory function stable and patient connected to nasal cannula oxygen  Cardiovascular status: blood pressure returned to baseline and stable Postop Assessment: no apparent nausea or vomiting Anesthetic complications: no   No notable events documented.  Last Vitals:  Vitals:   10/02/24 1815 10/02/24 1830  BP: (!) 160/71 (!) 154/74  Pulse: 63 71  Resp: 19 19  Temp:  36.6 C  SpO2: 94% 93%    Last Pain:  Vitals:   10/02/24 1806  TempSrc:   PainSc: 5                  Aidric Endicott S

## 2024-10-04 LAB — SURGICAL PATHOLOGY

## 2024-10-04 NOTE — Progress Notes (Signed)
 Remote PPM Transmission

## 2024-10-05 ENCOUNTER — Telehealth: Payer: Self-pay | Admitting: *Deleted

## 2024-10-05 NOTE — Telephone Encounter (Unsigned)
 Copied from CRM #[id number]. Topic: Clinical - Medication Question >> Oct 04, 2024  9:48 AM Linda Washington wrote: Reason for CRM: pt has CT scheduled for 01/07 and had surgery on 12/15 and wants to know if she needs to push the CT back until she sees her surgeon on 11/01/24. Please contact pt so she can resch the CT if needed.

## 2024-10-08 ENCOUNTER — Inpatient Hospital Stay (HOSPITAL_COMMUNITY)

## 2024-10-08 ENCOUNTER — Emergency Department (HOSPITAL_COMMUNITY)

## 2024-10-08 ENCOUNTER — Ambulatory Visit: Payer: Self-pay | Admitting: Pulmonary Disease

## 2024-10-08 ENCOUNTER — Other Ambulatory Visit: Payer: Self-pay

## 2024-10-08 ENCOUNTER — Inpatient Hospital Stay (HOSPITAL_COMMUNITY)
Admission: EM | Admit: 2024-10-08 | Discharge: 2024-10-16 | DRG: 908 | Disposition: A | Discharge status: Skilled Nursing Facility | Attending: Internal Medicine | Admitting: Internal Medicine

## 2024-10-08 DIAGNOSIS — W1839XA Other fall on same level, initial encounter: Secondary | ICD-10-CM | POA: Diagnosis present

## 2024-10-08 DIAGNOSIS — I4892 Unspecified atrial flutter: Secondary | ICD-10-CM | POA: Diagnosis present

## 2024-10-08 DIAGNOSIS — S82852A Displaced trimalleolar fracture of left lower leg, initial encounter for closed fracture: Secondary | ICD-10-CM | POA: Diagnosis present

## 2024-10-08 DIAGNOSIS — I495 Sick sinus syndrome: Secondary | ICD-10-CM | POA: Diagnosis present

## 2024-10-08 DIAGNOSIS — S82202A Unspecified fracture of shaft of left tibia, initial encounter for closed fracture: Secondary | ICD-10-CM | POA: Diagnosis not present

## 2024-10-08 DIAGNOSIS — Y92012 Bathroom of single-family (private) house as the place of occurrence of the external cause: Secondary | ICD-10-CM | POA: Diagnosis not present

## 2024-10-08 DIAGNOSIS — T782XXA Anaphylactic shock, unspecified, initial encounter: Secondary | ICD-10-CM | POA: Diagnosis not present

## 2024-10-08 DIAGNOSIS — Z90710 Acquired absence of both cervix and uterus: Secondary | ICD-10-CM

## 2024-10-08 DIAGNOSIS — Z950 Presence of cardiac pacemaker: Secondary | ICD-10-CM

## 2024-10-08 DIAGNOSIS — R32 Unspecified urinary incontinence: Secondary | ICD-10-CM | POA: Diagnosis present

## 2024-10-08 DIAGNOSIS — Z91014 Allergy to mammalian meats: Secondary | ICD-10-CM

## 2024-10-08 DIAGNOSIS — L89156 Pressure-induced deep tissue damage of sacral region: Secondary | ICD-10-CM | POA: Diagnosis present

## 2024-10-08 DIAGNOSIS — J45909 Unspecified asthma, uncomplicated: Secondary | ICD-10-CM | POA: Diagnosis present

## 2024-10-08 DIAGNOSIS — Z87442 Personal history of urinary calculi: Secondary | ICD-10-CM

## 2024-10-08 DIAGNOSIS — I110 Hypertensive heart disease with heart failure: Secondary | ICD-10-CM | POA: Diagnosis present

## 2024-10-08 DIAGNOSIS — T7809XA Anaphylactic reaction due to other food products, initial encounter: Secondary | ICD-10-CM | POA: Diagnosis present

## 2024-10-08 DIAGNOSIS — Z888 Allergy status to other drugs, medicaments and biological substances status: Secondary | ICD-10-CM

## 2024-10-08 DIAGNOSIS — K219 Gastro-esophageal reflux disease without esophagitis: Secondary | ICD-10-CM | POA: Diagnosis present

## 2024-10-08 DIAGNOSIS — Z96652 Presence of left artificial knee joint: Secondary | ICD-10-CM | POA: Diagnosis present

## 2024-10-08 DIAGNOSIS — E1142 Type 2 diabetes mellitus with diabetic polyneuropathy: Secondary | ICD-10-CM | POA: Diagnosis present

## 2024-10-08 DIAGNOSIS — E1165 Type 2 diabetes mellitus with hyperglycemia: Secondary | ICD-10-CM | POA: Diagnosis not present

## 2024-10-08 DIAGNOSIS — G4733 Obstructive sleep apnea (adult) (pediatric): Secondary | ICD-10-CM | POA: Diagnosis present

## 2024-10-08 DIAGNOSIS — Z9049 Acquired absence of other specified parts of digestive tract: Secondary | ICD-10-CM

## 2024-10-08 DIAGNOSIS — R21 Rash and other nonspecific skin eruption: Secondary | ICD-10-CM | POA: Diagnosis present

## 2024-10-08 DIAGNOSIS — E119 Type 2 diabetes mellitus without complications: Secondary | ICD-10-CM

## 2024-10-08 DIAGNOSIS — G2581 Restless legs syndrome: Secondary | ICD-10-CM | POA: Diagnosis present

## 2024-10-08 DIAGNOSIS — Z7901 Long term (current) use of anticoagulants: Secondary | ICD-10-CM

## 2024-10-08 DIAGNOSIS — M7751 Other enthesopathy of right foot: Secondary | ICD-10-CM

## 2024-10-08 DIAGNOSIS — I10 Essential (primary) hypertension: Secondary | ICD-10-CM | POA: Diagnosis present

## 2024-10-08 DIAGNOSIS — I4891 Unspecified atrial fibrillation: Secondary | ICD-10-CM | POA: Diagnosis present

## 2024-10-08 DIAGNOSIS — Z794 Long term (current) use of insulin: Secondary | ICD-10-CM

## 2024-10-08 DIAGNOSIS — Z8249 Family history of ischemic heart disease and other diseases of the circulatory system: Secondary | ICD-10-CM

## 2024-10-08 DIAGNOSIS — E785 Hyperlipidemia, unspecified: Secondary | ICD-10-CM | POA: Diagnosis present

## 2024-10-08 DIAGNOSIS — Z79899 Other long term (current) drug therapy: Secondary | ICD-10-CM

## 2024-10-08 DIAGNOSIS — T7804XA Anaphylactic reaction due to fruits and vegetables, initial encounter: Secondary | ICD-10-CM | POA: Diagnosis not present

## 2024-10-08 DIAGNOSIS — I5032 Chronic diastolic (congestive) heart failure: Secondary | ICD-10-CM | POA: Diagnosis present

## 2024-10-08 DIAGNOSIS — Z85828 Personal history of other malignant neoplasm of skin: Secondary | ICD-10-CM

## 2024-10-08 DIAGNOSIS — Z7985 Long-term (current) use of injectable non-insulin antidiabetic drugs: Secondary | ICD-10-CM

## 2024-10-08 DIAGNOSIS — T782XXD Anaphylactic shock, unspecified, subsequent encounter: Secondary | ICD-10-CM | POA: Diagnosis not present

## 2024-10-08 DIAGNOSIS — S93432A Sprain of tibiofibular ligament of left ankle, initial encounter: Secondary | ICD-10-CM | POA: Diagnosis present

## 2024-10-08 DIAGNOSIS — F418 Other specified anxiety disorders: Secondary | ICD-10-CM | POA: Diagnosis present

## 2024-10-08 DIAGNOSIS — S82402A Unspecified fracture of shaft of left fibula, initial encounter for closed fracture: Secondary | ICD-10-CM | POA: Diagnosis not present

## 2024-10-08 DIAGNOSIS — N179 Acute kidney failure, unspecified: Secondary | ICD-10-CM | POA: Diagnosis present

## 2024-10-08 DIAGNOSIS — E039 Hypothyroidism, unspecified: Secondary | ICD-10-CM | POA: Diagnosis present

## 2024-10-08 DIAGNOSIS — R55 Syncope and collapse: Secondary | ICD-10-CM | POA: Diagnosis present

## 2024-10-08 DIAGNOSIS — Z885 Allergy status to narcotic agent status: Secondary | ICD-10-CM

## 2024-10-08 DIAGNOSIS — Z91040 Latex allergy status: Secondary | ICD-10-CM

## 2024-10-08 DIAGNOSIS — Z7984 Long term (current) use of oral hypoglycemic drugs: Secondary | ICD-10-CM

## 2024-10-08 HISTORY — DX: Unspecified asthma, uncomplicated: J45.909

## 2024-10-08 HISTORY — DX: Dyspnea, unspecified: R06.00

## 2024-10-08 LAB — BASIC METABOLIC PANEL WITH GFR
Anion gap: 20 — ABNORMAL HIGH (ref 5–15)
BUN: 18 mg/dL (ref 8–23)
CO2: 17 mmol/L — ABNORMAL LOW (ref 22–32)
Calcium: 9.2 mg/dL (ref 8.9–10.3)
Chloride: 105 mmol/L (ref 98–111)
Creatinine, Ser: 1.23 mg/dL — ABNORMAL HIGH (ref 0.44–1.00)
GFR, Estimated: 46 mL/min — ABNORMAL LOW
Glucose, Bld: 303 mg/dL — ABNORMAL HIGH (ref 70–99)
Potassium: 3.9 mmol/L (ref 3.5–5.1)
Sodium: 141 mmol/L (ref 135–145)

## 2024-10-08 LAB — CBC WITH DIFFERENTIAL/PLATELET
Abs Immature Granulocytes: 0.1 K/uL — ABNORMAL HIGH (ref 0.00–0.07)
Basophils Absolute: 0 K/uL (ref 0.0–0.1)
Basophils Relative: 0 %
Eosinophils Absolute: 0.1 K/uL (ref 0.0–0.5)
Eosinophils Relative: 1 %
HCT: 50.5 % — ABNORMAL HIGH (ref 36.0–46.0)
Hemoglobin: 15.8 g/dL — ABNORMAL HIGH (ref 12.0–15.0)
Immature Granulocytes: 1 %
Lymphocytes Relative: 20 %
Lymphs Abs: 1.8 K/uL (ref 0.7–4.0)
MCH: 30.2 pg (ref 26.0–34.0)
MCHC: 31.3 g/dL (ref 30.0–36.0)
MCV: 96.4 fL (ref 80.0–100.0)
Monocytes Absolute: 0.3 K/uL (ref 0.1–1.0)
Monocytes Relative: 3 %
Neutro Abs: 6.5 K/uL (ref 1.7–7.7)
Neutrophils Relative %: 75 %
Platelets: 337 K/uL (ref 150–400)
RBC: 5.24 MIL/uL — ABNORMAL HIGH (ref 3.87–5.11)
RDW: 14.1 % (ref 11.5–15.5)
WBC: 8.8 K/uL (ref 4.0–10.5)
nRBC: 0 % (ref 0.0–0.2)

## 2024-10-08 LAB — MRSA NEXT GEN BY PCR, NASAL: MRSA by PCR Next Gen: NOT DETECTED

## 2024-10-08 LAB — I-STAT CG4 LACTIC ACID, ED: Lactic Acid, Venous: 6.8 mmol/L (ref 0.5–1.9)

## 2024-10-08 LAB — GLUCOSE, CAPILLARY: Glucose-Capillary: 248 mg/dL — ABNORMAL HIGH (ref 70–99)

## 2024-10-08 LAB — CK: Total CK: 115 U/L (ref 38–234)

## 2024-10-08 MED ORDER — DULOXETINE HCL 30 MG PO CPEP
30.0000 mg | ORAL_CAPSULE | Freq: Every day | ORAL | Status: DC
  Administered 2024-10-09 – 2024-10-16 (×8): 30 mg via ORAL
  Filled 2024-10-08 (×7): qty 1

## 2024-10-08 MED ORDER — INSULIN ASPART 100 UNIT/ML FLEXPEN: 4.0000 [IU] | PEN_INJECTOR | Freq: Every day | SUBCUTANEOUS | Status: DC | PRN

## 2024-10-08 MED ORDER — VITAMIN B-12 1000 MCG PO TABS
1000.0000 ug | ORAL_TABLET | Freq: Every day | ORAL | Status: DC
  Administered 2024-10-09 – 2024-10-16 (×8): 1000 ug via ORAL
  Filled 2024-10-08 (×7): qty 1

## 2024-10-08 MED ORDER — DIPHENHYDRAMINE HCL 25 MG PO CAPS
25.0000 mg | ORAL_CAPSULE | Freq: Four times a day (QID) | ORAL | Status: AC
  Administered 2024-10-08 – 2024-10-10 (×8): 25 mg via ORAL
  Filled 2024-10-08 (×8): qty 1

## 2024-10-08 MED ORDER — OXYBUTYNIN CHLORIDE ER 5 MG PO TB24
15.0000 mg | ORAL_TABLET | Freq: Every day | ORAL | Status: DC
  Administered 2024-10-09 – 2024-10-16 (×8): 15 mg via ORAL
  Filled 2024-10-08 (×7): qty 1

## 2024-10-08 MED ORDER — IOHEXOL 350 MG/ML SOLN
75.0000 mL | Freq: Once | INTRAVENOUS | Status: AC | PRN
  Administered 2024-10-08: 75 mL via INTRAVENOUS

## 2024-10-08 MED ORDER — FENTANYL CITRATE (PF) 50 MCG/ML IJ SOSY: 50.0000 ug | PREFILLED_SYRINGE | Freq: Once | INTRAMUSCULAR | Status: DC

## 2024-10-08 MED ORDER — FLUTICASONE PROPIONATE 50 MCG/ACT NA SUSP
1.0000 | Freq: Every day | NASAL | Status: DC | PRN
  Filled 2024-10-08: qty 16

## 2024-10-08 MED ORDER — POLYVINYL ALCOHOL 1.4 % OP SOLN: Freq: Every day | OPHTHALMIC | Status: DC | PRN

## 2024-10-08 MED ORDER — LACTATED RINGERS IV SOLN: INTRAVENOUS | Status: AC

## 2024-10-08 MED ORDER — INSULIN ASPART 100 UNIT/ML IJ SOLN
0.0000 [IU] | Freq: Every day | INTRAMUSCULAR | Status: DC
  Administered 2024-10-08: 2 [IU] via SUBCUTANEOUS
  Filled 2024-10-08: qty 2

## 2024-10-08 MED ORDER — CHLORHEXIDINE GLUCONATE CLOTH 2 % EX PADS
6.0000 | MEDICATED_PAD | Freq: Every day | CUTANEOUS | Status: DC
  Administered 2024-10-08 – 2024-10-13 (×4): 6 via TOPICAL

## 2024-10-08 MED ORDER — ACETAMINOPHEN 325 MG PO TABS
650.0000 mg | ORAL_TABLET | Freq: Three times a day (TID) | ORAL | Status: DC | PRN
  Administered 2024-10-09 – 2024-10-13 (×6): 650 mg via ORAL
  Filled 2024-10-08 (×7): qty 2

## 2024-10-08 MED ORDER — INSULIN ASPART 100 UNIT/ML IJ SOLN
0.0000 [IU] | Freq: Three times a day (TID) | INTRAMUSCULAR | Status: DC
  Administered 2024-10-09: 2 [IU] via SUBCUTANEOUS
  Administered 2024-10-09: 3 [IU] via SUBCUTANEOUS
  Administered 2024-10-09 – 2024-10-10 (×2): 2 [IU] via SUBCUTANEOUS
  Administered 2024-10-10: 3 [IU] via SUBCUTANEOUS
  Administered 2024-10-11: 5 [IU] via SUBCUTANEOUS
  Administered 2024-10-11 – 2024-10-13 (×5): 3 [IU] via SUBCUTANEOUS
  Administered 2024-10-14: 5 [IU] via SUBCUTANEOUS
  Administered 2024-10-14 – 2024-10-15 (×2): 3 [IU] via SUBCUTANEOUS
  Administered 2024-10-15 – 2024-10-16 (×3): 2 [IU] via SUBCUTANEOUS
  Administered 2024-10-16: 3 [IU] via SUBCUTANEOUS
  Filled 2024-10-08: qty 2
  Filled 2024-10-08: qty 1
  Filled 2024-10-08: qty 3
  Filled 2024-10-08: qty 4
  Filled 2024-10-08: qty 2
  Filled 2024-10-08 (×3): qty 1
  Filled 2024-10-08: qty 3
  Filled 2024-10-08: qty 2
  Filled 2024-10-08: qty 1
  Filled 2024-10-08: qty 3
  Filled 2024-10-08: qty 1
  Filled 2024-10-08: qty 2
  Filled 2024-10-08: qty 3

## 2024-10-08 MED ORDER — EPINEPHRINE HCL 5 MG/250ML IV SOLN IN NS
0.5000 ug/min | INTRAVENOUS | Status: DC
  Administered 2024-10-08: 5 ug/min via INTRAVENOUS

## 2024-10-08 MED ORDER — ATORVASTATIN CALCIUM 40 MG PO TABS
40.0000 mg | ORAL_TABLET | Freq: Every day | ORAL | Status: DC
  Administered 2024-10-08 – 2024-10-15 (×8): 40 mg via ORAL
  Filled 2024-10-08 (×8): qty 1

## 2024-10-08 MED ORDER — PANTOPRAZOLE SODIUM 40 MG PO TBEC: 40.0000 mg | DELAYED_RELEASE_TABLET | Freq: Every day | ORAL | Status: DC

## 2024-10-08 MED ORDER — AMIODARONE HCL 200 MG PO TABS
200.0000 mg | ORAL_TABLET | Freq: Every day | ORAL | Status: DC
  Administered 2024-10-09 – 2024-10-16 (×8): 200 mg via ORAL
  Filled 2024-10-08 (×7): qty 1

## 2024-10-08 MED ORDER — BETAMETHASONE SOD PHOS & ACET 6 (3-3) MG/ML IJ SUSP: 3.0000 mg | Freq: Once | INTRAMUSCULAR | Status: DC

## 2024-10-08 MED ORDER — DOCUSATE SODIUM 100 MG PO CAPS: 100.0000 mg | ORAL_CAPSULE | Freq: Two times a day (BID) | ORAL | Status: DC | PRN

## 2024-10-08 MED ORDER — EPINEPHRINE 0.3 MG/0.3ML IJ SOAJ
0.3000 mg | Freq: Once | INTRAMUSCULAR | Status: AC
  Administered 2024-10-08: 0.3 mg via INTRAMUSCULAR

## 2024-10-08 MED ORDER — IBUPROFEN 200 MG PO TABS
800.0000 mg | ORAL_TABLET | Freq: Three times a day (TID) | ORAL | Status: DC | PRN
  Administered 2024-10-09 – 2024-10-15 (×5): 800 mg via ORAL
  Filled 2024-10-08 (×5): qty 4

## 2024-10-08 MED ORDER — APIXABAN 5 MG PO TABS
5.0000 mg | ORAL_TABLET | Freq: Two times a day (BID) | ORAL | Status: DC
  Administered 2024-10-08 – 2024-10-12 (×9): 5 mg via ORAL
  Filled 2024-10-08 (×9): qty 1

## 2024-10-08 MED ORDER — POLYETHYLENE GLYCOL 3350 17 G PO PACK: 17.0000 g | PACK | Freq: Every day | ORAL | Status: DC | PRN

## 2024-10-08 NOTE — Progress Notes (Signed)
" ° °  Brief Progress Note   _____________________________________________________________________________________________________________  Patient Name: Linda Washington Patient DOB: 1950-11-21 Date: @TODAY @      Data: Patient currently in [facility] ED awaiting ICU placement. Admission orders indicate that the patient can be admitted to either [facility] or [facility]. An ICU bed at [facility] has been assigned.    Action: Reached out to Dr. Claudene to inquire if he had any concerns with the patient being admitted to [facility].    Response:  Dr. Claudene mentioned that the patient will likely be discharged home tomorrow. Patient remains in the [facility] ED at this time. An ICU bed at [facility] will be assigned.  _____________________________________________________________________________________________________________  [facility] RN Expeditor Shondrika Hoque S Katrece Roediger Please contact us  directly via secure chat (search for [facility]) or by calling us  at [phone] [facility]).  "

## 2024-10-08 NOTE — Progress Notes (Signed)
" °  Patient arrived to unit with left leg bruised, swollen and in pain. Faint pulse palpated, capillary refill is greater than 3 seconds. Patient states this started today and confirmed that she has not fallen. Will page attending to make them aware.    "

## 2024-10-08 NOTE — ED Notes (Signed)
 [facility] called for transport to [facility]. No ETA.

## 2024-10-08 NOTE — ED Notes (Signed)
 Lactic acid results to jenny k.rn by at

## 2024-10-08 NOTE — ED Notes (Signed)
 Report called to deanna RN [facility] ICU

## 2024-10-08 NOTE — ED Notes (Signed)
 Pt became unresponsive with low bp. MD notified.

## 2024-10-08 NOTE — ED Triage Notes (Signed)
 Pt to the ed from home with a CC of allergic reaction. Pt had pancakes with jam that followed with hives, swelling and eventually respiratory distress. EMS arrives on scene pt was unresponsive. EMS gave epi .3mg . Regained consciousness. Pt denies sob currently. Family gave 50 of benadryl  at home.

## 2024-10-08 NOTE — ED Provider Notes (Signed)
 " [place] EMERGENCY DEPARTMENT AT [facility] Provider Note   CSN: [id number] Arrival date & time: 10/08/24  1419     Patient presents with: Allergic Reaction   Linda Washington is a 73 y.o. female.   73 year old female with past medical history of atrial fibrillation on Eliquis  as well as CHF with recent cholecystectomy presenting to the emergency department today with concern for anaphylaxis.  The patient states that she was feeling well today and was actually eating some pancakes and some sort of jam when she started to develop hives and felt very lightheaded.  She does have a history of allergic reactions in the past.  The patient has had similar issues in the past and did take Benadryl .  Medics gave the patient IM epinephrine , Solu-Medrol , and Pepcid .  The patient did have transient improvement in her symptoms on the way to the emergency department.  On arrival her blood pressures did drop and she was feeling presyncopal.  She is given additional IM and epinephrine  on minus evaluation.   Allergic Reaction Presenting symptoms: rash        Prior to Admission medications  Medication Sig Start Date End Date Taking? Authorizing Provider  acetaminophen  (TYLENOL ) 650 MG CR tablet Take 650 mg by mouth every 8 (eight) hours as needed for pain.    [provider]  amiodarone  (PACERONE ) 200 MG tablet Take 1 tablet (200 mg total) by mouth daily. 05/03/24   Fenton, Clint R, PA  apixaban  (ELIQUIS ) 5 MG TABS tablet Take 1 tablet (5 mg total) by mouth 2 (two) times daily. 05/02/24 05/02/25  Fenton, Clint R, PA  atorvastatin  (LIPITOR) 40 MG tablet TAKE 1 TABLET BY MOUTH AT  BEDTIME 08/29/24   Jeffrie Oneil BROCKS, MD  carvedilol  (COREG ) 6.25 MG tablet Take 2 tablets in the AM and 1 tablet in the PM 01/20/24   Fenton, Clint R, PA  Continuous Glucose Sensor (DEXCOM G7 SENSOR) MISC change sensor every 10 days; Duration: 90 days 12/03/23   [provider]  cyanocobalamin  (VITAMIN  B12) 1000 MCG tablet Take 1,000 mcg by mouth daily.    [provider]  Dulaglutide (TRULICITY) 1.5 MG/0.5ML SOAJ Inject 1.5 mg into the skin once a week.    [provider]  DULoxetine  (CYMBALTA ) 30 MG capsule Take 30 mg by mouth daily.    [provider]  empagliflozin  (JARDIANCE ) 25 MG TABS tablet Take 25 mg by mouth daily.    [provider]  fluticasone  (FLONASE ) 50 MCG/ACT nasal spray Place 1-2 sprays into both nostrils daily as needed for allergies or rhinitis.    [provider]  furosemide  (LASIX ) 40 MG tablet TAKE 1 TABLET EVERY DAY Patient taking differently: Take 40 mg by mouth daily as needed for fluid or edema. 06/15/23   Jeffrie Oneil BROCKS, MD  ibuprofen  (ADVIL ) 800 MG tablet Take 1 tablet (800 mg total) by mouth every 8 (eight) hours as needed. 10/02/24   Kinsinger, Herlene Righter, MD  insulin  glargine (LANTUS) 100 UNIT/ML injection Inject 10 Units into the skin in the morning.    [provider]  metFORMIN  (GLUCOPHAGE ) 1000 MG tablet Take 1,000 mg by mouth 2 (two) times daily with a meal.    [provider]  NOVOLOG  FLEXPEN 100 UNIT/ML FlexPen Inject 4 Units into the skin daily as needed (blood sugar above 200). 05/26/21   [provider]  omeprazole (PRILOSEC) 40 MG capsule Take 40 mg by mouth daily.    [provider]  oxybutynin  (DITROPAN  XL) 15 MG 24 hr tablet Take 15 mg by mouth daily.    [provider]  Polyethyl Glycol-Propyl Glycol (SYSTANE OP) Place 1-2 drops into both eyes daily as needed (dry eyes).    [provider]  traMADol  (ULTRAM ) 50 MG tablet Take 1 tablet (50 mg total) by mouth every 6 (six) hours as needed for moderate pain (pain score 4-6) or severe pain (pain score 7-10). 10/02/24   Kinsinger, Herlene Righter, MD    Allergies: Cyclobenzaprine, Gabapentin , Hydrocodone, Methocarbamol , Oxycodone , Tinidazole, and Latex    Review of Systems  Skin:  Positive for rash.   Neurological:  Positive for light-headedness.  All other systems reviewed and are negative.   Updated Vital Signs BP (!) 104/90   Pulse 69   Resp 15   Ht 5' 6 (1.676 m)   Wt 123 kg   SpO2 95%   BMI 43.77 kg/m   Physical Exam Vitals and nursing note reviewed.   Gen: Somnolent but arousable to verbal stimuli Eyes: PERRL, EOMI HEENT: no oropharyngeal swelling Neck: trachea midline Resp: clear to auscultation bilaterally Card: RRR, no murmurs, rubs, or gallops Abd: Laparoscopic surgery sites appear to be healing well with minimal serous drainage from the periumbilical site, no overlying erythema noted Extremities: no calf tenderness, no edema Vascular: 2+ radial pulses bilaterally, 2+ DP pulses bilaterally Skin: Hives noted over the patient's upper chest and extremities Psyc: acting appropriately   (all labs ordered are listed, but only abnormal results are displayed) Labs Reviewed  I-STAT CG4 LACTIC ACID, ED - Abnormal; Notable for the following components:      Result Value   Lactic Acid, Venous 6.8 (*)    All other components within normal limits  CBC WITH DIFFERENTIAL/PLATELET  BASIC METABOLIC PANEL WITH GFR    EKG: None  Radiology: DG Chest Portable 1 View Result Date: 10/08/2024 CLINICAL DATA:  Shortness of breath. EXAM: PORTABLE CHEST 1 VIEW COMPARISON:  Chest radiograph dated 06/25/2023. FINDINGS: There is mild cardiomegaly with mild vascular congestion. Left lung base atelectasis/scarring. Pneumonia is not excluded. No pleural effusion pneumothorax. Left pectoral pacemaker device. No acute osseous pathology. IMPRESSION: 1. Mild cardiomegaly with mild vascular congestion. 2. Left lung base atelectasis/scarring. Electronically Signed   By: Vanetta Chou M.D.   On: 10/08/2024 14:54     Procedures   Medications Ordered in the ED  EPINEPHrine  (ADRENALIN ) 5 mg in NS 250 mL (0.02 mg/mL) premix infusion (15 mcg/min Intravenous Rate/Dose Change 10/08/24 1453)   EPINEPHrine  (EPI-PEN) injection 0.3 mg (0.3 mg Intramuscular Given 10/08/24 1439)                                    Medical Decision Making 73 year old female with recent laparoscopic cholecystectomy presenting to the emergency department today with concern for anaphylaxis.  Patient given a second dose of IM epinephrine  here.  She remained hypotensive after.  The patient started an epinephrine  infusion.  Labs are ordered as well as lactic acid.  She was reporting some shortness of breath.  I do not appreciate any significant wheezes but DuoNeb is ordered.  X-rays ordered as well.  She is given IV fluids here.  With her requiring the epinephrine  infusion a call was placed to the ICU for further management.  CRITICAL CARE Performed by: Prentice JONELLE Medicus   Total critical care time: 40 minutes  Critical care time was exclusive  of separately billable procedures and treating other patients.  Critical care was necessary to treat or prevent imminent or life-threatening deterioration.  Critical care was time spent personally by me on the following activities: development of treatment plan with patient and/or surrogate as well as nursing, discussions with consultants, evaluation of patient's response to treatment, examination of patient, obtaining history from patient or surrogate, ordering and performing treatments and interventions, ordering and review of laboratory studies, ordering and review of radiographic studies, pulse oximetry and re-evaluation of patient's condition.   Amount and/or Complexity of Data Reviewed Labs: ordered. Radiology: ordered.  Risk Prescription drug management. Decision regarding hospitalization.        Final diagnoses:  Anaphylaxis, initial encounter    ED Discharge Orders     None          Ula Prentice SAUNDERS, MD 10/08/24 [phone]  "

## 2024-10-08 NOTE — Progress Notes (Signed)
 Now some increased leg pain RN to outline area of redness F/u BMP if Cr okay CTA LLE otherwise just do dry scan Did not have trauma so a bit unclear what this is Signed out to night team.

## 2024-10-08 NOTE — Progress Notes (Signed)
 Swelling of extremity increased and new blisters appearing on leg. MD notified.  Media Information   Document Information  Photographic Image: Photos    10/08/2024 21:00  Attached To:  Hospital Encounter on 10/08/24  Source Information  Javiana Anwar L, RN  [facility]

## 2024-10-08 NOTE — ED Notes (Signed)
Report called  to Carly RN

## 2024-10-08 NOTE — Progress Notes (Addendum)
 eLink Physician-Brief Progress Note Patient Name: Linda Washington DOB: [birth year] MRN: [id number]   Date of Service  10/08/2024  HPI/Events of Note  Left leg swelling, bruising, erythema and now blisters are forming.  BMP with creatinine at 1.23 which is mildly elevated compared to last check  eICU Interventions  Will get lower extremity CT angiography of the lower extremities   2338 -CT imaging reviewed with appropriate distal runoffs, seems like there is a distal left lower extremity comminuted fibula fracture in the area of skin changes.  Discussed with Ortho, consult placed  Intervention Category Intermediate Interventions: Infection - evaluation and management  Linda Washington 10/08/2024, 9:53 PM

## 2024-10-08 NOTE — Consult Note (Signed)
 WOC Nurse Consult Note: Reason for Consult:  Sacral MASD vs. Pressure injury, what should we put on it. pic in chart  Wound type: Deep Tissue Pressure Injury Pressure Injury POA: Yes/ Measurement: see nursing flow sheets Wound bed: dark purple areas on the sacrum  Drainage (amount, consistency, odor) see nursing flow sheet Periwound: intact  Dressing procedure/placement/frequency: Cleanse sacral wound with saline, pat dry Apply single layer of xeroform and top with foam. Change daily Notify WOC nursing for acute changes.  Turn and reposition per hospital policy Low air loss mattress in place while in theICU   Re consult if needed, will not follow at this time. Thanks  Aily Tzeng [employer], RN,CWOCN, CNS, [employer] [phone]

## 2024-10-08 NOTE — H&P (Signed)
 "  NAME:  Linda Washington, MRN:  [id number], DOB:  May 13, 1951, LOS: 0 ADMISSION DATE:  10/08/2024, CONSULTATION DATE:  10/08/24 REFERRING MD:  EDP, CHIEF COMPLAINT:  allergic reaction   History of Present Illness:  73 year old woman w/ hx of allergies and previous anaphylactic reactions, multiple chronic illnesses listed below who is presenting with hives, swelling, resp distress followed by witnessed syncope after eating pancakes and jelly.  Received epi, benadryl .  BP remained low in ER despite fluids so PCCM to admit.  Currently she feels improved but jittery.  Pertinent  Medical History   Past Medical History:  Diagnosis Date   Acute on chronic diastolic (congestive) heart failure (HCC) 06/2018   Anxiety    takes Citalopram  daily   Arthritis    generalized   Basal cell carcinoma    GERD (gastroesophageal reflux disease)    takes Omeprazole daily   History of Bell's palsy    left side   History of bronchitis    History of hiatal hernia    History of kidney stones    Hyperlipemia    takes Simvastatin  daily   Hypertension    takes Enalapril  daily   Hypothyroidism    Incontinence    takes Ditropan  daily   Joint swelling    left knee   Neuropathy    takes Gabapentin  daily   PONV (postoperative nausea and vomiting)    Presence of permanent cardiac pacemaker    RLS (restless legs syndrome)    Seasonal allergies    takes Claritin  daioy as needed   Sleep apnea    pt. states that she does not have a CPAP   Tremor    Type 2 diabetes mellitus (HCC)    takes Metformin  and Amaryl  daily     Significant Hospital Events: Including procedures, antibiotic start and stop dates in addition to other pertinent events   12/21 admit  Interim History / Subjective:  admit  Objective    Blood pressure (!) 124/53, pulse 68, temperature 97.8 F (36.6 C), temperature source Temporal, resp. rate 15, height 5' 6 (1.676 m), weight 123 kg, SpO2 98%.       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 GFR: Estimated Creatinine Clearance: 69.2 mL/min (by C-G formula based on SCr of 0.94 mg/dL). Recent Labs  Lab  10/08/24 1438 10/08/24 1452  WBC 8.8  --   LATICACIDVEN  --  6.8*    Liver Function Tests: No results for input(s): AST, ALT, ALKPHOS, BILITOT, PROT, ALBUMIN in the last 168 hours. No results for input(s): LIPASE, AMYLASE in the last 168 hours. No results for input(s): AMMONIA in the last 168 hours.  ABG    Component Value Date/Time   TCO2 24 02/01/2017 0430     Coagulation Profile: No results for input(s): INR, PROTIME in the last 168 hours.  Cardiac Enzymes: No results for input(s): CKTOTAL, CKMB, CKMBINDEX, TROPONINI in the last 168 hours.  HbA1C: Hgb A1c MFr Bld  Date/Time Value Ref Range Status  09/29/2024  11:30 AM 6.8 (H) 4.8 - 5.6 % Final    Comment:    (NOTE) Diagnosis of Diabetes The following HbA1c ranges recommended by the American Diabetes Association (ADA) may be used as an aid in the diagnosis of diabetes mellitus.  Hemoglobin             Suggested A1C NGSP%              Diagnosis  <5.7                   Non Diabetic  5.7-6.4                Pre-Diabetic  >6.4                   Diabetic  <7.0                   Glycemic control for                       adults with diabetes.    09/03/2021 09:24 PM 7.7 (H) 4.8 - 5.6 % Final    Comment:    (NOTE) Pre diabetes:          5.7%-6.4%  Diabetes:              >6.4%  Glycemic control for   <7.0% adults with diabetes     CBG: Recent Labs  Lab 10/02/24 1145 10/02/24 1358 10/02/24 1602 10/02/24 1737  GLUCAP 177* 115* 104* 115*    Review of Systems:    Positive Symptoms in bold:  Constitutional fevers, chills, weight loss, fatigue, anorexia, malaise  Eyes decreased vision, double vision, eye irritation  Ears, Nose, Mouth, Throat sore throat, trouble swallowing, sinus congestion  Cardiovascular chest pain, paroxysmal nocturnal dyspnea, lower ext edema, palpitations   Respiratory SOB, cough, DOE, hemoptysis, wheezing  Gastrointestinal nausea, vomiting, diarrhea  Genitourinary burning with urination, trouble urinating  Musculoskeletal joint aches, joint swelling, back pain  Integumentary  rashes, skin lesions  Neurological focal weakness, focal numbness, trouble speaking, headaches  Psychiatric depression, anxiety, confusion  Endocrine polyuria, polydipsia, cold intolerance, heat intolerance  Hematologic abnormal bruising, abnormal bleeding, unexplained nose bleeds  Allergic/Immunologic recurrent infections, hives, swollen lymph nodes     Past Medical History:  She,  has a past medical history of Acute on chronic diastolic (congestive) heart failure (HCC) (06/2018), Anxiety, Arthritis, Basal cell carcinoma, GERD  (gastroesophageal reflux disease), History of Bell's palsy, History of bronchitis, History of hiatal hernia, History of kidney stones, Hyperlipemia, Hypertension, Hypothyroidism, Incontinence, Joint swelling, Neuropathy, PONV (postoperative nausea and vomiting), Presence of permanent cardiac pacemaker, RLS (restless legs syndrome), Seasonal allergies, Sleep apnea, Tremor, and Type 2 diabetes mellitus (HCC).   Surgical History:   Past Surgical  History:  Procedure Laterality Date   ACHILLES TENDON REPAIR Right    APPENDECTOMY     BASAL CELL CARCINOMA EXCISION     Nose   BREAST LUMPECTOMY Left    CARDIAC CATHETERIZATION     pt. states approximately 15 years ago   CARDIOVERSION N/A 06/22/2018   Procedure: CARDIOVERSION;  Surgeon: Lonni Slain, MD;  Location: [facility] ENDOSCOPY;  Service: Cardiovascular;  Laterality: N/A;   CARPAL TUNNEL RELEASE Bilateral    CHOLECYSTECTOMY N/A 10/02/2024   Procedure: LAPAROSCOPIC CHOLECYSTECTOMY;  Surgeon: Stevie Herlene Righter, MD;  Location: [facility] OR;  Service: General;  Laterality: N/A;   DILATION AND CURETTAGE OF UTERUS     EYE SURGERY Bilateral    cataract surgery   KNEE ARTHROSCOPY     Right   LEFT HEART CATH AND CORONARY ANGIOGRAPHY N/A 02/01/2017   Procedure: Left Heart Cath and Coronary Angiography;  Surgeon: Dorn JINNY Lesches, MD;  Location: [facility] INVASIVE CV LAB;  Service: Cardiovascular;  Laterality: N/A;   LITHOTRIPSY  2013   PACEMAKER IMPLANT N/A 06/25/2023   Procedure: PACEMAKER IMPLANT;  Surgeon: Inocencio Soyla Lunger, MD;  Location: [facility] INVASIVE CV LAB;  Service: Cardiovascular;  Laterality: N/A;   TEE WITHOUT CARDIOVERSION N/A 06/22/2018   Procedure: TRANSESOPHAGEAL ECHOCARDIOGRAM (TEE);  Surgeon: Lonni Slain, MD;  Location[facility] ENDOSCOPY;  Service: Cardiovascular;  Laterality: N/A;   TOTAL ABDOMINAL HYSTERECTOMY     TOTAL KNEE ARTHROPLASTY Left 04/08/2015   Procedure: LEFT TOTAL KNEE ARTHROPLASTY;  Surgeon: Dempsey Sensor, MD;  Location: [facility]  OR;  Service: Orthopedics;  Laterality: Left;   UMBILICAL HERNIA REPAIR N/A 10/02/2024   Procedure: REPAIR, HERNIA, UMBILICAL, ADULT;  Surgeon: Kinsinger, Herlene Righter, MD;  Location: [facility] OR;  Service: General;  Laterality: N/A;     Social History:   reports that she has never smoked. She has never been exposed to tobacco smoke. She has never used smokeless tobacco. She reports current alcohol  use. She reports that she does not use drugs.   Family History:  Her family history includes CAD (age of onset: 50) in her brother; Heart disease in her father and mother; Heart disease (age of onset: 55) in her brother; Heart failure in her father; Heart failure (age of onset: 56) in her mother.   Allergies Allergies[1]   Home Medications  Prior to Admission medications  Medication Sig Start Date End Date Taking? Authorizing Provider  acetaminophen  (TYLENOL ) 650 MG CR tablet Take 650 mg by mouth every 8 (eight) hours as needed for pain.   Yes [provider]  amiodarone  (PACERONE ) 200 MG tablet Take 1 tablet (200 mg total) by mouth daily. 05/03/24  Yes Fenton, Clint R, PA  apixaban  (ELIQUIS ) 5 MG TABS tablet Take 1 tablet (5 mg total) by mouth 2 (two) times daily. 05/02/24 05/02/25 Yes Fenton, Clint R, PA  atorvastatin  (LIPITOR) 40 MG tablet TAKE 1 TABLET BY MOUTH AT  BEDTIME 08/29/24  Yes Jeffrie Oneil BROCKS, MD  carvedilol  (COREG ) 6.25 MG tablet Take 2 tablets in the AM and 1 tablet in the PM 01/20/24  Yes Fenton, Clint R, PA  cyanocobalamin  (VITAMIN B12) 1000 MCG tablet Take 1,000 mcg by mouth daily.   Yes [provider]  Dulaglutide (TRULICITY) 1.5 MG/0.5ML SOAJ Inject 1.5 mg into the skin once a week.   Yes [provider]  DULoxetine  (CYMBALTA ) 30 MG capsule Take 30 mg by mouth daily.   Yes [provider]  empagliflozin  (JARDIANCE ) 25 MG TABS tablet Take 25 mg by mouth  daily.   Yes [provider]  fluticasone  (FLONASE ) 50 MCG/ACT nasal spray Place 1-2 sprays  into both nostrils daily as needed for allergies or rhinitis.   Yes [provider]  furosemide  (LASIX ) 40 MG tablet TAKE 1 TABLET EVERY DAY Patient taking differently: Take 40 mg by mouth daily as needed for fluid or edema. 06/15/23  Yes Jeffrie Oneil BROCKS, MD  ibuprofen  (ADVIL ) 800 MG tablet Take 1 tablet (800 mg total) by mouth every 8 (eight) hours as needed. 10/02/24  Yes Kinsinger, Herlene Righter, MD  insulin  glargine (LANTUS) 100 UNIT/ML injection Inject 10 Units into the skin in the morning.   Yes [provider]  metFORMIN  (GLUCOPHAGE ) 1000 MG tablet Take 1,000 mg by mouth 2 (two) times daily with a meal.   Yes [provider]  NOVOLOG  FLEXPEN 100 UNIT/ML FlexPen Inject 4 Units into the skin daily as needed (blood sugar above 200). 05/26/21  Yes [provider]  nystatin powder Apply 1 Application topically daily as needed.   Yes [provider]  omeprazole (PRILOSEC) 40 MG capsule Take 40 mg by mouth daily.   Yes [provider]  oxybutynin  (DITROPAN  XL) 15 MG 24 hr tablet Take 15 mg by mouth daily.   Yes [provider]  Polyethyl Glycol-Propyl Glycol (SYSTANE OP) Place 1-2 drops into both eyes daily as needed (dry eyes).   Yes [provider]  traMADol  (ULTRAM ) 50 MG tablet Take 1 tablet (50 mg total) by mouth every 6 (six) hours as needed for moderate pain (pain score 4-6) or severe pain (pain score 7-10). 10/02/24  Yes Kinsinger, Herlene Righter, MD  Continuous Glucose Sensor (DEXCOM G7 SENSOR) MISC change sensor every 10 days; Duration: 90 days 12/03/23   [provider]     Critical care time: 32 mins              [1]  Allergies Allergen Reactions   Bovine (Beef) Protein-Containing Drug Products Anaphylaxis   Other Anaphylaxis    Sweet jelly   Cyclobenzaprine Other (See Comments)    Other Reaction(s): makes pt funky headed   Gabapentin  Other (See Comments)    Other Reaction(s): cognitive affect    Hydrocodone Other (See Comments)    Weird sensations mentally; makes me fuzzy   Methocarbamol  Hives and Other (See Comments)   Oxycodone Other (See Comments)    Weird sensations mentally; makes me fuzzy   Tinidazole Hives   Latex Hives and Rash   "

## 2024-10-09 ENCOUNTER — Inpatient Hospital Stay (HOSPITAL_COMMUNITY)

## 2024-10-09 ENCOUNTER — Encounter (HOSPITAL_COMMUNITY): Payer: Self-pay | Admitting: Internal Medicine

## 2024-10-09 DIAGNOSIS — T782XXD Anaphylactic shock, unspecified, subsequent encounter: Secondary | ICD-10-CM | POA: Diagnosis not present

## 2024-10-09 DIAGNOSIS — T782XXA Anaphylactic shock, unspecified, initial encounter: Secondary | ICD-10-CM | POA: Diagnosis not present

## 2024-10-09 LAB — BASIC METABOLIC PANEL WITH GFR
Anion gap: 16 — ABNORMAL HIGH (ref 5–15)
BUN: 19 mg/dL (ref 8–23)
CO2: 17 mmol/L — ABNORMAL LOW (ref 22–32)
Calcium: 8.7 mg/dL — ABNORMAL LOW (ref 8.9–10.3)
Chloride: 103 mmol/L (ref 98–111)
Creatinine, Ser: 1.06 mg/dL — ABNORMAL HIGH (ref 0.44–1.00)
GFR, Estimated: 55 mL/min — ABNORMAL LOW
Glucose, Bld: 163 mg/dL — ABNORMAL HIGH (ref 70–99)
Potassium: 4.7 mmol/L (ref 3.5–5.1)
Sodium: 136 mmol/L (ref 135–145)

## 2024-10-09 LAB — CBC
HCT: 42.8 % (ref 36.0–46.0)
Hemoglobin: 14.1 g/dL (ref 12.0–15.0)
MCH: 30.6 pg (ref 26.0–34.0)
MCHC: 32.9 g/dL (ref 30.0–36.0)
MCV: 92.8 fL (ref 80.0–100.0)
Platelets: 293 K/uL (ref 150–400)
RBC: 4.61 MIL/uL (ref 3.87–5.11)
RDW: 13.8 % (ref 11.5–15.5)
WBC: 21.4 K/uL — ABNORMAL HIGH (ref 4.0–10.5)
nRBC: 0 % (ref 0.0–0.2)

## 2024-10-09 LAB — GLUCOSE, CAPILLARY
Glucose-Capillary: 128 mg/dL — ABNORMAL HIGH (ref 70–99)
Glucose-Capillary: 151 mg/dL — ABNORMAL HIGH (ref 70–99)
Glucose-Capillary: 122 mg/dL — ABNORMAL HIGH (ref 70–99)
Glucose-Capillary: 122 mg/dL — ABNORMAL HIGH (ref 70–99)

## 2024-10-09 LAB — PHOSPHORUS: Phosphorus: 4.3 mg/dL (ref 2.5–4.6)

## 2024-10-09 LAB — MAGNESIUM: Magnesium: 1.7 mg/dL (ref 1.7–2.4)

## 2024-10-09 MED ORDER — TRAMADOL HCL 50 MG PO TABS
50.0000 mg | ORAL_TABLET | Freq: Once | ORAL | Status: AC
  Administered 2024-10-09: 50 mg via ORAL
  Filled 2024-10-09: qty 1

## 2024-10-09 MED ORDER — FAMOTIDINE 20 MG PO TABS
20.0000 mg | ORAL_TABLET | Freq: Two times a day (BID) | ORAL | Status: DC
  Administered 2024-10-09 – 2024-10-16 (×14): 20 mg via ORAL
  Filled 2024-10-09 (×13): qty 1

## 2024-10-09 MED ORDER — PREDNISONE 20 MG PO TABS
40.0000 mg | ORAL_TABLET | Freq: Every day | ORAL | Status: AC
  Administered 2024-10-10 – 2024-10-13 (×4): 40 mg via ORAL
  Filled 2024-10-09 (×4): qty 2

## 2024-10-09 NOTE — Evaluation (Signed)
 Physical Therapy Evaluation Patient Details Name: Linda Washington MRN: [id number] DOB: 11-02-1950 Today's Date: 10/09/2024  History of Present Illness  73 yo F adm 10/08/24 with hives, edema, anaphylaxis, syncope after eating pancakes, hypotension and Lt ankle fx. PMH: 10/02/24 lap chole with hernia repair. DM, HLD, Aflutter on Coumadin , HTN, CHF, neuropathy, sleep apnea, [place] , anxiety, depression  Clinical Impression  Pt very pleasant and stating she is still recovering from hernia repair and has been sleeping in her lift chair for a couple years. Pt and son live in a very small house that is not WC accessible and no available room for a hospital bed. Pt able to rise from tall bed surface and pivot to chair but could not rise from recliner without mod assist and increased weight on LLE. Pt unable to maintain NWB for functional transfers and heights, nor does she have assist during the day as son works varied shifts, along with stairs to enter make return home extremely challenging. Pt will benefit from acute therapy to maximize mobility, safety and function to decrease burden of care. Patient will benefit from continued inpatient follow up therapy, <3 hours/day         If plan is discharge home, recommend the following: A lot of help with walking and/or transfers;A lot of help with bathing/dressing/bathroom;Assistance with cooking/housework;Assist for transportation;Help with stairs or ramp for entrance   Can travel by private vehicle   No    Equipment Recommendations Wheelchair cushion (measurements PT);Wheelchair (measurements PT);BSC/3in1  Recommendations for Other Services  OT consult    Functional Status Assessment Patient has had a recent decline in their functional status and demonstrates the ability to make significant improvements in function in a reasonable and predictable amount of time.     Precautions / Restrictions Precautions Precautions: Fall;Other (comment) Recall of  Precautions/Restrictions: Intact Restrictions Weight Bearing Restrictions Per Provider Order: Yes LLE Weight Bearing Per Provider Order: Non weight bearing      Mobility  Bed Mobility Overal bed mobility: Needs Assistance Bed Mobility: Sit to Supine, Supine to Sit     Supine to sit: Total assist, HOB elevated Sit to supine: Min assist   General bed mobility comments: pt sleeps in lift chair at baseline so utilized foot egress chair positioning to achieve sitting with pt able to pull forward with rails and scoot to EOB without physical assist. Min assist to transition from sitting to supine    Transfers Overall transfer level: Needs assistance   Transfers: Sit to/from Stand, Bed to chair/wheelchair/BSC Sit to Stand: Min assist, Mod assist Stand pivot transfers: Min assist         General transfer comment: pt min assist to rise from foot egress and stand to RW with physical assist of therapist foot under LLE to keep weight off foot. pt pivoted bed<>recliner. Pt mod assist to rise from lower recliner with pt pushing through LLE grossly 25% with max cues verbal and tactile to decrease weight on LLE for pivot back to bed.    Ambulation/Gait               General Gait Details: unable  Stairs            Wheelchair Mobility     Tilt Bed    Modified Rankin (Stroke Patients Only)       Balance Overall balance assessment: Needs assistance Sitting-balance support: Feet supported, No upper extremity supported Sitting balance-Leahy Scale: Good     Standing balance support: Bilateral upper extremity  supported, During functional activity, Reliant on assistive device for balance Standing balance-Leahy Scale: Poor Standing balance comment: RW in standing with assist to maintain NWB LLE                             Pertinent Vitals/Pain Pain Assessment Pain Assessment: No/denies pain    Home Living Family/patient expects to be discharged to:: Private  residence Living Arrangements: Children Available Help at Discharge: Family;Available PRN/intermittently Type of Home: House Home Access: Stairs to enter   Entrance Stairs-Number of Steps: 5   Home Layout: One level Home Equipment: Agricultural Consultant (2 wheels);Cane - single point;Grab bars - tub/shower      Prior Function Prior Level of Function : Independent/Modified Independent;Driving             Mobility Comments: uses cane, sleeps in lift chair       Extremity/Trunk Assessment   Upper Extremity Assessment Upper Extremity Assessment: Overall WFL for tasks assessed    Lower Extremity Assessment Lower Extremity Assessment: LLE deficits/detail    Cervical / Trunk Assessment Cervical / Trunk Assessment: Kyphotic  Communication   Communication Communication: No apparent difficulties    Cognition Arousal: Alert Behavior During Therapy: WFL for tasks assessed/performed   PT - Cognitive impairments: No apparent impairments                         Following commands: Intact       Cueing Cueing Techniques: Verbal cues, Gestural cues     General Comments      Exercises     Assessment/Plan    PT Assessment Patient needs continued PT services  PT Problem List Decreased strength;Decreased activity tolerance;Decreased balance;Decreased mobility;Decreased safety awareness;Decreased knowledge of precautions;Decreased knowledge of use of DME       PT Treatment Interventions DME instruction;Therapeutic exercise;Neuromuscular re-education;Functional mobility training;Therapeutic activities;Patient/family education    PT Goals (Current goals can be found in the Care Plan section)  Acute Rehab PT Goals Patient Stated Goal: return to walking PT Goal Formulation: With patient/family Time For Goal Achievement: 10/23/24 Potential to Achieve Goals: Fair    Frequency Min 2X/week     Co-evaluation               AM-PAC PT 6 Clicks Mobility   Outcome Measure Help needed turning from your back to your side while in a flat bed without using bedrails?: A Little Help needed moving from lying on your back to sitting on the side of a flat bed without using bedrails?: A Lot Help needed moving to and from a bed to a chair (including a wheelchair)?: A Lot Help needed standing up from a chair using your arms (e.g., wheelchair or bedside chair)?: A Lot Help needed to walk in hospital room?: Total Help needed climbing 3-5 steps with a railing? : Total 6 Click Score: 11    End of Session Equipment Utilized During Treatment: Gait belt Activity Tolerance: Patient tolerated treatment well Patient left: in bed;with call bell/phone within reach;with family/visitor present;with nursing/sitter in room Nurse Communication: Mobility status;Precautions;Weight bearing status PT Visit Diagnosis: Other abnormalities of gait and mobility (R26.89);Difficulty in walking, not elsewhere classified (R26.2);Muscle weakness (generalized) (M62.81)    Time: 8692-8666 PT Time Calculation (min) (ACUTE ONLY): 26 min   Charges:   PT Evaluation $PT Eval Moderate Complexity: 1 Mod PT Treatments $Therapeutic Activity: 8-22 mins PT General Charges $$ ACUTE PT VISIT: 1 Visit  Lenoard SQUIBB, PT Acute Rehabilitation Services Office: [phone]   Lenoard KATHEE Docker 10/09/2024, 1:57 PM

## 2024-10-09 NOTE — Evaluation (Signed)
 Occupational Therapy Evaluation Patient Details Name: Linda Washington MRN: [id number] DOB: 1950-12-25 Today's Date: 10/09/2024   History of Present Illness   73 yo F adm 10/08/24 with hives, edema, anaphylaxis, syncope after eating pancakes, hypotension and Lt ankle fx. PMH: 10/02/24 lap chole with hernia repair. DM, HLD, Aflutter on Coumadin , HTN, CHF, neuropathy, sleep apnea, [place] , anxiety, depression     Clinical Impressions PTA, pt's son lived with her and he works during the day with pt independent in and out of the house. Pt reports sleeping in a recliner at baseline; typically able to perform BADL, IADL, and community mobility. Upon eval, pt with decreased strength and balance. Pt able to stand from EOB with min A, and needing increased assist for transfers from lower surfaces. Will continue to follow. Patient will benefit from continued inpatient follow up therapy, <3 hours/day      If plan is discharge home, recommend the following:   A little help with bathing/dressing/bathroom;Assistance with cooking/housework;A lot of help with walking and/or transfers;Help with stairs or ramp for entrance;Assist for transportation     Functional Status Assessment   Patient has had a recent decline in their functional status and demonstrates the ability to make significant improvements in function in a reasonable and predictable amount of time.     Equipment Recommendations   Other (comment) (defer)     Recommendations for Other Services         Precautions/Restrictions   Precautions Precautions: Fall;Other (comment) Recall of Precautions/Restrictions: Intact Restrictions Weight Bearing Restrictions Per Provider Order: Yes LLE Weight Bearing Per Provider Order: Non weight bearing     Mobility Bed Mobility Overal bed mobility: Needs Assistance Bed Mobility: Sit to Supine, Supine to Sit     Supine to sit: HOB elevated, Contact guard Sit to supine: Contact guard  assist        Transfers Overall transfer level: Needs assistance Equipment used: Rolling walker (2 wheels) Transfers: Sit to/from Stand Sit to Stand: Min assist           General transfer comment: up from EOB      Balance Overall balance assessment: Needs assistance Sitting-balance support: Feet supported, No upper extremity supported Sitting balance-Leahy Scale: Good     Standing balance support: Bilateral upper extremity supported, During functional activity, Reliant on assistive device for balance Standing balance-Leahy Scale: Poor Standing balance comment: RW in standing with assist to maintain NWB LLE                           ADL either performed or assessed with clinical judgement   ADL Overall ADL's : Needs assistance/impaired Eating/Feeding: Sitting;Modified independent   Grooming: Set up;Sitting   Upper Body Bathing: Sitting;Modified independent   Lower Body Bathing: Minimal assistance;Sit to/from stand   Upper Body Dressing : Modified independent;Sitting   Lower Body Dressing: Minimal assistance;Sit to/from stand   Toilet Transfer: Minimal assistance;Stand-pivot;Rolling walker (2 wheels);BSC/3in1;Moderate assistance           Functional mobility during ADLs: Minimal assistance;Rolling walker (2 wheels) General ADL Comments: for SPT     Vision Patient Visual Report: No change from baseline       Perception         Praxis         Pertinent Vitals/Pain Pain Assessment Pain Assessment: Faces Faces Pain Scale: Hurts a little bit Pain Location: L ankle Pain Descriptors / Indicators: Sore Pain Intervention(s): Limited activity within patient's tolerance,  Monitored during session     Extremity/Trunk Assessment Upper Extremity Assessment Upper Extremity Assessment: Overall WFL for tasks assessed   Lower Extremity Assessment Lower Extremity Assessment: Defer to PT evaluation   Cervical / Trunk Assessment Cervical / Trunk  Assessment: Kyphotic   Communication Communication Communication: No apparent difficulties   Cognition Arousal: Alert Behavior During Therapy: WFL for tasks assessed/performed Cognition: No apparent impairments                               Following commands: Intact       Cueing  General Comments   Cueing Techniques: Verbal cues;Gestural cues      Exercises     Shoulder Instructions      Home Living Family/patient expects to be discharged to:: Private residence Living Arrangements: Children Available Help at Discharge: Family;Available PRN/intermittently Type of Home: House Home Access: Stairs to enter Entrance Stairs-Number of Steps: 5   Home Layout: One level     Bathroom Shower/Tub: Tub/shower unit;Curtain     Bathroom Accessibility: No   Home Equipment: Agricultural Consultant (2 wheels);Cane - single point;Grab bars - tub/shower          Prior Functioning/Environment Prior Level of Function : Independent/Modified Independent;Driving             Mobility Comments: uses cane, sleeps in lift chair ADLs Comments: mod I for ADL, grocery shopping    OT Problem List: Decreased strength;Decreased activity tolerance;Impaired balance (sitting and/or standing);Decreased knowledge of use of DME or AE;Decreased knowledge of precautions;Pain   OT Treatment/Interventions: Self-care/ADL training;Therapeutic exercise;DME and/or AE instruction;Therapeutic activities;Patient/family education;Balance training      OT Goals(Current goals can be found in the care plan section)   Acute Rehab OT Goals Patient Stated Goal: get better OT Goal Formulation: With patient Time For Goal Achievement: 10/23/24 Potential to Achieve Goals: Good   OT Frequency:  Min 2X/week    Co-evaluation              AM-PAC OT 6 Clicks Daily Activity     Outcome Measure Help from another person eating meals?: None Help from another person taking care of personal  grooming?: A Little Help from another person toileting, which includes using toliet, bedpan, or urinal?: A Little Help from another person bathing (including washing, rinsing, drying)?: A Little Help from another person to put on and taking off regular upper body clothing?: None Help from another person to put on and taking off regular lower body clothing?: A Little 6 Click Score: 20   End of Session Equipment Utilized During Treatment: Gait belt;Rolling walker (2 wheels) Nurse Communication: Mobility status  Activity Tolerance: Patient tolerated treatment well Patient left: in bed;with call bell/phone within reach;with bed alarm set  OT Visit Diagnosis: Unsteadiness on feet (R26.81);Muscle weakness (generalized) (M62.81);Pain Pain - Right/Left: Left Pain - part of body: Ankle and joints of foot                Time: 1455-1517 OT Time Calculation (min): 22 min Charges:  OT General Charges $OT Visit: 1 Visit OT Evaluation $OT Eval Moderate Complexity: 1 Mod  Elma JONETTA Lebron FREDERICK, OTR/L [facility] Acute Rehabilitation Office: [phone]   Elma JONETTA Lebron 10/09/2024, 5:09 PM

## 2024-10-09 NOTE — Progress Notes (Addendum)
 "  NAME:  Linda Washington, MRN:  [id number], DOB:  03/09/1951, LOS: 1 ADMISSION DATE:  10/08/2024, CONSULTATION DATE:  10/08/24 REFERRING MD:  EDP, CHIEF COMPLAINT:  allergic reaction   History of Present Illness:  73 year old woman w/ hx of allergies and previous anaphylactic reactions, multiple chronic illnesses listed below who is presenting with hives, swelling, resp distress followed by witnessed syncope after eating pancakes and jelly.  Received epi, benadryl .  BP remained low in ER despite fluids so PCCM to admit.  Currently she feels improved but jittery.  Pertinent  Medical History   Past Medical History:  Diagnosis Date   Acute on chronic diastolic (congestive) heart failure (HCC) 06/2018   Anxiety    takes Citalopram  daily   Arthritis    generalized   Basal cell carcinoma    GERD (gastroesophageal reflux disease)    takes Omeprazole daily   History of Bell's palsy    left side   History of bronchitis    History of hiatal hernia    History of kidney stones    Hyperlipemia    takes Simvastatin  daily   Hypertension    takes Enalapril  daily   Hypothyroidism    Incontinence    takes Ditropan  daily   Joint swelling    left knee   Neuropathy    takes Gabapentin  daily   PONV (postoperative nausea and vomiting)    Presence of permanent cardiac pacemaker    RLS (restless legs syndrome)    Seasonal allergies    takes Claritin  daioy as needed   Sleep apnea    pt. states that she does not have a CPAP   Tremor    Type 2 diabetes mellitus (HCC)    takes Metformin  and Amaryl  daily    Significant Hospital Events: Including procedures, antibiotic start and stop dates in addition to other pertinent events   12/21 admit  Interim History / Subjective:  Off epi overnight, remains hemodynamically stable, asymptomatic  CT confirms displaced left distal fibula fx> ortho consulted overnight.  Only complaints are L ankle pain  Objective    Blood pressure (!) 117/53, pulse  74, temperature 98.2 F (36.8 C), temperature source Axillary, resp. rate 19, height 5' 6 (1.676 m), weight 121.6 kg, SpO2 96%.        Intake/Output Summary (Last 24 hours) at 10/09/2024 0750 Last data filed at 10/09/2024 9372 Gross per 24 hour  Intake 1266.3 ml  Output 700 ml  Net 566.3 ml   Filed Weights   10/08/24 1425 10/08/24 1800  Weight: 123 kg 121.6 kg    Examination: General:  Older adult female sitting in bed in NAD HEENT: MM pink/moist Neuro: Aox 3, MAE- limited in LLE CV: rr, NSR, no murmur PULM:  non labored, clear GI: soft, obese, +bs, NT, healing lap incisions Extremities: warm/dry, no LE edema.  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  Currently no focal deficits  Cholecystectomy 12/15 GERD - PPI   IDDM - CBG q AC/ HS with prn SSI, controlled  HTN HLD - cont to hold coreg  with normal BP - statin   SSS s/p PPM Afib on AC - remains SR on tele - cont eliquis  and amiodarone    Hypothyroidism- TSH okay 07/03/24 - does not appear to be on meds  AKI, resolving  P:  - good UOP, improving renal indices - repeat BMET in am   Sacral MASD vs pressure injury, POA -  supportive care, recs appreciated from WOC  Left displaced distal fibula fracture - appreciate ortho consult.  Plans for open reduction internal fixation in outpt setting.  S/p splint by ortho tech and to remain non- weight bearing with f/u with Dr. Medford Pae  - PT/ OT consult - tylenol / motrin  for pain   Labs   CBC: Recent Labs  Lab 10/08/24 1438 10/09/24 0252  WBC 8.8 21.4*  NEUTROABS 6.5  --   HGB 15.8* 14.1  HCT 50.5* 42.8  MCV 96.4 92.8  PLT 337 293    Basic Metabolic Panel: Recent Labs  Lab 10/08/24 1844 10/09/24 0252  NA 141 136  K 3.9 4.7  CL 105 103  CO2 17* 17*  GLUCOSE 303* 163*  BUN 18 19  CREATININE 1.23* 1.06*  CALCIUM  9.2 8.7*  MG  --  1.7  PHOS  --  4.3   GFR: Estimated Creatinine Clearance: 61 mL/min (A) (by C-G formula based on SCr of 1.06 mg/dL (H)). Recent Labs  Lab 10/08/24 1438 10/08/24 1452 10/09/24 0252  WBC 8.8  --  21.4*  LATICACIDVEN  --  6.8*  --     Liver Function Tests: No results for input(s): AST, ALT, ALKPHOS, BILITOT, PROT, ALBUMIN in the last 168 hours. No results for input(s): LIPASE, AMYLASE in the last 168 hours. No results for input(s): AMMONIA in the last 168 hours.  ABG    Component Value Date/Time   TCO2 24 02/01/2017 0430     Coagulation Profile: No results for input(s): INR, PROTIME in the last 168 hours.  Cardiac Enzymes: Recent Labs  Lab 10/08/24 1844  CKTOTAL 115    HbA1C: Hgb A1c MFr Bld  Date/Time Value Ref Range Status  09/29/2024 11:30 AM 6.8 (H) 4.8 - 5.6 % Final    Comment:    (NOTE) Diagnosis of Diabetes The following HbA1c ranges recommended by the American Diabetes Association (ADA) may be used as an aid in the diagnosis of diabetes mellitus.  Hemoglobin             Suggested A1C NGSP%              Diagnosis  <5.7                   Non Diabetic  5.7-6.4                Pre-Diabetic  >6.4                   Diabetic  <7.0                   Glycemic  control for  adults with diabetes.    09/03/2021 09:24 PM 7.7 (H) 4.8 - 5.6 % Final    Comment:    (NOTE) Pre diabetes:          5.7%-6.4%  Diabetes:              >6.4%  Glycemic control for   <7.0% adults with diabetes     CBG: Recent Labs  Lab 10/02/24 1145 10/02/24 1358 10/02/24 1602 10/02/24 1737 10/08/24 2110  GLUCAP 177* 115* 104* 115* 248*   Allergies Allergies[1]   Home Medications  Prior to Admission medications  Medication Sig Start Date End Date Taking? Authorizing Provider  acetaminophen  (TYLENOL ) 650 MG CR tablet Take 650 mg by mouth every 8 (eight) hours as needed for pain.   Yes [provider]  amiodarone  (PACERONE ) 200 MG tablet Take 1 tablet (200 mg total) by mouth daily. 05/03/24  Yes Fenton, Clint R, PA  apixaban  (ELIQUIS ) 5 MG TABS tablet Take 1 tablet (5 mg total) by mouth 2 (two) times daily. 05/02/24 05/02/25 Yes Fenton, Clint R, PA  atorvastatin  (LIPITOR) 40 MG tablet TAKE 1 TABLET BY MOUTH AT  BEDTIME 08/29/24  Yes Jeffrie Oneil BROCKS, MD  carvedilol  (COREG ) 6.25 MG tablet Take 2 tablets in the AM and 1 tablet in the PM 01/20/24  Yes Fenton, Clint R, PA  cyanocobalamin  (VITAMIN B12) 1000 MCG tablet Take 1,000 mcg by mouth daily.   Yes [provider]  Dulaglutide (TRULICITY) 1.5 MG/0.5ML SOAJ Inject 1.5 mg into the skin once a week.   Yes [provider]  DULoxetine  (CYMBALTA ) 30 MG capsule Take 30 mg by mouth daily.   Yes [provider]  empagliflozin  (JARDIANCE ) 25 MG TABS tablet Take 25 mg by mouth daily.   Yes [provider]  fluticasone  (FLONASE ) 50 MCG/ACT nasal spray Place 1-2 sprays into both nostrils daily as needed for allergies or rhinitis.   Yes [provider]  furosemide  (LASIX ) 40 MG tablet TAKE 1 TABLET EVERY DAY Patient taking differently: Take 40 mg by mouth daily as needed for fluid or edema. 06/15/23  Yes Jeffrie Oneil BROCKS, MD  ibuprofen  (ADVIL ) 800 MG tablet Take 1  tablet (800 mg total) by mouth every 8 (eight) hours as needed. 10/02/24  Yes Kinsinger, Herlene Righter, MD  insulin  glargine (LANTUS) 100 UNIT/ML injection Inject 10 Units into the skin in the morning.   Yes [provider]  metFORMIN  (GLUCOPHAGE ) 1000 MG tablet Take 1,000 mg by mouth 2 (two) times daily with a meal.   Yes [provider]  NOVOLOG  FLEXPEN 100 UNIT/ML FlexPen Inject 4 Units into the skin daily as needed (blood sugar above 200). 05/26/21  Yes [provider]  nystatin powder Apply 1 Application topically daily as needed.   Yes [provider]  omeprazole (PRILOSEC) 40 MG capsule Take 40 mg by mouth daily.   Yes [provider]  oxybutynin  (DITROPAN  XL) 15 MG 24 hr tablet Take 15 mg by mouth daily.   Yes [provider]  Polyethyl Glycol-Propyl Glycol (SYSTANE OP) Place 1-2 drops into both eyes daily as needed (dry eyes).   Yes [provider]  traMADol  (ULTRAM ) 50 MG tablet Take 1 tablet (50 mg total) by mouth every 6 (six) hours as needed for moderate pain (pain score 4-6) or severe pain (pain score 7-10). 10/02/24  Yes Kinsinger, Herlene Righter, MD  Continuous Glucose Sensor (DEXCOM G7 SENSOR) MISC change sensor every 10 days; Duration: 90 days 12/03/23  [provider]           Lyle Pesa, NP [facility] 10/09/2024, 7:51 AM  See Amion for pager If no response to pager , please call [phone] until 7pm After 7:00 pm call Elink  [phone]           [1]  Allergies Allergen Reactions   Bovine (Beef) Protein-Containing Drug Products Anaphylaxis   Other Anaphylaxis    Sweet jelly   Cyclobenzaprine Other (See Comments)    Other Reaction(s): makes pt funky headed   Gabapentin  Other (See Comments)    Other Reaction(s): cognitive affect   Hydrocodone Other (See Comments)    Weird sensations mentally; makes me fuzzy   Methocarbamol  Hives and Other (See Comments)    Oxycodone Other (See Comments)    Weird sensations mentally; makes me fuzzy   Tinidazole Hives   Latex Hives and Rash   "

## 2024-10-09 NOTE — Progress Notes (Signed)
 Orthopedic Tech Progress Note Patient Details:  Linda Washington 1951-03-31 [id number]  Ortho Devices Type of Ortho Device: Ace wrap, Short leg splint Ortho Device/Splint Location: LLE Ortho Device/Splint Interventions: Ordered, Application, Adjustment Patient had 2 quarter size fracture blisters on leg (RN is aware) I applied a ABD PAD to blisters then applied splint   Post Interventions Patient Tolerated: Well Instructions Provided: Care of device  [name] Pac 10/09/2024, 9:22 AM

## 2024-10-09 NOTE — Consult Note (Signed)
 ORTHOPAEDIC CONSULTATION  REQUESTING PHYSICIAN: Claudene Toribio BROCKS, MD  Chief Complaint: Left ankle fracture  HPI: Linda Washington is a 73 y.o. female who complains of left ankle pain after a syncopal episode yesterday caused by anaphylaxis to jelly.  She has pain and swelling about the ankle.  She denies pain in other joints or extremities.  She had no issues with the ankle prior to the fall yesterday.  Past Medical History:  Diagnosis Date   Acute on chronic diastolic (congestive) heart failure (HCC) 06/2018   Anxiety    takes Citalopram  daily   Arthritis    generalized   Basal cell carcinoma    GERD (gastroesophageal reflux disease)    takes Omeprazole daily   History of Bell's palsy    left side   History of bronchitis    History of hiatal hernia    History of kidney stones    Hyperlipemia    takes Simvastatin  daily   Hypertension    takes Enalapril  daily   Hypothyroidism    Incontinence    takes Ditropan  daily   Joint swelling    left knee   Neuropathy    takes Gabapentin  daily   PONV (postoperative nausea and vomiting)    Presence of permanent cardiac pacemaker    RLS (restless legs syndrome)    Seasonal allergies    takes Claritin  daioy as needed   Sleep apnea    pt. states that she does not have a CPAP   Tremor    Type 2 diabetes mellitus (HCC)    takes Metformin  and Amaryl  daily   Past Surgical History:  Procedure Laterality Date   ACHILLES TENDON REPAIR Right    APPENDECTOMY     BASAL CELL CARCINOMA EXCISION     Nose   BREAST LUMPECTOMY Left    CARDIAC CATHETERIZATION     pt. states approximately 15 years ago   CARDIOVERSION N/A 06/22/2018   Procedure: CARDIOVERSION;  Surgeon: Lonni Slain, MD;  Location: [facility] ENDOSCOPY;  Service: Cardiovascular;  Laterality: N/A;   CARPAL TUNNEL RELEASE Bilateral    CHOLECYSTECTOMY N/A 10/02/2024   Procedure: LAPAROSCOPIC CHOLECYSTECTOMY;  Surgeon: Stevie Herlene Righter, MD;  Location: [facility] OR;  Service:  General;  Laterality: N/A;   DILATION AND CURETTAGE OF UTERUS     EYE SURGERY Bilateral    cataract surgery   KNEE ARTHROSCOPY     Right   LEFT HEART CATH AND CORONARY ANGIOGRAPHY N/A 02/01/2017   Procedure: Left Heart Cath and Coronary Angiography;  Surgeon: Dorn JINNY Lesches, MD;  Location: [facility] INVASIVE CV LAB;  Service: Cardiovascular;  Laterality: N/A;   LITHOTRIPSY  2013   PACEMAKER IMPLANT N/A 06/25/2023   Procedure: PACEMAKER IMPLANT;  Surgeon: Inocencio Soyla Lunger, MD;  Location: [facility] INVASIVE CV LAB;  Service: Cardiovascular;  Laterality: N/A;   TEE WITHOUT CARDIOVERSION N/A 06/22/2018   Procedure: TRANSESOPHAGEAL ECHOCARDIOGRAM (TEE);  Surgeon: Lonni Slain, MD;  Location: [facility] ENDOSCOPY;  Service: Cardiovascular;  Laterality: N/A;   TOTAL ABDOMINAL HYSTERECTOMY     TOTAL KNEE ARTHROPLASTY Left 04/08/2015   Procedure: LEFT TOTAL KNEE ARTHROPLASTY;  Surgeon: Dempsey Sensor, MD;  Location: [facility] OR;  Service: Orthopedics;  Laterality: Left;   UMBILICAL HERNIA REPAIR N/A 10/02/2024   Procedure: REPAIR, HERNIA, UMBILICAL, ADULT;  Surgeon: Kinsinger, Herlene Righter, MD;  Location: [facility] OR;  Service: General;  Laterality: N/A;   Social History   Socioeconomic History   Marital status: Divorced    Spouse name: Not on file  Number of children: Not on file   Years of education: Some colle   Highest education level: Not on file  Occupational History   Occupation: part-time receptionist at [employer]  Tobacco Use   Smoking status: Never    Passive exposure: Never   Smokeless tobacco: Never   Tobacco comments:    Never smoked 11/08/23  Vaping Use   Vaping status: Never Used  Substance and Sexual Activity   Alcohol  use: Yes    Alcohol /week: 0.0 standard drinks of alcohol     Comment: Rare-wine   Drug use: No   Sexual activity: Not Currently  Other Topics Concern   Not on file  Social History Narrative   Lives at home with Elida Factor, roommate   Caffeine use: 1 Coffee day, 1-2 a day  of tea, 1-2 sodas a day       Patient wears upper partials   Social Drivers of Health   Tobacco Use: Low Risk  (10/03/2024)   Received [facility]   Patient History    Smoking Tobacco Use: Never    Smokeless Tobacco Use: Never    Passive Exposure: Never  Financial Resource Strain: Not on file  Food Insecurity: Not on file  Transportation Needs: Not on file  Physical Activity: Not on file  Stress: Not on file  Social Connections: Unknown (03/03/2022)   Received from [facility]   Social Network    Social Network: Not on file  Depression (PHQ2-9): Not on file  Alcohol  Screen: Not on file  Housing: Unknown (07/27/2024)   Received from [facility]   Epic    Unable to Pay for Housing in the Last Year: Not on file    Number of Times Moved in the Last Year: Not on file    At any time in the past 12 months, were you homeless or living in a shelter (including now)?: No  Utilities: Not on file  Health Literacy: Not on file   Family History  Problem Relation Age of Onset   Heart disease Mother    Heart failure Mother 42   Heart disease Father    Heart failure Father    Heart disease Brother 56   CAD Brother 43   Allergies[1]   Positive ROS: All other systems have been reviewed and were otherwise negative with the exception of those mentioned in the HPI and as above.  Physical Exam: General: Alert, no acute distress Cardiovascular: No pedal edema Respiratory: No cyanosis, no use of accessory musculature Skin: No lesions in the area of chief complaint Neurologic: Sensation intact distally Psychiatric: Patient is competent for consent with normal mood and affect  MUSCULOSKELETAL:  Swelling and tenderness diffusely about the ankle, fracture blisters along the medial side of the ankle.  Sensation intact distally dorsal plantar aspect of the foot.  Intact toe dorsiflexion and plantarflexion.  Foot is warm well-perfused.  IMAGING: X-rays  left ankle 3 view demonstrate a displaced distal fibula fracture  Assessment: Principal Problem:   Anaphylaxis  Left displaced distal fibula fracture  Plan: Patient has an acute lateral ankle fracture that is displaced.  Would benefit from open reduction internal fixation.  This can be done in an outpatient setting.  She is to be splinted by the Ortho tech and nonweightbearing on the left lower extremity.  She will follow-up with Dr. Medford Pae, foot ankle specialist, for management of her fracture.    TORIBIO DELENA HIGASHI, MD  Contact information:  Weekdays 7am-5pm epic message Dr. Edna, or call office for patient follow up: [phone] After hours and holidays please check [email or web address] for group call information for [facility]    [1]  Allergies Allergen Reactions   Bovine (Beef) Protein-Containing Drug Products Anaphylaxis   Other Anaphylaxis    Sweet jelly   Cyclobenzaprine Other (See Comments)    Other Reaction(s): makes pt funky headed   Gabapentin  Other (See Comments)    Other Reaction(s): cognitive affect   Hydrocodone Other (See Comments)    Weird sensations mentally; makes me fuzzy   Methocarbamol  Hives and Other (See Comments)   Oxycodone Other (See Comments)    Weird sensations mentally; makes me fuzzy   Tinidazole Hives   Latex Hives and Rash

## 2024-10-09 NOTE — Progress Notes (Signed)
 Orthopedic Tech Progress Note Patient Details:  Linda Washington [birth year] [id number]  Spoke with RN about splinting patient, Will do once she gets her some stronger medicine. At this time patient is in a lot of discomfort    Patient ID: [name], female   DOB: 07-Oct-1951, 73 y.o.   MRN: [id number]  [name] Pac 10/09/2024, 7:54 AM

## 2024-10-10 DIAGNOSIS — S82202A Unspecified fracture of shaft of left tibia, initial encounter for closed fracture: Secondary | ICD-10-CM | POA: Insufficient documentation

## 2024-10-10 DIAGNOSIS — N179 Acute kidney failure, unspecified: Secondary | ICD-10-CM | POA: Insufficient documentation

## 2024-10-10 LAB — BASIC METABOLIC PANEL WITH GFR
Anion gap: 9 (ref 5–15)
BUN: 18 mg/dL (ref 8–23)
CO2: 24 mmol/L (ref 22–32)
Calcium: 8.4 mg/dL — ABNORMAL LOW (ref 8.9–10.3)
Chloride: 107 mmol/L (ref 98–111)
Creatinine, Ser: 0.81 mg/dL (ref 0.44–1.00)
GFR, Estimated: >60 mL/min
Glucose, Bld: 117 mg/dL — ABNORMAL HIGH (ref 70–99)
Potassium: 3.8 mmol/L (ref 3.5–5.1)
Sodium: 140 mmol/L (ref 135–145)

## 2024-10-10 LAB — CBC WITH DIFFERENTIAL/PLATELET
Abs Immature Granulocytes: 0.03 K/uL (ref 0.00–0.07)
Basophils Absolute: 0 K/uL (ref 0.0–0.1)
Basophils Relative: 0 %
Eosinophils Absolute: 0.1 K/uL (ref 0.0–0.5)
Eosinophils Relative: 1 %
HCT: 34 % — ABNORMAL LOW (ref 36.0–46.0)
Hemoglobin: 11.2 g/dL — ABNORMAL LOW (ref 12.0–15.0)
Immature Granulocytes: 0 %
Lymphocytes Relative: 17 %
Lymphs Abs: 1.8 K/uL (ref 0.7–4.0)
MCH: 30.3 pg (ref 26.0–34.0)
MCHC: 32.9 g/dL (ref 30.0–36.0)
MCV: 91.9 fL (ref 80.0–100.0)
Monocytes Absolute: 1 K/uL (ref 0.1–1.0)
Monocytes Relative: 9 %
Neutro Abs: 7.5 K/uL (ref 1.7–7.7)
Neutrophils Relative %: 73 %
Platelets: 276 K/uL (ref 150–400)
RBC: 3.7 MIL/uL — ABNORMAL LOW (ref 3.87–5.11)
RDW: 14.2 % (ref 11.5–15.5)
WBC: 10.5 K/uL (ref 4.0–10.5)
nRBC: 0 % (ref 0.0–0.2)

## 2024-10-10 LAB — GLUCOSE, CAPILLARY
Glucose-Capillary: 100 mg/dL — ABNORMAL HIGH (ref 70–99)
Glucose-Capillary: 130 mg/dL — ABNORMAL HIGH (ref 70–99)
Glucose-Capillary: 197 mg/dL — ABNORMAL HIGH (ref 70–99)
Glucose-Capillary: 136 mg/dL — ABNORMAL HIGH (ref 70–99)

## 2024-10-10 LAB — MAGNESIUM: Magnesium: 1.8 mg/dL (ref 1.7–2.4)

## 2024-10-10 MED ORDER — CARVEDILOL 6.25 MG PO TABS
6.2500 mg | ORAL_TABLET | Freq: Two times a day (BID) | ORAL | Status: DC
  Administered 2024-10-11 – 2024-10-16 (×11): 6.25 mg via ORAL
  Filled 2024-10-10 (×9): qty 1

## 2024-10-10 NOTE — Progress Notes (Signed)
 Orthopedic Tech Progress Note Patient Details:  Linda Washington [birth year] [id number]  RN reached out stating  patient is having a lot of discomfort in leg, believe her blisters may have burst went and unwrapped leg and had RN take a picture of blisters and was going to reach out to [facility] to see if they wanted patient in a boot so they could monitor her blisters  Patient ID: Linda Washington, female   DOB: Mar 08, 1951, 73 y.o.   MRN: [id number]  [name] Pac 10/10/2024, 4:16 PM

## 2024-10-10 NOTE — TOC CM/SW Note (Signed)
 Transition of Care [facility]) - Inpatient Brief Assessment   Patient Details  Name: Linda Washington MRN: [id number] Date of Birth: 1951/03/14  Transition of Care [facility]) CM/SW Contact:    Waddell Barnie Rama, RN Phone Number: 10/10/2024, 8:38 AM   Clinical Narrative: From home with son, has PCP and insurance on file, states has no HH services in place at this time , has cane and walker at home.  States family member (son)  will transport them home at [employer] and family is support system, states gets medications from [facility] on [address]  Pta self ambulatory with cane/walker.   Per PT eval rec SNF, she is agreeable for [facility] to fax information out  but [facility] is her preferred SNF.       Transition of Care Asessment: Insurance and Status: Insurance coverage has been reviewed Patient has primary care physician: Yes Home environment has been reviewed: home with son Prior level of function:: ambulatory with walker/cane Prior/Current Home Services: No current home services Social Drivers of Health Review: SDOH reviewed no interventions necessary Readmission risk has been reviewed: Yes Transition of care needs: transition of care needs identified, TOC will continue to follow

## 2024-10-10 NOTE — NC FL2 (Signed)
 " [place]  MEDICAID FL2 LEVEL OF CARE FORM     IDENTIFICATION  Patient Name: Linda Washington Birthdate: [birth year] Sex: female Admission Date (Current Location): 10/08/2024  [facility] and [place] Number:  Producer, Television/film/video and Address:  [facility]. [facility], [address] [place], [place] [address]      Provider Number: [id number]  Attending Physician Name and Address:  Sebastian Toribio GAILS, MD  Relative Name and Phone Number:  Sloan Lynwood Blades, Emergency Contact  [phone] (Mobile)    Current Level of Care: Hospital Recommended Level of Care: Skilled Nursing Facility Prior Approval Number:    Date Approved/Denied:   PASRR Number: [id number]  Discharge Plan: SNF    Current Diagnoses: Patient Active Problem List   Diagnosis Date Noted   Anaphylaxis 10/08/2024   Hypercoagulable state due to atypical atrial flutter (HCC) 07/20/2023   Bradycardia 05/24/2023   Chronic diastolic heart failure (HCC) 04/27/2022   Hemothorax on left 09/04/2021   Hemothorax 09/03/2021   Rib fracture 09/03/2021   Abnormal gait 12/25/2020   Acquired hammer toe of left foot 12/25/2020   Acquired hypothyroidism 12/25/2020   Chronic anticoagulation 12/25/2020   Long term (current) use of anticoagulants 12/25/2020   Chronic pain 12/25/2020   Constipation 12/25/2020   Dysphagia 12/25/2020   Esophageal dysmotility 12/25/2020   Gastroesophageal reflux disease 12/25/2020   History of malignant neoplasm of skin 12/25/2020   Lumbosacral spondylosis without myelopathy 12/25/2020   Mixed anxiety and depressive disorder 12/25/2020   Morbid obesity (HCC) 12/25/2020   Overactive bladder 12/25/2020   Parageusia 12/25/2020   History of colonic polyps 12/25/2020   Polyneuropathy due to type 2 diabetes mellitus (HCC) 12/25/2020   Pure hypercholesterolemia 12/25/2020   Ventral hernia without obstruction or gangrene 12/25/2020   Vitamin D deficiency 12/25/2020   Porokeratosis  09/17/2020   Encounter for therapeutic drug monitoring 04/09/2020   Essential hypertension 06/20/2018   Dyslipidemia 06/20/2018   OSA (obstructive sleep apnea) 06/20/2018   Atrial flutter (HCC) 06/20/2018   Right-sided epistaxis 04/28/2018   Unstable angina (HCC) 02/01/2017   Diabetes mellitus (HCC) 02/01/2017   Arthritis of knee 04/08/2015   Primary osteoarthritis of left knee 04/07/2015    Orientation RESPIRATION BLADDER Height & Weight     Self, Time, Situation, Place  Normal Continent, External catheter Weight: 268 lb 11.9 oz (121.9 kg) Height:  5' 6 (167.6 cm)  BEHAVIORAL SYMPTOMS/MOOD NEUROLOGICAL BOWEL NUTRITION STATUS      Continent Diet (Please see discharge summary)  AMBULATORY STATUS COMMUNICATION OF NEEDS Skin   Extensive Assist Verbally PU Stage and Appropriate Care, Other (Comment) (Pressure Injury Sacrum Mid Deep Tissue Pressure Injury; Surgical Laparoscopic Abdomen Other (Comment) Right;Left;Mid; Left ankle)                       Personal Care Assistance Level of Assistance  Bathing, Feeding, Dressing Bathing Assistance: Maximum assistance Feeding assistance: Limited assistance Dressing Assistance: Maximum assistance     Functional Limitations Info  Sight, Hearing, Speech Sight Info: Adequate Hearing Info: Adequate Speech Info: Adequate    SPECIAL CARE FACTORS FREQUENCY  PT (By licensed PT), OT (By licensed OT)     PT Frequency: 5x week OT Frequency: 5x week            Contractures Contractures Info: Not present    Additional Factors Info  Code Status, Allergies, Insulin  Sliding Scale Code Status Info: Full Allergies Info: Bovine (Beef) Protein-containing Drug Products, Other, Cyclobenzaprine, Gabapentin ,  Hydrocodone, Methocarbamol , Oxycodone, Tinidazole, Latex   Insulin  Sliding Scale Info: Please see discharge summary       Current Medications (10/10/2024):  This is the current hospital active medication list Current  Facility-Administered Medications  Medication Dose Route Frequency Provider Last Rate Last Admin   acetaminophen  (TYLENOL ) tablet 650 mg  650 mg Oral Q8H PRN Claudene Toribio BROCKS, MD   650 mg at 10/09/24 1211   amiodarone  (PACERONE ) tablet 200 mg  200 mg Oral Daily Claudene Toribio BROCKS, MD   200 mg at 10/09/24 1008   apixaban  (ELIQUIS ) tablet 5 mg  5 mg Oral BID Claudene Toribio BROCKS, MD   5 mg at 10/09/24 2223   artificial tears ophthalmic solution   Both Eyes Daily PRN Claudene Toribio BROCKS, MD       atorvastatin  (LIPITOR) tablet 40 mg  40 mg Oral QHS Claudene Toribio BROCKS, MD   40 mg at 10/09/24 2223   Chlorhexidine  Gluconate Cloth 2 % PADS 6 each  6 each Topical Daily Claudene Toribio BROCKS, MD   6 each at 10/09/24 1130   cyanocobalamin  (VITAMIN B12) tablet 1,000 mcg  1,000 mcg Oral Daily Claudene Toribio BROCKS, MD   1,000 mcg at 10/09/24 1008   diphenhydrAMINE  (BENADRYL ) capsule 25 mg  25 mg Oral Q6H Claudene Toribio BROCKS, MD   25 mg at 10/10/24 9362   docusate sodium  (COLACE) capsule 100 mg  100 mg Oral BID PRN Smith, Daniel C, MD       DULoxetine  (CYMBALTA ) DR capsule 30 mg  30 mg Oral Daily Claudene Toribio BROCKS, MD   30 mg at 10/09/24 1008   famotidine  (PEPCID ) tablet 20 mg  20 mg Oral BID Simpson, Paula B, NP   20 mg at 10/09/24 2223   fluticasone  (FLONASE ) 50 MCG/ACT nasal spray 1-2 spray  1-2 spray Each Nare Daily PRN Claudene Toribio BROCKS, MD       ibuprofen  (ADVIL ) tablet 800 mg  800 mg Oral Q8H PRN Claudene Toribio BROCKS, MD   800 mg at 10/09/24 1719   insulin  aspart (novoLOG ) injection 0-15 Units  0-15 Units Subcutaneous TID WC Claudene Toribio BROCKS, MD   2 Units at 10/09/24 1714   insulin  aspart (novoLOG ) injection 0-5 Units  0-5 Units Subcutaneous QHS Claudene Toribio BROCKS, MD   2 Units at 10/08/24 2121   oxybutynin  (DITROPAN -XL) 24 hr tablet 15 mg  15 mg Oral Daily Claudene Toribio BROCKS, MD   15 mg at 10/09/24 1007   polyethylene glycol (MIRALAX  / GLYCOLAX ) packet 17 g  17 g Oral Daily PRN Smith, Daniel C, MD       predniSONE  (DELTASONE ) tablet 40 mg  40 mg Oral  Q breakfast Simpson, Paula B, NP         Discharge Medications: Please see discharge summary for a list of discharge medications.  Relevant Imaging Results:  Relevant Lab Results:   Additional Information SSN [id number]  Luise BROCKS Pan, LCSWA     "

## 2024-10-10 NOTE — Progress Notes (Signed)
 " PROGRESS NOTE    Linda Washington  FMW:[id number] DOB: 23-Aug-1951 DOA: 10/08/2024 PCP: Katina Pfeiffer, PA-C    Chief Complaint  Patient presents with   Allergic Reaction    Brief Narrative:  Patient 73 year old female history of anxiety, GERD, history of allergies and previous anaphylactic reactions, who presented with hives, swelling, respiratory distress followed by witnessed syncope after eating pancakes and jelly.  Patient received epi, Benadryl .  Patient with low BP subsequently admitted to [facility].  Patient placed on epi gtt., Benadryl , Pepcid , steroids with clinical improvement.  Patient also noted to have a left displaced fibular fracture and seen in consultation by orthopedics who recommended splint placement, nonweightbearing with outpatient follow-up.   Assessment & Plan:   Principal Problem:   Anaphylaxis Active Problems:   Diabetes mellitus (HCC)   Essential hypertension   Dyslipidemia   OSA (obstructive sleep apnea)   Atrial flutter (HCC)   Acquired hypothyroidism   Chronic anticoagulation   Gastroesophageal reflux disease   Mixed anxiety and depressive disorder   Tibia/fibula fracture, left, closed, initial encounter   AKI (acute kidney injury)  #1 anaphylactic shock, presumably secondary to grape jelly -Patient with clinical improvement.   -Patient noted to have been on the epi drip which has subsequently been discontinued. - Patient also on Benadryl  and Pepcid  and prednisone . - Continue Benadryl  through today, 5 days of H2 blocker with Pepcid  and 5 days of prednisone  as recommended by PCCM/pulmonary. -CT head negative for any acute abnormalities. - Will need outpatient evaluation by allergist.  2.  Left fibular fracture -Secondary to syncope/fall secondary to problem #1. - Imaging done on admission consistent with left fibular fracture. - Patient seen and consulted by orthopedics who recommended ORIF to be done in the outpatient setting. - Orthopedics  recommended splint placement with nonweightbearing on left lower extremity with outpatient follow-up with Dr. Elsa orthopedics - Will place on scheduled Tylenol .  Continue ibuprofen  as needed. - Per orthopedics.  3.  GERD -PPI.  4.  Insulin -dependent diabetes mellitus -Hemoglobin A1c noted at 6.8 (09/29/2024) - Patient noted to be on Trulicity 1.5 mg q. weekly, Jardiance  25 mg daily, Lantus 10 units daily, metformin  1000 mg twice daily. - CBG noted at 100 this morning. - Continue SSI.  5.  Hypertension/hyperlipidemia - Continue statin. - Resume home regimen Coreg .  6.  SSS status post PPM/A-fib on anticoagulation -Continue normal sinus rhythm. - Continue amiodarone . - Resume home regimen Coreg  for rate control. - Eliquis  for anticoagulation.  7.  Hypothyroidism -TSH noted at 1.550 (07/03/2024). - Patient not on any medication prior to admission. - Outpatient follow-up.  8 AKI  - Renal function improved.  9.  Sacral MASD versus pressure injury, POA Patient seen by wound care RN. Wound 10/08/24 1858 Pressure Injury Sacrum Mid Deep Tissue Pressure Injury - Purple or maroon localized area of discolored intact skin or blood-filled blister due to damage of underlying soft tissue from pressure and/or shear. (Active)         DVT prophylaxis: Eliquis  Code Status: Full Family Communication: Updated patient, no family at bedside. Disposition: SNF.  Status is: Inpatient Remains inpatient appropriate because: Severely elevated   Consultants:  Admitted to PCCM Orthopedics: Dr.Marchwiany 10/09/2024 Wound care RN 10/08/2024  Procedures:  CT head 10/09/2024 Plain films of the left ankle 10/09/2024 Chest x-ray 10/08/2024 CT angiogram bilateral lower extremities 10/08/2024  Antimicrobials:  Anti-infectives (From admission, onward)    None         Subjective: Lying in bed  on the cell phone.  Denies any chest pain or shortness of breath.  No difficulty swallowing.  No  abdominal pain.  Feels well.  Improvement with left ankle pain after splint was placed per patient.  Objective: Vitals:   10/10/24 0740 10/10/24 1140 10/10/24 1520 10/10/24 1525  BP: (!) 119/56 (!) 142/56  (!) 144/67  Pulse: 77 74 75 75  Resp: 18 20 18 20   Temp: 97.6 F (36.4 C) 98 F (36.7 C) 98 F (36.7 C)   TempSrc: Oral Oral Oral   SpO2: 97% 97% 93% 94%  Weight:      Height:        Intake/Output Summary (Last 24 hours) at 10/10/2024 1820 Last data filed at 10/10/2024 1700 Gross per 24 hour  Intake 480 ml  Output 400 ml  Net 80 ml   Filed Weights   10/08/24 1425 10/08/24 1800 10/10/24 0300  Weight: 123 kg 121.6 kg 121.9 kg    Examination:  General exam: Appears calm and comfortable  Respiratory system: Clear to auscultation.  No wheezes, no crackles, no rhonchi.  Fair air movement.  Speaking in full sentences.  Respiratory effort normal. Cardiovascular system: S1 & S2 heard, RRR. No JVD, murmurs, rubs, gallops or clicks. No pedal edema. Gastrointestinal system: Abdomen is nondistended, soft and nontender. No organomegaly or masses felt. Normal bowel sounds heard. Central nervous system: Alert and oriented. No focal neurological deficits. Extremities: Left lower extremity in splint.  Skin: No rashes, lesions or ulcers Psychiatry: Judgement and insight appear normal. Mood & affect appropriate.     Data Reviewed: I have personally reviewed following labs and imaging studies  CBC: Recent Labs  Lab 10/08/24 1438 10/09/24 0252 10/10/24 1028  WBC 8.8 21.4* 10.5  NEUTROABS 6.5  --  7.5  HGB 15.8* 14.1 11.2*  HCT 50.5* 42.8 34.0*  MCV 96.4 92.8 91.9  PLT 337 293 276    Basic Metabolic Panel: Recent Labs  Lab 10/08/24 1844 10/09/24 0252 10/10/24 1028  NA 141 136 140  K 3.9 4.7 3.8  CL 105 103 107  CO2 17* 17* 24  GLUCOSE 303* 163* 117*  BUN 18 19 18   CREATININE 1.23* 1.06* 0.81  CALCIUM  9.2 8.7* 8.4*  MG  --  1.7 1.8  PHOS  --  4.3  --      GFR: Estimated Creatinine Clearance: 79.9 mL/min (by C-G formula based on SCr of 0.81 mg/dL).  Liver Function Tests: No results for input(s): AST, ALT, ALKPHOS, BILITOT, PROT, ALBUMIN in the last 168 hours.  CBG: Recent Labs  Lab 10/09/24 1713 10/09/24 2229 10/10/24 0604 10/10/24 1139 10/10/24 1520  GLUCAP 122* 122* 100* 130* 197*     Recent Results (from the past 240 hours)  MRSA Next Gen by PCR, Nasal     Status: None   Collection Time: 10/08/24  6:42 PM   Specimen: Nasal Mucosa; Nasal Swab  Result Value Ref Range Status   MRSA by PCR Next Gen NOT DETECTED NOT DETECTED Final    Comment: (NOTE) The GeneXpert MRSA Assay (FDA approved for NASAL specimens only), is one component of a comprehensive MRSA colonization surveillance program. It is not intended to diagnose MRSA infection nor to guide or monitor treatment for MRSA infections. Test performance is not FDA approved in patients less than 39 years old. Performed at [facility], [address], [place], [place] [address]          Radiology Studies: CT HEAD WO CONTRAST ( )  Result Date: 10/09/2024 EXAM: CT HEAD WITHOUT CONTRAST 10/09/2024 12:03:19 PM TECHNIQUE: CT of the head was performed without the administration of intravenous contrast. Automated exposure control, iterative reconstruction, and/or weight based adjustment of the mA/kV was utilized to reduce the radiation dose to as low as reasonably achievable. COMPARISON: CT of the head dated 09/03/2021. CLINICAL HISTORY: fall on eliquis  FINDINGS: BRAIN AND VENTRICLES: No acute hemorrhage. 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 No evidence of acute infarct. No hydrocephalus. No extra-axial collection. No mass effect or midline shift. There is a partially empty sella. There is calcific atheromatous disease within the carotid siphons. ORBITS: The patient is status post bilateral lens replacement. No acute abnormality. SINUSES: No acute  abnormality. SOFT TISSUES AND SKULL: No acute soft tissue abnormality. Hyperostosis frontalis interna. No skull fracture. IMPRESSION: 1. No acute intracranial abnormality related to the fall. 2. 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 3. 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 4. Calcific atheromatous disease within the carotid siphons. Electronically signed by: Evalene Coho MD 10/09/2024 12:20 PM EST RP Workstation: [id number]   DG Ankle Complete Left Result Date: 10/09/2024 EXAM: 3 OR MORE VIEW(S) XRAY OF THE LEFT ANKLE 10/09/2024 02:03:00 AM CLINICAL HISTORY: Fracture [id number] COMPARISON: None available. FINDINGS: BONES AND JOINTS: Acute oblique fracture of the lateral malleolus with mild lateral displacement and slight lateral talar shift. The fracture line extends into the tibiofibular syndesmosis at the level of the ankle mortise. Plantar and dorsal calcaneal spurs. SOFT TISSUES: Diffuse subcutaneous edema. IMPRESSION: 1. Acute oblique fracture of the lateral malleolus. Electronically signed by: Norman Gatlin MD 10/09/2024 02:13 AM EST RP Workstation: [id number]   CT ANGIO LOWER EXT BILAT W &/OR WO CONTRAST Result Date: 10/08/2024 CLINICAL DATA:  Peripheral edema with increased blistering and bruising, initial encounter EXAM: CT ANGIOGRAPHY OF ABDOMINAL AORTA WITH ILIOFEMORAL RUNOFF TECHNIQUE: Multidetector CT imaging of the abdomen, pelvis and lower extremities was performed using the standard protocol during bolus administration of intravenous contrast. Multiplanar CT image reconstructions and MIPs were obtained to evaluate the vascular anatomy. RADIATION DOSE REDUCTION: This exam was performed according to the departmental dose-optimization program which includes automated exposure control, adjustment of the mA and/or kV according to patient size and/or use of iterative reconstruction technique. CONTRAST:  75mL OMNIPAQUE  IOHEXOL  350 MG/ML SOLN COMPARISON:  None Available.  FINDINGS: VASCULAR Aorta: Infrarenal aorta is well visualized. No aneurysmal dilatation or dissection is noted. Celiac: Not included on this exam SMA: Not included on this exam Renals: Not included on this exam IMA: Widely patent RIGHT Lower Extremity Inflow: Right common iliac artery shows no significant atherosclerotic calcifications. Bifurcation into the external and internal iliac arteries is seen. Runoff: Common femoral artery and femoral bifurcation shows no focal abnormality. Superficial femoral artery is widely patent. Popliteal artery and popliteal trifurcation are well visualized. Three-vessel runoff to the right ankle is noted. The anterior tibial and posterior tibial arteries continue into foot with the posterior tibial artery being the dominant vessel. LEFT Lower Extremity Inflow: Left common iliac artery is widely patent as is the iliac bifurcation. Runoff: Common femoral artery and femoral bifurcation are widely patent. The superficial femoral artery is widely patent without focal stenosis. The popliteal artery is also widely patent. A portion of the popliteal artery is obscured due to the knee replacement hardware although the distal popliteal artery is within normal limits consistent with patency. The popliteal trifurcation is within normal limits. Three-vessel runoff to the left ankle is noted with the posterior tibial artery being  the dominant runoff vessel. Veins: No specific venous abnormality is noted. Review of the MIP images confirms the above findings. NON-VASCULAR Lower chest: Not included on this exam. Hepatobiliary: The liver tip is visualized and within normal limits. Pancreas: Not included on this exam. Spleen: Not included on this exam. Adrenals/Urinary Tract: Adrenal glands are not included. The lower poles of the kidneys show no acute abnormality. No obstructive changes are seen. The bladder is partially distended. Stomach/Bowel: Portions of the large and small bowel are visualized.  The appendix is not well visualized. No obstructive or inflammatory changes of the visualized bowel is seen. Lymphatic: No lymphadenopathy is noted. Reproductive: Uterus has been surgically removed. Other: No free pelvic fluid is noted. Musculoskeletal: Osseous structures show degenerative change of the lumbar spine. The lower extremities demonstrate evidence of a left knee prosthesis in satisfactory position. Comminuted fracture of the distal fibula involving the lateral malleolus is seen. Mild displacement at the fracture site is noted. Surrounding soft tissue swelling is noted. The tibia and proximal fibula appear within normal limits. IMPRESSION: VASCULAR No acute vascular abnormality is noted. Three-vessel runoff to the ankles is noted bilaterally. NON-VASCULAR No acute visceral abnormality is noted. Comminuted fracture of the distal left fibula is noted which would account for the soft tissue swelling and bruising. No focal hematoma is noted. Electronically Signed   By: Oneil Devonshire M.D.   On: 10/08/2024 23:14        Scheduled Meds:  amiodarone   200 mg Oral Daily   apixaban   5 mg Oral BID   atorvastatin   40 mg Oral QHS   [START ON 10/11/2024] carvedilol   6.25 mg Oral BID WC   Chlorhexidine  Gluconate Cloth  6 each Topical Daily   cyanocobalamin   1,000 mcg Oral Daily   DULoxetine   30 mg Oral Daily   famotidine   20 mg Oral BID   insulin  aspart  0-15 Units Subcutaneous TID WC   insulin  aspart  0-5 Units Subcutaneous QHS   oxybutynin   15 mg Oral Daily   predniSONE   40 mg Oral Q breakfast   Continuous Infusions:   LOS: 2 days    Time spent: 40 minutes    Toribio Hummer, MD [facility]   To contact the attending provider between 7A-7P or the covering provider during after hours 7P-7A, please log into the web site [email or web address] and access using universal [place] password for that web site. If you do not have the password, please call the hospital operator.  10/10/2024,  6:20 PM    "

## 2024-10-10 NOTE — TOC Initial Note (Addendum)
 Transition of Care [facility]) - Initial/Assessment Note    Patient Details  Name: Linda Washington MRN: [id number] Date of Birth: [birth year]  Transition of Care [facility]) CM/SW Contact:    Luise JAYSON Pan, LCSWA Phone Number: 10/10/2024, 8:44 AM  Clinical Narrative:       CSW received notice from [facility] that patient is agreeable to SNF and prefers [facility] on [address]. CSW to fax patient out and follow up with bed offers.  12:05 PM CSW provided patient with medicare.gov ratings for accepting bed offers. Patient stated if she cannot go to [facility] her backup  choices are [facility]nd [facility], 3rd [facility] SNF.   CSW received notice from [facility] that they are extending a bed offer to patient. CSW to start auth.  CSW will continue to follow.            Expected Discharge Plan: Skilled Nursing Facility Barriers to Discharge: Continued Medical Work up, SNF Pending bed offer, Insurance Authorization   Patient Goals and CMS Choice Patient states their goals for this hospitalization and ongoing recovery are:: To go to rehab CMS Medicare.gov Compare Post Acute Care list provided to:: Patient Choice offered to / list presented to : Patient [place] ownership interest in [facility].provided to:: Patient    Expected Discharge Plan and Services In-house Referral: Clinical Social Work Discharge Planning Services: CM Consult Post Acute Care Choice: Skilled Nursing Facility Living arrangements for the past 2 months: Single Family Home                                      Prior Living Arrangements/Services Living arrangements for the past 2 months: Single Family Home Lives with:: Adult Children Patient language and need for interpreter reviewed:: Yes Do you feel safe going back to the place where you live?: Yes      Need for Family Participation in Patient Care: Yes (Comment) Care giver support system in place?: Yes (comment) Current home services: DME Criminal  Activity/Legal Involvement Pertinent to Current Situation/Hospitalization: No - Comment as needed  Activities of Daily Living   ADL Screening (condition at time of admission) Independently performs ADLs?: Yes (appropriate for developmental age) Is the patient deaf or have difficulty hearing?: No Does the patient have difficulty seeing, even when wearing glasses/contacts?: No Does the patient have difficulty concentrating, remembering, or making decisions?: No  Permission Sought/Granted Permission sought to share information with : Facility Medical Sales Representative, Family Supports Permission granted to share information with : Yes, Verbal Permission Granted  Share Information with NAME: Hedgepegh,James (Son) [phone]  Permission granted to share info w AGENCY: SNFs        Emotional Assessment Appearance:: Appears stated age Attitude/Demeanor/Rapport: Engaged Affect (typically observed): Stable Orientation: : Oriented to Situation, Oriented to  Time, Oriented to Place, Oriented to Self Alcohol  / Substance Use: Not Applicable Psych Involvement: No (comment)  Admission diagnosis:  Anaphylaxis [T78.2XXA] Anaphylaxis, initial encounter [T78.2XXA] Capsulitis of right ankle [M77.51] Atrial flutter, unspecified type [facility]) [I48.92] Patient Active Problem List   Diagnosis Date Noted   Anaphylaxis 10/08/2024   Hypercoagulable state due to atypical atrial flutter (HCC) 07/20/2023   Bradycardia 05/24/2023   Chronic diastolic heart failure (HCC) 04/27/2022   Hemothorax on left 09/04/2021   Hemothorax 09/03/2021   Rib fracture 09/03/2021   Abnormal gait 12/25/2020   Acquired hammer toe of left foot 12/25/2020   Acquired hypothyroidism 12/25/2020  Chronic anticoagulation 12/25/2020   Long term (current) use of anticoagulants 12/25/2020   Chronic pain 12/25/2020   Constipation 12/25/2020   Dysphagia 12/25/2020   Esophageal dysmotility 12/25/2020   Gastroesophageal reflux disease  12/25/2020   History of malignant neoplasm of skin 12/25/2020   Lumbosacral spondylosis without myelopathy 12/25/2020   Mixed anxiety and depressive disorder 12/25/2020   Morbid obesity (HCC) 12/25/2020   Overactive bladder 12/25/2020   Parageusia 12/25/2020   History of colonic polyps 12/25/2020   Polyneuropathy due to type 2 diabetes mellitus (HCC) 12/25/2020   Pure hypercholesterolemia 12/25/2020   Ventral hernia without obstruction or gangrene 12/25/2020   Vitamin D deficiency 12/25/2020   Porokeratosis 09/17/2020   Encounter for therapeutic drug monitoring 04/09/2020   Essential hypertension 06/20/2018   Dyslipidemia 06/20/2018   OSA (obstructive sleep apnea) 06/20/2018   Atrial flutter (HCC) 06/20/2018   Right-sided epistaxis 04/28/2018   Unstable angina (HCC) 02/01/2017   Diabetes mellitus (HCC) 02/01/2017   Arthritis of knee 04/08/2015   Primary osteoarthritis of left knee 04/07/2015   PCP:  Katina Pfeiffer, PA-C Pharmacy:   [facility] [phone] [address], [place] - [address] [place] [address] Phone: [phone] Fax: [phone]  [facility] - [place], [place] - [address] [place] [address] Phone: [phone] Fax: [phone]     Social Drivers of Health (SDOH) Social History: SDOH Screenings   Food Insecurity: No Food Insecurity (10/09/2024)  Housing: Low Risk (10/09/2024)  Transportation Needs: No Transportation Needs (10/09/2024)  Utilities: Not At Risk (10/09/2024)  Social Connections: Moderately Integrated (10/09/2024)  Tobacco Use: Low Risk (10/09/2024)   SDOH Interventions:     Readmission Risk Interventions    10/10/2024    8:37 AM  Readmission Risk Prevention Plan  Post Dischage Appt Complete  Medication Screening Complete  Transportation Screening Complete

## 2024-10-11 DIAGNOSIS — Z794 Long term (current) use of insulin: Secondary | ICD-10-CM

## 2024-10-11 DIAGNOSIS — T7804XA Anaphylactic reaction due to fruits and vegetables, initial encounter: Secondary | ICD-10-CM

## 2024-10-11 DIAGNOSIS — G4733 Obstructive sleep apnea (adult) (pediatric): Secondary | ICD-10-CM

## 2024-10-11 DIAGNOSIS — N179 Acute kidney failure, unspecified: Secondary | ICD-10-CM | POA: Diagnosis not present

## 2024-10-11 DIAGNOSIS — S82202A Unspecified fracture of shaft of left tibia, initial encounter for closed fracture: Secondary | ICD-10-CM | POA: Diagnosis not present

## 2024-10-11 DIAGNOSIS — E1165 Type 2 diabetes mellitus with hyperglycemia: Secondary | ICD-10-CM | POA: Diagnosis not present

## 2024-10-11 DIAGNOSIS — I10 Essential (primary) hypertension: Secondary | ICD-10-CM | POA: Diagnosis not present

## 2024-10-11 DIAGNOSIS — F418 Other specified anxiety disorders: Secondary | ICD-10-CM | POA: Diagnosis not present

## 2024-10-11 DIAGNOSIS — K219 Gastro-esophageal reflux disease without esophagitis: Secondary | ICD-10-CM

## 2024-10-11 DIAGNOSIS — E785 Hyperlipidemia, unspecified: Secondary | ICD-10-CM

## 2024-10-11 DIAGNOSIS — S82402A Unspecified fracture of shaft of left fibula, initial encounter for closed fracture: Secondary | ICD-10-CM

## 2024-10-11 DIAGNOSIS — Z7901 Long term (current) use of anticoagulants: Secondary | ICD-10-CM | POA: Diagnosis not present

## 2024-10-11 DIAGNOSIS — I4892 Unspecified atrial flutter: Secondary | ICD-10-CM

## 2024-10-11 LAB — BASIC METABOLIC PANEL WITH GFR
Anion gap: 9 (ref 5–15)
BUN: 20 mg/dL (ref 8–23)
CO2: 24 mmol/L (ref 22–32)
Calcium: 8.7 mg/dL — ABNORMAL LOW (ref 8.9–10.3)
Chloride: 106 mmol/L (ref 98–111)
Creatinine, Ser: 0.79 mg/dL (ref 0.44–1.00)
GFR, Estimated: >60 mL/min
Glucose, Bld: 115 mg/dL — ABNORMAL HIGH (ref 70–99)
Potassium: 3.8 mmol/L (ref 3.5–5.1)
Sodium: 139 mmol/L (ref 135–145)

## 2024-10-11 LAB — GLUCOSE, CAPILLARY
Glucose-Capillary: 98 mg/dL (ref 70–99)
Glucose-Capillary: 153 mg/dL — ABNORMAL HIGH (ref 70–99)
Glucose-Capillary: 198 mg/dL — ABNORMAL HIGH (ref 70–99)
Glucose-Capillary: 202 mg/dL — ABNORMAL HIGH (ref 70–99)
Glucose-Capillary: 145 mg/dL — ABNORMAL HIGH (ref 70–99)

## 2024-10-11 LAB — CBC
HCT: 34.6 % — ABNORMAL LOW (ref 36.0–46.0)
Hemoglobin: 11.7 g/dL — ABNORMAL LOW (ref 12.0–15.0)
MCH: 30.7 pg (ref 26.0–34.0)
MCHC: 33.8 g/dL (ref 30.0–36.0)
MCV: 90.8 fL (ref 80.0–100.0)
Platelets: 286 K/uL (ref 150–400)
RBC: 3.81 MIL/uL — ABNORMAL LOW (ref 3.87–5.11)
RDW: 13.8 % (ref 11.5–15.5)
WBC: 10.4 K/uL (ref 4.0–10.5)
nRBC: 0 % (ref 0.0–0.2)

## 2024-10-11 NOTE — Progress Notes (Signed)
 Physical Therapy Treatment Patient Details Name: Linda Washington MRN: [id number] DOB: 04-25-1951 Today's Date: 10/11/2024   History of Present Illness 73 yo F adm 10/08/24 with hives, edema, anaphylaxis, syncope after eating pancakes, hypotension and Lt ankle fx. PMH: 10/02/24 lap chole with hernia repair. DM, HLD, Aflutter on Coumadin , HTN, CHF, neuropathy, sleep apnea, [place] , anxiety, depression    PT Comments  The pt is demonstrating good functional progress as she was able to transition supine to sit L EOB using bed features with only supervision this date. She continues to need the EOB or commode elevated in order for her to successfully transfer to stand though, suggesting R leg muscular strength/power deficits and balance deficits. Her recliner seat was built up by stacks of folded blankets to make it easier to exit the seat with nursing later. She was able to transfer to stand and stand/hop pivot with UE support and minA this date. When hopping a few feet to transfer from commode to the recliner, her HR elevated up to 148 bpm but quickly recovered once sitting. She continues to display difficulty with maintaining L leg NWB precautions with transfers, thereby requiring assistance to keep it off the ground. She did well with a gait belt looped around her L leg and the anterior bar of the RW to hold it off the ground with standing mobility. Will continue to follow acutely.    If plan is discharge home, recommend the following: A lot of help with walking and/or transfers;A lot of help with bathing/dressing/bathroom;Assistance with cooking/housework;Assist for transportation;Help with stairs or ramp for entrance   Can travel by private vehicle     No  Equipment Recommendations  Wheelchair cushion (measurements PT);Wheelchair (measurements PT);BSC/3in1    Recommendations for Other Services OT consult     Precautions / Restrictions Precautions Precautions: Fall;Other (comment) Recall of  Precautions/Restrictions: Intact Precaution/Restrictions Comments: watch HR Restrictions Weight Bearing Restrictions Per Provider Order: Yes LLE Weight Bearing Per Provider Order: Non weight bearing     Mobility  Bed Mobility Overal bed mobility: Needs Assistance Bed Mobility: Supine to Sit     Supine to sit: Supervision, HOB elevated, Used rails     General bed mobility comments: Pt able to transition supine to sit R EOB using bed rails with supervision and cues not to push through L heel on bed to scoot buttocks.    Transfers Overall transfer level: Needs assistance Equipment used: Rolling walker (2 wheels), 1 person hand held assist Transfers: Sit to/from Stand, Bed to chair/wheelchair/BSC Sit to Stand: From elevated surface, Min assist Stand pivot transfers: Min assist Step pivot transfers: Min assist       General transfer comment: Pt unable to stand from low bed height despite cues to push up from bed or pull up on therapist and push through R leg, but pt able to stand from elevated EOB with anterior approach HHA with minA then stand pivot to L to commode with minA for balance. Pt able to stand from commode (at highest option) to RW with minA and step/hop pivot to L to recliner with minA for balance. L leg held off the ground throughout transfers by wrapping a gait belt around the leg and either around the therapist or front bar of the RW to hold the leg off the ground and ensure NWB compliance.    Ambulation/Gait Ambulation/Gait assistance: Min assist Gait Distance (Feet): 2 Feet Assistive device: Rolling walker (2 wheels) Gait Pattern/deviations:  (hop-to) Gait velocity: reduced Gait velocity interpretation: <  1.31 ft/sec, indicative of household ambulator   General Gait Details: L leg held off the ground to ensure NWB compliance by wrapping a gait belt around the L leg and the anterior bar of the RW. MinA needed to balance when hopping to L from commode to recliner.  Cues provided to push heavily through arms on RW to hop.   Stairs             Wheelchair Mobility     Tilt Bed    Modified Rankin (Stroke Patients Only)       Balance Overall balance assessment: Needs assistance Sitting-balance support: No upper extremity supported, Feet supported Sitting balance-Leahy Scale: Good Sitting balance - Comments: able to sit EOB statically with supervision and no LOB   Standing balance support: Bilateral upper extremity supported, During functional activity, Reliant on assistive device for balance Standing balance-Leahy Scale: Poor Standing balance comment: reliant on UE support and minA                            Communication Communication Communication: No apparent difficulties  Cognition Arousal: Alert Behavior During Therapy: WFL for tasks assessed/performed   PT - Cognitive impairments: No apparent impairments                         Following commands: Intact      Cueing Cueing Techniques: Verbal cues, Tactile cues, Visual cues, Gestural cues  Exercises General Exercises - Lower Extremity Hip ABduction/ADduction: AROM, Strengthening, Both, 10 reps, Seated Straight Leg Raises: AROM, Strengthening, Both, 10 reps, Seated    General Comments General comments (skin integrity, edema, etc.): HR 70s at rest, up to 148 bpm with hopping, quickly recovered with seated rest break      Pertinent Vitals/Pain Pain Assessment Pain Assessment: Faces Faces Pain Scale: Hurts little more Pain Location: L ankle Pain Descriptors / Indicators: Sore, Discomfort, Grimacing, Guarding Pain Intervention(s): Limited activity within patient's tolerance, Monitored during session, Repositioned    Home Living                          Prior Function            PT Goals (current goals can now be found in the care plan section) Acute Rehab PT Goals Patient Stated Goal: return to walking PT Goal Formulation: With  patient Time For Goal Achievement: 10/23/24 Potential to Achieve Goals: Fair Progress towards PT goals: Progressing toward goals    Frequency    Min 2X/week      PT Plan      Co-evaluation              AM-PAC PT 6 Clicks Mobility   Outcome Measure  Help needed turning from your back to your side while in a flat bed without using bedrails?: A Little Help needed moving from lying on your back to sitting on the side of a flat bed without using bedrails?: A Little Help needed moving to and from a bed to a chair (including a wheelchair)?: A Little Help needed standing up from a chair using your arms (e.g., wheelchair or bedside chair)?: A Little Help needed to walk in hospital room?: Total Help needed climbing 3-5 steps with a railing? : Total 6 Click Score: 14    End of Session Equipment Utilized During Treatment: Gait belt Activity Tolerance: Patient tolerated treatment well Patient left: with  call bell/phone within reach;in chair;with chair alarm set Nurse Communication: Mobility status;Precautions;Weight bearing status PT Visit Diagnosis: Other abnormalities of gait and mobility (R26.89);Difficulty in walking, not elsewhere classified (R26.2);Muscle weakness (generalized) (M62.81);Unsteadiness on feet (R26.81)     Time: 8471-8444 PT Time Calculation (min) (ACUTE ONLY): 27 min  Charges:    $Therapeutic Activity: 23-37 mins PT General Charges $$ ACUTE PT VISIT: 1 Visit                     Theo Ferretti, PT, DPT Acute Rehabilitation Services  Office: [phone]    Theo CHRISTELLA Ferretti 10/11/2024, 4:12 PM

## 2024-10-11 NOTE — Progress Notes (Signed)
 Mobility Specialist Progress Note:    10/11/24 1251  Mobility  Activity Pivoted/transferred to/from [facility]  Level of Assistance Contact guard assist, steadying assist  Assistive Device Other (Comment) (HHA)  Distance Ambulated (ft) 5 ft  Range of Motion/Exercises Active  Activity Response Tolerated fair  Mobility Referral Yes  Mobility visit 1 Mobility  Mobility Specialist Start Time (ACUTE ONLY) 1251  Mobility Specialist Stop Time (ACUTE ONLY) 1300  Mobility Specialist Time Calculation (min) (ACUTE ONLY) 9 min   Received pt requesting assistance back to bed from [facility]. No c/o any symptoms. Pt needing cueing for NWB. Returned pt to bed w/ all needs met.   Venetia Keel Mobility Specialist Please Neurosurgeon or Rehab Office at [phone]

## 2024-10-11 NOTE — Progress Notes (Signed)
 " PROGRESS NOTE    Linda Washington  FMW:[id number] DOB: 09/11/1951 DOA: 10/08/2024 PCP: Katina Pfeiffer, PA-C   Brief Narrative:  Patient 73 year old female history of anxiety, GERD, history of allergies and previous anaphylactic reactions, who presented with hives, swelling, respiratory distress followed by witnessed syncope after eating pancakes and jelly.  Patient received epi, Benadryl .  Patient with low BP subsequently admitted to [facility].  Patient placed on epi gtt., Benadryl , Pepcid , steroids with clinical improvement.  Patient also noted to have a left displaced fibular fracture and seen in consultation by orthopedics who recommended splint placement, nonweightbearing with outpatient follow-up.  Given new onset ambulatory dysfunction patient is currently awaiting placement at SNF, remains medically stable for discharge.     Assessment & Plan:   Principal Problem:   Anaphylaxis Active Problems:   Diabetes mellitus (HCC)   Essential hypertension   Dyslipidemia   OSA (obstructive sleep apnea)   Atrial flutter (HCC)   Acquired hypothyroidism   Chronic anticoagulation   Gastroesophageal reflux disease   Mixed anxiety and depressive disorder   Tibia/fibula fracture, left, closed, initial encounter   AKI (acute kidney injury)   Anaphylactic shock, presumably secondary to grape jelly, POA, resolved - Initially requiring epinephrine  drip now discontinued - Prednisone  course to complete 12/26, Pepcid  ongoing -Recommend further outpatient evaluation by allergist given other known comorbid allergic reactions.   Syncope secondary to above  Mechanical fall with trauma Left fibular fracture - Confirmed on imaging, orthopedic surgery recommending splint placement with nonweightbearing at this time with plan for outpatient follow-up with Dr. Elsa - No plans for intervention at this time   GERD -PPI ongoing   Insulin -dependent diabetes mellitus, well-controlled -Hemoglobin A1c  noted at 6.8 (09/29/2024) - Patient noted to be on Trulicity 1.5 mg q. weekly, Jardiance  25 mg daily, Lantus 10 units daily, metformin  1000 mg twice daily. - CBG noted at 100 this morning. - Continue SSI, hypoglycemic protocol   Hypertension/hyperlipidemia - Continue statin. - Resume home regimen Coreg .  Sick sinus syndrome status post PPM/A-fib on anticoagulation, stable - Well controlled on carvedilol /amiodarone , sinus rhythm - Apixaban  for anticoagulation   Reported hypothyroidism - TSH remains within normal limits per chart review going back 6 years - Patient is not on any supplementation, unclear if truly hypothyroid  AKI, resolved - Renal function improved.  Sacral moisture associated skin damage(MASD) versus pressure injury, POA Patient seen by wound care RN. Wound 10/08/24 1858 Pressure Injury Sacrum Mid Deep Tissue Pressure Injury - Purple or maroon localized area of discolored intact skin or blood-filled blister due to damage of underlying soft tissue from pressure and/or shear. (Active)    DVT prophylaxis: SCDs Start: 10/08/24 1521 apixaban  (ELIQUIS ) tablet 5 mg  Code Status:   Code Status: Full Code Family Communication: None present  Status is: Inpatient  Dispo: The patient is from: Home              Anticipated d/c is to: SNF              Anticipated d/c date is: Imminent              Patient currently is medically stable for discharge  Consultants:  Orthopedic surgery, PCCM  Procedures:  None  Antimicrobials:  None  Subjective: No acute issues or events overnight denies nausea vomiting diarrhea constipation headache fevers chills or chest pain.  Pain currently well-controlled, looking forward to physical therapy later this afternoon to attempt ambulation again.  Objective: Vitals:   10/10/24  1525 10/10/24 1927 10/10/24 2321 10/11/24 0409  BP: (!) 144/67 (!) 145/44 (!) 143/61 (!) 152/68  Pulse: 75 76 64 60  Resp: 20 20 19 19   Temp:  98.2 F (36.8  C) 98.3 F (36.8 C) 97.6 F (36.4 C)  TempSrc:  Oral Oral Oral  SpO2: 94% 94% 96% 97%  Weight:    130.4 kg  Height:        Intake/Output Summary (Last 24 hours) at 10/11/2024 0713 Last data filed at 10/10/2024 1934 Gross per 24 hour  Intake 480 ml  Output 900 ml  Net -420 ml   Filed Weights   10/08/24 1800 10/10/24 0300 10/11/24 0409  Weight: 121.6 kg 121.9 kg 130.4 kg    Examination:  General:  Pleasantly resting in bed, No acute distress. HEENT:  Normocephalic atraumatic.  Sclerae nonicteric, noninjected.  Extraocular movements intact bilaterally. Neck:  Without mass or deformity.  Trachea is midline. Lungs:  Clear to auscultate bilaterally without rhonchi, wheeze, or rales. Heart:  Regular rate and rhythm.  Without murmurs, rubs, or gallops. Abdomen:  Soft, nontender, nondistended.  Without guarding or rebound. Extremities: Left leg splint clean dry intact Skin:  Warm and dry, discoloration of the sacrum concerning for pressure injury  Data Reviewed: I have personally reviewed following labs and imaging studies  CBC: Recent Labs  Lab 10/08/24 1438 10/09/24 0252 10/10/24 1028 10/11/24 0257  WBC 8.8 21.4* 10.5 10.4  NEUTROABS 6.5  --  7.5  --   HGB 15.8* 14.1 11.2* 11.7*  HCT 50.5* 42.8 34.0* 34.6*  MCV 96.4 92.8 91.9 90.8  PLT 337 293 276 286   Basic Metabolic Panel: Recent Labs  Lab 10/08/24 1844 10/09/24 0252 10/10/24 1028 10/11/24 0257  NA 141 136 140 139  K 3.9 4.7 3.8 3.8  CL 105 103 107 106  CO2 17* 17* 24 24  GLUCOSE 303* 163* 117* 115*  BUN 18 19 18 20   CREATININE 1.23* 1.06* 0.81 0.79  CALCIUM  9.2 8.7* 8.4* 8.7*  MG  --  1.7 1.8  --   PHOS  --  4.3  --   --    GFR: Estimated Creatinine Clearance: 84.1 mL/min (by C-G formula based on SCr of 0.79 mg/dL).  Cardiac Enzymes: Recent Labs  Lab 10/08/24 1844  CKTOTAL 115   CBG: Recent Labs  Lab 10/10/24 0604 10/10/24 1139 10/10/24 1520 10/10/24 2245 10/11/24 0559  GLUCAP 100* 130*  197* 136* 98   Sepsis Labs: Recent Labs  Lab 10/08/24 1452  LATICACIDVEN 6.8*    Recent Results (from the past 240 hours)  MRSA Next Gen by PCR, Nasal     Status: None   Collection Time: 10/08/24  6:42 PM   Specimen: Nasal Mucosa; Nasal Swab  Result Value Ref Range Status   MRSA by PCR Next Gen NOT DETECTED NOT DETECTED Final    Comment: (NOTE) The GeneXpert MRSA Assay (FDA approved for NASAL specimens only), is one component of a comprehensive MRSA colonization surveillance program. It is not intended to diagnose MRSA infection nor to guide or monitor treatment for MRSA infections. Test performance is not FDA approved in patients less than 28 years old. Performed at [facility], [address] [place], [place] [address]          Radiology Studies: CT HEAD WO CONTRAST ( ) Result Date: 10/09/2024 EXAM: CT HEAD WITHOUT CONTRAST 10/09/2024 12:03:19 PM TECHNIQUE: CT of the head was performed without the administration of intravenous contrast. Automated exposure control,  iterative reconstruction, and/or weight based adjustment of the mA/kV was utilized to reduce the radiation dose to as low as reasonably achievable. COMPARISON: CT of the head dated 09/03/2021. CLINICAL HISTORY: fall on eliquis  FINDINGS: BRAIN AND VENTRICLES: No acute hemorrhage. 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 No evidence of acute infarct. No hydrocephalus. No extra-axial collection. No mass effect or midline shift. There is a partially empty sella. There is calcific atheromatous disease within the carotid siphons. ORBITS: The patient is status post bilateral lens replacement. No acute abnormality. SINUSES: No acute abnormality. SOFT TISSUES AND SKULL: No acute soft tissue abnormality. Hyperostosis frontalis interna. No skull fracture. IMPRESSION: 1. No acute intracranial abnormality related to the fall. 2. 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 3. 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 4. Calcific atheromatous disease within the carotid siphons. Electronically signed by: Timothy Berrigan MD 10/09/2024 12:20 PM EST RP Workstation: [id number]        Scheduled Meds:  amiodarone   200 mg Oral Daily   apixaban   5 mg Oral BID   atorvastatin   40 mg Oral QHS   carvedilol   6.25 mg Oral BID WC   Chlorhexidine  Gluconate Cloth  6 each Topical Daily   cyanocobalamin   1,000 mcg Oral Daily   DULoxetine   30 mg Oral Daily   famotidine   20 mg Oral BID   insulin  aspart  0-15 Units Subcutaneous TID WC   insulin  aspart  0-5 Units Subcutaneous QHS   oxybutynin   15 mg Oral Daily   predniSONE   40 mg Oral Q breakfast   Continuous Infusions:   LOS: 3 days   Time spent:  Elsie JAYSON [place], DO [facility]  If 7PM-7AM, please contact night-coverage [email or web address]  10/11/2024, 7:13 AM      "

## 2024-10-11 NOTE — Plan of Care (Signed)
  Problem: Skin Integrity: Goal: Risk for impaired skin integrity will decrease Outcome: Progressing   Problem: Education: Goal: Knowledge of General Education information will improve Description: Including pain rating scale, medication(s)/side effects and non-pharmacologic comfort measures Outcome: Progressing

## 2024-10-12 DIAGNOSIS — T7804XA Anaphylactic reaction due to fruits and vegetables, initial encounter: Secondary | ICD-10-CM | POA: Diagnosis not present

## 2024-10-12 DIAGNOSIS — N179 Acute kidney failure, unspecified: Secondary | ICD-10-CM | POA: Diagnosis not present

## 2024-10-12 DIAGNOSIS — S82202A Unspecified fracture of shaft of left tibia, initial encounter for closed fracture: Secondary | ICD-10-CM | POA: Diagnosis not present

## 2024-10-12 DIAGNOSIS — I4892 Unspecified atrial flutter: Secondary | ICD-10-CM | POA: Diagnosis not present

## 2024-10-12 LAB — GLUCOSE, CAPILLARY
Glucose-Capillary: 100 mg/dL — ABNORMAL HIGH (ref 70–99)
Glucose-Capillary: 174 mg/dL — ABNORMAL HIGH (ref 70–99)
Glucose-Capillary: 193 mg/dL — ABNORMAL HIGH (ref 70–99)
Glucose-Capillary: 164 mg/dL — ABNORMAL HIGH (ref 70–99)

## 2024-10-12 NOTE — Plan of Care (Signed)
  Problem: Coping: Goal: Ability to adjust to condition or change in health will improve Outcome: Progressing   Problem: Education: Goal: Knowledge of General Education information will improve Description: Including pain rating scale, medication(s)/side effects and non-pharmacologic comfort measures Outcome: Progressing   

## 2024-10-12 NOTE — Progress Notes (Signed)
 " PROGRESS NOTE    Linda Washington  FMW:[id number] DOB: Sep 02, 1951 DOA: 10/08/2024 PCP: Katina Pfeiffer, PA-C   Brief Narrative:  Patient 73 year old female history of anxiety, GERD, history of allergies and previous anaphylactic reactions, who presented with hives, swelling, respiratory distress followed by witnessed syncope after eating pancakes and jelly.  Patient received epi, Benadryl .  Patient with low BP subsequently admitted to [facility].  Patient placed on epi gtt., Benadryl , Pepcid , steroids with clinical improvement.  Patient also noted to have a left displaced fibular fracture and seen in consultation by orthopedics who recommended splint placement, nonweightbearing with outpatient follow-up.  Given new onset ambulatory dysfunction patient is currently awaiting placement at SNF, remains medically stable for discharge.     Assessment & Plan:   Principal Problem:   Anaphylaxis Active Problems:   Diabetes mellitus (HCC)   Essential hypertension   Dyslipidemia   OSA (obstructive sleep apnea)   Atrial flutter (HCC)   Acquired hypothyroidism   Chronic anticoagulation   Gastroesophageal reflux disease   Mixed anxiety and depressive disorder   Tibia/fibula fracture, left, closed, initial encounter   AKI (acute kidney injury)   Anaphylactic shock, presumably secondary to grape jelly, POA, resolved - Initially requiring epinephrine  drip now discontinued - Prednisone  course to complete 12/26, Pepcid  ongoing -Recommend further outpatient evaluation by allergist given other known comorbid allergic reactions.   Syncope secondary to above  Mechanical fall with trauma Left fibular fracture - Confirmed on imaging, orthopedic surgery recommending splint placement with nonweightbearing at this time with plan for outpatient follow-up with Dr. Elsa - No plans for intervention at this time   GERD -PPI ongoing   Insulin -dependent diabetes mellitus, well-controlled -Hemoglobin A1c  noted at 6.8 (09/29/2024) - Patient noted to be on Trulicity 1.5 mg q. weekly, Jardiance  25 mg daily, Lantus 10 units daily, metformin  1000 mg twice daily. - CBG noted at 100 this morning. - Continue SSI, hypoglycemic protocol   Hypertension/hyperlipidemia - Continue statin. - Resume home regimen Coreg .  Sick sinus syndrome status post PPM/A-fib on anticoagulation, stable - Well controlled on carvedilol /amiodarone , sinus rhythm - Apixaban  for anticoagulation   Reported hypothyroidism - TSH remains within normal limits per chart review going back 6 years - Patient is not on any supplementation, unclear if truly hypothyroid  AKI, resolved - Renal function improved.  Sacral moisture associated skin damage(MASD) versus pressure injury, POA Patient seen by wound care RN. Wound 10/08/24 1858 Pressure Injury Sacrum Mid Deep Tissue Pressure Injury - Purple or maroon localized area of discolored intact skin or blood-filled blister due to damage of underlying soft tissue from pressure and/or shear. (Active)    DVT prophylaxis: SCDs Start: 10/08/24 1521 apixaban  (ELIQUIS ) tablet 5 mg  Code Status:   Code Status: Full Code Family Communication: None present  Status is: Inpatient  Dispo: The patient is from: Home              Anticipated d/c is to: SNF              Anticipated d/c date is: Imminent              Patient currently is medically stable for discharge  Consultants:  Orthopedic surgery, PCCM  Procedures:  None  Antimicrobials:  None  Subjective: No acute issues or events overnight denies nausea vomiting diarrhea constipation headache fevers chills or chest pain.  Pain currently well-controlled, looking forward to physical therapy later this afternoon to attempt ambulation again.  Objective: Vitals:   10/11/24  1951 10/11/24 2347 10/12/24 0347 10/12/24 0500  BP: (!) 114/58 (!) 142/72 (!) 153/72   Pulse: 75 62 60   Resp: 17 15 15    Temp: 98 F (36.7 C) 97.8 F (36.6  C) 97.8 F (36.6 C)   TempSrc: Oral Oral Oral   SpO2: 97% 93% 97%   Weight:  131.3 kg  131.3 kg  Height:        Intake/Output Summary (Last 24 hours) at 10/12/2024 0716 Last data filed at 10/12/2024 0346 Gross per 24 hour  Intake 600 ml  Output 2250 ml  Net -1650 ml   Filed Weights   10/11/24 0409 10/11/24 2347 10/12/24 0500  Weight: 130.4 kg 131.3 kg 131.3 kg    Examination:  General:  Pleasantly resting in bed, No acute distress. HEENT:  Normocephalic atraumatic.  Sclerae nonicteric, noninjected.  Extraocular movements intact bilaterally. Neck:  Without mass or deformity.  Trachea is midline. Lungs:  Clear to auscultate bilaterally without rhonchi, wheeze, or rales. Heart:  Regular rate and rhythm.  Without murmurs, rubs, or gallops. Abdomen:  Soft, nontender, nondistended.  Without guarding or rebound. Extremities: Left leg splint clean dry intact Skin:  Warm and dry, discoloration of the sacrum concerning for pressure injury  Data Reviewed: I have personally reviewed following labs and imaging studies  CBC: Recent Labs  Lab 10/08/24 1438 10/09/24 0252 10/10/24 1028 10/11/24 0257  WBC 8.8 21.4* 10.5 10.4  NEUTROABS 6.5  --  7.5  --   HGB 15.8* 14.1 11.2* 11.7*  HCT 50.5* 42.8 34.0* 34.6*  MCV 96.4 92.8 91.9 90.8  PLT 337 293 276 286   Basic Metabolic Panel: Recent Labs  Lab 10/08/24 1844 10/09/24 0252 10/10/24 1028 10/11/24 0257  NA 141 136 140 139  K 3.9 4.7 3.8 3.8  CL 105 103 107 106  CO2 17* 17* 24 24  GLUCOSE 303* 163* 117* 115*  BUN 18 19 18 20   CREATININE 1.23* 1.06* 0.81 0.79  CALCIUM  9.2 8.7* 8.4* 8.7*  MG  --  1.7 1.8  --   PHOS  --  4.3  --   --    GFR: Estimated Creatinine Clearance: 84.5 mL/min (by C-G formula based on SCr of 0.79 mg/dL).  Cardiac Enzymes: Recent Labs  Lab 10/08/24 1844  CKTOTAL 115   CBG: Recent Labs  Lab 10/11/24 1134 10/11/24 1252 10/11/24 1619 10/11/24 2130 10/12/24 0611  GLUCAP 153* 198* 202* 145*  100*   Sepsis Labs: Recent Labs  Lab 10/08/24 1452  LATICACIDVEN 6.8*    Recent Results (from the past 240 hours)  MRSA Next Gen by PCR, Nasal     Status: None   Collection Time: 10/08/24  6:42 PM   Specimen: Nasal Mucosa; Nasal Swab  Result Value Ref Range Status   MRSA by PCR Next Gen NOT DETECTED NOT DETECTED Final    Comment: (NOTE) The GeneXpert MRSA Assay (FDA approved for NASAL specimens only), is one component of a comprehensive MRSA colonization surveillance program. It is not intended to diagnose MRSA infection nor to guide or monitor treatment for MRSA infections. Test performance is not FDA approved in patients less than 24 years old. Performed at [facility], [address], [place], [place] [address]          Radiology Studies: No results found.       Scheduled Meds:  amiodarone   200 mg Oral Daily   apixaban   5 mg Oral BID   atorvastatin   40 mg  Oral QHS   carvedilol   6.25 mg Oral BID WC   Chlorhexidine  Gluconate Cloth  6 each Topical Daily   cyanocobalamin   1,000 mcg Oral Daily   DULoxetine   30 mg Oral Daily   famotidine   20 mg Oral BID   insulin  aspart  0-15 Units Subcutaneous TID WC   insulin  aspart  0-5 Units Subcutaneous QHS   oxybutynin   15 mg Oral Daily   predniSONE   40 mg Oral Q breakfast   Continuous Infusions:   LOS: 4 days   Time spent:  Elsie JAYSON Montclair, DO [facility]  If 7PM-7AM, please contact night-coverage [email or web address]  10/12/2024, 7:16 AM      "

## 2024-10-12 NOTE — Progress Notes (Signed)
 Orthopedic Tech Progress Note Patient Details:  Linda Washington 05-18-1951 [id number]  Ortho Devices Type of Ortho Device: Stirrup splint, Post (short leg) splint Ortho Device/Splint Location: LLE Ortho Device/Splint Interventions: Ordered, Application, Adjustment   Post Interventions Patient Tolerated: Well Instructions Provided: Care of device New splint applied, media of LLE accidentally labeled as RLE, but it is LLE fracture blisters. Linda Washington 10/12/2024, 2:51 PM

## 2024-10-12 NOTE — TOC Progression Note (Signed)
 Transition of Care [facility]) - Progression Note    Patient Details  Name: Linda Washington MRN: [id number] Date of Birth: [birth year]  [facility] of [facility]) CM/SW Contact  Inocente GORMAN Kindle, LCSW Phone Number: 10/12/2024, 11:39 AM  Clinical Narrative:    [facility] unable to accept today due to the holiday, but insurance approval submitted and received, Ref# [id number], effective 10/13/2024-10/17/2024.    Expected Discharge Plan: Skilled Nursing Facility Barriers to Discharge: Continued Medical Work up, SNF Pending bed offer, English As A Second Language Teacher               Expected Discharge Plan and Services In-house Referral: Clinical Social Work Discharge Planning Services: CM Consult Post Acute Care Choice: Skilled Nursing Facility Living arrangements for the past 2 months: Single Family Home                                       Social Drivers of Health (SDOH) Interventions SDOH Screenings   Food Insecurity: No Food Insecurity (10/09/2024)  Housing: Low Risk (10/09/2024)  Transportation Needs: No Transportation Needs (10/09/2024)  Utilities: Not At Risk (10/09/2024)  Social Connections: Moderately Integrated (10/09/2024)  Tobacco Use: Low Risk (10/09/2024)    Readmission Risk Interventions    10/10/2024    8:37 AM  Readmission Risk Prevention Plan  Post Dischage Appt Complete  Medication Screening Complete  Transportation Screening Complete

## 2024-10-12 NOTE — Progress Notes (Signed)
" °   10/12/24 0928  Vitals  Temp 97.9 F (36.6 C)  Temp Source Oral  BP (!) 121/95  MAP (mmHg) 104  BP Location Left Arm  BP Method Automatic  Patient Position (if appropriate) Sitting  Pulse Rate 78  Pulse Rate Source Monitor  ECG Heart Rate 80  Resp 15  Level of Consciousness  Level of Consciousness Alert  MEWS COLOR  MEWS Score Color Green  Oxygen  Therapy  SpO2 93 %  O2 Device Room Air  Pain Assessment  Pain Scale 0-10  Pain Score 6  Pain Type Acute pain  Pain Location Ankle  Pain Orientation Left  Pain Descriptors / Indicators Aching  Pain Onset On-going  MEWS Score  MEWS Temp 0  MEWS Systolic 0  MEWS Pulse 0  MEWS RR 0  MEWS LOC 0  MEWS Score 0   Patient was attempting to sit in the reclining chair after using bed side commode. Missed the chair and I assisted her to the floor. It was smooth assist to the floor from a low position. She denies hitting her body against the floor. MD made aware. Son in the room. "

## 2024-10-13 ENCOUNTER — Other Ambulatory Visit (HOSPITAL_COMMUNITY): Payer: Self-pay

## 2024-10-13 ENCOUNTER — Inpatient Hospital Stay (HOSPITAL_COMMUNITY)

## 2024-10-13 DIAGNOSIS — S82202A Unspecified fracture of shaft of left tibia, initial encounter for closed fracture: Secondary | ICD-10-CM | POA: Diagnosis not present

## 2024-10-13 DIAGNOSIS — N179 Acute kidney failure, unspecified: Secondary | ICD-10-CM | POA: Diagnosis not present

## 2024-10-13 DIAGNOSIS — T7804XA Anaphylactic reaction due to fruits and vegetables, initial encounter: Secondary | ICD-10-CM | POA: Diagnosis not present

## 2024-10-13 DIAGNOSIS — I4892 Unspecified atrial flutter: Secondary | ICD-10-CM | POA: Diagnosis not present

## 2024-10-13 LAB — APTT
aPTT: 32 s (ref 24–36)
aPTT: 40 s — ABNORMAL HIGH (ref 24–36)

## 2024-10-13 LAB — SURGICAL PCR SCREEN
MRSA, PCR: NEGATIVE
Staphylococcus aureus: NEGATIVE

## 2024-10-13 LAB — GLUCOSE, CAPILLARY
Glucose-Capillary: 98 mg/dL (ref 70–99)
Glucose-Capillary: 161 mg/dL — ABNORMAL HIGH (ref 70–99)
Glucose-Capillary: 199 mg/dL — ABNORMAL HIGH (ref 70–99)
Glucose-Capillary: 159 mg/dL — ABNORMAL HIGH (ref 70–99)

## 2024-10-13 LAB — HEPARIN LEVEL (UNFRACTIONATED): Heparin Unfractionated: >1.1 [IU]/mL — ABNORMAL HIGH (ref 0.30–0.70)

## 2024-10-13 MED ORDER — HEPARIN (PORCINE) 25000 UT/250ML-% IV SOLN
1500.0000 [IU]/h | INTRAVENOUS | Status: DC
  Administered 2024-10-13: 1500 [IU]/h via INTRAVENOUS
  Administered 2024-10-13: 1250 [IU]/h via INTRAVENOUS
  Filled 2024-10-13 (×2): qty 250

## 2024-10-13 NOTE — Plan of Care (Signed)

## 2024-10-13 NOTE — Progress Notes (Signed)
 PHARMACY - ANTICOAGULATION CONSULT NOTE  Pharmacy Consult for apixaban  to heparin  gtt Indication: atrial fibrillation  Allergies[1]  Patient Measurements: Height: 5' 6 (167.6 cm) Weight: 127.2 kg (280 lb 6.8 oz) IBW/kg (Calculated) : 59.3 HEPARIN  DW (KG): 88.5  Vital Signs: Temp: 99.4 F (37.4 C) (12/26 0737) Temp Source: Oral (12/26 0737) BP: 141/67 (12/26 0737) Pulse Rate: 98 (12/26 0529)  Labs: Recent Labs    10/10/24 1028 10/11/24 0257  HGB 11.2* 11.7*  HCT 34.0* 34.6*  PLT 276 286  CREATININE 0.81 0.79    Estimated Creatinine Clearance: 83 mL/min (by C-G formula based on SCr of 0.79 mg/dL).   Medical History: Past Medical History:  Diagnosis Date   Acute on chronic diastolic (congestive) heart failure (HCC) 06/2018   Anxiety    takes Citalopram  daily   Arthritis    generalized   Basal cell carcinoma    GERD (gastroesophageal reflux disease)    takes Omeprazole daily   History of Bell's palsy    left side   History of bronchitis    History of hiatal hernia    History of kidney stones    Hyperlipemia    takes Simvastatin  daily   Hypertension    takes Enalapril  daily   Hypothyroidism    Incontinence    takes Ditropan  daily   Joint swelling    left knee   Neuropathy    takes Gabapentin  daily   PONV (postoperative nausea and vomiting)    Presence of permanent cardiac pacemaker    RLS (restless legs syndrome)    Seasonal allergies    takes Claritin  daioy as needed   Sleep apnea    pt. states that she does not have a CPAP   Tremor    Type 2 diabetes mellitus (HCC)    takes Metformin  and Amaryl  daily    Assessment: 73 yo female on apixaban  for atrial fibrillation, admitted for anaphylaxs and subsequent fall with fibula fracture.  Ortho planning probable surgical repair 12/27. Pharmacy consulted to convert apixaban  to heparin  drip.  Last dose apixaban  12/25 pm.  Heparin  levels will be falsely elevated due to recent apixaban , will use aPTT to  monitor heparin  function until they correlate.  Goal of Therapy:  Heparin  level 0.3-0.7 units/ml aPTT 66-102 seconds Monitor platelets by anticoagulation protocol: Yes   Plan:  Start heparin  infusion at 1250 units/hr Check anti-Xa level and aPTT baseline and an aPTT in 8 hours Check anti-Xa and aPTT daily while on heparin  until they correlate then switch to heparin  levels only. Continue to monitor H&H and platelets  Donny Alert, PharmD, [facility] Clinical Pharmacist Please see AMION for all Pharmacists' Contact Phone Numbers 10/13/2024, 8:01 AM        [1]  Allergies Allergen Reactions   Bovine (Beef) Protein-Containing Drug Products Anaphylaxis   Other Anaphylaxis    Sweet jelly   Cyclobenzaprine Other (See Comments)    Other Reaction(s): makes pt funky headed   Gabapentin  Other (See Comments)    Other Reaction(s): cognitive affect   Hydrocodone Other (See Comments)    Weird sensations mentally; makes me fuzzy   Methocarbamol  Hives and Other (See Comments)   Oxycodone Other (See Comments)    Weird sensations mentally; makes me fuzzy   Tinidazole Hives   Latex Hives and Rash

## 2024-10-13 NOTE — Progress Notes (Signed)
 " PROGRESS NOTE    Linda Washington  FMW:[id number] DOB: [birth year] DOA: 10/08/2024 PCP: Katina Pfeiffer, PA-C   Brief Narrative:  Patient 73 year old female history of anxiety, GERD, history of allergies and previous anaphylactic reactions, who presented with hives, swelling, respiratory distress followed by witnessed syncope after eating pancakes and jelly.  Patient received epi, Benadryl .  Patient with low BP subsequently admitted to [facility].  Patient placed on epi gtt., Benadryl , Pepcid , steroids with clinical improvement.  Patient also noted to have a left displaced fibular fracture and seen in consultation by orthopedics who recommended splint placement, nonweightbearing with outpatient follow-up.  Given new onset ambulatory dysfunction patient is currently awaiting placement at SNF, remains medically stable for discharge.  Assessment & Plan:   Principal Problem:   Anaphylaxis Active Problems:   Diabetes mellitus (HCC)   Essential hypertension   Dyslipidemia   OSA (obstructive sleep apnea)   Atrial flutter (HCC)   Acquired hypothyroidism   Chronic anticoagulation   Gastroesophageal reflux disease   Mixed anxiety and depressive disorder   Tibia/fibula fracture, left, closed, initial encounter   AKI (acute kidney injury)   Syncope secondary to anaphylactic shock as below, POA, resolved Mechanical fall with trauma Left fibular fracture - Confirmed on imaging - Orthopedics upon further evaluation plan to intervene on left fibular fracture 12/27 - Eliquis  currently on hold perioperatively defer to Ortho on restart date; n.p.o. at midnight -Heparin  drip in the interim  Anaphylactic shock, presumably secondary to grape jelly, POA, resolved - Initially requiring epinephrine  drip now discontinued - Prednisone  course to complete 12/26, Pepcid  ongoing -Recommend further outpatient evaluation by allergist given other known comorbid allergic reactions.   GERD -PPI ongoing    Insulin -dependent diabetes mellitus, well-controlled -Hemoglobin A1c noted at 6.8 (09/29/2024) - Patient noted to be on Trulicity 1.5 mg q. weekly, Jardiance  25 mg daily, Lantus 10 units daily, metformin  1000 mg twice daily. - CBG noted at 100 this morning. - Continue SSI, hypoglycemic protocol   Hypertension/hyperlipidemia - Continue statin. - Resume home regimen Coreg .  Sick sinus syndrome status post PPM/A-fib on anticoagulation, stable - Well controlled on carvedilol /amiodarone , sinus rhythm - Apixaban  for anticoagulation   Reported hypothyroidism - TSH remains within normal limits per chart review going back 6 years - Patient is not on any supplementation, unclear if truly hypothyroid  AKI, resolved - Renal function improved.  Sacral moisture associated skin damage(MASD) versus pressure injury, POA Patient seen by wound care RN. Wound 10/08/24 1858 Pressure Injury Sacrum Mid Deep Tissue Pressure Injury - Purple or maroon localized area of discolored intact skin or blood-filled blister due to damage of underlying soft tissue from pressure and/or shear. (Active)    DVT prophylaxis: SCDs Start: 10/08/24 1521  Code Status:   Code Status: Full Code Family Communication: None present  Status is: Inpatient  Dispo: The patient is from: Home              Anticipated d/c is to: SNF              Anticipated d/c date is: 48h              Patient currently is medically stable for discharge  Consultants:  Orthopedic surgery, PCCM  Procedures:  Tentative ORIF planned 12/27  Antimicrobials:  None  Subjective: No acute issues or events overnight denies nausea vomiting diarrhea constipation headache fevers chills or chest pain.  Pain currently well-controlled, looking forward to physical therapy later this afternoon to attempt ambulation again.  Objective: Vitals:   10/12/24 2009 10/12/24 2337 10/13/24 0528 10/13/24 0529  BP: (!) 137/59 (!) 137/55  (!) 146/54  Pulse: 61 (!)  59  98  Resp: 16 16  18   Temp: 98.3 F (36.8 C) 98 F (36.7 C)  97.6 F (36.4 C)  TempSrc: Oral Oral  Oral  SpO2: 95% 96%  98%  Weight:   127.2 kg   Height:        Intake/Output Summary (Last 24 hours) at 10/13/2024 0739 Last data filed at 10/13/2024 0530 Gross per 24 hour  Intake 240 ml  Output 2150 ml  Net -1910 ml   Filed Weights   10/11/24 2347 10/12/24 0500 10/13/24 0528  Weight: 131.3 kg 131.3 kg 127.2 kg    Examination:  General:  Pleasantly resting in bed, No acute distress. HEENT:  Normocephalic atraumatic.  Sclerae nonicteric, noninjected.  Extraocular movements intact bilaterally. Neck:  Without mass or deformity.  Trachea is midline. Lungs:  Clear to auscultate bilaterally without rhonchi, wheeze, or rales. Heart:  Regular rate and rhythm.  Without murmurs, rubs, or gallops. Abdomen:  Soft, nontender, nondistended.  Without guarding or rebound. Extremities: Left leg splint clean dry intact Skin:  Warm and dry, discoloration of the sacrum concerning for pressure injury  Data Reviewed: I have personally reviewed following labs and imaging studies  CBC: Recent Labs  Lab 10/08/24 1438 10/09/24 0252 10/10/24 1028 10/11/24 0257  WBC 8.8 21.4* 10.5 10.4  NEUTROABS 6.5  --  7.5  --   HGB 15.8* 14.1 11.2* 11.7*  HCT 50.5* 42.8 34.0* 34.6*  MCV 96.4 92.8 91.9 90.8  PLT 337 293 276 286   Basic Metabolic Panel: Recent Labs  Lab 10/08/24 1844 10/09/24 0252 10/10/24 1028 10/11/24 0257  NA 141 136 140 139  K 3.9 4.7 3.8 3.8  CL 105 103 107 106  CO2 17* 17* 24 24  GLUCOSE 303* 163* 117* 115*  BUN 18 19 18 20   CREATININE 1.23* 1.06* 0.81 0.79  CALCIUM  9.2 8.7* 8.4* 8.7*  MG  --  1.7 1.8  --   PHOS  --  4.3  --   --    GFR: Estimated Creatinine Clearance: 83 mL/min (by C-G formula based on SCr of 0.79 mg/dL).  Cardiac Enzymes: Recent Labs  Lab 10/08/24 1844  CKTOTAL 115   CBG: Recent Labs  Lab 10/12/24 0611 10/12/24 1117 10/12/24 1614  10/12/24 2047 10/13/24 0525  GLUCAP 100* 174* 193* 164* 98   Sepsis Labs: Recent Labs  Lab 10/08/24 1452  LATICACIDVEN 6.8*    Recent Results (from the past 240 hours)  MRSA Next Gen by PCR, Nasal     Status: None   Collection Time: 10/08/24  6:42 PM   Specimen: Nasal Mucosa; Nasal Swab  Result Value Ref Range Status   MRSA by PCR Next Gen NOT DETECTED NOT DETECTED Final    Comment: (NOTE) The GeneXpert MRSA Assay (FDA approved for NASAL specimens only), is one component of a comprehensive MRSA colonization surveillance program. It is not intended to diagnose MRSA infection nor to guide or monitor treatment for MRSA infections. Test performance is not FDA approved in patients less than 53 years old. Performed at [facility], [address] [place], [place] [address]          Radiology Studies: No results found.       Scheduled Meds:  amiodarone   200 mg Oral Daily   atorvastatin   40 mg Oral QHS  carvedilol   6.25 mg Oral BID WC   Chlorhexidine  Gluconate Cloth  6 each Topical Daily   cyanocobalamin   1,000 mcg Oral Daily   DULoxetine   30 mg Oral Daily   famotidine   20 mg Oral BID   insulin  aspart  0-15 Units Subcutaneous TID WC   insulin  aspart  0-5 Units Subcutaneous QHS   oxybutynin   15 mg Oral Daily   predniSONE   40 mg Oral Q breakfast   Continuous Infusions:   LOS: 5 days   Time spent:  Elsie JAYSON Montclair, DO [facility]  If 7PM-7AM, please contact night-coverage [email or web address]  10/13/2024, 7:39 AM      "

## 2024-10-13 NOTE — Care Management Important Message (Signed)
 Important Message  Patient Details  Name: Linda Washington MRN: [id number] Date of Birth: December 26, 1950   Important Message Given:  Yes - Medicare IM     Claretta Deed 10/13/2024, 3:03 PM

## 2024-10-13 NOTE — Plan of Care (Signed)
  Problem: Skin Integrity: Goal: Risk for impaired skin integrity will decrease Outcome: Progressing   Problem: Education: Goal: Knowledge of General Education information will improve Description: Including pain rating scale, medication(s)/side effects and non-pharmacologic comfort measures Outcome: Progressing   Problem: Clinical Measurements: Goal: Respiratory complications will improve Outcome: Progressing

## 2024-10-13 NOTE — Anesthesia Preprocedure Evaluation (Addendum)
"                                    Anesthesia Evaluation  Patient identified by MRN, date of birth, ID band Patient awake    Reviewed: Allergy & Precautions, H&P , NPO status , Patient's Chart, lab work & pertinent test results  History of Anesthesia Complications (+) PONV and history of anesthetic complications  Airway Mallampati: II  TM Distance: >3 FB Neck ROM: Full    Dental no notable dental hx. (+) Teeth Intact, Dental Advisory Given   Pulmonary asthma , sleep apnea    Pulmonary exam normal breath sounds clear to auscultation       Cardiovascular Exercise Tolerance: Good hypertension, Pt. on medications and Pt. on home beta blockers +CHF  + dysrhythmias Atrial Fibrillation + pacemaker + Valvular Problems/Murmurs MR  Rhythm:Regular Rate:Normal     Neuro/Psych   Anxiety Depression    negative neurological ROS     GI/Hepatic Neg liver ROS, hiatal hernia,GERD  Medicated,,  Endo/Other  diabetes, Insulin  Dependent, Oral Hypoglycemic AgentsHypothyroidism  Class 3 obesity  Renal/GU negative Renal ROS  negative genitourinary   Musculoskeletal  (+) Arthritis , Osteoarthritis,    Abdominal   Peds  Hematology negative hematology ROS (+)   Anesthesia Other Findings   Reproductive/Obstetrics negative OB ROS                              Anesthesia Physical Anesthesia Plan  ASA: 3  Anesthesia Plan: General   Post-op Pain Management: Regional block* and Tylenol  PO (pre-op )*   Induction: Intravenous  PONV Risk Score and Plan: 4 or greater and Ondansetron , Dexamethasone  and Treatment may vary due to age or medical condition  Airway Management Planned: Oral ETT  Additional Equipment:   Intra-op Plan:   Post-operative Plan: Extubation in OR  Informed Consent: I have reviewed the patients History and Physical, chart, labs and discussed the procedure including the risks, benefits and alternatives for the proposed  anesthesia with the patient or authorized representative who has indicated his/her understanding and acceptance.     Dental advisory given  Plan Discussed with: CRNA  Anesthesia Plan Comments:          Anesthesia Quick Evaluation  "

## 2024-10-13 NOTE — Progress Notes (Signed)
 PHARMACY - ANTICOAGULATION CONSULT NOTE  Pharmacy Consult for apixaban  to heparin  gtt Indication: atrial fibrillation  Allergies[1]  Patient Measurements: Height: 5' 6 (167.6 cm) Weight: 127.2 kg (280 lb 6.8 oz) IBW/kg (Calculated) : 59.3 HEPARIN  DW (KG): 88.5  Vital Signs: Temp: 98.1 F (36.7 C) (12/26 1500) Temp Source: Oral (12/26 1500) BP: 128/59 (12/26 1500) Pulse Rate: 59 (12/26 1500)  Labs: Recent Labs    10/11/24 0257 10/13/24 0934 10/13/24 1613  HGB 11.7*  --   --   HCT 34.6*  --   --   PLT 286  --   --   APTT  --  32 40*  HEPARINUNFRC  --  >1.10*  --   CREATININE 0.79  --   --     Estimated Creatinine Clearance: 83 mL/min (by C-G formula based on SCr of 0.79 mg/dL).   Medical History: Past Medical History:  Diagnosis Date   Acute on chronic diastolic (congestive) heart failure (HCC) 06/2018   Anxiety    takes Citalopram  daily   Arthritis    generalized   Basal cell carcinoma    GERD (gastroesophageal reflux disease)    takes Omeprazole daily   History of Bell's palsy    left side   History of bronchitis    History of hiatal hernia    History of kidney stones    Hyperlipemia    takes Simvastatin  daily   Hypertension    takes Enalapril  daily   Hypothyroidism    Incontinence    takes Ditropan  daily   Joint swelling    left knee   Neuropathy    takes Gabapentin  daily   PONV (postoperative nausea and vomiting)    Presence of permanent cardiac pacemaker    RLS (restless legs syndrome)    Seasonal allergies    takes Claritin  daioy as needed   Sleep apnea    pt. states that she does not have a CPAP   Tremor    Type 2 diabetes mellitus (HCC)    takes Metformin  and Amaryl  daily    Assessment: 73 yo female on apixaban  for atrial fibrillation, admitted for anaphylaxs and subsequent fall with fibula fracture.  Ortho planning probable surgical repair 12/27. Pharmacy consulted to convert apixaban  to heparin  drip.  Last dose apixaban  12/25  pm.  Heparin  levels will be falsely elevated due to recent apixaban , will use aPTT to monitor heparin  function until they correlate.  12/26 PM: First aPTT subtherapeutic at 40. Will increase infusion rate.  Goal of Therapy:  Heparin  level 0.3-0.7 units/ml aPTT 66-102 seconds Monitor platelets by anticoagulation protocol: Yes   Plan:  Increase heparin  infusion to 1500 units/hr Check anti-Xa level and aPTT with AM labs Check anti-Xa and aPTT daily while on heparin  until they correlate then switch to heparin  levels only. Continue to monitor H&H and platelets  Linda Washington, PharmD Clinical Pharmacist 10/13/2024  6:11 PM **Pharmacist phone directory can now be found on [email or web address] ([email or web address]).  Listed under [facility].          [1]  Allergies Allergen Reactions   Bovine (Beef) Protein-Containing Drug Products Anaphylaxis   Other Anaphylaxis    Sweet jelly   Cyclobenzaprine Other (See Comments)    Other Reaction(s): makes pt funky headed   Gabapentin  Other (See Comments)    Other Reaction(s): cognitive affect   Hydrocodone Other (See Comments)    Weird sensations mentally; makes me fuzzy   Methocarbamol  Hives and Other (See Comments)   Oxycodone  Other (See  Comments)    Weird sensations mentally; makes me fuzzy   Tinidazole Hives   Latex Hives and Rash

## 2024-10-13 NOTE — Progress Notes (Addendum)
 Patient ID: Linda Washington, female   DOB: [birth year], 73 y.o.   MRN: [id number]   LOS: 5 days   Subjective: Stable. Says blister broke yesterday.   Objective: Vital signs in last 24 hours: Temp:  [97.6 F (36.4 C)-99.4 F (37.4 C)] 97.8 F (36.6 C) (12/26 1059) Pulse Rate:  [59-98] 60 (12/26 1059) Resp:  [16-22] 22 (12/26 1059) BP: (127-146)/(54-67) 127/61 (12/26 1059) SpO2:  [95 %-98 %] 95 % (12/26 1059) Weight:  [127.2 kg] 127.2 kg (12/26 0528) Last BM Date : 10/12/24   Laboratory  CBC Recent Labs    10/11/24 0257  WBC 10.4  HGB 11.7*  HCT 34.6*  PLT 286   BMET Recent Labs    10/11/24 0257  NA 139  K 3.8  CL 106  CO2 24  GLUCOSE 115*  BUN 20  CREATININE 0.79  CALCIUM  8.7*     Physical Exam General appearance: alert and no distress LLE No traumatic wounds, ecchymosis, or rash  Short leg splint in place  No knee effusion  Knee stable to varus/ valgus and anterior/posterior stress  Sens DPN, SPN, TN intact  Motor EHL 5/5  Toes perfused, No significant edema    Assessment/Plan: Left ankle fx -- Plan ORIF tomorrow with Dr. Barton. Please keep NPO after MN.    Linda DOROTHA Ned, PA-C Orthopedic Surgery [phone] 10/13/2024  Linda Barton, MD Orthopaedic Surgery [facility]

## 2024-10-13 NOTE — Care Management Important Message (Signed)
 Important Message  Patient Details  Name: Linda Washington MRN: [id number] Date of Birth: 05/06/1951   Important Message Given:  Yes - Medicare IM     Claretta Deed 10/13/2024, 3:01 PM

## 2024-10-13 NOTE — TOC Progression Note (Addendum)
 Transition of Care [facility]) - Progression Note    Patient Details  Name: Linda Washington MRN: [id number] Date of Birth: 08/26/1951  Transition of [facility] CM/SW Contact  Isaiah Public, LCSWA Phone Number: 10/13/2024, 11:32 AM  Clinical Narrative:     Randine with [facility] confirmed facility can accept patient today if medically ready. CSW informed MD. MD informed CSW patient not medically ready today. CSW informed facility.  Expected Discharge Plan: Skilled Nursing Facility Barriers to Discharge: Continued Medical Work up, SNF Pending bed offer, English As A Second Language Teacher               Expected Discharge Plan and Services In-house Referral: Clinical Social Work Discharge Planning Services: CM Consult Post Acute Care Choice: Skilled Nursing Facility Living arrangements for the past 2 months: Single Family Home                                       Social Drivers of Health (SDOH) Interventions SDOH Screenings   Food Insecurity: No Food Insecurity (10/09/2024)  Housing: Low Risk (10/09/2024)  Transportation Needs: No Transportation Needs (10/09/2024)  Utilities: Not At Risk (10/09/2024)  Social Connections: Moderately Integrated (10/09/2024)  Tobacco Use: Low Risk (10/09/2024)    Readmission Risk Interventions    10/10/2024    8:37 AM  Readmission Risk Prevention Plan  Post Dischage Appt Complete  Medication Screening Complete  Transportation Screening Complete

## 2024-10-13 NOTE — Care Plan (Addendum)
 Orthopaedic Surgery Plan of Care Note   -history and imaging reviewed with requesting team (Hospitalist) -pt has left bimalleolar equivalent ankle fx with suspected syndesmosis disruption -PMH includes anxiety, GERD, history of allergies and previous anaphylactic reaction, heart failure, therapeutic anticoagulation (Eliquis ) -please keep NPO and hold Eliquis  for OR 10/14/24 AM -elevate heel above hip (order placed), ice application -CT left ankle ordered for preop planning to rule out posterior malleolus fx -strict NWB LLE -PT/OT postop with likely SNF placement -full consult note to follow   Lillia Mountain, MD Orthopaedic Surgery [facility]

## 2024-10-14 ENCOUNTER — Inpatient Hospital Stay (HOSPITAL_COMMUNITY): Payer: Self-pay | Admitting: Anesthesiology

## 2024-10-14 ENCOUNTER — Encounter (HOSPITAL_COMMUNITY)
Admission: EM | Disposition: A | Discharge status: Skilled Nursing Facility | Payer: Self-pay | Source: Home / Self Care | Attending: Internal Medicine

## 2024-10-14 ENCOUNTER — Encounter (HOSPITAL_COMMUNITY): Payer: Self-pay | Admitting: Internal Medicine

## 2024-10-14 ENCOUNTER — Inpatient Hospital Stay (HOSPITAL_COMMUNITY)

## 2024-10-14 DIAGNOSIS — S82852A Displaced trimalleolar fracture of left lower leg, initial encounter for closed fracture: Secondary | ICD-10-CM | POA: Diagnosis not present

## 2024-10-14 DIAGNOSIS — T7804XA Anaphylactic reaction due to fruits and vegetables, initial encounter: Secondary | ICD-10-CM | POA: Diagnosis not present

## 2024-10-14 DIAGNOSIS — I4891 Unspecified atrial fibrillation: Secondary | ICD-10-CM

## 2024-10-14 DIAGNOSIS — S82202A Unspecified fracture of shaft of left tibia, initial encounter for closed fracture: Secondary | ICD-10-CM | POA: Diagnosis not present

## 2024-10-14 DIAGNOSIS — I5032 Chronic diastolic (congestive) heart failure: Secondary | ICD-10-CM | POA: Diagnosis not present

## 2024-10-14 DIAGNOSIS — N179 Acute kidney failure, unspecified: Secondary | ICD-10-CM | POA: Diagnosis not present

## 2024-10-14 DIAGNOSIS — I110 Hypertensive heart disease with heart failure: Secondary | ICD-10-CM | POA: Diagnosis not present

## 2024-10-14 DIAGNOSIS — I4892 Unspecified atrial flutter: Secondary | ICD-10-CM | POA: Diagnosis not present

## 2024-10-14 HISTORY — PX: ORIF ANKLE FRACTURE: SHX5408

## 2024-10-14 LAB — BASIC METABOLIC PANEL WITH GFR
Anion gap: 7 (ref 5–15)
BUN: 16 mg/dL (ref 8–23)
CO2: 27 mmol/L (ref 22–32)
Calcium: 8.7 mg/dL — ABNORMAL LOW (ref 8.9–10.3)
Chloride: 105 mmol/L (ref 98–111)
Creatinine, Ser: 0.79 mg/dL (ref 0.44–1.00)
GFR, Estimated: >60 mL/min
Glucose, Bld: 117 mg/dL — ABNORMAL HIGH (ref 70–99)
Potassium: 3.4 mmol/L — ABNORMAL LOW (ref 3.5–5.1)
Sodium: 140 mmol/L (ref 135–145)

## 2024-10-14 LAB — CBC
HCT: 35 % — ABNORMAL LOW (ref 36.0–46.0)
Hemoglobin: 11.6 g/dL — ABNORMAL LOW (ref 12.0–15.0)
MCH: 29.9 pg (ref 26.0–34.0)
MCHC: 33.1 g/dL (ref 30.0–36.0)
MCV: 90.2 fL (ref 80.0–100.0)
Platelets: 351 K/uL (ref 150–400)
RBC: 3.88 MIL/uL (ref 3.87–5.11)
RDW: 13.7 % (ref 11.5–15.5)
WBC: 12.4 K/uL — ABNORMAL HIGH (ref 4.0–10.5)
nRBC: 0 % (ref 0.0–0.2)

## 2024-10-14 LAB — GLUCOSE, CAPILLARY
Glucose-Capillary: 104 mg/dL — ABNORMAL HIGH (ref 70–99)
Glucose-Capillary: 110 mg/dL — ABNORMAL HIGH (ref 70–99)
Glucose-Capillary: 143 mg/dL — ABNORMAL HIGH (ref 70–99)
Glucose-Capillary: 175 mg/dL — ABNORMAL HIGH (ref 70–99)
Glucose-Capillary: 217 mg/dL — ABNORMAL HIGH (ref 70–99)
Glucose-Capillary: 136 mg/dL — ABNORMAL HIGH (ref 70–99)

## 2024-10-14 LAB — HEPARIN LEVEL (UNFRACTIONATED): Heparin Unfractionated: 0.86 [IU]/mL — ABNORMAL HIGH (ref 0.30–0.70)

## 2024-10-14 LAB — APTT: aPTT: 62 s — ABNORMAL HIGH (ref 24–36)

## 2024-10-14 MED ORDER — FENTANYL CITRATE (PF) 250 MCG/5ML IJ SOLN
INTRAMUSCULAR | Status: DC | PRN
  Administered 2024-10-14: 100 ug via INTRAVENOUS

## 2024-10-14 MED ORDER — PROPOFOL 10 MG/ML IV BOLUS
INTRAVENOUS | Status: AC
  Filled 2024-10-14: qty 20

## 2024-10-14 MED ORDER — PHENYLEPHRINE 80 MCG/ML (10ML) SYRINGE FOR IV PUSH (FOR BLOOD PRESSURE SUPPORT)
PREFILLED_SYRINGE | INTRAVENOUS | Status: DC | PRN
  Administered 2024-10-14 (×4): 80 ug via INTRAVENOUS

## 2024-10-14 MED ORDER — APIXABAN 5 MG PO TABS
5.0000 mg | ORAL_TABLET | Freq: Two times a day (BID) | ORAL | Status: DC
  Administered 2024-10-15 – 2024-10-16 (×3): 5 mg via ORAL
  Filled 2024-10-14 (×2): qty 1

## 2024-10-14 MED ORDER — PROPOFOL 10 MG/ML IV BOLUS
INTRAVENOUS | Status: DC | PRN
  Administered 2024-10-14: 130 mg via INTRAVENOUS

## 2024-10-14 MED ORDER — CEFAZOLIN SODIUM-DEXTROSE 2-3 GM-%(50ML) IV SOLR
INTRAVENOUS | Status: DC | PRN
  Administered 2024-10-14: 2 g via INTRAVENOUS

## 2024-10-14 MED ORDER — INSULIN ASPART 100 UNIT/ML IJ SOLN: 0.0000 [IU] | INTRAMUSCULAR | Status: DC | PRN

## 2024-10-14 MED ORDER — BUPIVACAINE HCL (PF) 0.5 % IJ SOLN
INTRAMUSCULAR | Status: AC
  Filled 2024-10-14: qty 30

## 2024-10-14 MED ORDER — LACTATED RINGERS IV SOLN: INTRAVENOUS | Status: DC

## 2024-10-14 MED ORDER — CEFAZOLIN SODIUM 1 G IJ SOLR
INTRAMUSCULAR | Status: AC
  Filled 2024-10-14: qty 20

## 2024-10-14 MED ORDER — LACTATED RINGERS IV SOLN: INTRAVENOUS | Status: DC | PRN

## 2024-10-14 MED ORDER — ONDANSETRON HCL 4 MG/2ML IJ SOLN
INTRAMUSCULAR | Status: DC | PRN
  Administered 2024-10-14: 4 mg via INTRAVENOUS

## 2024-10-14 MED ORDER — CEFAZOLIN SODIUM-DEXTROSE 2-4 GM/100ML-% IV SOLN
2.0000 g | Freq: Three times a day (TID) | INTRAVENOUS | Status: AC
  Administered 2024-10-14 – 2024-10-15 (×3): 2 g via INTRAVENOUS
  Filled 2024-10-14 (×3): qty 100

## 2024-10-14 MED ORDER — 0.9 % SODIUM CHLORIDE (POUR BTL) OPTIME
TOPICAL | Status: DC | PRN
  Administered 2024-10-14: 1000 mL

## 2024-10-14 MED ORDER — OXYCODONE HCL 5 MG PO TABS: 5.0000 mg | ORAL_TABLET | ORAL | 0 refills | Status: AC | PRN

## 2024-10-14 MED ORDER — VASOPRESSIN 20 UNIT/ML IV SOLN
INTRAVENOUS | Status: AC
  Filled 2024-10-14: qty 1

## 2024-10-14 MED ORDER — SUGAMMADEX SODIUM 200 MG/2ML IV SOLN
INTRAVENOUS | Status: DC | PRN
  Administered 2024-10-14: 400 mg via INTRAVENOUS

## 2024-10-14 MED ORDER — PHENYLEPHRINE HCL-NACL 20-0.9 MG/250ML-% IV SOLN
INTRAVENOUS | Status: DC | PRN
  Administered 2024-10-14: 50 ug/min via INTRAVENOUS

## 2024-10-14 MED ORDER — BUPIVACAINE LIPOSOME 1.3 % IJ SUSP
INTRAMUSCULAR | Status: AC
  Filled 2024-10-14: qty 20

## 2024-10-14 MED ORDER — ROCURONIUM BROMIDE 10 MG/ML (PF) SYRINGE
PREFILLED_SYRINGE | INTRAVENOUS | Status: DC | PRN
  Administered 2024-10-14: 70 mg via INTRAVENOUS

## 2024-10-14 MED ORDER — DEXAMETHASONE SOD PHOSPHATE PF 10 MG/ML IJ SOLN
INTRAMUSCULAR | Status: DC | PRN
  Administered 2024-10-14: 5 mg via INTRAVENOUS

## 2024-10-14 MED ORDER — ORAL CARE MOUTH RINSE: 15.0000 mL | Freq: Once | OROMUCOSAL | Status: AC

## 2024-10-14 MED ORDER — FENTANYL CITRATE (PF) 100 MCG/2ML IJ SOLN: 25.0000 ug | INTRAMUSCULAR | Status: DC | PRN

## 2024-10-14 MED ORDER — LIDOCAINE 2% (20 MG/ML) 5 ML SYRINGE
INTRAMUSCULAR | Status: DC | PRN
  Administered 2024-10-14: 40 mg via INTRAVENOUS

## 2024-10-14 MED ORDER — FENTANYL CITRATE (PF) 100 MCG/2ML IJ SOLN
INTRAMUSCULAR | Status: AC
  Filled 2024-10-14: qty 2

## 2024-10-14 MED ORDER — BUPIVACAINE-EPINEPHRINE (PF) 0.5% -1:200000 IJ SOLN
INTRAMUSCULAR | Status: DC | PRN
  Administered 2024-10-14: 30 mL via PERINEURAL
  Administered 2024-10-14: 10 mL via PERINEURAL

## 2024-10-14 MED ORDER — CHLORHEXIDINE GLUCONATE 0.12 % MT SOLN
15.0000 mL | Freq: Once | OROMUCOSAL | Status: AC
  Administered 2024-10-14: 15 mL via OROMUCOSAL

## 2024-10-14 NOTE — Transfer of Care (Signed)
 Immediate Anesthesia Transfer of Care Note  Patient: Linda Washington  Procedure(s) Performed: OPEN REDUCTION INTERNAL FIXATION (ORIF) ANKLE FRACTURE (Left: Ankle)  Patient Location: PACU  Anesthesia Type:General  Level of Consciousness: awake, alert , oriented, and patient cooperative  Airway & Oxygen  Therapy: Patient Spontanous Breathing and Patient connected to face mask oxygen   Post-op Assessment: Report given to RN, Post -op Vital signs reviewed and stable, Patient moving all extremities, and Patient moving all extremities X 4  Post vital signs: Reviewed and stable  Last Vitals:  Vitals Value Taken Time  BP 142/64 10/14/24 09:30  Temp    Pulse 61 10/14/24 09:34  Resp 21 10/14/24 09:34  SpO2 100 % 10/14/24 09:34  Vitals shown include unfiled device data.  Last Pain:  Vitals:   10/14/24 0639  TempSrc: Oral  PainSc: 0-No pain         Complications: No notable events documented.

## 2024-10-14 NOTE — Plan of Care (Signed)

## 2024-10-14 NOTE — Progress Notes (Signed)
 " PROGRESS NOTE    Linda Washington  FMW:[id number] DOB: May 20, 1951 DOA: 10/08/2024 PCP: Katina Pfeiffer, PA-C   Brief Narrative:  Patient 73 year old female history of anxiety, GERD, history of allergies and previous anaphylactic reactions, who presented with hives, swelling, respiratory distress followed by witnessed syncope after eating pancakes and jelly.  Patient received epi, Benadryl .  Patient with low BP subsequently admitted to [facility].  Patient placed on epi gtt., Benadryl , Pepcid , steroids with clinical improvement.  Patient also noted to have a left displaced fibular fracture and seen in consultation by orthopedics who recommended splint placement, nonweightbearing with outpatient follow-up.  Given new onset ambulatory dysfunction patient is currently awaiting placement at SNF, remains medically stable for discharge.  Assessment & Plan:   Principal Problem:   Anaphylaxis Active Problems:   Diabetes mellitus (HCC)   Essential hypertension   Dyslipidemia   OSA (obstructive sleep apnea)   Atrial flutter (HCC)   Acquired hypothyroidism   Chronic anticoagulation   Gastroesophageal reflux disease   Mixed anxiety and depressive disorder   Tibia/fibula fracture, left, closed, initial encounter   AKI (acute kidney injury)   Syncope secondary to anaphylactic shock as below, POA, resolved Mechanical fall with trauma Left fibular fracture - Confirmed on imaging - Orthopedics fixation of left fibular fracture 12/27 - Eliquis  currently on hold perioperatively restart 12/28 if no bleeding/complications -Heparin  drip in the interim  Anaphylactic shock, presumably secondary to grape jelly, POA, resolved - Initially requiring epinephrine  drip now discontinued - Prednisone  course to complete 12/26, Pepcid  ongoing -Recommend further outpatient evaluation by allergist given other known comorbid allergic reactions.   GERD -PPI ongoing   Insulin -dependent diabetes mellitus,  well-controlled -Hemoglobin A1c noted at 6.8 (09/29/2024) - Patient noted to be on Trulicity 1.5 mg q. weekly, Jardiance  25 mg daily, Lantus 10 units daily, metformin  1000 mg twice daily. - CBG noted at 100 this morning. - Continue SSI, hypoglycemic protocol   Hypertension/hyperlipidemia - Continue statin. - Resume home regimen Coreg .  Sick sinus syndrome status post PPM/A-fib on anticoagulation, stable - Well controlled on carvedilol /amiodarone , sinus rhythm - Apixaban  for anticoagulation   Reported hypothyroidism - TSH remains within normal limits per chart review going back 6 years - Patient is not on any supplementation, unclear if truly hypothyroid  AKI, resolved - Renal function improved.  Sacral moisture associated skin damage(MASD) versus pressure injury, POA Patient seen by wound care RN. Wound 10/08/24 1858 Pressure Injury Sacrum Mid Deep Tissue Pressure Injury - Purple or maroon localized area of discolored intact skin or blood-filled blister due to damage of underlying soft tissue from pressure and/or shear. (Active)    DVT prophylaxis: SCDs Start: 10/08/24 1521 apixaban  (ELIQUIS ) tablet 5 mg  Code Status:   Code Status: Full Code Family Communication: None present  Status is: Inpatient  Dispo: The patient is from: Home              Anticipated d/c is to: SNF              Anticipated d/c date is: 24h              Patient currently will be medically stable for discharge once cleared postoperatively by ortho  Consultants:  Orthopedic surgery, PCCM  Procedures:  L fibular ORIF 12/27  Antimicrobials:  None  Subjective: No acute issues or events overnight denies nausea vomiting diarrhea constipation headache fevers chills or chest pain.  Pain currently well-controlled postoperatively   Objective: Vitals:   10/14/24 0945 10/14/24 1000  10/14/24 1015 10/14/24 1025  BP: 113/61 (!) 118/56 (!) 117/55 127/64  Pulse: 60 60 60 60  Resp: 19 14 18 18   Temp:   98.2  F (36.8 C) 98.2 F (36.8 C)  TempSrc:    Oral  SpO2: 96% 93% 96% 99%  Weight:      Height:        Intake/Output Summary (Last 24 hours) at 10/14/2024 1201 Last data filed at 10/14/2024 0933 Gross per 24 hour  Intake 1370.85 ml  Output 2550 ml  Net -1179.15 ml   Filed Weights   10/12/24 0500 10/13/24 0528 10/14/24 0320  Weight: 131.3 kg 127.2 kg 125.1 kg    Examination:  General:  Pleasantly resting in bed, No acute distress. HEENT:  Normocephalic atraumatic.  Sclerae nonicteric, noninjected.  Extraocular movements intact bilaterally. Neck:  Without mass or deformity.  Trachea is midline. Lungs:  Clear to auscultate bilaterally without rhonchi, wheeze, or rales. Heart:  Regular rate and rhythm.  Without murmurs, rubs, or gallops. Abdomen:  Soft, nontender, nondistended.  Without guarding or rebound. Extremities: Left leg bandage clean dry intact Skin:  Warm and dry, discoloration of the sacrum concerning for pressure injury: Wound 10/08/24 1858 Pressure Injury Sacrum Mid Deep Tissue Pressure Injury - Purple or maroon localized area of discolored intact skin or blood-filled blister due to damage of underlying soft tissue from pressure and/or shear. (Active)    Data Reviewed: I have personally reviewed following labs and imaging studies  CBC: Recent Labs  Lab 10/08/24 1438 10/09/24 0252 10/10/24 1028 10/11/24 0257 10/14/24 0407  WBC 8.8 21.4* 10.5 10.4 12.4*  NEUTROABS 6.5  --  7.5  --   --   HGB 15.8* 14.1 11.2* 11.7* 11.6*  HCT 50.5* 42.8 34.0* 34.6* 35.0*  MCV 96.4 92.8 91.9 90.8 90.2  PLT 337 293 276 286 351   Basic Metabolic Panel: Recent Labs  Lab 10/08/24 1844 10/09/24 0252 10/10/24 1028 10/11/24 0257 10/14/24 0407  NA 141 136 140 139 140  K 3.9 4.7 3.8 3.8 3.4*  CL 105 103 107 106 105  CO2 17* 17* 24 24 27   GLUCOSE 303* 163* 117* 115* 117*  BUN 18 19 18 20 16   CREATININE 1.23* 1.06* 0.81 0.79 0.79  CALCIUM  9.2 8.7* 8.4* 8.7* 8.7*  MG  --  1.7  1.8  --   --   PHOS  --  4.3  --   --   --    GFR: Estimated Creatinine Clearance: 82.1 mL/min (by C-G formula based on SCr of 0.79 mg/dL).  Cardiac Enzymes: Recent Labs  Lab 10/08/24 1844  CKTOTAL 115   CBG: Recent Labs  Lab 10/13/24 2210 10/14/24 0514 10/14/24 0720 10/14/24 0930 10/14/24 1130  GLUCAP 159* 104* 110* 143* 175*   Sepsis Labs: Recent Labs  Lab 10/08/24 1452  LATICACIDVEN 6.8*    Recent Results (from the past 240 hours)  MRSA Next Gen by PCR, Nasal     Status: None   Collection Time: 10/08/24  6:42 PM   Specimen: Nasal Mucosa; Nasal Swab  Result Value Ref Range Status   MRSA by PCR Next Gen NOT DETECTED NOT DETECTED Final    Comment: (NOTE) The GeneXpert MRSA Assay (FDA approved for NASAL specimens only), is one component of a comprehensive MRSA colonization surveillance program. It is not intended to diagnose MRSA infection nor to guide or monitor treatment for MRSA infections. Test performance is not FDA approved in patients less than 70 years old. Performed  at [facility], [address] [place], [place] [address]   Surgical pcr screen     Status: None   Collection Time: 10/13/24  8:57 PM   Specimen: Nasal Mucosa; Nasal Swab  Result Value Ref Range Status   MRSA, PCR NEGATIVE NEGATIVE Final   Staphylococcus aureus NEGATIVE NEGATIVE Final    Comment: (NOTE) The Xpert SA Assay (FDA approved for NASAL specimens in patients 69 years of age and older), is one component of a comprehensive surveillance program. It is not intended to diagnose infection nor to guide or monitor treatment. Performed at [facility], [address], [place], [place] [address]          Radiology Studies: DG MINI C-ARM IMAGE ONLY Result Date: 10/14/2024 There is no interpretation for this exam.  This order is for images obtained during a surgical procedure.  Please See Surgeries Tab for more information regarding the procedure.   CT ANKLE LEFT WO  CONTRAST Result Date: 10/13/2024 CLINICAL DATA:  Ankle fracture evaluation. EXAM: CT OF THE LEFT ANKLE WITHOUT CONTRAST TECHNIQUE: Multidetector CT imaging of the left ankle was performed according to the standard protocol. Multiplanar CT image reconstructions were also generated. RADIATION DOSE REDUCTION: This exam was performed according to the departmental dose-optimization program which includes automated exposure control, adjustment of the mA and/or kV according to patient size and/or use of iterative reconstruction technique. COMPARISON:  Left ankle radiographs dated 10/09/2024. FINDINGS: Bones/Joint/Cartilage Redemonstrated acute oblique transsyndesmotic fracture of the distal fibular metaphysis with approximately 5 mm of posterior displacement of the distal fracture fragment. Comminuted osseous fragments noted within the fracture defect. Suspect mild asymmetric widening along the lateral aspect of the tibiotalar joint. Punctate linear osseous fragment adjacent to the tip of the medial malleolus is suspicious for ligamentous injury. No additional fracture identified. Mild degenerative changes throughout the foot with subcortical cystic changes at the base of the second and third metatarsals. Calcaneal enthesopathy at the insertion of the Achilles tendon and the origin of the central cord of the plantar fascia. Ligaments Ligaments are suboptimally evaluated by CT. Muscles and Tendons Fatty atrophy of the intrinsic foot musculature likely reflects chronic denervation changes. The Achilles tendon is intact. Soft tissue Subcutaneous edema along the lateral, anterior and medial ankle. There is a 1.4 x 1.0 by 2.2 cm subcutaneous soft tissue hematoma overlying the medial malleolus with overlying cutaneous thickening. IMPRESSION: 1. Oblique transsyndesmotic fracture of the distal fibular metaphysis with approximately 5 mm of posterior displacement of the distal fracture fragment. 2. Suspect mild asymmetric widening  along the lateral aspect of the tibiotalar joint. 3. Punctate linear osseous fragment adjacent to the tip of the medial malleolus is suspicious for ligamentous injury. 4. Subcutaneous edema along the lateral, anterior and medial ankle. 1.4 x 1.0 by 2.2 cm subcutaneous soft tissue hematoma overlying the medial malleolus with overlying cutaneous thickening. 5. Calcaneal enthesopathy. 6. Fatty atrophy of the intrinsic foot musculature likely reflects chronic denervation changes. Electronically Signed   By: Harrietta Sherry M.D.   On: 10/13/2024 14:22         Scheduled Meds:  amiodarone   200 mg Oral Daily   [START ON 10/15/2024] apixaban   5 mg Oral BID   atorvastatin   40 mg Oral QHS   carvedilol   6.25 mg Oral BID WC   Chlorhexidine  Gluconate Cloth  6 each Topical Daily   cyanocobalamin   1,000 mcg Oral Daily   DULoxetine   30 mg Oral Daily   famotidine   20 mg Oral BID   insulin  aspart  0-15 Units Subcutaneous TID WC   insulin  aspart  0-5 Units Subcutaneous QHS   oxybutynin   15 mg Oral Daily   Continuous Infusions:   ceFAZolin  (ANCEF ) IV       LOS: 6 days   Time spent:  Elsie JAYSON Montclair, DO [facility]  If 7PM-7AM, please contact night-coverage [email or web address]  10/14/2024, 12:01 PM      "

## 2024-10-14 NOTE — Progress Notes (Signed)
 Subjective:  Patient reports pain as appropriately controlled. Denies any new numbness/tingling.   Objective:   VITALS:  Temp:  [97.5 F (36.4 C)-98.2 F (36.8 C)] 97.9 F (36.6 C) (12/27 0639) Pulse Rate:  [59-63] 63 (12/27 0639) Resp:  [16-22] 18 (12/27 0639) BP: (114-149)/(55-94) 114/94 (12/27 0639) SpO2:  [95 %-98 %] 95 % (12/27 0639) Weight:  [125.1 kg] 125.1 kg (12/27 0320)  Neurologically intact Neurovascular intact Sensation & motor grossly intact distally Intact pulses distally Compartment soft   LABS Recent Labs    10/14/24 0407  HGB 11.6*  WBC 12.4*  PLT 351   Recent Labs    10/14/24 0407  NA 140  K 3.4*  CL 105  CO2 27  BUN 16  CREATININE 0.79  GLUCOSE 117*   No results for input(s): LABPT, INR in the last 72 hours.   Assessment/Plan: * Day of Surgery * Procedures (LRB): OPEN REDUCTION INTERNAL FIXATION (ORIF) ANKLE FRACTURE (Left)  -history and imaging reviewed with pt -pt has left bimalleolar equivalent ankle fx with suspected syndesmosis disruption -PMH includes anxiety, GERD, history of allergies and previous anaphylactic reaction, heart failure, therapeutic anticoagulation (Eliquis ) -kept NPO and held Eliquis  for OR 10/14/24 AM -elevated heel above hip, ice application -CT left ankle reviewed -strict NWB LLE -PT/OT postop with likely SNF placement  Eri Mcevers 10/14/2024, 7:40 AM

## 2024-10-14 NOTE — Anesthesia Postprocedure Evaluation (Signed)
"   Anesthesia Post Note  Patient: Linda Washington  Procedure(s) Performed: OPEN REDUCTION INTERNAL FIXATION (ORIF) ANKLE FRACTURE (Left: Ankle)     Patient location during evaluation: PACU Anesthesia Type: General and Regional Level of consciousness: awake and alert Pain management: pain level controlled Vital Signs Assessment: post-procedure vital signs reviewed and stable Respiratory status: spontaneous breathing, nonlabored ventilation and respiratory function stable Cardiovascular status: blood pressure returned to baseline and stable Postop Assessment: no apparent nausea or vomiting Anesthetic complications: no   No notable events documented.  Last Vitals:  Vitals:   10/14/24 1015 10/14/24 1025  BP: (!) 117/55 127/64  Pulse: 60 60  Resp: 18 18  Temp: 36.8 C 36.8 C  SpO2: 96% 99%    Last Pain:  Vitals:   10/14/24 1025  TempSrc: Oral  PainSc: 0-No pain                 Oralee Rapaport,W. EDMOND      "

## 2024-10-14 NOTE — Progress Notes (Signed)
 Arrived back to [facility] 3E11 after PACU.  Alert and oriented.  Son at bedside updated.  Handoff received from PACU RN Delon.     10/14/24 1025  Vitals  Temp 98.2 F (36.8 C)  Temp Source Oral  BP 127/64  MAP (mmHg) 78  BP Location Right Arm  BP Method Automatic  Patient Position (if appropriate) Lying  Pulse Rate 60  Pulse Rate Source Monitor  ECG Heart Rate 60  Resp 18  Level of Consciousness  Level of Consciousness Alert  MEWS COLOR  MEWS Score Color Green  Oxygen  Therapy  SpO2 99 %  O2 Device Room Air  Pain Assessment  Pain Scale 0-10  Pain Score 0  MEWS Score  MEWS Temp 0  MEWS Systolic 0  MEWS Pulse 0  MEWS RR 0  MEWS LOC 0  MEWS Score 0

## 2024-10-14 NOTE — Anesthesia Procedure Notes (Addendum)
 Procedure Name: Intubation Date/Time: 10/14/2024 8:29 AM  Performed by: Arvell Edsel HERO, CRNAPre-anesthesia Checklist: Patient identified, Emergency Drugs available, Suction available and Patient being monitored Patient Re-evaluated:Patient Re-evaluated prior to induction Oxygen  Delivery Method: Circle System Utilized Preoxygenation: Pre-oxygenation with 100% oxygen  Induction Type: IV induction Ventilation: Mask ventilation without difficulty Laryngoscope Size: Glidescope and 3 Grade View: Grade I Tube type: Oral Tube size: 7.0 mm Number of attempts: 1 Airway Equipment and Method: Stylet Placement Confirmation: ETT inserted through vocal cords under direct vision, positive ETCO2 and breath sounds checked- equal and bilateral Secured at: 21 cm Tube secured with: Tape Dental Injury: Teeth and Oropharynx as per pre-operative assessment

## 2024-10-14 NOTE — Anesthesia Procedure Notes (Signed)
 Anesthesia Regional Block: Adductor canal block   Pre-Anesthetic Checklist: , timeout performed,  Correct Patient, Correct Site, Correct Laterality,  Correct Procedure, Correct Position, site marked,  Risks and benefits discussed,  Pre-op  evaluation,  At surgeon's request and post-op pain management  Laterality: Left  Prep: Maximum Sterile Barrier Precautions used, chloraprep       Needles:  Injection technique: Single-shot  Needle Type: Echogenic Stimulator Needle     Needle Length: 9cm  Needle Gauge: 21     Additional Needles:   Procedures:,,,, ultrasound used (permanent image in chart),,    Narrative:  Start time: 10/14/2024 8:03 AM End time: 10/14/2024 8:06 AM Injection made incrementally with aspirations every 5 mL.  Performed by: Personally  Anesthesiologist: Epifanio Fallow, MD

## 2024-10-14 NOTE — Anesthesia Procedure Notes (Signed)
 Anesthesia Regional Block: Popliteal block   Pre-Anesthetic Checklist: , timeout performed,  Correct Patient, Correct Site, Correct Laterality,  Correct Procedure, Correct Position, site marked,  Risks and benefits discussed,  Pre-op  evaluation,  At surgeon's request and post-op pain management  Laterality: Left  Prep: Maximum Sterile Barrier Precautions used, chloraprep       Needles:  Injection technique: Single-shot  Needle Type: Echogenic Stimulator Needle     Needle Length: 9cm  Needle Gauge: 21     Additional Needles:   Procedures:,,,, ultrasound used (permanent image in chart),,    Narrative:  Start time: 10/14/2024 7:53 AM End time: 10/14/2024 8:03 AM Injection made incrementally with aspirations every 5 mL.  Performed by: Personally  Anesthesiologist: Epifanio Fallow, MD

## 2024-10-14 NOTE — H&P (Signed)
 H&P Update:  -History and Physical Reviewed  -Patient has been re-examined  -No change in the plan of care  -The risks and benefits were presented and reviewed. The risks due to hardware/suture failure and/or irritation (if removing hardware: inability to remove part/all of hardware, recurrent instability), new/persistent infection, stiffness, nerve/vessel/tendon injury or rerupture of repaired tendon, nonunion/malunion, allograft usage, wound healing issues, development of arthritis, failure of this surgery, possibility of external fixation with delayed definitive surgery, need for further surgery, thromboembolic events, anesthesia/medical complications, amputation, death among others were discussed. The patient acknowledged the explanation, agreed to proceed with the plan and a consent was signed.  Zell Doucette\u

## 2024-10-14 NOTE — Op Note (Addendum)
 10/14/2024  9:26 AM   PATIENT: Linda Washington  73 y.o. female  MRN: [id number]   PRE-OPERATIVE DIAGNOSIS:   Left trimalleolar ankle fracture with syndesmosis disruption   POST-OPERATIVE DIAGNOSIS:   Same   PROCEDURE: 1] Left distal fibula ORIF 2] Left ankle syndesmosis ORIF   SURGEON:  Lillia Mountain, MD   ASSISTANT: None   ANESTHESIA: General, regional   EBL: Minimal   TOURNIQUET:    Total Tourniquet Time Documented: Thigh (Left) - 34 minutes Total: Thigh (Left) - 34 minutes    COMPLICATIONS: None apparent   DISPOSITION: Extubated, awake and stable to recovery.   INDICATION FOR PROCEDURE: The patient presented with above diagnosis.  We discussed the diagnosis, alternative treatment options, risks and benefits of the above surgical intervention, as well as alternative non-operative treatments. All questions/concerns were addressed and the patient/family demonstrated appropriate understanding of the diagnosis, the procedure, the postoperative course, and overall prognosis. The patient wished to proceed with surgical intervention and signed an informed surgical consent as such, in each others presence prior to surgery.   PROCEDURE IN DETAIL: After preoperative consent was obtained and the correct operative site was identified, the patient was brought to the operating room supine on stretcher. General anesthesia was induced. Preoperative antibiotics were administered. Surgical timeout was taken. The patient was then positioned supine. The operative lower extremity was prepped and draped in standard sterile fashion with a tourniquet around the thigh. The extremity was exsanguinated and the tourniquet was inflated to 275 mmHg.  A standard lateral incision was made over the distal fibula. Dissection was carried down to the level of the fibula and the fracture site identified. The superficial peroneal nerve was identified and protected throughout the procedure.  The fibula was noted to be shortened with interposed periosteum. The fibula was brought out to length. The fibula fracture was debrided and the edges defined to achieve cortical read. Reduction maneuver was performed using pointed reduction forceps and lobster forceps. In this manner, the fibula length was restored and fracture reduced. A lag screw was not placed given the orientation of fracture lines and comminution. Due to poor bone quality and extensive comminution at the fracture site, it was decided to use a locking distal fibula plate. We then selected a Zimmer locking plate to match the anatomy of the distal fibula and placed it laterally. This was implanted under intraoperative fluoroscopy with a combination of distal locking screws and proximal cortical & locking screws.  A manual external rotation stress radiograph was obtained and demonstrated widening of the ankle mortise. Given this intraoperative finding as well as preoperative subluxation, it was decided to reduce and fix the syndesmosis. Therefore a nonlocking quadricortical hex head 4.0 mm screw was implanted through the fibula plate in appropriate fashion to fix the syndesmosis. Screw position was verified along anteromedial tibial cortex by fluoroscopy. A repeat stress radiograph showed complete stability of the ankle mortise to testing.  Both the posterior malleolus and medial malleolus avulsion fractures were deemed too minor to receive internal fixation and were verified to be in anatomic position on fluoroscopy.  The surgical sites were thoroughly irrigated. The tourniquet was deflated and hemostasis achieved. The deep layers were closed using 2-0 vicryl. The skin was closed without tension.    The leg was cleaned with saline and sterile dressings with gauze were applied. Well padded bulky dressings and wrap were applied. The patient was awakened from anesthesia and transported to the recovery room in stable condition.  FOLLOW UP  PLAN: -transfer to PACU, then return to RNF -strict NWB operative extremity, maximum elevation -postop IV abx ordered -pain rx printed and placed in chart -maintain short leg splint until follow up -DVT ppx: resume Eliquis  anytime per primary team discretion -follow up as outpatient within 7-10 days for wound check -sutures out in 2-3 weeks in outpatient office   RADIOGRAPHS: AP, lateral, oblique and stress radiographs of the left ankle were obtained intraoperatively. These showed interval reduction and fixation of the fractures. Manual stress radiographs were taken and the joints were noted to be stable following procedure. All hardware is appropriately positioned and of the appropriate lengths. No other acute injuries are noted.   Lillia Mountain [facility]

## 2024-10-14 NOTE — Progress Notes (Signed)
 Subjective:  Patient reports pain as appropriately controlled. Denies any new numbness/tingling.   Objective:   VITALS:  Temp:  [97.5 F (36.4 C)-98.6 F (37 C)] 97.8 F (36.6 C) (12/27 1904) Pulse Rate:  [59-72] 72 (12/27 1904) Resp:  [13-20] 18 (12/27 1904) BP: (111-149)/(53-94) 126/61 (12/27 1904) SpO2:  [91 %-99 %] 98 % (12/27 1904) Weight:  [125.1 kg] 125.1 kg (12/27 0320)  Gen: AAOx3, NAD  Left lower extremity: Well padded short leg splint in place Wiggles toes SILT over toes CR<2s    LABS Recent Labs    10/14/24 0407  HGB 11.6*  WBC 12.4*  PLT 351   Recent Labs    10/14/24 0407  NA 140  K 3.4*  CL 105  CO2 27  BUN 16  CREATININE 0.79  GLUCOSE 117*   No results for input(s): LABPT, INR in the last 72 hours.   Assessment/Plan: * Day of Surgery * Procedures (LRB): OPEN REDUCTION INTERNAL FIXATION (ORIF) ANKLE FRACTURE (Left)  -stable on RNF -strict NWB operative extremity, maximum elevation -postop IV abx ordered -pain rx printed and placed in chart -maintain short leg splint until follow up -DVT ppx: resume Eliquis  anytime per primary team discretion -follow up as outpatient within 7-10 days for wound check -sutures out in 2-3 weeks in outpatient office  Lillia Mountain 10/14/2024, 10:28 PM

## 2024-10-15 DIAGNOSIS — N179 Acute kidney failure, unspecified: Secondary | ICD-10-CM | POA: Diagnosis not present

## 2024-10-15 DIAGNOSIS — I4892 Unspecified atrial flutter: Secondary | ICD-10-CM | POA: Diagnosis not present

## 2024-10-15 DIAGNOSIS — S82202A Unspecified fracture of shaft of left tibia, initial encounter for closed fracture: Secondary | ICD-10-CM | POA: Diagnosis not present

## 2024-10-15 DIAGNOSIS — T7804XA Anaphylactic reaction due to fruits and vegetables, initial encounter: Secondary | ICD-10-CM | POA: Diagnosis not present

## 2024-10-15 LAB — GLUCOSE, CAPILLARY
Glucose-Capillary: 139 mg/dL — ABNORMAL HIGH (ref 70–99)
Glucose-Capillary: 127 mg/dL — ABNORMAL HIGH (ref 70–99)
Glucose-Capillary: 168 mg/dL — ABNORMAL HIGH (ref 70–99)
Glucose-Capillary: 180 mg/dL — ABNORMAL HIGH (ref 70–99)

## 2024-10-15 MED ORDER — IBUPROFEN 200 MG PO TABS: 800.0000 mg | ORAL_TABLET | Freq: Three times a day (TID) | ORAL | Status: DC | PRN

## 2024-10-15 MED ORDER — OXYCODONE HCL 5 MG PO TABS
5.0000 mg | ORAL_TABLET | ORAL | Status: DC | PRN
  Administered 2024-10-15 – 2024-10-16 (×4): 5 mg via ORAL
  Filled 2024-10-15: qty 1

## 2024-10-15 MED ORDER — HYDROMORPHONE HCL 1 MG/ML IJ SOLN
0.5000 mg | INTRAMUSCULAR | Status: DC | PRN
  Administered 2024-10-16: 0.5 mg via INTRAVENOUS

## 2024-10-15 NOTE — Plan of Care (Signed)

## 2024-10-15 NOTE — Progress Notes (Signed)
 Patient ID: Linda Washington, female   DOB: 09-26-1951, 73 y.o.   MRN: [id number]  Subjective: 1 Day Post-Op Procedures (LRB): OPEN REDUCTION INTERNAL FIXATION (ORIF) ANKLE FRACTURE (Left)    Patient reports pain as mild. Doing well, no events, up eating breakfast this morning  Objective:   VITALS:   Vitals:   10/15/24 0725 10/15/24 0727  BP:  (!) 126/51  Pulse:  60  Resp:  16  Temp: 98.3 F (36.8 C) 98.3 F (36.8 C)  SpO2:  98%    Neurovascular intact Incision: dressing C/D/I, left LE splint dry and intact Moves toes well and has intact sensibility  LABS Recent Labs    10/14/24 0407  HGB 11.6*  HCT 35.0*  WBC 12.4*  PLT 351    Recent Labs    10/14/24 0407  NA 140  K 3.4*  BUN 16  CREATININE 0.79  GLUCOSE 117*    No results for input(s): LABPT, INR in the last 72 hours.   Assessment/Plan: 1 Day Post-Op Procedures (LRB): OPEN REDUCTION INTERNAL FIXATION (ORIF) ANKLE FRACTURE (Left)   Advance diet Up with therapy, NWB LLE Disposition per primary team pending PT evaluation and recommendations Follow up with Ramanathan in 2 weeks

## 2024-10-15 NOTE — Progress Notes (Signed)
 " PROGRESS NOTE    Linda Washington  FMW:[id number] DOB: 01/31/1951 DOA: 10/08/2024 PCP: Katina Pfeiffer, PA-C   Brief Narrative:  Patient 73 year old female history of anxiety, GERD, history of allergies and previous anaphylactic reactions, who presented with hives, swelling, respiratory distress followed by witnessed syncope after eating pancakes and jelly.  Patient received epi, Benadryl .  Patient with low BP subsequently admitted to [facility].  Patient placed on epi gtt., Benadryl , Pepcid , steroids with clinical improvement.  Patient also noted to have a left displaced fibular fracture and seen in consultation by orthopedics who recommended splint placement, nonweightbearing with outpatient follow-up.  Given new onset ambulatory dysfunction patient is currently awaiting placement at SNF, remains medically stable for discharge.  Assessment & Plan:   Principal Problem:   Anaphylaxis Active Problems:   Diabetes mellitus (HCC)   Essential hypertension   Dyslipidemia   OSA (obstructive sleep apnea)   Atrial flutter (HCC)   Acquired hypothyroidism   Chronic anticoagulation   Gastroesophageal reflux disease   Mixed anxiety and depressive disorder   Tibia/fibula fracture, left, closed, initial encounter   AKI (acute kidney injury)  Syncope secondary to anaphylactic shock as below, POA, resolved Mechanical fall with trauma Left fibular fracture - Confirmed on imaging - Orthopedics fixation of left fibular fracture 12/27 -left distal fibula and left ankle syndrome versus ORIF tolerated well - Restart Eliquis   Anaphylactic shock, presumably secondary to grape jelly, POA, resolved - Initially requiring epinephrine  drip now discontinued - Prednisone  course to complete 12/26, Pepcid  ongoing - Recommend further outpatient evaluation by allergist given other known comorbid allergic reactions.   GERD -PPI ongoing   Insulin -dependent diabetes mellitus, well-controlled -Hemoglobin A1c noted  at 6.8 (09/29/2024) - Patient noted to be on Trulicity 1.5 mg q. weekly, Jardiance  25 mg daily, Lantus 10 units daily, metformin  1000 mg twice daily. - CBG noted at 100 this morning. - Continue SSI, hypoglycemic protocol   Hypertension/hyperlipidemia - Continue statin. - Resume home regimen Coreg .  Sick sinus syndrome status post PPM/A-fib on anticoagulation, stable - Well controlled on carvedilol /amiodarone , sinus rhythm - Apixaban  for anticoagulation   Reported hypothyroidism - TSH remains within normal limits per chart review going back 6 years - Patient is not on any supplementation, unclear if truly hypothyroid  AKI, resolved - Renal function improved.  Sacral moisture associated skin damage(MASD) versus pressure injury, POA Patient seen by wound care RN. Wound 10/08/24 1858 Pressure Injury Sacrum Mid Deep Tissue Pressure Injury - Purple or maroon localized area of discolored intact skin or blood-filled blister due to damage of underlying soft tissue from pressure and/or shear. (Active)    DVT prophylaxis: SCDs Start: 10/08/24 1521 apixaban  (ELIQUIS ) tablet 5 mg  Code Status:   Code Status: Full Code Family Communication: None present  Status is: Inpatient  Dispo: The patient is from: Home              Anticipated d/c is to: SNF              Anticipated d/c date is: 24h              Patient currently will be medically stable for discharge once cleared postoperatively by ortho  Consultants:  Orthopedic surgery, PCCM  Procedures:  L fibular ORIF 12/27  Antimicrobials:  None  Subjective: No acute issues or events overnight denies nausea vomiting diarrhea constipation headache fevers chills or chest pain.  Pain currently well-controlled  Objective: Vitals:   10/14/24 1904 10/15/24 0000 10/15/24 0407 10/15/24 9483  BP: 126/61 (!) 121/57  (!) 126/51  Pulse: 72 60  60  Resp: 18   17  Temp: 97.8 F (36.6 C) 97.9 F (36.6 C)  97.6 F (36.4 C)  TempSrc: Oral Oral   Oral  SpO2: 98% 97%  97%  Weight:   125.2 kg   Height:        Intake/Output Summary (Last 24 hours) at 10/15/2024 0713 Last data filed at 10/15/2024 0000 Gross per 24 hour  Intake 1180 ml  Output 1575 ml  Net -395 ml   Filed Weights   10/13/24 0528 10/14/24 0320 10/15/24 0407  Weight: 127.2 kg 125.1 kg 125.2 kg    Examination:  General:  Pleasantly resting in bed, No acute distress. HEENT:  Normocephalic atraumatic.  Sclerae nonicteric, noninjected.  Extraocular movements intact bilaterally. Neck:  Without mass or deformity.  Trachea is midline. Lungs:  Clear to auscultate bilaterally without rhonchi, wheeze, or rales. Heart:  Regular rate and rhythm.  Without murmurs, rubs, or gallops. Abdomen:  Soft, nontender, nondistended.  Without guarding or rebound. Extremities: Left leg bandage clean dry intact Skin:  Warm and dry, discoloration of the sacrum concerning for pressure injury: Wound 10/08/24 1858 Pressure Injury Sacrum Mid Deep Tissue Pressure Injury - Purple or maroon localized area of discolored intact skin or blood-filled blister due to damage of underlying soft tissue from pressure and/or shear. (Active)    Data Reviewed: I have personally reviewed following labs and imaging studies  CBC: Recent Labs  Lab 10/08/24 1438 10/09/24 0252 10/10/24 1028 10/11/24 0257 10/14/24 0407  WBC 8.8 21.4* 10.5 10.4 12.4*  NEUTROABS 6.5  --  7.5  --   --   HGB 15.8* 14.1 11.2* 11.7* 11.6*  HCT 50.5* 42.8 34.0* 34.6* 35.0*  MCV 96.4 92.8 91.9 90.8 90.2  PLT 337 293 276 286 351   Basic Metabolic Panel: Recent Labs  Lab 10/08/24 1844 10/09/24 0252 10/10/24 1028 10/11/24 0257 10/14/24 0407  NA 141 136 140 139 140  K 3.9 4.7 3.8 3.8 3.4*  CL 105 103 107 106 105  CO2 17* 17* 24 24 27   GLUCOSE 303* 163* 117* 115* 117*  BUN 18 19 18 20 16   CREATININE 1.23* 1.06* 0.81 0.79 0.79  CALCIUM  9.2 8.7* 8.4* 8.7* 8.7*  MG  --  1.7 1.8  --   --   PHOS  --  4.3  --   --   --     GFR: Estimated Creatinine Clearance: 82.2 mL/min (by C-G formula based on SCr of 0.79 mg/dL).  Cardiac Enzymes: Recent Labs  Lab 10/08/24 1844  CKTOTAL 115   CBG: Recent Labs  Lab 10/14/24 0930 10/14/24 1130 10/14/24 1636 10/14/24 2106 10/15/24 0612  GLUCAP 143* 175* 217* 136* 139*   Sepsis Labs: Recent Labs  Lab 10/08/24 1452  LATICACIDVEN 6.8*    Recent Results (from the past 240 hours)  MRSA Next Gen by PCR, Nasal     Status: None   Collection Time: 10/08/24  6:42 PM   Specimen: Nasal Mucosa; Nasal Swab  Result Value Ref Range Status   MRSA by PCR Next Gen NOT DETECTED NOT DETECTED Final    Comment: (NOTE) The GeneXpert MRSA Assay (FDA approved for NASAL specimens only), is one component of a comprehensive MRSA colonization surveillance program. It is not intended to diagnose MRSA infection nor to guide or monitor treatment for MRSA infections. Test performance is not FDA approved in patients less than 60 years old. Performed at  [facility], [address] [place], [place] [address]   Surgical pcr screen     Status: None   Collection Time: 10/13/24  8:57 PM   Specimen: Nasal Mucosa; Nasal Swab  Result Value Ref Range Status   MRSA, PCR NEGATIVE NEGATIVE Final   Staphylococcus aureus NEGATIVE NEGATIVE Final    Comment: (NOTE) The Xpert SA Assay (FDA approved for NASAL specimens in patients 69 years of age and older), is one component of a comprehensive surveillance program. It is not intended to diagnose infection nor to guide or monitor treatment. Performed at [facility], [address], [place], [place] [address]          Radiology Studies: DG MINI C-ARM IMAGE ONLY Result Date: 10/14/2024 There is no interpretation for this exam.  This order is for images obtained during a surgical procedure.  Please See Surgeries Tab for more information regarding the procedure.   CT ANKLE LEFT WO CONTRAST Result Date: 10/13/2024 CLINICAL  DATA:  Ankle fracture evaluation. EXAM: CT OF THE LEFT ANKLE WITHOUT CONTRAST TECHNIQUE: Multidetector CT imaging of the left ankle was performed according to the standard protocol. Multiplanar CT image reconstructions were also generated. RADIATION DOSE REDUCTION: This exam was performed according to the departmental dose-optimization program which includes automated exposure control, adjustment of the mA and/or kV according to patient size and/or use of iterative reconstruction technique. COMPARISON:  Left ankle radiographs dated 10/09/2024. FINDINGS: Bones/Joint/Cartilage Redemonstrated acute oblique transsyndesmotic fracture of the distal fibular metaphysis with approximately 5 mm of posterior displacement of the distal fracture fragment. Comminuted osseous fragments noted within the fracture defect. Suspect mild asymmetric widening along the lateral aspect of the tibiotalar joint. Punctate linear osseous fragment adjacent to the tip of the medial malleolus is suspicious for ligamentous injury. No additional fracture identified. Mild degenerative changes throughout the foot with subcortical cystic changes at the base of the second and third metatarsals. Calcaneal enthesopathy at the insertion of the Achilles tendon and the origin of the central cord of the plantar fascia. Ligaments Ligaments are suboptimally evaluated by CT. Muscles and Tendons Fatty atrophy of the intrinsic foot musculature likely reflects chronic denervation changes. The Achilles tendon is intact. Soft tissue Subcutaneous edema along the lateral, anterior and medial ankle. There is a 1.4 x 1.0 by 2.2 cm subcutaneous soft tissue hematoma overlying the medial malleolus with overlying cutaneous thickening. IMPRESSION: 1. Oblique transsyndesmotic fracture of the distal fibular metaphysis with approximately 5 mm of posterior displacement of the distal fracture fragment. 2. Suspect mild asymmetric widening along the lateral aspect of the tibiotalar  joint. 3. Punctate linear osseous fragment adjacent to the tip of the medial malleolus is suspicious for ligamentous injury. 4. Subcutaneous edema along the lateral, anterior and medial ankle. 1.4 x 1.0 by 2.2 cm subcutaneous soft tissue hematoma overlying the medial malleolus with overlying cutaneous thickening. 5. Calcaneal enthesopathy. 6. Fatty atrophy of the intrinsic foot musculature likely reflects chronic denervation changes. Electronically Signed   By: Harrietta Sherry M.D.   On: 10/13/2024 14:22         Scheduled Meds:  amiodarone   200 mg Oral Daily   apixaban   5 mg Oral BID   atorvastatin   40 mg Oral QHS   carvedilol   6.25 mg Oral BID WC   Chlorhexidine  Gluconate Cloth  6 each Topical Daily   cyanocobalamin   1,000 mcg Oral Daily   DULoxetine   30 mg Oral Daily   famotidine   20 mg Oral BID  insulin  aspart  0-15 Units Subcutaneous TID WC   insulin  aspart  0-5 Units Subcutaneous QHS   oxybutynin   15 mg Oral Daily   Continuous Infusions:   ceFAZolin  (ANCEF ) IV 2 g (10/15/24 0026)     LOS: 7 days   Time spent:  Linda Washington [place], DO [facility]  If 7PM-7AM, please contact night-coverage [email or web address]  10/15/2024, 7:13 AM      "

## 2024-10-16 ENCOUNTER — Encounter (HOSPITAL_COMMUNITY): Payer: Self-pay | Admitting: Orthopaedic Surgery

## 2024-10-16 DIAGNOSIS — E039 Hypothyroidism, unspecified: Secondary | ICD-10-CM

## 2024-10-16 LAB — GLUCOSE, CAPILLARY
Glucose-Capillary: 139 mg/dL — ABNORMAL HIGH (ref 70–99)
Glucose-Capillary: 180 mg/dL — ABNORMAL HIGH (ref 70–99)
Glucose-Capillary: 198 mg/dL — ABNORMAL HIGH (ref 70–99)

## 2024-10-16 LAB — BASIC METABOLIC PANEL WITH GFR
Anion gap: 11 (ref 5–15)
BUN: 16 mg/dL (ref 8–23)
CO2: 24 mmol/L (ref 22–32)
Calcium: 8.7 mg/dL — ABNORMAL LOW (ref 8.9–10.3)
Chloride: 103 mmol/L (ref 98–111)
Creatinine, Ser: 0.86 mg/dL (ref 0.44–1.00)
GFR, Estimated: >60 mL/min
Glucose, Bld: 122 mg/dL — ABNORMAL HIGH (ref 70–99)
Potassium: 3.7 mmol/L (ref 3.5–5.1)
Sodium: 139 mmol/L (ref 135–145)

## 2024-10-16 LAB — CBC
HCT: 37.9 % (ref 36.0–46.0)
Hemoglobin: 12.2 g/dL (ref 12.0–15.0)
MCH: 30.3 pg (ref 26.0–34.0)
MCHC: 32.2 g/dL (ref 30.0–36.0)
MCV: 94 fL (ref 80.0–100.0)
Platelets: 349 K/uL (ref 150–400)
RBC: 4.03 MIL/uL (ref 3.87–5.11)
RDW: 14.1 % (ref 11.5–15.5)
WBC: 12.5 K/uL — ABNORMAL HIGH (ref 4.0–10.5)
nRBC: 0 % (ref 0.0–0.2)

## 2024-10-16 MED ORDER — FLUCONAZOLE 150 MG PO TABS
150.0000 mg | ORAL_TABLET | Freq: Once | ORAL | Status: AC
  Administered 2024-10-16: 150 mg via ORAL
  Filled 2024-10-16: qty 1

## 2024-10-16 MED ORDER — MICONAZOLE NITRATE 2 % EX POWD: CUTANEOUS | 0 refills | Status: AC | PRN

## 2024-10-16 NOTE — Progress Notes (Addendum)
 Reviewed AVS with patient knws going to [facility]  AVS in packet also for discharge

## 2024-10-16 NOTE — Significant Event (Signed)
 Patient and son has been waiting for a Boot and scooter than Ortho PA said would be delivered per Patient. There was no Note to reference for today's visit, so I reached out to [facility] office to reach PA and MD for Orders and clarification on the equipment needed.  Patient does not need equipment before going to [facility] for Rehab.

## 2024-10-16 NOTE — Progress Notes (Signed)
 Physical Therapy Treatment Patient Details Name: Linda Washington MRN: [id number] DOB: July 18, 1951 Today's Date: 10/16/2024   History of Present Illness 73 yo F adm 10/08/24 with hives, edema, anaphylaxis, syncope after eating pancakes, hypotension and Lt ankle fx. NWB LLE. PMH: 10/02/24 lap chole with hernia repair. DM, HLD, Aflutter on Coumadin , HTN, CHF, neuropathy, sleep apnea, [place] , anxiety, depression    PT Comments  Pt in bed and initially agreeable to OOB mobility, however DO arrived with RN and informed patient that she would discharge today to rehab with additional discussion regarding current medical status. Pt then declined OOB mobility secondary to transferring to rehab. Educated pt on wound management and signs of infection, bed level HEP, safety with transfers, and pain management. Continue to recommend SNF due to home accessibility and new NWB LLE.    If plan is discharge home, recommend the following: A lot of help with walking and/or transfers;A lot of help with bathing/dressing/bathroom;Assistance with cooking/housework;Assist for transportation;Help with stairs or ramp for entrance   Can travel by private vehicle     No  Equipment Recommendations  Wheelchair cushion (measurements PT);Wheelchair (measurements PT);BSC/3in1    Recommendations for Other Services       Precautions / Restrictions Precautions Precautions: Fall;Other (comment) Recall of Precautions/Restrictions: Intact Precaution/Restrictions Comments: watch HR Restrictions Weight Bearing Restrictions Per Provider Order: Yes LLE Weight Bearing Per Provider Order: Non weight bearing     Mobility  Bed Mobility               General bed mobility comments: Pt resting in bed upon PT arrival, plan for stand pivot transfer to recliner. DO arrived, inform pt that she would be discharging today to SNF. Following conversation with DO, pt declined OOB mobility    Transfers                         Ambulation/Gait                   Stairs             Wheelchair Mobility     Tilt Bed    Modified Rankin (Stroke Patients Only)       Balance                                            Communication Communication Communication: No apparent difficulties  Cognition Arousal: Alert Behavior During Therapy: WFL for tasks assessed/performed   PT - Cognitive impairments: No apparent impairments                         Following commands: Intact      Cueing Cueing Techniques: Verbal cues  Exercises Other Exercises Other Exercises: HEP bed level exercises to maintain ROM and strength: glute set, SLR, heel slide, hip abd/add, ankle pumps R, modified sit ups    General Comments        Pertinent Vitals/Pain Pain Assessment Pain Assessment: Faces Faces Pain Scale: Hurts little more Pain Location: L ankle Pain Descriptors / Indicators: Sore, Discomfort, Grimacing, Guarding Pain Intervention(s): Monitored during session, Repositioned    Home Living                          Prior Function  PT Goals (current goals can now be found in the care plan section) Acute Rehab PT Goals Patient Stated Goal: return to walking PT Goal Formulation: With patient Time For Goal Achievement: 10/23/24 Potential to Achieve Goals: Fair Progress towards PT goals: Progressing toward goals    Frequency    Min 2X/week      PT Plan      Co-evaluation              AM-PAC PT 6 Clicks Mobility   Outcome Measure  Help needed turning from your back to your side while in a flat bed without using bedrails?: A Little Help needed moving from lying on your back to sitting on the side of a flat bed without using bedrails?: A Little Help needed moving to and from a bed to a chair (including a wheelchair)?: A Lot Help needed standing up from a chair using your arms (e.g., wheelchair or bedside chair)?: A Lot Help  needed to walk in hospital room?: Total Help needed climbing 3-5 steps with a railing? : Total 6 Click Score: 12    End of Session   Activity Tolerance: Patient tolerated treatment well Patient left: in bed;with call bell/phone within reach;with bed alarm set Nurse Communication: Mobility status;Precautions;Weight bearing status PT Visit Diagnosis: Other abnormalities of gait and mobility (R26.89);Difficulty in walking, not elsewhere classified (R26.2);Muscle weakness (generalized) (M62.81);Unsteadiness on feet (R26.81)     Time: 8895-8883 PT Time Calculation (min) (ACUTE ONLY): 12 min  Charges:    $Therapeutic Activity: 8-22 mins                       Isaiah DEL. Leani Myron, PT, DPT   [employer] 10/16/2024, 11:52 AM

## 2024-10-16 NOTE — TOC Transition Note (Signed)
 Transition of Care [facility]) - Discharge Note   Patient Details  Name: Linda Washington MRN: [id number] Date of Birth: November 13, 1950  Transition of [facility]) CM/SW Contact:  Luise JAYSON Pan, LCSWA Phone Number: 10/16/2024, 3:25 PM   Clinical Narrative:   Patient will DC to: [facility] Anticipated DC date: 10/16/2024  Family notified: Left VM for [place],James Son, [phone]   Transport by: [facility]   Per MD patient ready for DC to [facility]. RN to call report prior to discharge [phone], room 207). RN, patient, patient's family, and facility notified of DC. Discharge Summary and FL2 sent to facility. DC packet on chart. Ambulance transport requested for patient 3:20PM.   CSW will sign off for now as social work intervention is no longer needed. Please consult us  again if new needs arise.      Final next level of care: Skilled Nursing Facility Barriers to Discharge: Barriers Resolved   Patient Goals and CMS Choice Patient states their goals for this hospitalization and ongoing recovery are:: To go to rehab CMS Medicare.gov Compare Post Acute Care list provided to:: Patient Choice offered to / list presented to : Patient [place] ownership interest in [facility].provided to:: Patient    Discharge Placement              Patient chooses bed at: [facility] [place] Patient to be transferred to facility by: [facility] Name of family member notified: Call forwarded to VM for pts [facility]  Son,  [phone]) Patient and family notified of of transfer: 10/16/24  Discharge Plan and Services Additional resources added to the After Visit Summary for   In-house Referral: Clinical Social Work Discharge Planning Services: CM Consult Post Acute Care Choice: Skilled Nursing Facility                               Social Drivers of Health (SDOH) Interventions SDOH Screenings   Food Insecurity: No Food Insecurity (10/09/2024)  Housing: Low Risk  (10/09/2024)  Transportation Needs: No Transportation Needs (10/09/2024)  Utilities: Not At Risk (10/09/2024)  Social Connections: Moderately Integrated (10/09/2024)  Tobacco Use: Low Risk (10/14/2024)     Readmission Risk Interventions    10/10/2024    8:37 AM  Readmission Risk Prevention Plan  Post Dischage Appt Complete  Medication Screening Complete  Transportation Screening Complete

## 2024-10-16 NOTE — Progress Notes (Signed)
 Report given to Nurse Mitzie Daring at [facility]. Patient in route to [facility].

## 2024-10-16 NOTE — Discharge Summary (Signed)
 Physician Discharge Summary  Linda Washington FMW:[id number] DOB: 11-21-1950 DOA: 10/08/2024  PCP: Katina Pfeiffer, PA-C  Admit date: 10/08/2024 Discharge date: 10/16/2024  Admitted From: Home Disposition: SNF  Recommendations for Outpatient Follow-up:  Follow up with PCP in 1-2 weeks Follow-up with podiatry as scheduled  Discharge Condition: Stable CODE STATUS: Full Diet recommendation: Low-carb diet  Brief/Interim Summary: Patient 73 year old female history of anxiety, GERD, history of allergies and previous anaphylactic reactions, who presented with hives, swelling, respiratory distress followed by witnessed syncope after eating pancakes and jelly.  Patient received epi, Benadryl .  Patient with low BP subsequently admitted to [facility].  Patient placed on epi gtt., Benadryl , Pepcid , steroids with clinical improvement.  Patient also noted to have a left displaced fibular fracture and seen in consultation by orthopedics who initially recommended splint placement, nonweightbearing with outpatient follow-up.  As patient continued to recover from her anaphylaxis she was reevaluated and underwent ORIF of her left fibular fracture on 12/27.  She tolerated procedure well and pain is now well-controlled on current regimen as below. Given new onset ambulatory dysfunction patient is currently accepted to facility, stable for discharge.  Discharge Diagnoses:  Principal Problem:   Anaphylaxis Active Problems:   Diabetes mellitus (HCC)   Essential hypertension   Dyslipidemia   OSA (obstructive sleep apnea)   Atrial flutter (HCC)   Acquired hypothyroidism   Chronic anticoagulation   Gastroesophageal reflux disease   Mixed anxiety and depressive disorder   Tibia/fibula fracture, left, closed, initial encounter   AKI (acute kidney injury)  Syncope secondary to anaphylactic shock as below, POA, resolved Mechanical fall with trauma Left fibular fracture - Confirmed on imaging - Orthopedics  fixation of left fibular fracture 12/27  -follow-up outpatient with podiatry - Restart Eliquis    Anaphylactic shock, presumably secondary to grape jelly, POA, resolved - Initially requiring epinephrine  drip now discontinued - Prednisone  course to complete 12/26, Pepcid  ongoing - Recommend further outpatient evaluation by allergist given other known comorbid allergic reactions.   GERD -PPI ongoing   Insulin -dependent diabetes mellitus, well-controlled -Hemoglobin A1c noted at 6.8 (09/29/2024) - Patient noted to be on Trulicity 1.5 mg q. weekly, Jardiance  25 mg daily, Lantus 10 units daily, metformin  1000 mg twice daily. - CBG noted at 100 this morning. - Resume home regimen, discussed diabetic diet at length   Hypertension/hyperlipidemia - Continue statin. - Resume home regimen carvedilol .   Sick sinus syndrome status post PPM/A-fib on anticoagulation, stable - Well controlled on carvedilol /amiodarone , sinus rhythm - Apixaban  continued   Reported hypothyroidism - TSH remains within normal limits per chart review going back 6 years - Patient is not on any supplementation, unclear if true hypothyroid diagnosis   AKI, resolved - Renal function improved.   Sacral moisture associated skin damage(MASD) versus pressure injury, POA Patient seen by wound care RN. Wound 10/08/24 1858 Pressure Injury Sacrum Mid Deep Tissue Pressure Injury - Purple or maroon localized area of discolored intact skin or blood-filled blister due to damage of underlying soft tissue from pressure and/or shear. (Active)    Discharge Instructions  Discharge Instructions     Call MD for:  difficulty breathing, headache or visual disturbances   Complete by: As directed    Call MD for:  extreme fatigue   Complete by: As directed    Call MD for:  hives   Complete by: As directed    Call MD for:  persistant dizziness or light-headedness   Complete by: As directed    Call MD for:  persistant nausea and vomiting    Complete by: As directed    Call MD for:  redness, tenderness, or signs of infection (pain, swelling, redness, odor or green/yellow discharge around incision site)   Complete by: As directed    Call MD for:  severe uncontrolled pain   Complete by: As directed    Call MD for:  temperature >100.4   Complete by: As directed    Diet Carb Modified   Complete by: As directed    Discharge wound care:   Complete by: As directed    Cleanse sacral wound with saline, pat dry Apply single layer of xeroform and top with foam. Change daily Notify WOC nursing for acute changes   Increase activity slowly   Complete by: As directed       Allergies as of 10/16/2024       Reactions   Bovine (beef) Protein-containing Drug Products Anaphylaxis   Other Anaphylaxis   Sweet jelly   Cyclobenzaprine Other (See Comments)   Other Reaction(s): makes pt funky headed   Gabapentin  Other (See Comments)   Other Reaction(s): cognitive affect   Hydrocodone Other (See Comments)   Weird sensations mentally; makes me fuzzy   Methocarbamol  Hives, Other (See Comments)   Oxycodone  Other (See Comments)   Weird sensations mentally; makes me fuzzy **Tolerating multiple doses 10/15/24 without issue**   Tinidazole Hives   Latex Hives, Rash        Medication List     STOP taking these medications    traMADol  50 MG tablet Commonly known as: ULTRAM        TAKE these medications    acetaminophen  650 MG CR tablet Commonly known as: TYLENOL  Take 650 mg by mouth every 8 (eight) hours as needed for pain.   amiodarone  200 MG tablet Commonly known as: PACERONE  Take 1 tablet (200 mg total) by mouth daily.   apixaban  5 MG Tabs tablet Commonly known as: ELIQUIS  Take 1 tablet (5 mg total) by mouth 2 (two) times daily.   atorvastatin  40 MG tablet Commonly known as: LIPITOR TAKE 1 TABLET BY MOUTH AT  BEDTIME   carvedilol  6.25 MG tablet Commonly known as: COREG  Take 2 tablets in the AM and 1 tablet in  the PM   cyanocobalamin  1000 MCG tablet Commonly known as: VITAMIN B12 Take 1,000 mcg by mouth daily.   Dexcom G7 Sensor Misc change sensor every 10 days; Duration: 90 days   DULoxetine  30 MG capsule Commonly known as: CYMBALTA  Take 30 mg by mouth daily.   fluticasone  50 MCG/ACT nasal spray Commonly known as: FLONASE  Place 1-2 sprays into both nostrils daily as needed for allergies or rhinitis.   furosemide  40 MG tablet Commonly known as: LASIX  TAKE 1 TABLET EVERY DAY What changed:  when to take this reasons to take this   ibuprofen  800 MG tablet Commonly known as: ADVIL  Take 1 tablet (800 mg total) by mouth every 8 (eight) hours as needed.   insulin  glargine 100 UNIT/ML injection Commonly known as: LANTUS Inject 10 Units into the skin in the morning.   Jardiance  25 MG Tabs tablet Generic drug: empagliflozin  Take 25 mg by mouth daily.   metFORMIN  1000 MG tablet Commonly known as: GLUCOPHAGE  Take 1,000 mg by mouth 2 (two) times daily with a meal.   NovoLOG  FlexPen 100 UNIT/ML FlexPen Generic drug: insulin  aspart Inject 4 Units into the skin daily as needed (blood sugar above 200).   nystatin powder Apply 1 Application topically daily as  needed.   omeprazole 40 MG capsule Commonly known as: PRILOSEC Take 40 mg by mouth daily.   oxybutynin  15 MG 24 hr tablet Commonly known as: DITROPAN  XL Take 15 mg by mouth daily.   oxyCODONE  5 MG immediate release tablet Commonly known as: Roxicodone  Take 1 tablet (5 mg total) by mouth every 4 (four) hours as needed for up to 7 days (pain).   SYSTANE OP Place 1-2 drops into both eyes daily as needed (dry eyes).   Trulicity 1.5 MG/0.5ML Soaj Generic drug: Dulaglutide Inject 1.5 mg into the skin once a week.               Discharge Care Instructions  (From admission, onward)           Start     Ordered   10/16/24 0000  Discharge wound care:       Comments: Cleanse sacral wound with saline, pat dry Apply  single layer of xeroform and top with foam. Change daily Notify WOC nursing for acute changes   10/16/24 1236            Contact information for follow-up providers     Katina Pfeiffer, PA-C. Go on 10/24/2024.   Specialty: Family Medicine Why: @11 :[place] information: [address] [place] [address] [phone]              Contact information for after-discharge care     Destination     [facility]   Service: Skilled Nursing Contact information: [address] [place]  [phone]                    Allergies[1]  Consultations: PCCM, podiatry   Procedures/Studies: DG MINI C-ARM IMAGE ONLY Result Date: 10/14/2024 There is no interpretation for this exam.  This order is for images obtained during a surgical procedure.  Please See Surgeries Tab for more information regarding the procedure.   CT ANKLE LEFT WO CONTRAST Result Date: 10/13/2024 CLINICAL DATA:  Ankle fracture evaluation. EXAM: CT OF THE LEFT ANKLE WITHOUT CONTRAST TECHNIQUE: Multidetector CT imaging of the left ankle was performed according to the standard protocol. Multiplanar CT image reconstructions were also generated. RADIATION DOSE REDUCTION: This exam was performed according to the departmental dose-optimization program which includes automated exposure control, adjustment of the mA and/or kV according to patient size and/or use of iterative reconstruction technique. COMPARISON:  Left ankle radiographs dated 10/09/2024. FINDINGS: Bones/Joint/Cartilage Redemonstrated acute oblique transsyndesmotic fracture of the distal fibular metaphysis with approximately 5 mm of posterior displacement of the distal fracture fragment. Comminuted osseous fragments noted within the fracture defect. Suspect mild asymmetric widening along the lateral aspect of the tibiotalar joint. Punctate linear osseous fragment adjacent to the tip of the  medial malleolus is suspicious for ligamentous injury. No additional fracture identified. Mild degenerative changes throughout the foot with subcortical cystic changes at the base of the second and third metatarsals. Calcaneal enthesopathy at the insertion of the Achilles tendon and the origin of the central cord of the plantar fascia. Ligaments Ligaments are suboptimally evaluated by CT. Muscles and Tendons Fatty atrophy of the intrinsic foot musculature likely reflects chronic denervation changes. The Achilles tendon is intact. Soft tissue Subcutaneous edema along the lateral, anterior and medial ankle. There is a 1.4 x 1.0 by 2.2 cm subcutaneous soft tissue hematoma overlying the medial malleolus with overlying cutaneous thickening. IMPRESSION: 1. Oblique transsyndesmotic fracture of the distal fibular metaphysis with approximately  5 mm of posterior displacement of the distal fracture fragment. 2. Suspect mild asymmetric widening along the lateral aspect of the tibiotalar joint. 3. Punctate linear osseous fragment adjacent to the tip of the medial malleolus is suspicious for ligamentous injury. 4. Subcutaneous edema along the lateral, anterior and medial ankle. 1.4 x 1.0 by 2.2 cm subcutaneous soft tissue hematoma overlying the medial malleolus with overlying cutaneous thickening. 5. Calcaneal enthesopathy. 6. Fatty atrophy of the intrinsic foot musculature likely reflects chronic denervation changes. Electronically Signed   By: Harrietta Sherry M.D.   On: 10/13/2024 14:22   CT HEAD WO CONTRAST ( ) Result Date: 10/09/2024 EXAM: CT HEAD WITHOUT CONTRAST 10/09/2024 12:03:19 PM TECHNIQUE: CT of the head was performed without the administration of intravenous contrast. Automated exposure control, iterative reconstruction, and/or weight based adjustment of the mA/kV was utilized to reduce the radiation dose to as low as reasonably achievable. COMPARISON: CT of the head dated 09/03/2021. CLINICAL HISTORY: fall on  eliquis  FINDINGS: BRAIN AND VENTRICLES: No acute hemorrhage. 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 No evidence of acute infarct. No hydrocephalus. No extra-axial collection. No mass effect or midline shift. There is a partially empty sella. There is calcific atheromatous disease within the carotid siphons. ORBITS: The patient is status post bilateral lens replacement. No acute abnormality. SINUSES: No acute abnormality. SOFT TISSUES AND SKULL: No acute soft tissue abnormality. Hyperostosis frontalis interna. No skull fracture. IMPRESSION: 1. No acute intracranial abnormality related to the fall. 2. 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 3. 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 4. Calcific atheromatous disease within the carotid siphons. Electronically signed by: Evalene Coho MD 10/09/2024 12:20 PM EST RP Workstation: [id number]   DG Ankle Complete Left Result Date: 10/09/2024 EXAM: 3 OR MORE VIEW(S) XRAY OF THE LEFT ANKLE 10/09/2024 02:03:00 AM CLINICAL HISTORY: Fracture [id number] COMPARISON: None available. FINDINGS: BONES AND JOINTS: Acute oblique fracture of the lateral malleolus with mild lateral displacement and slight lateral talar shift. The fracture line extends into the tibiofibular syndesmosis at the level of the ankle mortise. Plantar and dorsal calcaneal spurs. SOFT TISSUES: Diffuse subcutaneous edema. IMPRESSION: 1. Acute oblique fracture of the lateral malleolus. Electronically signed by: Norman Gatlin MD 10/09/2024 02:13 AM EST RP Workstation: [id number]   CT ANGIO LOWER EXT BILAT W &/OR WO CONTRAST Result Date: 10/08/2024 CLINICAL DATA:  Peripheral edema with increased blistering and bruising, initial encounter EXAM: CT ANGIOGRAPHY OF ABDOMINAL AORTA WITH ILIOFEMORAL RUNOFF TECHNIQUE: Multidetector CT imaging of the abdomen, pelvis and lower extremities was performed using the standard protocol during bolus administration of intravenous contrast. Multiplanar CT image  reconstructions and MIPs were obtained to evaluate the vascular anatomy. RADIATION DOSE REDUCTION: This exam was performed according to the departmental dose-optimization program which includes automated exposure control, adjustment of the mA and/or kV according to patient size and/or use of iterative reconstruction technique. CONTRAST:  75mL OMNIPAQUE  IOHEXOL  350 MG/ML SOLN COMPARISON:  None Available. FINDINGS: VASCULAR Aorta: Infrarenal aorta is well visualized. No aneurysmal dilatation or dissection is noted. Celiac: Not included on this exam SMA: Not included on this exam Renals: Not included on this exam IMA: Widely patent RIGHT Lower Extremity Inflow: Right common iliac artery shows no significant atherosclerotic calcifications. Bifurcation into the external and internal iliac arteries is seen. Runoff: Common femoral artery and femoral bifurcation shows no focal abnormality. Superficial femoral artery is widely patent. Popliteal artery and popliteal trifurcation are well visualized. Three-vessel runoff to the right ankle is noted. The anterior tibial  and posterior tibial arteries continue into foot with the posterior tibial artery being the dominant vessel. LEFT Lower Extremity Inflow: Left common iliac artery is widely patent as is the iliac bifurcation. Runoff: Common femoral artery and femoral bifurcation are widely patent. The superficial femoral artery is widely patent without focal stenosis. The popliteal artery is also widely patent. A portion of the popliteal artery is obscured due to the knee replacement hardware although the distal popliteal artery is within normal limits consistent with patency. The popliteal trifurcation is within normal limits. Three-vessel runoff to the left ankle is noted with the posterior tibial artery being the dominant runoff vessel. Veins: No specific venous abnormality is noted. Review of the MIP images confirms the above findings. NON-VASCULAR Lower chest: Not included  on this exam. Hepatobiliary: The liver tip is visualized and within normal limits. Pancreas: Not included on this exam. Spleen: Not included on this exam. Adrenals/Urinary Tract: Adrenal glands are not included. The lower poles of the kidneys show no acute abnormality. No obstructive changes are seen. The bladder is partially distended. Stomach/Bowel: Portions of the large and small bowel are visualized. The appendix is not well visualized. No obstructive or inflammatory changes of the visualized bowel is seen. Lymphatic: No lymphadenopathy is noted. Reproductive: Uterus has been surgically removed. Other: No free pelvic fluid is noted. Musculoskeletal: Osseous structures show degenerative change of the lumbar spine. The lower extremities demonstrate evidence of a left knee prosthesis in satisfactory position. Comminuted fracture of the distal fibula involving the lateral malleolus is seen. Mild displacement at the fracture site is noted. Surrounding soft tissue swelling is noted. The tibia and proximal fibula appear within normal limits. IMPRESSION: VASCULAR No acute vascular abnormality is noted. Three-vessel runoff to the ankles is noted bilaterally. NON-VASCULAR No acute visceral abnormality is noted. Comminuted fracture of the distal left fibula is noted which would account for the soft tissue swelling and bruising. No focal hematoma is noted. Electronically Signed   By: Oneil Devonshire M.D.   On: 10/08/2024 23:14   DG Chest Portable 1 View Result Date: 10/08/2024 CLINICAL DATA:  Shortness of breath. EXAM: PORTABLE CHEST 1 VIEW COMPARISON:  Chest radiograph dated 06/25/2023. FINDINGS: There is mild cardiomegaly with mild vascular congestion. Left lung base atelectasis/scarring. Pneumonia is not excluded. No pleural effusion pneumothorax. Left pectoral pacemaker device. No acute osseous pathology. IMPRESSION: 1. Mild cardiomegaly with mild vascular congestion. 2. Left lung base atelectasis/scarring.  Electronically Signed   By: Vanetta Chou M.D.   On: 10/08/2024 14:54   CUP PACEART REMOTE DEVICE CHECK Result Date: 09/27/2024 Pacemaker: Scheduled remote reviewed. Normal device function.  Presenting rhythm: AP/VS PAF, Eliquis , AF burden 77%, good V-rate control Next remote transmission per protocol. ML, CVRS    Subjective: No acute issues or events denies nausea vomiting diarrhea constipation headache fevers chills or chest pain.  Left leg pain ongoing with markedly improved from previous   Discharge Exam: Vitals:   10/16/24 0713 10/16/24 1053  BP: 124/60 (!) 123/54  Pulse: 60 60  Resp: 16 18  Temp: 97.9 F (36.6 C) 98.3 F (36.8 C)  SpO2: 97% 95%   Vitals:   10/16/24 0300 10/16/24 0627 10/16/24 0713 10/16/24 1053  BP: (!) 109/46 (!) 130/47 124/60 (!) 123/54  Pulse: 60  60 60  Resp: 18  16 18   Temp: 98 F (36.7 C)  97.9 F (36.6 C) 98.3 F (36.8 C)  TempSrc: Oral  Oral Oral  SpO2: 96%  97% 95%  Weight: 126.1  kg     Height: 5' 6 (1.676 m)       General: Pt is alert, awake, not in acute distress Cardiovascular: RRR, S1/S2 +, no rubs, no gallops Respiratory: CTA bilaterally, no wheezing, no rhonchi Abdominal: Soft, NT, ND, bowel sounds + Extremities: Leg bandage clean dry intact    The results of significant diagnostics from this hospitalization (including imaging, microbiology, ancillary and laboratory) are listed below for reference.     Microbiology: Recent Results (from the past 240 hours)  MRSA Next Gen by PCR, Nasal     Status: None   Collection Time: 10/08/24  6:42 PM   Specimen: Nasal Mucosa; Nasal Swab  Result Value Ref Range Status   MRSA by PCR Next Gen NOT DETECTED NOT DETECTED Final    Comment: (NOTE) The GeneXpert MRSA Assay (FDA approved for NASAL specimens only), is one component of a comprehensive MRSA colonization surveillance program. It is not intended to diagnose MRSA infection nor to guide or monitor treatment for MRSA  infections. Test performance is not FDA approved in patients less than 66 years old. Performed at [facility], [address] [place], [place] [address]   Surgical pcr screen     Status: None   Collection Time: 10/13/24  8:57 PM   Specimen: Nasal Mucosa; Nasal Swab  Result Value Ref Range Status   MRSA, PCR NEGATIVE NEGATIVE Final   Staphylococcus aureus NEGATIVE NEGATIVE Final    Comment: (NOTE) The Xpert SA Assay (FDA approved for NASAL specimens in patients 9 years of age and older), is one component of a comprehensive surveillance program. It is not intended to diagnose infection nor to guide or monitor treatment. Performed at [facility], [address], [place], [place] [address]      Labs: BNP (last 3 results) No results for input(s): BNP in the last 8760 hours. Basic Metabolic Panel: Recent Labs  Lab 10/10/24 1028 10/11/24 0257 10/14/24 0407 10/16/24 0431  NA 140 139 140 139  K 3.8 3.8 3.4* 3.7  CL 107 106 105 103  CO2 24 24 27 24   GLUCOSE 117* 115* 117* 122*  BUN 18 20 16 16   CREATININE 0.81 0.79 0.79 0.86  CALCIUM  8.4* 8.7* 8.7* 8.7*  MG 1.8  --   --   --    Liver Function Tests: No results for input(s): AST, ALT, ALKPHOS, BILITOT, PROT, ALBUMIN in the last 168 hours. No results for input(s): LIPASE, AMYLASE in the last 168 hours. No results for input(s): AMMONIA in the last 168 hours. CBC: Recent Labs  Lab 10/10/24 1028 10/11/24 0257 10/14/24 0407 10/16/24 0431  WBC 10.5 10.4 12.4* 12.5*  NEUTROABS 7.5  --   --   --   HGB 11.2* 11.7* 11.6* 12.2  HCT 34.0* 34.6* 35.0* 37.9  MCV 91.9 90.8 90.2 94.0  PLT 276 286 351 349   Cardiac Enzymes: No results for input(s): CKTOTAL, CKMB, CKMBINDEX, TROPONINI in the last 168 hours. BNP: Invalid input(s): POCBNP CBG: Recent Labs  Lab 10/15/24 1122 10/15/24 1607 10/15/24 2137 10/16/24 0541 10/16/24 1049  GLUCAP 127* 168* 180* 139* 180*   D-Dimer No  results for input(s): DDIMER in the last 72 hours. Hgb A1c No results for input(s): HGBA1C in the last 72 hours. Lipid Profile No results for input(s): CHOL, HDL, LDLCALC, TRIG, CHOLHDL, LDLDIRECT in the last 72 hours. Thyroid  function studies No results for input(s): TSH, T4TOTAL, T3FREE, THYROIDAB in the last 72 hours.  Invalid input(s):  FREET3 Anemia work up No results for input(s): VITAMINB12, FOLATE, FERRITIN, TIBC, IRON, RETICCTPCT in the last 72 hours. Urinalysis    Component Value Date/Time   COLORURINE YELLOW 03/28/2015 1404   APPEARANCEUR CLEAR 03/28/2015 1404   LABSPEC 1.024 03/28/2015 1404   PHURINE 5.0 03/28/2015 1404   GLUCOSEU NEGATIVE 03/28/2015 1404   HGBUR TRACE (A) 03/28/2015 1404   BILIRUBINUR NEGATIVE 03/28/2015 1404   KETONESUR NEGATIVE 03/28/2015 1404   PROTEINUR NEGATIVE 03/28/2015 1404   UROBILINOGEN 0.2 03/28/2015 1404   NITRITE NEGATIVE 03/28/2015 1404   LEUKOCYTESUR NEGATIVE 03/28/2015 1404   Sepsis Labs Recent Labs  Lab 10/10/24 1028 10/11/24 0257 10/14/24 0407 10/16/24 0431  WBC 10.5 10.4 12.4* 12.5*   Microbiology Recent Results (from the past 240 hours)  MRSA Next Gen by PCR, Nasal     Status: None   Collection Time: 10/08/24  6:42 PM   Specimen: Nasal Mucosa; Nasal Swab  Result Value Ref Range Status   MRSA by PCR Next Gen NOT DETECTED NOT DETECTED Final    Comment: (NOTE) The GeneXpert MRSA Assay (FDA approved for NASAL specimens only), is one component of a comprehensive MRSA colonization surveillance program. It is not intended to diagnose MRSA infection nor to guide or monitor treatment for MRSA infections. Test performance is not FDA approved in patients less than 47 years old. Performed at [facility], [address], [place], [place] [address]   Surgical pcr screen     Status: None   Collection Time: 10/13/24  8:57 PM   Specimen: Nasal Mucosa; Nasal Swab  Result Value Ref Range  Status   MRSA, PCR NEGATIVE NEGATIVE Final   Staphylococcus aureus NEGATIVE NEGATIVE Final    Comment: (NOTE) The Xpert SA Assay (FDA approved for NASAL specimens in patients 48 years of age and older), is one component of a comprehensive surveillance program. It is not intended to diagnose infection nor to guide or monitor treatment. Performed at [facility], [address], [place], [place] [address]      Time coordinating discharge: Over 30 minutes  SIGNED:   Elsie JAYSON Montclair, DO [facility] 10/16/2024, 12:36 PM Pager   If 7PM-7AM, please contact night-coverage [email or web address]     [1]  Allergies Allergen Reactions   Bovine (Beef) Protein-Containing Drug Products Anaphylaxis   Other Anaphylaxis    Sweet jelly   Cyclobenzaprine Other (See Comments)    Other Reaction(s): makes pt funky headed   Gabapentin  Other (See Comments)    Other Reaction(s): cognitive affect   Hydrocodone Other (See Comments)    Weird sensations mentally; makes me fuzzy   Methocarbamol  Hives and Other (See Comments)   Oxycodone  Other (See Comments)    Weird sensations mentally; makes me fuzzy **Tolerating multiple doses 10/15/24 without issue**   Tinidazole Hives   Latex Hives and Rash

## 2024-10-16 NOTE — Discharge Instructions (Signed)
 Lillia Mountain, MD [facility]  Please read the following information regarding your care after surgery.  Resume all other routine medications per usual or as directed by your PCP/other specialists.  Weight Bearing ? Do NOT bear any weight on the operated leg or foot. This means do NOT touch your surgical leg to the ground!  Cast / Splint / Dressing ? If you have a splint, do NOT remove this. Keep your splint, cast or dressing clean and dry.  Dont put anything (coat hanger, pencil, etc) down inside of it.  If it gets wet, call the office immediately to schedule an appointment for a cast change.  Swelling IMPORTANT: It is normal for you to have swelling where you had surgery. To reduce swelling and pain, keep at least 3 pillows under your leg so that your toes are above your nose and your heel is above the level of your hip.  It may be necessary to keep your foot or leg elevated for several weeks.  This is critical to helping your incisions heal and your pain to feel better.  Follow Up Call my office at [phone] when you are discharged from the hospital or surgery center to schedule an appointment to be seen 7-10 days after surgery.  Call my office at [phone] if you develop a fever >101.5 F, nausea, vomiting, bleeding from the surgical site or severe pain.

## 2024-10-16 NOTE — TOC Progression Note (Addendum)
 Transition of Care [facility]) - Progression Note    Patient Details  Name: Linda Washington MRN: [id number] Date of Birth: [birth year]  [facility] CM/SW Contact  Luise JAYSON Pan, [place] Phone Number: 10/16/2024, 12:00 PM  Clinical Narrative:   [facility] can accept patient today. CSW notified bedside RN and MD. Per MD, awaiting ortho to round/sign off. Per bedside RN, patient to get boot (put on) and scooter today. CSW inquired with patient if she has family that can pick up the scooter, patient stated she can ask her son.   3:11 PM CSW placed patient on will call list for [facility]. CSW to provided bedside RN number.  CSW secure chatted ortho physician to inquire when patient will receive her boot and scooter to discharge to SNF. CSW awaiting response.   CSW will continue to follow.    Expected Discharge Plan: Skilled Nursing Facility Barriers to Discharge: Continued Medical Work up, SNF Pending bed offer, English As A Second Language Teacher               Expected Discharge Plan and Services In-house Referral: Clinical Social Work Discharge Planning Services: CM Consult Post Acute Care Choice: Skilled Nursing Facility Living arrangements for the past 2 months: Single Family Home                                       Social Drivers of Health (SDOH) Interventions SDOH Screenings   Food Insecurity: No Food Insecurity (10/09/2024)  Housing: Low Risk (10/09/2024)  Transportation Needs: No Transportation Needs (10/09/2024)  Utilities: Not At Risk (10/09/2024)  Social Connections: Moderately Integrated (10/09/2024)  Tobacco Use: Low Risk (10/14/2024)    Readmission Risk Interventions    10/10/2024    8:37 AM  Readmission Risk Prevention Plan  Post Dischage Appt Complete  Medication Screening Complete  Transportation Screening Complete

## 2024-10-17 NOTE — Progress Notes (Signed)
 Eulamae Greenstein                                          MRN: [id number]   10/17/2024   The [facility]BCI Quality Team Specialist reviewed this patient medical record for the purposes of chart review for care gap closure. The following were reviewed: chart review for care gap closure-kidney health evaluation for diabetes:eGFR  and uACR.    VBCI Quality Team

## 2024-10-25 ENCOUNTER — Other Ambulatory Visit

## 2024-10-27 ENCOUNTER — Ambulatory Visit: Admitting: Podiatry

## 2024-11-08 ENCOUNTER — Ambulatory Visit: Admitting: Pulmonary Disease

## 2024-11-08 ENCOUNTER — Encounter: Payer: Self-pay | Admitting: Pulmonary Disease

## 2024-11-08 VITALS — BP 122/74 | HR 62 | Temp 98.0°F | Ht 66.0 in | Wt 270.0 lb

## 2024-11-08 DIAGNOSIS — J45909 Unspecified asthma, uncomplicated: Secondary | ICD-10-CM

## 2024-11-08 DIAGNOSIS — G4730 Sleep apnea, unspecified: Secondary | ICD-10-CM | POA: Diagnosis not present

## 2024-11-08 DIAGNOSIS — J4540 Moderate persistent asthma, uncomplicated: Secondary | ICD-10-CM

## 2024-11-08 DIAGNOSIS — G4733 Obstructive sleep apnea (adult) (pediatric): Secondary | ICD-10-CM

## 2024-11-08 DIAGNOSIS — R911 Solitary pulmonary nodule: Secondary | ICD-10-CM | POA: Diagnosis not present

## 2024-11-08 DIAGNOSIS — R059 Cough, unspecified: Secondary | ICD-10-CM

## 2024-11-08 DIAGNOSIS — R0602 Shortness of breath: Secondary | ICD-10-CM

## 2024-11-08 NOTE — Patient Instructions (Signed)
" °  VISIT SUMMARY: Today, you had a follow-up appointment to review your asthma and other recent health issues. Your asthma is well-controlled, and your recent lung function tests were normal except for changes consistent with asthma. We also discussed your stable pulmonary nodule, recent hospitalization for anaphylactic shock, fibular fracture, and postoperative status from hernia repair and cholecystectomy.  YOUR PLAN: ASTHMA: Your asthma is well-controlled with your current medications, and your recent lung function tests showed no acute issues. -Continue using Symbicort  and [place] for asthma management.  STABLE PULMONARY NODULE: Your pulmonary nodule is stable and appears benign. There is no high risk for malignancy since you have never smoked. -We will order a CT scan of your chest in six months to monitor the pulmonary nodule.    Contains text generated by [employer]bridge.   "

## 2024-11-08 NOTE — Progress Notes (Signed)
 "              Linda Washington    [id number]    1951/05/14  Primary Care Physician:Wharton, Charmaine RIGGERS  Referring Physician: Katina Charmaine, PA-C [address] [place],  [place] [address]  Chief complaint: Follow-up for chronic cough  HPI: 74 y.o. who  has a past medical history of Acute on chronic diastolic (congestive) heart failure (HCC) (06/2018), Anxiety, Arthritis, Asthma, Basal cell carcinoma, Dyspnea, GERD (gastroesophageal reflux disease), History of Bell's palsy, History of bronchitis, History of hiatal hernia, History of kidney stones, Hyperlipemia, Hypertension, Hypothyroidism, Incontinence, Joint swelling, Neuropathy, PONV (postoperative nausea and vomiting), Presence of permanent cardiac pacemaker, RLS (restless legs syndrome), Seasonal allergies, Sleep apnea, Tremor, and Type 2 diabetes mellitus (HCC).   Here for evaluation of cough with occasional green mucus, dyspnea.  Denies any wheeze, fevers, chills.  Symptoms started after she was diagnosed and treated with pneumonia as an outpatient with doxycycline in October 2023.  Has history of seasonal allergies, OSA noncompliant with CPAP.  History notable for a fall while on Coumadin  in 2022 with left rib fractures and hemothorax which was treated conservatively. She has been diagnosed with mild sleep apnea in 2025 and was prescribed a CPAP machine, which she reports is not helpful as it wakes her up. She is considering other treatment options for her sleep apnea.   Interim history: Discussed the use of AI scribe software for clinical note transcription with the patient, who gave verbal consent to proceed.  History of Present Illness Linda Washington is a 74 year old female with asthma who presents for follow-up of her pulmonary condition.  Asthma and pulmonary symptoms - Uses Symbicort  for asthma management. - Shortness of breath occurs only with exercise and resolves quickly. - Recent lung function testing showed  normal results except for changes consistent with asthma. - No significant respiratory symptoms during recent surgeries.  Pulmonary nodule - Stable lung nodule on recent imaging.  Recent hospitalization and anaphylactic shock - Hospitalized on October 08, 2024 for anaphylactic shock of unclear etiology. - No significant breathing problems during or after hospitalization.  Musculoskeletal injury - Sustained fibular fracture during recent hospitalization. - Fracture required surgical intervention and is currently immobilized in a cast.  Postoperative status - Underwent hernia repair and cholecystectomy on October 02, 2024. - Umbilical hernia repair is healing and requires a bandage.   Relevant pulmonary history Pets: Cats Occupation: Retired Psychologist, Clinical Exposures: No mold, hot tub, Jacuzzi.  No feather pillows or comforters Smoking history: Never smoker Travel history: Previously lived in [place]  and [place]  Relevant family history: No significant family history of lung disease  Outpatient Encounter Medications as of 11/08/2024  Medication Sig   acetaminophen  (TYLENOL ) 650 MG CR tablet Take 650 mg by mouth every 8 (eight) hours as needed for pain.   amiodarone  (PACERONE ) 200 MG tablet Take 1 tablet (200 mg total) by mouth daily.   apixaban  (ELIQUIS ) 5 MG TABS tablet Take 1 tablet (5 mg total) by mouth 2 (two) times daily.   atorvastatin  (LIPITOR) 40 MG tablet TAKE 1 TABLET BY MOUTH AT  BEDTIME   carvedilol  (COREG ) 6.25 MG tablet Take 2 tablets in the AM and 1 tablet in the PM   Continuous Glucose Sensor (DEXCOM G7 SENSOR) MISC change sensor every 10 days; Duration: 90 days   cyanocobalamin  (VITAMIN B12) 1000 MCG tablet Take 1,000 mcg by mouth daily.   Dulaglutide (TRULICITY) 1.5 MG/0.5ML SOAJ Inject 1.5 mg into  the skin once a week.   DULoxetine  (CYMBALTA ) 30 MG capsule Take 30 mg by mouth daily.   empagliflozin  (JARDIANCE ) 25 MG TABS tablet Take 25 mg by mouth daily.    fluticasone  (FLONASE ) 50 MCG/ACT nasal spray Place 1-2 sprays into both nostrils daily as needed for allergies or rhinitis.   furosemide  (LASIX ) 40 MG tablet TAKE 1 TABLET EVERY DAY   ibuprofen  (ADVIL ) 800 MG tablet Take 1 tablet (800 mg total) by mouth every 8 (eight) hours as needed.   insulin  glargine (LANTUS) 100 UNIT/ML injection Inject 10 Units into the skin in the morning.   metFORMIN  (GLUCOPHAGE ) 1000 MG tablet Take 1,000 mg by mouth 2 (two) times daily with a meal.   NOVOLOG  FLEXPEN 100 UNIT/ML FlexPen Inject 4 Units into the skin daily as needed (blood sugar above 200).   omeprazole (PRILOSEC) 40 MG capsule Take 40 mg by mouth daily.   oxybutynin  (DITROPAN  XL) 15 MG 24 hr tablet Take 15 mg by mouth daily.   Polyethyl Glycol-Propyl Glycol (SYSTANE OP) Place 1-2 drops into both eyes daily as needed (dry eyes).   nystatin powder Apply 1 Application topically daily as needed. (Patient not taking: Reported on 11/08/2024)   Facility-Administered Encounter Medications as of 11/08/2024  Medication   betamethasone  acetate-betamethasone  sodium phosphate (CELESTONE ) injection 3 mg   Vitals:   11/08/24 0940  BP: (!) 119/58  Pulse: 62  Temp: 98 F (36.7 C)  Height: 5' 6 (1.676 m)  Weight: 270 lb (122.5 kg)  SpO2: 96%  TempSrc: Oral  BMI (Calculated): 43.6     Physical Exam GEN: No acute distress CV: Regular rate and rhythm no murmurs LUNGS: Clear to auscultation bilaterally normal respiratory effort SKIN JOINTS: Warm and dry no rash.  Right leg in a cast    Data Reviewed: Imaging: CT chest 09/03/2021-left rib fractures, moderate left hemithorax with consolidative changes in the left lower lobe.  Chest x-ray 12/08/2022-no active cardiopulmonary disease.  CT high-resolution 03/23/2023-nodular focus of consolidation in the left lower lobe, mild patchy air trapping.  No evidence of interstitial lung disease.  CT high resolution 09/23/2023-no clear evidence of interstitial lung disease,  mild air trapping, focus of nodular consolidation/atelectatic scarring in the left lower lobe which is stable.  Chest x-ray 10/08/2024-mild cardiomegaly with mild vascular congestion.  Left base atelectasis/scarring. I have reviewed the images personally.  PFTs: 09/27/2024 FVC 2.36 [76%), FEV1 2.02 [87%], F/F86, TLC 4.80 [89%], DLCO 24.15 [119%] Normal test  Labs: CBC differential 12/14/2022- WBC 9.2 eos 2.7%, absolute eosinophil count 248 IgE 12/14/2022- 66   Assessment & Plan Asthma Well-controlled with current treatment. Lung function tests show subtle changes consistent with known asthma, but no acute issues. No breathing difficulties reported despite recent multiple surgeries. - Continue Symbicort  asthma management.  Stable pulmonary nodule Pulmonary nodule is stable and appears benign. No high risk for malignancy due to never-smoker status. CT scan deferred for six months due to recent surgeries and stable condition. - Will order CT scan of the chest in six months to monitor pulmonary nodule.   Sleep Apnea Difficulty tolerating CPAP. Mild sleep apnea per previous assessment. -Recommended follow-up with cardiologist for potential alternative treatments.  General Health Maintenance / Followup Plans -Renew Symbicort  prescription. -Follow-up visit in 6 months  Plan/Recommendations: Symbicort  with spacer CT in 6 months  Lonna Coder MD [facility] 11/08/2024, 9:51 AM  CC: Katina Pfeiffer, PA-C    "
